# Patient Record
Sex: Female | Born: 1940 | Race: Black or African American | Hispanic: No | Marital: Single | Smoking: Former smoker
Health system: Southern US, Community
[De-identification: ages and names within clinical notes are randomized; demographics above are authoritative.]

## PROBLEM LIST (undated history)

## (undated) DIAGNOSIS — E785 Hyperlipidemia, unspecified: Secondary | ICD-10-CM

## (undated) DIAGNOSIS — I219 Acute myocardial infarction, unspecified: Secondary | ICD-10-CM

## (undated) DIAGNOSIS — I10 Essential (primary) hypertension: Secondary | ICD-10-CM

## (undated) DIAGNOSIS — R911 Solitary pulmonary nodule: Secondary | ICD-10-CM

## (undated) DIAGNOSIS — I2699 Other pulmonary embolism without acute cor pulmonale: Secondary | ICD-10-CM

## (undated) DIAGNOSIS — M1990 Unspecified osteoarthritis, unspecified site: Secondary | ICD-10-CM

## (undated) DIAGNOSIS — D509 Iron deficiency anemia, unspecified: Secondary | ICD-10-CM

## (undated) DIAGNOSIS — K5790 Diverticulosis of intestine, part unspecified, without perforation or abscess without bleeding: Secondary | ICD-10-CM

## (undated) DIAGNOSIS — F5083 Pica in adults: Secondary | ICD-10-CM

## (undated) DIAGNOSIS — N189 Chronic kidney disease, unspecified: Secondary | ICD-10-CM

## (undated) DIAGNOSIS — F5089 Other specified eating disorder: Secondary | ICD-10-CM

## (undated) DIAGNOSIS — G4730 Sleep apnea, unspecified: Secondary | ICD-10-CM

## (undated) DIAGNOSIS — E669 Obesity, unspecified: Secondary | ICD-10-CM

## (undated) DIAGNOSIS — K56609 Unspecified intestinal obstruction, unspecified as to partial versus complete obstruction: Secondary | ICD-10-CM

## (undated) DIAGNOSIS — N261 Atrophy of kidney (terminal): Secondary | ICD-10-CM

## (undated) HISTORY — DX: Diverticulosis of intestine, part unspecified, without perforation or abscess without bleeding: K57.90

## (undated) HISTORY — PX: OTHER SURGICAL HISTORY: SHX169

## (undated) HISTORY — DX: Unspecified intestinal obstruction, unspecified as to partial versus complete obstruction: K56.609

## (undated) HISTORY — DX: Other pulmonary embolism without acute cor pulmonale: I26.99

## (undated) HISTORY — DX: Hyperlipidemia, unspecified: E78.5

## (undated) HISTORY — PX: KIDNEY CYST REMOVAL: SHX684

## (undated) HISTORY — DX: Obesity, unspecified: E66.9

## (undated) HISTORY — DX: Other specified eating disorder: F50.89

## (undated) HISTORY — DX: Atrophy of kidney (terminal): N26.1

## (undated) HISTORY — DX: Essential (primary) hypertension: I10

## (undated) HISTORY — DX: Acute myocardial infarction, unspecified: I21.9

## (undated) HISTORY — DX: Sleep apnea, unspecified: G47.30

## (undated) HISTORY — PX: DILATION AND CURETTAGE OF UTERUS: SHX78

## (undated) HISTORY — DX: Iron deficiency anemia, unspecified: D50.9

## (undated) HISTORY — DX: Solitary pulmonary nodule: R91.1

## (undated) HISTORY — DX: Pica in adults: F50.83

## (undated) HISTORY — PX: TUBAL LIGATION: SHX77

---

## 2000-05-28 ENCOUNTER — Encounter: Admission: RE | Admit: 2000-05-28 | Discharge: 2000-05-28 | Payer: Self-pay | Admitting: Hematology and Oncology

## 2000-06-18 ENCOUNTER — Ambulatory Visit (HOSPITAL_COMMUNITY): Admission: RE | Admit: 2000-06-18 | Discharge: 2000-06-18 | Payer: Self-pay

## 2000-06-30 ENCOUNTER — Ambulatory Visit (HOSPITAL_COMMUNITY): Admission: RE | Admit: 2000-06-30 | Discharge: 2000-06-30 | Payer: Self-pay | Admitting: Internal Medicine

## 2000-07-02 ENCOUNTER — Encounter: Admission: RE | Admit: 2000-07-02 | Discharge: 2000-07-02 | Payer: Self-pay | Admitting: Internal Medicine

## 2001-07-13 ENCOUNTER — Encounter: Payer: Self-pay | Admitting: Internal Medicine

## 2001-07-13 ENCOUNTER — Encounter: Admission: RE | Admit: 2001-07-13 | Discharge: 2001-07-13 | Payer: Self-pay | Admitting: Internal Medicine

## 2001-07-18 ENCOUNTER — Encounter: Admission: RE | Admit: 2001-07-18 | Discharge: 2001-07-18 | Payer: Self-pay | Admitting: Internal Medicine

## 2001-07-18 ENCOUNTER — Encounter: Payer: Self-pay | Admitting: Internal Medicine

## 2001-07-31 ENCOUNTER — Emergency Department (HOSPITAL_COMMUNITY): Admission: EM | Admit: 2001-07-31 | Discharge: 2001-07-31 | Payer: Self-pay | Admitting: *Deleted

## 2001-07-31 ENCOUNTER — Encounter: Payer: Self-pay | Admitting: Emergency Medicine

## 2002-05-07 ENCOUNTER — Observation Stay (HOSPITAL_COMMUNITY): Admission: EM | Admit: 2002-05-07 | Discharge: 2002-05-08 | Payer: Self-pay | Admitting: Urology

## 2002-05-07 ENCOUNTER — Encounter: Payer: Self-pay | Admitting: Urology

## 2002-05-07 ENCOUNTER — Encounter: Payer: Self-pay | Admitting: Emergency Medicine

## 2002-05-08 ENCOUNTER — Encounter: Payer: Self-pay | Admitting: Urology

## 2002-07-14 ENCOUNTER — Encounter: Payer: Self-pay | Admitting: Internal Medicine

## 2002-07-14 ENCOUNTER — Encounter: Admission: RE | Admit: 2002-07-14 | Discharge: 2002-07-14 | Payer: Self-pay | Admitting: Internal Medicine

## 2003-07-17 ENCOUNTER — Encounter: Admission: RE | Admit: 2003-07-17 | Discharge: 2003-07-17 | Payer: Self-pay | Admitting: Internal Medicine

## 2003-12-04 ENCOUNTER — Emergency Department (HOSPITAL_COMMUNITY): Admission: EM | Admit: 2003-12-04 | Discharge: 2003-12-04 | Payer: Self-pay | Admitting: Emergency Medicine

## 2004-01-15 ENCOUNTER — Emergency Department (HOSPITAL_COMMUNITY): Admission: EM | Admit: 2004-01-15 | Discharge: 2004-01-15 | Payer: Self-pay | Admitting: Family Medicine

## 2004-02-26 ENCOUNTER — Encounter: Admission: RE | Admit: 2004-02-26 | Discharge: 2004-02-26 | Payer: Self-pay | Admitting: Internal Medicine

## 2004-03-14 ENCOUNTER — Encounter: Admission: RE | Admit: 2004-03-14 | Discharge: 2004-03-14 | Payer: Self-pay | Admitting: Internal Medicine

## 2004-03-28 ENCOUNTER — Encounter: Admission: RE | Admit: 2004-03-28 | Discharge: 2004-03-28 | Payer: Self-pay | Admitting: Internal Medicine

## 2004-03-28 ENCOUNTER — Ambulatory Visit (HOSPITAL_COMMUNITY): Admission: RE | Admit: 2004-03-28 | Discharge: 2004-03-28 | Payer: Self-pay | Admitting: Internal Medicine

## 2004-05-05 ENCOUNTER — Ambulatory Visit: Payer: Self-pay | Admitting: Internal Medicine

## 2004-05-20 ENCOUNTER — Ambulatory Visit: Payer: Self-pay | Admitting: Internal Medicine

## 2004-05-26 ENCOUNTER — Ambulatory Visit: Payer: Self-pay | Admitting: Internal Medicine

## 2004-08-06 ENCOUNTER — Ambulatory Visit (HOSPITAL_COMMUNITY): Admission: RE | Admit: 2004-08-06 | Discharge: 2004-08-06 | Payer: Self-pay | Admitting: Internal Medicine

## 2004-08-06 ENCOUNTER — Ambulatory Visit: Payer: Self-pay | Admitting: Internal Medicine

## 2004-08-08 ENCOUNTER — Encounter: Admission: RE | Admit: 2004-08-08 | Discharge: 2004-08-08 | Payer: Self-pay | Admitting: Sports Medicine

## 2004-08-13 ENCOUNTER — Ambulatory Visit: Payer: Self-pay | Admitting: Internal Medicine

## 2004-08-15 ENCOUNTER — Ambulatory Visit: Payer: Self-pay | Admitting: Internal Medicine

## 2004-08-20 ENCOUNTER — Ambulatory Visit: Payer: Self-pay | Admitting: Internal Medicine

## 2004-08-25 ENCOUNTER — Ambulatory Visit: Payer: Self-pay | Admitting: Internal Medicine

## 2004-09-25 ENCOUNTER — Observation Stay (HOSPITAL_COMMUNITY): Admission: EM | Admit: 2004-09-25 | Discharge: 2004-09-26 | Payer: Self-pay | Admitting: Emergency Medicine

## 2004-09-25 ENCOUNTER — Ambulatory Visit: Payer: Self-pay | Admitting: Internal Medicine

## 2004-10-07 ENCOUNTER — Ambulatory Visit: Payer: Self-pay | Admitting: Internal Medicine

## 2004-10-22 ENCOUNTER — Ambulatory Visit: Payer: Self-pay | Admitting: Internal Medicine

## 2005-02-06 ENCOUNTER — Ambulatory Visit: Payer: Self-pay | Admitting: Internal Medicine

## 2005-04-27 ENCOUNTER — Ambulatory Visit: Payer: Self-pay | Admitting: Internal Medicine

## 2005-04-27 ENCOUNTER — Encounter (INDEPENDENT_AMBULATORY_CARE_PROVIDER_SITE_OTHER): Payer: Self-pay | Admitting: Internal Medicine

## 2005-04-27 LAB — CONVERTED CEMR LAB: Pap Smear: NORMAL

## 2005-05-07 ENCOUNTER — Ambulatory Visit: Payer: Self-pay | Admitting: Hospitalist

## 2005-08-18 ENCOUNTER — Ambulatory Visit: Payer: Self-pay | Admitting: Internal Medicine

## 2005-10-27 ENCOUNTER — Encounter: Admission: RE | Admit: 2005-10-27 | Discharge: 2005-10-27 | Payer: Self-pay | Admitting: Internal Medicine

## 2005-11-03 ENCOUNTER — Ambulatory Visit: Payer: Self-pay | Admitting: Internal Medicine

## 2005-11-23 ENCOUNTER — Ambulatory Visit: Payer: Self-pay | Admitting: Internal Medicine

## 2005-12-04 ENCOUNTER — Ambulatory Visit: Payer: Self-pay | Admitting: Internal Medicine

## 2005-12-16 ENCOUNTER — Ambulatory Visit: Payer: Self-pay | Admitting: Internal Medicine

## 2006-04-30 ENCOUNTER — Ambulatory Visit: Payer: Self-pay | Admitting: Internal Medicine

## 2006-05-03 ENCOUNTER — Ambulatory Visit: Payer: Self-pay | Admitting: Internal Medicine

## 2006-05-12 ENCOUNTER — Ambulatory Visit: Payer: Self-pay | Admitting: Internal Medicine

## 2006-08-13 ENCOUNTER — Encounter (INDEPENDENT_AMBULATORY_CARE_PROVIDER_SITE_OTHER): Payer: Self-pay | Admitting: Internal Medicine

## 2006-08-13 DIAGNOSIS — I10 Essential (primary) hypertension: Secondary | ICD-10-CM | POA: Insufficient documentation

## 2006-08-13 DIAGNOSIS — E213 Hyperparathyroidism, unspecified: Secondary | ICD-10-CM

## 2006-08-13 DIAGNOSIS — E785 Hyperlipidemia, unspecified: Secondary | ICD-10-CM

## 2006-08-18 ENCOUNTER — Ambulatory Visit (HOSPITAL_COMMUNITY): Admission: RE | Admit: 2006-08-18 | Discharge: 2006-08-18 | Payer: Self-pay | Admitting: Internal Medicine

## 2006-08-18 ENCOUNTER — Ambulatory Visit: Payer: Self-pay | Admitting: Internal Medicine

## 2006-08-18 ENCOUNTER — Encounter: Payer: Self-pay | Admitting: Internal Medicine

## 2006-08-18 ENCOUNTER — Encounter (INDEPENDENT_AMBULATORY_CARE_PROVIDER_SITE_OTHER): Payer: Self-pay | Admitting: Internal Medicine

## 2006-08-18 LAB — CONVERTED CEMR LAB
Basophils Absolute: 0 10*3/uL (ref 0.0–0.1)
Basophils Relative: 0 % (ref 0–1)
Eosinophils Relative: 3 % (ref 0–5)
HCT: 40.1 % (ref 36.0–46.0)
Hemoglobin: 13.2 g/dL (ref 12.0–15.0)
Lymphocytes Relative: 30 % (ref 12–46)
Lymphs Abs: 3.1 10*3/uL (ref 0.7–3.3)
MCHC: 32.9 g/dL (ref 30.0–36.0)
MCV: 92 fL (ref 78.0–100.0)
Monocytes Absolute: 0.8 10*3/uL — ABNORMAL HIGH (ref 0.2–0.7)
Monocytes Relative: 8 % (ref 3–11)
Neutro Abs: 6 10*3/uL (ref 1.7–7.7)
Neutrophils Relative %: 59 % (ref 43–77)
Platelets: 291 10*3/uL (ref 150–400)
RBC: 4.36 M/uL (ref 3.87–5.11)
RDW: 14.1 % — ABNORMAL HIGH (ref 11.5–14.0)
WBC: 10.2 10*3/uL (ref 4.0–10.5)

## 2006-08-19 LAB — CONVERTED CEMR LAB
ALT: 20 units/L (ref 0–35)
AST: 25 units/L (ref 0–37)
Albumin: 4 g/dL (ref 3.5–5.2)
Alkaline Phosphatase: 105 units/L (ref 39–117)
BUN: 6 mg/dL (ref 6–23)
CO2: 31 meq/L (ref 19–32)
Calcium: 10.5 mg/dL (ref 8.4–10.5)
Chloride: 103 meq/L (ref 96–112)
Cholesterol: 244 mg/dL — ABNORMAL HIGH (ref 0–200)
Creatinine, Ser: 0.74 mg/dL (ref 0.40–1.20)
Glucose, Bld: 102 mg/dL — ABNORMAL HIGH (ref 70–99)
HDL: 51 mg/dL (ref >39)
LDL Cholesterol: 175 mg/dL — ABNORMAL HIGH (ref 0–99)
Potassium: 4.2 meq/L (ref 3.5–5.3)
Sodium: 142 meq/L (ref 135–145)
Total Bilirubin: 0.6 mg/dL (ref 0.3–1.2)
Total CHOL/HDL Ratio: 4.8
Total Protein: 7.5 g/dL (ref 6.0–8.3)
Triglycerides: 89 mg/dL (ref <150)
VLDL: 18 mg/dL (ref 0–40)

## 2006-08-23 ENCOUNTER — Ambulatory Visit: Payer: Self-pay | Admitting: Internal Medicine

## 2006-08-23 DIAGNOSIS — R7989 Other specified abnormal findings of blood chemistry: Secondary | ICD-10-CM | POA: Insufficient documentation

## 2006-10-21 ENCOUNTER — Emergency Department (HOSPITAL_COMMUNITY): Admission: EM | Admit: 2006-10-21 | Discharge: 2006-10-21 | Payer: Self-pay | Admitting: Family Medicine

## 2006-11-19 ENCOUNTER — Encounter: Admission: RE | Admit: 2006-11-19 | Discharge: 2006-11-19 | Payer: Self-pay | Admitting: Internal Medicine

## 2007-06-08 ENCOUNTER — Ambulatory Visit: Payer: Self-pay | Admitting: Internal Medicine

## 2007-06-08 ENCOUNTER — Encounter (INDEPENDENT_AMBULATORY_CARE_PROVIDER_SITE_OTHER): Payer: Self-pay | Admitting: *Deleted

## 2007-06-08 LAB — CONVERTED CEMR LAB
Calcium, Total (PTH): 10.8 mg/dL — ABNORMAL HIGH (ref 8.4–10.5)
PTH: 73.2 pg/mL — ABNORMAL HIGH (ref 14.0–72.0)

## 2007-06-13 LAB — CONVERTED CEMR LAB
ALT: 16 units/L (ref 0–35)
AST: 21 units/L (ref 0–37)
Albumin: 4.2 g/dL (ref 3.5–5.2)
Alkaline Phosphatase: 95 units/L (ref 39–117)
Amphetamine Screen, Ur: NEGATIVE
BUN: 14 mg/dL (ref 6–23)
Barbiturate Quant, Ur: NEGATIVE
Basophils Absolute: 0.1 10*3/uL (ref 0.0–0.1)
Basophils Relative: 1 % (ref 0–1)
Benzodiazepines.: NEGATIVE
Bilirubin Urine: NEGATIVE
CO2: 29 meq/L (ref 19–32)
Calcium: 10.4 mg/dL (ref 8.4–10.5)
Chloride: 102 meq/L (ref 96–112)
Cholesterol: 216 mg/dL — ABNORMAL HIGH (ref 0–200)
Cocaine Metabolites: NEGATIVE
Creatinine, Ser: 0.69 mg/dL (ref 0.40–1.20)
Creatinine, Urine: 98.8 mg/dL
Creatinine,U: 97.6 mg/dL
Eosinophils Absolute: 0.2 10*3/uL (ref 0.0–0.7)
Eosinophils Relative: 3 % (ref 0–5)
Ferritin: 425 ng/mL — ABNORMAL HIGH (ref 10–291)
Free T4: 1.28 ng/dL (ref 0.89–1.80)
Glucose, Bld: 80 mg/dL (ref 70–99)
HCT: 43.5 % (ref 36.0–46.0)
HDL: 61 mg/dL (ref >39)
Hemoglobin: 14.1 g/dL (ref 12.0–15.0)
LDL Cholesterol: 142 mg/dL — ABNORMAL HIGH (ref 0–99)
Lymphocytes Relative: 37 % (ref 12–46)
Lymphs Abs: 2.9 10*3/uL (ref 0.7–3.3)
MCHC: 32.4 g/dL (ref 30.0–36.0)
MCV: 92.8 fL (ref 78.0–100.0)
Marijuana Metabolite: NEGATIVE
Methadone: NEGATIVE
Microalb Creat Ratio: 1295.5 mg/g — ABNORMAL HIGH (ref 0.0–30.0)
Microalb, Ur: 128 mg/dL — ABNORMAL HIGH (ref 0.00–1.89)
Monocytes Absolute: 0.6 10*3/uL (ref 0.2–0.7)
Monocytes Relative: 7 % (ref 3–11)
Neutro Abs: 4.1 10*3/uL (ref 1.7–7.7)
Neutrophils Relative %: 53 % (ref 43–77)
Nitrite: POSITIVE — AB
Opiates: NEGATIVE
Phencyclidine (PCP): NEGATIVE
Platelets: 241 10*3/uL (ref 150–400)
Potassium: 4.1 meq/L (ref 3.5–5.3)
Propoxyphene: NEGATIVE
Protein, ur: >300 mg/dL — AB
RBC: 4.69 M/uL (ref 3.87–5.11)
RDW: 14.6 % — ABNORMAL HIGH (ref 11.5–14.0)
Sodium: 141 meq/L (ref 135–145)
Specific Gravity, Urine: 1.02 (ref 1.005–1.03)
TSH: 1.316 microintl units/mL (ref 0.350–5.50)
Total Bilirubin: 0.5 mg/dL (ref 0.3–1.2)
Total CHOL/HDL Ratio: 3.5
Total Protein: 7.3 g/dL (ref 6.0–8.3)
Triglycerides: 67 mg/dL (ref <150)
Urine Glucose: NEGATIVE mg/dL
Urobilinogen, UA: 0.2 (ref 0.0–1.0)
VLDL: 13 mg/dL (ref 0–40)
WBC: 7.8 10*3/uL (ref 4.0–10.5)
pH: 7 (ref 5.0–8.0)

## 2007-06-15 ENCOUNTER — Encounter (INDEPENDENT_AMBULATORY_CARE_PROVIDER_SITE_OTHER): Payer: Self-pay | Admitting: *Deleted

## 2007-06-15 ENCOUNTER — Ambulatory Visit: Payer: Self-pay | Admitting: Internal Medicine

## 2007-10-22 ENCOUNTER — Encounter: Payer: Self-pay | Admitting: Internal Medicine

## 2008-01-27 ENCOUNTER — Encounter: Admission: RE | Admit: 2008-01-27 | Discharge: 2008-01-27 | Payer: Self-pay | Admitting: Internal Medicine

## 2008-01-31 ENCOUNTER — Encounter: Payer: Self-pay | Admitting: Internal Medicine

## 2008-02-07 ENCOUNTER — Ambulatory Visit: Payer: Self-pay | Admitting: *Deleted

## 2008-02-07 ENCOUNTER — Ambulatory Visit (HOSPITAL_COMMUNITY): Admission: RE | Admit: 2008-02-07 | Discharge: 2008-02-07 | Payer: Self-pay | Admitting: *Deleted

## 2008-02-07 ENCOUNTER — Encounter: Payer: Self-pay | Admitting: Internal Medicine

## 2008-02-21 ENCOUNTER — Ambulatory Visit: Payer: Self-pay | Admitting: Internal Medicine

## 2008-06-26 ENCOUNTER — Telehealth: Payer: Self-pay | Admitting: Internal Medicine

## 2008-08-31 ENCOUNTER — Encounter: Payer: Self-pay | Admitting: Internal Medicine

## 2008-08-31 ENCOUNTER — Ambulatory Visit: Payer: Self-pay | Admitting: Internal Medicine

## 2008-09-03 LAB — CONVERTED CEMR LAB
ALT: 14 U/L
AST: 19 U/L
Albumin: 4.1 g/dL
Alkaline Phosphatase: 91 U/L
BUN: 12 mg/dL
CO2: 26 meq/L
Calcium: 10.3 mg/dL
Chloride: 101 meq/L
Cholesterol: 231 mg/dL — ABNORMAL HIGH
Creatinine, Ser: 0.74 mg/dL
Creatinine, Urine: 182.8 mg/dL
Glucose, Bld: 95 mg/dL
HDL: 52 mg/dL
LDL Cholesterol: 154 mg/dL — ABNORMAL HIGH
Microalb Creat Ratio: 842.5 mg/g — ABNORMAL HIGH
Microalb, Ur: 154 mg/dL — ABNORMAL HIGH
Potassium: 4.1 meq/L
Sodium: 141 meq/L
Total Bilirubin: 0.5 mg/dL
Total CHOL/HDL Ratio: 4.4
Total Protein: 7.2 g/dL
Triglycerides: 123 mg/dL
VLDL: 25 mg/dL

## 2008-11-06 ENCOUNTER — Ambulatory Visit: Payer: Self-pay | Admitting: Internal Medicine

## 2008-11-06 ENCOUNTER — Encounter: Payer: Self-pay | Admitting: Internal Medicine

## 2008-11-12 ENCOUNTER — Ambulatory Visit: Payer: Self-pay | Admitting: Gastroenterology

## 2008-11-27 ENCOUNTER — Ambulatory Visit: Payer: Self-pay | Admitting: Gastroenterology

## 2008-11-27 LAB — HM COLONOSCOPY

## 2008-12-11 ENCOUNTER — Ambulatory Visit: Payer: Self-pay | Admitting: Internal Medicine

## 2009-01-21 ENCOUNTER — Telehealth: Payer: Self-pay | Admitting: Internal Medicine

## 2009-01-29 ENCOUNTER — Encounter: Admission: RE | Admit: 2009-01-29 | Discharge: 2009-01-29 | Payer: Self-pay | Admitting: Internal Medicine

## 2009-02-18 ENCOUNTER — Telehealth: Payer: Self-pay | Admitting: Internal Medicine

## 2009-03-26 ENCOUNTER — Telehealth: Payer: Self-pay | Admitting: Internal Medicine

## 2009-04-18 ENCOUNTER — Ambulatory Visit: Payer: Self-pay | Admitting: Internal Medicine

## 2009-04-18 LAB — CONVERTED CEMR LAB
ALT: 17 units/L (ref 0–35)
AST: 23 units/L (ref 0–37)
Albumin: 4.3 g/dL (ref 3.5–5.2)
Alkaline Phosphatase: 111 units/L (ref 39–117)
BUN: 9 mg/dL (ref 6–23)
CO2: 26 meq/L (ref 19–32)
Calcium: 10.3 mg/dL (ref 8.4–10.5)
Chloride: 103 meq/L (ref 96–112)
Cholesterol: 203 mg/dL — ABNORMAL HIGH (ref 0–200)
Creatinine, Ser: 0.7 mg/dL (ref 0.40–1.20)
Glucose, Bld: 99 mg/dL (ref 70–99)
HCT: 44.9 % (ref 36.0–46.0)
HDL: 58 mg/dL (ref >39)
Hemoglobin: 14.2 g/dL (ref 12.0–15.0)
LDL Cholesterol: 132 mg/dL — ABNORMAL HIGH (ref 0–99)
MCHC: 31.6 g/dL (ref 30.0–36.0)
MCV: 94.1 fL (ref >=78.0)
Platelets: 288 10*3/uL (ref 150–400)
Potassium: 4.4 meq/L (ref 3.5–5.3)
RBC: 4.77 M/uL (ref 3.87–5.11)
RDW: 14.5 % (ref 11.5–15.5)
Sodium: 143 meq/L (ref 135–145)
TSH: 2.027 microintl units/mL (ref 0.350–4.5)
Total Bilirubin: 0.5 mg/dL (ref 0.3–1.2)
Total CHOL/HDL Ratio: 3.5
Total Protein: 7.6 g/dL (ref 6.0–8.3)
Triglycerides: 66 mg/dL (ref <150)
VLDL: 13 mg/dL (ref 0–40)
WBC: 8.7 10*3/uL (ref 4.0–10.5)

## 2009-04-24 ENCOUNTER — Ambulatory Visit: Payer: Self-pay | Admitting: Internal Medicine

## 2009-04-24 LAB — CONVERTED CEMR LAB
BUN: 10 mg/dL (ref 6–23)
CO2: 30 meq/L (ref 19–32)
Calcium: 10.3 mg/dL (ref 8.4–10.5)
Chloride: 103 meq/L (ref 96–112)
Creatinine, Ser: 0.71 mg/dL (ref 0.40–1.20)
Glucose, Bld: 114 mg/dL — ABNORMAL HIGH (ref 70–99)
Potassium: 4.4 meq/L (ref 3.5–5.3)
Sodium: 144 meq/L (ref 135–145)

## 2009-05-07 ENCOUNTER — Ambulatory Visit: Payer: Self-pay | Admitting: Internal Medicine

## 2009-05-07 LAB — CONVERTED CEMR LAB
BUN: 7 mg/dL (ref 6–23)
CO2: 30 meq/L (ref 19–32)
Calcium: 10.4 mg/dL (ref 8.4–10.5)
Chloride: 102 meq/L (ref 96–112)
Creatinine, Ser: 0.74 mg/dL (ref 0.40–1.20)
Glucose, Bld: 120 mg/dL — ABNORMAL HIGH (ref 70–99)
Potassium: 4.3 meq/L (ref 3.5–5.3)
Sodium: 143 meq/L (ref 135–145)

## 2009-05-17 ENCOUNTER — Ambulatory Visit (HOSPITAL_COMMUNITY): Admission: RE | Admit: 2009-05-17 | Discharge: 2009-05-17 | Payer: Self-pay | Admitting: Internal Medicine

## 2009-05-17 ENCOUNTER — Encounter: Payer: Self-pay | Admitting: Internal Medicine

## 2009-05-24 ENCOUNTER — Telehealth: Payer: Self-pay | Admitting: Internal Medicine

## 2009-06-04 ENCOUNTER — Ambulatory Visit: Payer: Self-pay | Admitting: Infectious Disease

## 2009-06-05 ENCOUNTER — Encounter: Payer: Self-pay | Admitting: Infectious Disease

## 2009-06-05 LAB — CONVERTED CEMR LAB: Vit D, 25-Hydroxy: 22 ng/mL — ABNORMAL LOW (ref 30–89)

## 2009-06-10 ENCOUNTER — Ambulatory Visit: Payer: Self-pay | Admitting: Internal Medicine

## 2009-06-10 ENCOUNTER — Encounter: Payer: Self-pay | Admitting: Internal Medicine

## 2009-06-10 ENCOUNTER — Encounter (INDEPENDENT_AMBULATORY_CARE_PROVIDER_SITE_OTHER): Payer: Self-pay | Admitting: Dermatology

## 2009-06-11 ENCOUNTER — Encounter (INDEPENDENT_AMBULATORY_CARE_PROVIDER_SITE_OTHER): Payer: Self-pay | Admitting: Dermatology

## 2009-06-11 LAB — CONVERTED CEMR LAB
Calcium, Total (PTH): 10.5 mg/dL (ref 8.4–10.5)
PTH: 94.1 pg/mL — ABNORMAL HIGH (ref 14.0–72.0)

## 2009-06-24 ENCOUNTER — Telehealth (INDEPENDENT_AMBULATORY_CARE_PROVIDER_SITE_OTHER): Payer: Self-pay | Admitting: *Deleted

## 2009-07-01 ENCOUNTER — Ambulatory Visit: Payer: Self-pay | Admitting: Internal Medicine

## 2009-07-23 ENCOUNTER — Telehealth (INDEPENDENT_AMBULATORY_CARE_PROVIDER_SITE_OTHER): Payer: Self-pay | Admitting: *Deleted

## 2009-07-25 ENCOUNTER — Ambulatory Visit: Payer: Self-pay | Admitting: Internal Medicine

## 2009-07-25 LAB — CONVERTED CEMR LAB
BUN: 11 mg/dL (ref 6–23)
CO2: 27 meq/L (ref 19–32)
Calcium: 10.8 mg/dL — ABNORMAL HIGH (ref 8.4–10.5)
Chloride: 102 meq/L (ref 96–112)
Creatinine, Ser: 0.72 mg/dL (ref 0.40–1.20)
Glucose, Bld: 121 mg/dL — ABNORMAL HIGH (ref 70–99)
Potassium: 4.1 meq/L (ref 3.5–5.3)
Sodium: 141 meq/L (ref 135–145)

## 2009-08-09 ENCOUNTER — Ambulatory Visit: Payer: Self-pay | Admitting: Internal Medicine

## 2009-08-22 ENCOUNTER — Ambulatory Visit: Payer: Self-pay | Admitting: Internal Medicine

## 2009-09-23 ENCOUNTER — Ambulatory Visit: Payer: Self-pay | Admitting: Internal Medicine

## 2009-09-24 ENCOUNTER — Telehealth: Payer: Self-pay | Admitting: Internal Medicine

## 2009-10-11 ENCOUNTER — Ambulatory Visit: Payer: Self-pay | Admitting: Infectious Diseases

## 2009-10-11 LAB — CONVERTED CEMR LAB
OCCULT 1: NEGATIVE
OCCULT 2: NEGATIVE
OCCULT 3: NEGATIVE

## 2009-11-19 ENCOUNTER — Ambulatory Visit: Payer: Self-pay | Admitting: Internal Medicine

## 2009-11-19 DIAGNOSIS — R7309 Other abnormal glucose: Secondary | ICD-10-CM | POA: Insufficient documentation

## 2009-11-20 LAB — CONVERTED CEMR LAB
BUN: 11 mg/dL (ref 6–23)
CO2: 32 meq/L (ref 19–32)
Calcium: 10 mg/dL (ref 8.4–10.5)
Chloride: 102 meq/L (ref 96–112)
Cholesterol: 180 mg/dL (ref 0–200)
Creatinine, Ser: 0.72 mg/dL (ref 0.40–1.20)
Glucose, Bld: 117 mg/dL — ABNORMAL HIGH (ref 70–99)
HDL: 48 mg/dL (ref >39)
LDL Cholesterol: 111 mg/dL — ABNORMAL HIGH (ref 0–99)
Potassium: 4.2 meq/L (ref 3.5–5.3)
Sodium: 141 meq/L (ref 135–145)
Total CHOL/HDL Ratio: 3.8
Triglycerides: 105 mg/dL (ref <150)
VLDL: 21 mg/dL (ref 0–40)

## 2009-11-25 ENCOUNTER — Telehealth: Payer: Self-pay | Admitting: Internal Medicine

## 2009-12-11 ENCOUNTER — Ambulatory Visit: Payer: Self-pay | Admitting: Internal Medicine

## 2009-12-25 ENCOUNTER — Ambulatory Visit: Payer: Self-pay | Admitting: Internal Medicine

## 2010-01-08 ENCOUNTER — Ambulatory Visit: Payer: Self-pay | Admitting: Internal Medicine

## 2010-01-08 LAB — CONVERTED CEMR LAB
BUN: 10 mg/dL (ref 6–23)
CO2: 30 meq/L (ref 19–32)
Calcium: 10.6 mg/dL — ABNORMAL HIGH (ref 8.4–10.5)
Chloride: 101 meq/L (ref 96–112)
Creatinine, Ser: 0.68 mg/dL (ref 0.40–1.20)

## 2010-01-30 ENCOUNTER — Encounter: Admission: RE | Admit: 2010-01-30 | Discharge: 2010-01-30 | Payer: Self-pay | Admitting: Internal Medicine

## 2010-01-30 LAB — HM MAMMOGRAPHY: HM Mammogram: NEGATIVE

## 2010-03-24 ENCOUNTER — Telehealth: Payer: Self-pay | Admitting: Internal Medicine

## 2010-06-02 ENCOUNTER — Ambulatory Visit: Payer: Self-pay | Admitting: Internal Medicine

## 2010-06-25 ENCOUNTER — Telehealth (INDEPENDENT_AMBULATORY_CARE_PROVIDER_SITE_OTHER): Payer: Self-pay | Admitting: *Deleted

## 2010-08-31 LAB — CONVERTED CEMR LAB
Calcium, Total (PTH): 10.6 mg/dL — ABNORMAL HIGH (ref 8.4–10.5)
Cholesterol: 255 mg/dL — ABNORMAL HIGH (ref 0–200)
HDL: 56 mg/dL (ref >39)
LDL Cholesterol: 183 mg/dL — ABNORMAL HIGH (ref 0–99)
PTH: 90.7 pg/mL — ABNORMAL HIGH (ref 14.0–72.0)
Total CHOL/HDL Ratio: 4.6
Triglycerides: 82 mg/dL (ref <150)
VLDL: 16 mg/dL (ref 0–40)

## 2010-09-02 NOTE — Assessment & Plan Note (Signed)
Summary: 2WK RECK BP/MAGICK/VS   Vital Signs:  Patient profile:   70 year old female Height:      62.5 inches (158.75 cm) Weight:      313.0 pounds (142.27 kg) BMI:     56.54 Temp:     97.0 degrees F (36.11 degrees C) oral Pulse rate:   95 / minute BP sitting:   164 / 97  (right arm)  Vitals Entered By: Stanton Kidney Ditzler RN (August 22, 2009 8:44 AM) CC: Depression Is Patient Diabetic? No Pain Assessment Patient in pain? no      Nutritional Status BMI of > 30 = obese Nutritional Status Detail appetite good  Have you ever been in a relationship where you felt threatened, hurt or afraid?denies   Does patient need assistance? Functional Status Self care Ambulation Normal Comments FU BP.   Primary Care Provider:  Mliss Sax MD  CC:  Depression.  History of Present Illness: This is a  year old woman with past medical history of   Hyperlipidemia Hypertension Obesity Hypercalcemia, resolved off of HCTZ   She is here today for blood pressure recheck.  We have recently tapered her off of clonidine due to side effects and have started IMDUR. BP today is 164/97.  She has no other complaints.  Still feeling better since being off of clonidine.  Depression History:      The patient denies a depressed mood most of the day and a diminished interest in her usual daily activities.         Preventive Screening-Counseling & Management  Alcohol-Tobacco     Alcohol drinks/day: 0     Smoking Status: quit     Year Quit: >40 yrs ago     Passive Smoke Exposure: yes  Caffeine-Diet-Exercise     Caffeine use/day: 1     Does Patient Exercise: yes     Type of exercise: WALKS     Exercise (avg: min/session):   30     Times/week: 1  Medications Prior to Update: 1)  Furosemide 20 Mg  Tabs (Furosemide) .... Take 1 Tablet By Mouth Twice A Day 2)  Pravachol 20 Mg Tabs (Pravastatin Sodium) .... Take 1 Tablet By Mouth Once A Day 3)  Norvasc 10 Mg Tabs (Amlodipine Besylate) .... Take 1  Tablet By Mouth Once A Day 4)  Ventolin Hfa 108 (90 Base) Mcg/act Aers (Albuterol Sulfate) .... 2 Puffs Inhaled Every 4-6 Hours As Needed For Wheezing 5)  Caltrate 600+d Plus 600-400 Mg-Unit Tabs (Calcium Carbonate-Vit D-Min) .... Take 1 Tablet By Mouth Once A Day. 6)  Catapres 0.2 Mg Tabs (Clonidine Hcl) .... Take One Tablet Two Times A Day For One Week. 7)  Imdur 30 Mg Xr24h-Tab (Isosorbide Mononitrate) .... Take One Tablet Daily For Blood Pressure. 8)  Catapres 0.1 Mg Tabs (Clonidine Hcl) .... Take One Tablet Two Times A Day For One Week, Then One Tablet Daily For One Week and Then Stop.  Allergies: 1)  ! Asa 2)  ! Lisinopril 3)  ! * Crab But Not Shrimp  Review of Systems       per hpi  Physical Exam  General:  alert and overweight-appearing.     Impression & Recommendations:  Problem # 1:  HYPERTENSION (ICD-401.9) Fells better off clonidine.  BP still above goal on Norvasc 10, lasix 20 and Imdur 30. Will increase imdur to 60 and she will rtc on one month.   The following medications were removed from the medication list:  Catapres 0.2 Mg Tabs (Clonidine hcl) .Marland Kitchen... Take one tablet two times a day for one week.    Catapres 0.1 Mg Tabs (Clonidine hcl) .Marland Kitchen... Take one tablet two times a day for one week, then one tablet daily for one week and then stop. Her updated medication list for this problem includes:    Furosemide 20 Mg Tabs (Furosemide) .Marland Kitchen... Take 1 tablet by mouth twice a day    Norvasc 10 Mg Tabs (Amlodipine besylate) .Marland Kitchen... Take 1 tablet by mouth once a day  Problem # 2:  OBESITY (ICD-278.00) She has lost 3lbs and is very happy about this.  She will continue to try to cut down on extra calories and increase exercise.  Complete Medication List: 1)  Furosemide 20 Mg Tabs (Furosemide) .... Take 1 tablet by mouth twice a day 2)  Pravachol 20 Mg Tabs (Pravastatin sodium) .... Take 1 tablet by mouth once a day 3)  Norvasc 10 Mg Tabs (Amlodipine besylate) .... Take 1  tablet by mouth once a day 4)  Ventolin Hfa 108 (90 Base) Mcg/act Aers (Albuterol sulfate) .... 2 puffs inhaled every 4-6 hours as needed for wheezing 5)  Caltrate 600+d Plus 600-400 Mg-unit Tabs (Calcium carbonate-vit d-min) .... Take 1 tablet by mouth once a day. 6)  Imdur 60 Mg Xr24h-tab (Isosorbide mononitrate) .... Take one tablet daily for blood pressure.  Patient Instructions: 1)  Please increase your dose of imdur to 60mg  daily.  You can take 2 tablets of the 30mg  pills until you run out.  Your refill will be 60mg  tablets. 2)  Please schedule a follow-up appointment in 1 month for blood pressure recheck. Prescriptions: IMDUR 60 MG XR24H-TAB (ISOSORBIDE MONONITRATE) Take one tablet daily for blood pressure.  #32 x 0   Entered and Authorized by:   Elby Showers MD   Signed by:   Elby Showers MD on 08/22/2009   Method used:   [facility] to        [facility] [address] [id number]* (retail)       [address]       [place], [place]  [address]       Ph: [phone] or [phone]       Fax: [phone]   RxID:   [id number]    Prevention & Chronic Care Immunizations   Influenza vaccine: Not documented   Influenza vaccine deferral: Deferred  (05/07/2009)    Tetanus booster: Not documented    Pneumococcal vaccine: Pneumovax (Medicare)  (07/01/2009)    H. zoster vaccine: Not documented  Colorectal Screening   Hemoccult: Negative  (05/12/2006)    Colonoscopy:  Mild diverticulosis was found in the sigmoid colon.  This was otherwise a normal examination of the colon.   Retroflexed views in the rectum revealed no abnormalities.    The time to cecum =  1.5  minutes. The scope was then withdrawn (time =  7.5  min) from the patient and the procedure completed.  (11/27/2008)   Colonoscopy due: 12/2018  Other Screening   Pap smear:  Specimen Adequacy: Satisfactory for evaluation.   Interpretation/Result:Negative for intraepithelial Lesion or  Malignancy.     (11/06/2008)   Pap smear due: 11/2011    Mammogram: No specific mammographic evidence of malignancy.  No significant changes compared to previous study.  Assessment: BIRADS 1.   (01/29/2009)   Mammogram due: 01/2010    DXA bone density scan: Not documented   DXA bone density action/deferral:  Ordered  (04/18/2009)   Smoking status: quit  (08/22/2009)  Lipids   Total Cholesterol: 203  (04/18/2009)   Lipid panel action/deferral: Lipid Panel ordered   LDL: 132  (04/18/2009)   LDL Direct: Not documented   HDL: 58  (04/18/2009)   Triglycerides: 66  (04/18/2009)    SGOT (AST): 23  (04/18/2009)   BMP action: Ordered   SGPT (ALT): 17  (04/18/2009)   Alkaline phosphatase: 111  (04/18/2009)   Total bilirubin: 0.5  (04/18/2009)    Lipid flowsheet reviewed?: Yes   Progress toward LDL goal: Unchanged  Hypertension   Last Blood Pressure: 164 / 97  (08/22/2009)   Serum creatinine: 0.72  (07/25/2009)   BMP action: Ordered   Serum potassium 4.1  (07/25/2009)    Hypertension flowsheet reviewed?: Yes   Progress toward BP goal: Deteriorated  Self-Management Support :   Personal Goals (by the next clinic visit) :      Personal blood pressure goal: 140/90  (04/18/2009)     Personal LDL goal: 130  (04/18/2009)    Patient will work on the following items until the next clinic visit to reach self-care goals:     Medications and monitoring: take my medicines every day, check my blood pressure, bring all of my medications to every visit  (08/22/2009)     Eating: drink diet soda or water instead of juice or soda, eat more vegetables, use fresh or frozen vegetables, eat foods that are low in salt, eat baked foods instead of fried foods, eat fruit for snacks and desserts, limit or avoid alcohol  (08/22/2009)     Activity: take a 30 minute walk every day, park at the far end of the parking lot  (08/22/2009)    Hypertension self-management support: Written self-care plan   (07/01/2009)    Lipid self-management support: Written self-care plan  (07/01/2009)

## 2010-09-02 NOTE — Assessment & Plan Note (Signed)
Summary: EST-2 WEEK RECHECK/CH   Vital Signs:  Patient profile:   70 year old female Height:      62.5 inches (158.75 cm) Weight:      311.04 pounds (141.38 kg) BMI:     56.19 Temp:     98.4 degrees F (36.89 degrees C) oral Pulse rate:   104 / minute BP sitting:   156 / 92  (left arm)  Vitals Entered By: Angelina Ok RN (Dec 11, 2009 1:36 PM) Is Patient Diabetic? No Pain Assessment Patient in pain? no      Nutritional Status BMI of > 30 = obese  Have you ever been in a relationship where you felt threatened, hurt or afraid?No   Does patient need assistance? Functional Status Self care Ambulation Normal Comments New medicine makes her itch.  Started at first dose.  Makes her heart flutter.  Results of labs.     Primary Care Provider:  Mliss Sax MD   History of Present Illness: 70 yo female wih PMH outlined below presents to [facility] for regular follow up appointment. She has no concerns at the time. No recent sicknesses or hospitalizaitons. No episodes of chest pain, SOB, palpitations. No specific abdominal or urinary concerns. No recent changes in appetite, weight, sleep patterns, mood.   Tells me she gets itching from taking hydralazine, so she has been avoiding the medicine. She is compliant with the rest of the meds.   Depression History:      The patient denies a depressed mood most of the day and a diminished interest in her usual daily activities.  The patient denies significant weight loss, significant weight gain, insomnia, hypersomnia, psychomotor agitation, psychomotor retardation, fatigue (loss of energy), feelings of worthlessness (guilt), impaired concentration (indecisiveness), and recurrent thoughts of death or suicide.        The patient denies that she feels like life is not worth living, denies that she wishes that she were dead, and denies that she has thought about ending her life.         Preventive Screening-Counseling &  Management  Alcohol-Tobacco     Alcohol drinks/day: 0     Smoking Status: quit     Year Quit: >40 yrs ago     Passive Smoke Exposure: yes  Problems Prior to Update: 1)  Hyperglycemia  (ICD-790.29) 2)  Cerumen Impaction, Bilateral  (ICD-380.4) 3)  Preventive Health Care  (ICD-V70.0) 4)  Hip Pain, Left  (ICD-719.45) 5)  Fasting Hyperglycemia  (ICD-790.6) 6)  Hypertension  (ICD-401.9) 7)  Hyperparathyroidism Nos  (ICD-252.00) 8)  Hypercalcemia  (ICD-275.42) 9)  Obesity  (ICD-278.00) 10)  Hyperlipidemia  (ICD-272.4)  Medications Prior to Update: 1)  Furosemide 20 Mg  Tabs (Furosemide) .... Take 1 Tablet By Mouth Twice A Day 2)  Pravachol 20 Mg Tabs (Pravastatin Sodium) .... Take 1 Tablet By Mouth Once A Day 3)  Norvasc 10 Mg Tabs (Amlodipine Besylate) .... Take 1 Tablet By Mouth Once A Day 4)  Ventolin Hfa 108 (90 Base) Mcg/act Aers (Albuterol Sulfate) .... 2 Puffs Inhaled Every 4-6 Hours As Needed For Wheezing 5)  Caltrate 600+d Plus 600-400 Mg-Unit Tabs (Calcium Carbonate-Vit D-Min) .... Take 1 Tablet By Mouth Once A Day. 6)  Imdur 60 Mg Xr24h-Tab (Isosorbide Mononitrate) .... Take One Tablet Daily For Blood Pressure. 7)  Hydralazine Hcl 10 Mg Tabs (Hydralazine Hcl) .... Take 1 Tablet By Mouth Two Times A Day  Current Medications (verified): 1)  Furosemide 20 Mg  Tabs (Furosemide) .Marland KitchenMarland KitchenMarland Kitchen  Take 1 Tablet By Mouth Twice A Day 2)  Pravachol 20 Mg Tabs (Pravastatin Sodium) .... Take 1 Tablet By Mouth Once A Day 3)  Norvasc 10 Mg Tabs (Amlodipine Besylate) .... Take 1 Tablet By Mouth Once A Day 4)  Ventolin Hfa 108 (90 Base) Mcg/act Aers (Albuterol Sulfate) .... 2 Puffs Inhaled Every 4-6 Hours As Needed For Wheezing 5)  Caltrate 600+d Plus 600-400 Mg-Unit Tabs (Calcium Carbonate-Vit D-Min) .... Take 1 Tablet By Mouth Once A Day. 6)  Imdur 60 Mg Xr24h-Tab (Isosorbide Mononitrate) .... Take One Tablet Daily For Blood Pressure. 7)  Hydralazine Hcl 10 Mg Tabs (Hydralazine Hcl) .... Take 1 Tablet  By Mouth Two Times A Day  Allergies: 1)  ! Asa 2)  ! Lisinopril 3)  ! * Crab But Not Shrimp 4)  ! * Hydralazine  Past History:  Past Medical History: Last updated: 08/13/2006 Hyperlipidemia Hypertension Obesity Hypercalcemia, resolved off of HCTZ  Family History: Last updated: 02/07/2008 father: heart attack at 60 mother: HTN, LVH brother: heart attck at 38  Social History: Last updated: 08/31/2008 lives alone, recently retired Lawyer (retired 4 months ago)  Risk Factors: Alcohol Use: 0 (12/11/2009) Caffeine Use: 1 (11/19/2009) Exercise: yes (11/19/2009)  Risk Factors: Smoking Status: quit (12/11/2009) Passive Smoke Exposure: yes (12/11/2009)  Family History: Reviewed history from 02/07/2008 and no changes required. father: heart attack at 38 mother: HTN, LVH brother: heart attck at 36  Social History: Reviewed history from 08/31/2008 and no changes required. lives alone, recently retired Lawyer (retired 4 months ago)  Review of Systems       per HPI  Physical Exam  General:  Well-developed,well-nourished,in no acute distress; alert,appropriate and cooperative throughout examination Lungs:  Normal respiratory effort, chest expands symmetrically. Lungs are clear to auscultation, no crackles or wheezes. Heart:  Normal rate and regular rhythm. S1 and S2 normal without gallop, murmur, click, rub or other extra sounds. Neurologic:  No cranial nerve deficits noted. Station and gait are normal. Plantar reflexes are down-going bilaterally. DTRs are symmetrical throughout. Sensory, motor and coordinative functions appear intact. Psych:  Cognition and judgment appear intact. Alert and cooperative with normal attention span and concentration. No apparent delusions, illusions, hallucinations   Impression & Recommendations:  Problem # 1:  HYPERGLYCEMIA (ICD-790.29) Last A1C 5.6. Pt is not diabetic and is not on any meds. Continue to periodicaly assess based on symptoms.   Labs Reviewed: Creat: 0.72 (11/19/2009)     Problem # 2:  HYPERTENSION (ICD-401.9) Improved but still above the goal. I think that hydralazine might be causing her to itch so will d/c the med but will increase the imdur to 120 mg once daily and will monitor the BP and reassess in 1-2 weeks. I have asked her to monitor her BP as well and to call us back sooner if she experiences any new symptoms. Max dose of imdur is 240 once daily on the long dose.  The following medications were removed from the medication list:    Hydralazine Hcl 10 Mg Tabs (Hydralazine hcl) .Marland Kitchen... Take 1 tablet by mouth two times a day Her updated medication list for this problem includes:    Furosemide 20 Mg Tabs (Furosemide) .Marland Kitchen... Take 1 tablet by mouth twice a day    Norvasc 10 Mg Tabs (Amlodipine besylate) .Marland Kitchen... Take 1 tablet by mouth once a day  BP today: 156/92 Prior BP: 162/102 (11/19/2009)  Prior 10 Yr Risk Heart Disease: 15 % (06/04/2009)  Labs Reviewed: K+: 4.2 (11/19/2009) Creat: :  0.72 (11/19/2009)   Chol: 180 (11/19/2009)   HDL: 48 (11/19/2009)   LDL: 111 (11/19/2009)   TG: 105 (11/19/2009)  Problem # 3:  HYPERCALCEMIA (ICD-275.42) Ca on 11/2009 was 10 which is down from 10.8. Will cont to monitor.   Problem # 4:  OBESITY (ICD-278.00) Has lost 2 lbs. WIll encourage more weight loss. Ht: 62.5 (12/11/2009)   Wt: 311.04 (12/11/2009)   BMI: 56.19 (12/11/2009)  Problem # 5:  HYPERLIPIDEMIA (ICD-272.4) Well controlled, cont the same regimen.  Her updated medication list for this problem includes:    Pravachol 20 Mg Tabs (Pravastatin sodium) .Marland Kitchen... Take 1 tablet by mouth once a day  Labs Reviewed: SGOT: 23 (04/18/2009)   SGPT: 17 (04/18/2009)  Prior 10 Yr Risk Heart Disease: 15 % (06/04/2009)   HDL:48 (11/19/2009), 58 (04/18/2009)  LDL:111 (11/19/2009), 132 (04/18/2009)  Chol:180 (11/19/2009), 203 (04/18/2009)  Trig:105 (11/19/2009), 66 (04/18/2009)  Complete Medication List: 1)  Furosemide 20 Mg Tabs  (Furosemide) .... Take 1 tablet by mouth twice a day 2)  Pravachol 20 Mg Tabs (Pravastatin sodium) .... Take 1 tablet by mouth once a day 3)  Norvasc 10 Mg Tabs (Amlodipine besylate) .... Take 1 tablet by mouth once a day 4)  Ventolin Hfa 108 (90 Base) Mcg/act Aers (Albuterol sulfate) .... 2 puffs inhaled every 4-6 hours as needed for wheezing 5)  Caltrate 600+d Plus 600-400 Mg-unit Tabs (Calcium carbonate-vit d-min) .... Take 1 tablet by mouth once a day. 6)  Imdur 120 Mg Xr24h-tab (Isosorbide mononitrate) .... Take 1 tablet by mouth once a day  Patient Instructions: 1)  Please schedule a follow-up appointment in 2 weeks. 2)  Please check your blood pressure regularly, if it is >170 please call clinic at [phone] Prescriptions: IMDUR 120 MG XR24H-TAB (ISOSORBIDE MONONITRATE) Take 1 tablet by mouth once a day  #30 x 3   Entered and Authorized by:   Mliss Sax MD   Signed by:   Mliss Sax MD on 12/11/2009   Method used:   [facility]        [facility] [address] [id number][facility] (retail)       [address]       [place], [place]  [address]       Ph: [phone] or [phone]       Fax: [phone]   RxID:   [id number]   Prevention & Chronic Care Immunizations   Influenza vaccine: Not documented   Influenza vaccine deferral: Not indicated  (11/19/2009)   Influenza vaccine due: 04/04/2011    Tetanus booster: 09/23/2009: Td   Tetanus booster due: 09/24/2019    Pneumococcal vaccine: Pneumovax (Medicare)  (07/01/2009)   Pneumococcal vaccine due: 07/01/2014    H. zoster vaccine: Not documented   H. zoster vaccine deferral: Not indicated  (11/19/2009)  Colorectal Screening   Hemoccult: Negative  (05/12/2006)   Hemoccult action/deferral: Not indicated  (11/19/2009)    Colonoscopy:  Mild diverticulosis was found in the sigmoid colon.  This was otherwise a normal examination of the colon.   Retroflexed views in the rectum revealed no abnormalities.     The time to cecum =  1.5  minutes. The scope was then withdrawn (time =  7.5  min) from the patient and the procedure completed.  (11/27/2008)   Colonoscopy due: 12/2018  Other Screening   Pap smear:  Specimen Adequacy: Satisfactory for evaluation.   Interpretation/Result:Negative for intraepithelial Lesion or Malignancy.     (  11/06/2008)   Pap smear action/deferral: Deferred-3 yr interval  (11/19/2009)   Pap smear due: 11/2011    Mammogram: No specific mammographic evidence of malignancy.  No significant changes compared to previous study.  Assessment: BIRADS 1.   (01/29/2009)   Mammogram due: 01/2010    DXA bone density scan: Not documented   DXA bone density action/deferral: Not indicated  (11/19/2009)   Smoking status: quit  (12/11/2009)  Lipids   Total Cholesterol: 180  (11/19/2009)   Lipid panel action/deferral: Lipid Panel ordered   LDL: 111  (11/19/2009)   LDL Direct: Not documented   HDL: 48  (11/19/2009)   Triglycerides: 105  (11/19/2009)    SGOT (AST): 23  (04/18/2009)   BMP action: Ordered   SGPT (ALT): 17  (04/18/2009)   Alkaline phosphatase: 111  (04/18/2009)   Total bilirubin: 0.5  (04/18/2009)    Lipid flowsheet reviewed?: Yes   Progress toward LDL goal: At goal  Hypertension   Last Blood Pressure: 156 / 92  (12/11/2009)   Serum creatinine: 0.72  (11/19/2009)   BMP action: Ordered   Serum potassium 4.2  (11/19/2009)    Hypertension flowsheet reviewed?: Yes   Progress toward BP goal: Improved  Self-Management Support :   Personal Goals (by the next clinic visit) :      Personal blood pressure goal: 140/90  (04/18/2009)     Personal LDL goal: 130  (04/18/2009)    Patient will work on the following items until the next clinic visit to reach self-care goals:     Medications and monitoring: take my medicines every day, bring all of my medications to every visit  (12/11/2009)     Eating: drink diet soda or water instead of juice or soda, eat more  vegetables, use fresh or frozen vegetables, eat foods that are low in salt, eat baked foods instead of fried foods, eat fruit for snacks and desserts, limit or avoid alcohol  (12/11/2009)     Activity: take a 30 minute walk every day, park at the far end of the parking lot  (12/11/2009)    Hypertension self-management support: Written self-care plan, Education handout, Pre-printed educational material  (12/11/2009)   Hypertension self-care plan printed.   Hypertension education handout printed    Lipid self-management support: Written self-care plan, Education handout, Pre-printed educational material  (12/11/2009)   Lipid self-care plan printed.   Lipid education handout printed    Vital Signs:  Patient profile:   70 year old female Height:      62.5 inches (158.75 cm) Weight:      311.04 pounds (141.38 kg) BMI:     56.19 Temp:     98.4 degrees F (36.89 degrees C) oral Pulse rate:   104 / minute BP sitting:   156 / 92  (left arm)  Vitals Entered By: Angelina Ok RN (Dec 11, 2009 1:36 PM)

## 2010-09-02 NOTE — Assessment & Plan Note (Signed)
Summary: ACUTE-BLOOD PRESSURE AND CHOLES/CHECK AND LAB WORK([facility])/CFB   Vital Signs:  Patient profile:   70 year old female Height:      62.5 inches (158.75 cm) Weight:      319.01 pounds (145.00 kg) BMI:     57.63 Temp:     97.5 degrees F (36.39 degrees C) oral Pulse rate:   107 / minute BP sitting:   140 / 90  (right arm)  Vitals Entered By: Angelina Ok RN (June 02, 2010 8:28 AM) CC: Depression Is Patient Diabetic? No Pain Assessment Patient in pain? no      Nutritional Status BMI of > 30 = obese  Have you ever been in a relationship where you felt threatened, hurt or afraid?No   Does patient need assistance? Functional Status Self care Ambulation Normal Comments Needs refills on meds. Check up. Flu shot.  ? Lab work.   Primary Care Provider:  Mliss Sax MD  CC:  Depression.  History of Present Illness: 70 yr old woman with pmhx as described below comes to the clinic for management of Hypertension. Patient has no complians.   Depression History:      The patient denies a depressed mood most of the day and a diminished interest in her usual daily activities.         Preventive Screening-Counseling & Management  Alcohol-Tobacco     Alcohol drinks/day: 0     Smoking Status: quit     Year Quit: >40 yrs ago     Passive Smoke Exposure: yes  Problems Prior to Update: 1)  Hyperglycemia  (ICD-790.29) 2)  Fasting Hyperglycemia  (ICD-790.6) 3)  Hypertension  (ICD-401.9) 4)  Hyperparathyroidism Nos  (ICD-252.00) 5)  Hypercalcemia  (ICD-275.42) 6)  Obesity  (ICD-278.00) 7)  Hyperlipidemia  (ICD-272.4)  Medications Prior to Update: 1)  Furosemide 40 Mg Tabs (Furosemide) .... Take 1 Tablet By Mouth Two Times A Day 2)  Pravachol 20 Mg Tabs (Pravastatin Sodium) .... Take 1 Tablet By Mouth Once A Day 3)  Norvasc 10 Mg Tabs (Amlodipine Besylate) .... Take 1 Tablet By Mouth Once A Day 4)  Ventolin Hfa 108 (90 Base) Mcg/act Aers (Albuterol Sulfate) .... 2 Puffs  Inhaled Every 4-6 Hours As Needed For Wheezing 5)  Caltrate 600+d Plus 600-400 Mg-Unit Tabs (Calcium Carbonate-Vit D-Min) .... Take 1 Tablet By Mouth Once A Day. 6)  Imdur 120 Mg Xr24h-Tab (Isosorbide Mononitrate) .... Take 1 Tablet By Mouth Once A Day  Current Medications (verified): 1)  Furosemide 40 Mg Tabs (Furosemide) .... Take 1 Tablet By Mouth Two Times A Day 2)  Pravachol 20 Mg Tabs (Pravastatin Sodium) .... Take 1 Tablet By Mouth Once A Day 3)  Norvasc 10 Mg Tabs (Amlodipine Besylate) .... Take 1 Tablet By Mouth Once A Day 4)  Ventolin Hfa 108 (90 Base) Mcg/act Aers (Albuterol Sulfate) .... 2 Puffs Inhaled Every 4-6 Hours As Needed For Wheezing 5)  Imdur 120 Mg Xr24h-Tab (Isosorbide Mononitrate) .... Take 1 Tablet By Mouth Once A Day  Allergies: 1)  ! Asa 2)  ! Lisinopril 3)  ! * Crab But Not Shrimp 4)  ! * Hydralazine  Past History:  Past Medical History: Last updated: 08/13/2006 Hyperlipidemia Hypertension Obesity Hypercalcemia, resolved off of HCTZ  Family History: Last updated: 02/07/2008 father: heart attack at 25 mother: HTN, LVH brother: heart attck at 89  Social History: Last updated: 08/31/2008 lives alone, recently retired Lawyer (retired 4 months ago)  Risk Factors: Alcohol Use: 0 (06/02/2010) Caffeine  Use: 1 (11/19/2009) Exercise: yes (11/19/2009)  Risk Factors: Smoking Status: quit (06/02/2010) Passive Smoke Exposure: yes (06/02/2010)  Family History: Reviewed history from 02/07/2008 and no changes required. father: heart attack at 28 mother: HTN, LVH brother: heart attck at 59  Social History: Reviewed history from 08/31/2008 and no changes required. lives alone, recently retired Lawyer (retired 4 months ago)  Review of Systems  The patient denies fever, chest pain, dyspnea on exertion, hemoptysis, abdominal pain, melena, hematochezia, hematuria, muscle weakness, and difficulty walking.    Physical Exam  General:   Well-developed,well-nourished,in no acute distress; alert,appropriate and cooperative throughout examination Mouth:  good dentition.   Neck:  supple and no masses.   Lungs:  Normal respiratory effort, chest expands symmetrically. Lungs are clear to auscultation, no crackles or wheezes. Heart:  Normal rate and regular rhythm. S1 and S2 normal without gallop, murmur, click, rub or other extra sounds. Abdomen:  Bowel sounds positive,abdomen soft and non-tender without masses, organomegaly or hernias noted. Msk:  normal ROM.   Extremities:  trace edema bilaterally Neurologic:  alert & oriented X3.     Impression & Recommendations:  Problem # 1:  HYPERTENSION (ICD-401.9) Borderline controlled. Will continue current regimen. Consider adding labetalol if BP not controlled on follow up. No indication for imdur, would dc. Check bmet on follow up.  Her updated medication list for this problem includes:    Furosemide 40 Mg Tabs (Furosemide) .Marland Kitchen... Take 1 tablet by mouth two times a day    Norvasc 10 Mg Tabs (Amlodipine besylate) .Marland Kitchen... Take 1 tablet by mouth once a day  BP today: 140/90 Prior BP: 140/80 (12/25/2009)  Prior 10 Yr Risk Heart Disease: 15 % (06/04/2009)  Labs Reviewed: K+: 3.9 (01/08/2010) Creat: : 0.68 (01/08/2010)   Chol: 180 (11/19/2009)   HDL: 48 (11/19/2009)   LDL: 111 (11/19/2009)   TG: 105 (11/19/2009)  Problem # 2:  HYPERLIPIDEMIA (ICD-272.4) At goal. Continue current regimen.  Her updated medication list for this problem includes:    Pravachol 20 Mg Tabs (Pravastatin sodium) .Marland Kitchen... Take 1 tablet by mouth once a day  Labs Reviewed: SGOT: 23 (04/18/2009)   SGPT: 17 (04/18/2009)  Prior 10 Yr Risk Heart Disease: 15 % (06/04/2009)   HDL:48 (11/19/2009), 58 (04/18/2009)  LDL:111 (11/19/2009), 132 (04/18/2009)  Chol:180 (11/19/2009), 203 (04/18/2009)  Trig:105 (11/19/2009), 66 (04/18/2009)  Problem # 3:  OBESITY (ICD-278.00) Encouraged weight loss. Recommended lower calorie  diet and regular exercise.   Ht: 62.5 (06/02/2010)   Wt: 319.01 (06/02/2010)   BMI: 57.63 (06/02/2010)  Problem # 4:  Preventive Health Care (ICD-V70.0) Received flu shot today.  Complete Medication List: 1)  Furosemide 40 Mg Tabs (Furosemide) .... Take 1 tablet by mouth two times a day 2)  Pravachol 20 Mg Tabs (Pravastatin sodium) .... Take 1 tablet by mouth once a day 3)  Norvasc 10 Mg Tabs (Amlodipine besylate) .... Take 1 tablet by mouth once a day 4)  Ventolin Hfa 108 (90 Base) Mcg/act Aers (Albuterol sulfate) .... 2 puffs inhaled every 4-6 hours as needed for wheezing 5)  Imdur 120 Mg Xr24h-tab (Isosorbide mononitrate) .... Take 1 tablet by mouth once a day  Patient Instructions: 1)  Please schedule a follow-up appointment in 3 months. 2)  Take all medication as directed.   Orders Added: 1)  Est. Patient Level III [24401]    Prevention & Chronic Care Immunizations   Influenza vaccine: Not documented   Influenza vaccine deferral: Not indicated  (11/19/2009)   Influenza vaccine  due: 04/04/2011    Tetanus booster: 09/23/2009: Td   Tetanus booster due: 09/24/2019    Pneumococcal vaccine: Pneumovax (Medicare)  (07/01/2009)   Pneumococcal vaccine due: 07/01/2014    H. zoster vaccine: Not documented   H. zoster vaccine deferral: Not indicated  (11/19/2009)  Colorectal Screening   Hemoccult: Negative  (05/12/2006)   Hemoccult action/deferral: Not indicated  (11/19/2009)    Colonoscopy:  Mild diverticulosis was found in the sigmoid colon.  This was otherwise a normal examination of the colon.   Retroflexed views in the rectum revealed no abnormalities.    The time to cecum =  1.5  minutes. The scope was then withdrawn (time =  7.5  min) from the patient and the procedure completed.  (11/27/2008)   Colonoscopy due: 12/2018  Other Screening   Pap smear:  Specimen Adequacy: Satisfactory for evaluation.   Interpretation/Result:Negative for intraepithelial Lesion or  Malignancy.     (11/06/2008)   Pap smear action/deferral: Deferred-3 yr interval  (11/19/2009)   Pap smear due: 11/2011    Mammogram: No specific mammographic evidence of malignancy.  No significant changes compared to previous study.  Assessment: BIRADS 1.   (01/30/2010)   Mammogram due: 02/2011    DXA bone density scan: Not documented   DXA bone density action/deferral: Not indicated  (11/19/2009)   Smoking status: quit  (06/02/2010)  Lipids   Total Cholesterol: 180  (11/19/2009)   Lipid panel action/deferral: Lipid Panel ordered   LDL: 111  (11/19/2009)   LDL Direct: Not documented   HDL: 48  (11/19/2009)   Triglycerides: 105  (11/19/2009)    SGOT (AST): 23  (04/18/2009)   BMP action: Ordered   SGPT (ALT): 17  (04/18/2009)   Alkaline phosphatase: 111  (04/18/2009)   Total bilirubin: 0.5  (04/18/2009)    Lipid flowsheet reviewed?: Yes   Progress toward LDL goal: At goal  Hypertension   Last Blood Pressure: 140 / 90  (06/02/2010)   Serum creatinine: 0.68  (01/08/2010)   BMP action: Ordered   Serum potassium 3.9  (01/08/2010)    Hypertension flowsheet reviewed?: Yes   Progress toward BP goal: Unchanged  Self-Management Support :   Personal Goals (by the next clinic visit) :      Personal blood pressure goal: 140/90  (04/18/2009)     Personal LDL goal: 130  (04/18/2009)    Patient will work on the following items until the next clinic visit to reach self-care goals:     Medications and monitoring: take my medicines every day, bring all of my medications to every visit  (06/02/2010)     Eating: drink diet soda or water instead of juice or soda, eat more vegetables, use fresh or frozen vegetables, eat foods that are low in salt, eat baked foods instead of fried foods, eat fruit for snacks and desserts, limit or avoid alcohol  (06/02/2010)     Activity: take a 30 minute walk every day  (06/02/2010)    Hypertension self-management support: Written self-care plan,  Education handout, Pre-printed educational material, Resources for patients handout  (06/02/2010)   Hypertension self-care plan printed.   Hypertension education handout printed    Lipid self-management support: Written self-care plan, Education handout, Pre-printed educational material, Resources for patients handout  (06/02/2010)   Lipid self-care plan printed.   Lipid education handout printed      Resource handout printed.    Vital Signs:  Patient profile:   70 year old female Height:  62.5 inches (158.75 cm) Weight:      319.01 pounds (145.00 kg) BMI:     57.63 Temp:     97.5 degrees F (36.39 degrees C) oral Pulse rate:   107 / minute BP sitting:   140 / 90  (right arm)  Vitals Entered By: Angelina Ok RN (June 02, 2010 8:28 AM)   Appended Document: ACUTE-BLOOD PRESSURE AND CHOLES/CHECK AND LAB WORK([facility])/CFB   Immunizations Administered:  Influenza Vaccine # 1:    Vaccine Type: Fluvax MCR    Site: right deltoid    Mfr: Gl[employer]oSmith[employer]    Dose: 0.5 ml    Route: IM    Given by: Angelina Ok RN    Exp. Date: 01/31/2011    Lot #: [id number]    VIS given: 02/25/10 version given June 02, 2010.  Flu Vaccine Consent Questions:    Do you have a history of severe allergic reactions to this vaccine? no    Any prior history of allergic reactions to egg and/or gelatin? no    Do you have a sensitivity to the preservative Thimersol? no    Do you have a past history of Guillan-Barre Syndrome? no    Do you currently have an acute febrile illness? no    Have you ever had a severe reaction to latex? no    Vaccine information given and explained to patient? yes    Are you currently pregnant? no

## 2010-09-02 NOTE — Progress Notes (Signed)
Summary: refill/gg  Phone Note Refill Request  on September 24, 2009 11:59 AM  Refills Requested: Medication #1:  IMDUR 60 MG XR24H-TAB Take one tablet daily for blood pressure..   Last Refilled: 08/22/2009  Method Requested: Electronic Initial call taken by: Merrie Roof RN,  September 24, 2009 11:59 AM  Follow-up for Phone Call        completed Follow-up by: Mliss Sax MD,  September 25, 2009 1:38 PM    Prescriptions: IMDUR 60 MG XR24H-TAB (ISOSORBIDE MONONITRATE) Take one tablet daily for blood pressure.  #32 x 0   Entered and Authorized by:   Mliss Sax MD   Signed by:   Mliss Sax MD on 09/25/2009   Method used:   [facility]ron[facility] to        [facility] [address] [id number]* (retail)       [address]       [place], [place]  [address]       Ph: [phone] or [phone]       Fax: [phone]   RxID:   [id number]

## 2010-09-02 NOTE — Assessment & Plan Note (Signed)
Summary: EST-2 WEEK RECHECK/CH   Vital Signs:  Patient profile:   70 year old female Height:      62.5 inches (158.75 cm) Weight:      310.4 pounds (141.09 kg) BMI:     56.07 Temp:     98.3 degrees F (36.83 degrees C) oral Pulse rate:   104 / minute BP sitting:   140 / 80  (right arm)  Vitals Entered By: Cynda Familia Duncan Dull) (Dec 25, 2009 2:26 PM) CC: bp ck Is Patient Diabetic? No Nutritional Status BMI of > 30 = obese  Have you ever been in a relationship where you felt threatened, hurt or afraid?No   Does patient need assistance? Functional Status Self care Ambulation Impaired:Risk for fall   Primary Care Provider:  Mliss Sax MD  CC:  bp ck.  History of Present Illness: 70 yo female with PMH outlined below presents to [facility] for regular follow up appointment. She came in for BP check. She has no concerns at the time. No recent sicknesses or hospitalizaitons. No episodes of chest pain, SOB, palpitations. No specific abdominal or urinary concerns. No recent changes in appetite, weight, sleep patterns, mood.    Preventive Screening-Counseling & Management  Alcohol-Tobacco     Alcohol drinks/day: 0     Smoking Status: quit     Year Quit: >40 yrs ago     Passive Smoke Exposure: yes  Problems Prior to Update: 1)  Hyperglycemia  (ICD-790.29) 2)  Fasting Hyperglycemia  (ICD-790.6) 3)  Hypertension  (ICD-401.9) 4)  Hyperparathyroidism Nos  (ICD-252.00) 5)  Hypercalcemia  (ICD-275.42) 6)  Obesity  (ICD-278.00) 7)  Hyperlipidemia  (ICD-272.4)  Medications Prior to Update: 1)  Furosemide 20 Mg  Tabs (Furosemide) .... Take 1 Tablet By Mouth Twice A Day 2)  Pravachol 20 Mg Tabs (Pravastatin Sodium) .... Take 1 Tablet By Mouth Once A Day 3)  Norvasc 10 Mg Tabs (Amlodipine Besylate) .... Take 1 Tablet By Mouth Once A Day 4)  Ventolin Hfa 108 (90 Base) Mcg/act Aers (Albuterol Sulfate) .... 2 Puffs Inhaled Every 4-6 Hours As Needed For Wheezing 5)  Caltrate 600+d Plus  600-400 Mg-Unit Tabs (Calcium Carbonate-Vit D-Min) .... Take 1 Tablet By Mouth Once A Day. 6)  Imdur 120 Mg Xr24h-Tab (Isosorbide Mononitrate) .... Take 1 Tablet By Mouth Once A Day  Current Medications (verified): 1)  Furosemide 20 Mg  Tabs (Furosemide) .... Take 1 Tablet By Mouth Twice A Day 2)  Pravachol 20 Mg Tabs (Pravastatin Sodium) .... Take 1 Tablet By Mouth Once A Day 3)  Norvasc 10 Mg Tabs (Amlodipine Besylate) .... Take 1 Tablet By Mouth Once A Day 4)  Ventolin Hfa 108 (90 Base) Mcg/act Aers (Albuterol Sulfate) .... 2 Puffs Inhaled Every 4-6 Hours As Needed For Wheezing 5)  Caltrate 600+d Plus 600-400 Mg-Unit Tabs (Calcium Carbonate-Vit D-Min) .... Take 1 Tablet By Mouth Once A Day. 6)  Imdur 120 Mg Xr24h-Tab (Isosorbide Mononitrate) .... Take 1 Tablet By Mouth Once A Day  Allergies (verified): 1)  ! Asa 2)  ! Lisinopril 3)  ! * Crab But Not Shrimp 4)  ! * Hydralazine  Past History:  Past Medical History: Last updated: 08/13/2006 Hyperlipidemia Hypertension Obesity Hypercalcemia, resolved off of HCTZ  Family History: Last updated: 02/07/2008 father: heart attack at 67 mother: HTN, LVH brother: heart attck at 30  Social History: Last updated: 08/31/2008 lives alone, recently retired Lawyer (retired 4 months ago)  Risk Factors: Alcohol Use: 0 (12/25/2009) Caffeine Use:  1 (11/19/2009) Exercise: yes (11/19/2009)  Risk Factors: Smoking Status: quit (12/25/2009) Passive Smoke Exposure: yes (12/25/2009)  Family History: Reviewed history from 02/07/2008 and no changes required. father: heart attack at 50 mother: HTN, LVH brother: heart attck at 92  Social History: Reviewed history from 08/31/2008 and no changes required. lives alone, recently retired Lawyer (retired 4 months ago)  Review of Systems       per HPI  Physical Exam  General:  Well-developed,well-nourished,in no acute distress; alert,appropriate and cooperative throughout examination Lungs:   Normal respiratory effort, chest expands symmetrically. Lungs are clear to auscultation, no crackles or wheezes. Heart:  Normal rate and regular rhythm. S1 and S2 normal without gallop, murmur, click, rub or other extra sounds. Abdomen:  Bowel sounds positive,abdomen soft and non-tender without masses, organomegaly or hernias noted. Psych:  Cognition and judgment appear intact. Alert and cooperative with normal attention span and concentration. No apparent delusions, illusions, hallucinations   Impression & Recommendations:  Problem # 1:  HYPERTENSION (ICD-401.9) Improved but still above the goal. Will increase lasix to 40 mg two times a day and will check her bmet in 2 weeks. She will continue to monitor her BP regularly and said she will call us sooner if her BP is higher then 160/90. Her updated medication list for this problem includes:    Furosemide 40 Mg Tabs (Furosemide) .Marland Kitchen... Take 1 tablet by mouth two times a day    Norvasc 10 Mg Tabs (Amlodipine besylate) .Marland Kitchen... Take 1 tablet by mouth once a day  BP today: 140/80 Prior BP: 156/92 (12/11/2009)  Prior 10 Yr Risk Heart Disease: 15 % (06/04/2009)  Labs Reviewed: K+: 4.2 (11/19/2009) Creat: : 0.72 (11/19/2009)   Chol: 180 (11/19/2009)   HDL: 48 (11/19/2009)   LDL: 111 (11/19/2009)   TG: 105 (11/19/2009)  Complete Medication List: 1)  Furosemide 40 Mg Tabs (Furosemide) .... Take 1 tablet by mouth two times a day 2)  Pravachol 20 Mg Tabs (Pravastatin sodium) .... Take 1 tablet by mouth once a day 3)  Norvasc 10 Mg Tabs (Amlodipine besylate) .... Take 1 tablet by mouth once a day 4)  Ventolin Hfa 108 (90 Base) Mcg/act Aers (Albuterol sulfate) .... 2 puffs inhaled every 4-6 hours as needed for wheezing 5)  Caltrate 600+d Plus 600-400 Mg-unit Tabs (Calcium carbonate-vit d-min) .... Take 1 tablet by mouth once a day. 6)  Imdur 120 Mg Xr24h-tab (Isosorbide mononitrate) .... Take 1 tablet by mouth once a day  Patient Instructions: 1)   Please schedule a follow-up appointment in 2 weeks for blood work.  2)  Please check your blood pressure regularly, if it is >170 please call clinic at [phone] Prescriptions: FUROSEMIDE 40 MG TABS (FUROSEMIDE) Take 1 tablet by mouth two times a day  #60 x 3   Entered and Authorized by:   Mliss Sax MD   Signed by:   Mliss Sax MD on 12/25/2009   Method used:   [facility]        [facility] [address] [id number]* (retail)       [address]       [place], [place]  [address]       Ph: [phone] or [phone]       Fax: [phone]   RxID:   [id number]    Prevention & Chronic Care Immunizations   Influenza vaccine: Not documented   Influenza vaccine deferral: Not indicated  (11/19/2009)   Influenza  vaccine due: 04/04/2011    Tetanus booster: 09/23/2009: Td   Tetanus booster due: 09/24/2019    Pneumococcal vaccine: Pneumovax (Medicare)  (07/01/2009)   Pneumococcal vaccine due: 07/01/2014    H. zoster vaccine: Not documented   H. zoster vaccine deferral: Not indicated  (11/19/2009)  Colorectal Screening   Hemoccult: Negative  (05/12/2006)   Hemoccult action/deferral: Not indicated  (11/19/2009)    Colonoscopy:  Mild diverticulosis was found in the sigmoid colon.  This was otherwise a normal examination of the colon.   Retroflexed views in the rectum revealed no abnormalities.    The time to cecum =  1.5  minutes. The scope was then withdrawn (time =  7.5  min) from the patient and the procedure completed.  (11/27/2008)   Colonoscopy due: 12/2018  Other Screening   Pap smear:  Specimen Adequacy: Satisfactory for evaluation.   Interpretation/Result:Negative for intraepithelial Lesion or Malignancy.     (11/06/2008)   Pap smear action/deferral: Deferred-3 yr interval  (11/19/2009)   Pap smear due: 11/2011    Mammogram: No specific mammographic evidence of malignancy.  No significant changes compared to previous study.  Assessment: BIRADS  1.   (01/29/2009)   Mammogram due: 01/2010    DXA bone density scan: Not documented   DXA bone density action/deferral: Not indicated  (11/19/2009)   Smoking status: quit  (12/25/2009)  Lipids   Total Cholesterol: 180  (11/19/2009)   Lipid panel action/deferral: Lipid Panel ordered   LDL: 111  (11/19/2009)   LDL Direct: Not documented   HDL: 48  (11/19/2009)   Triglycerides: 105  (11/19/2009)    SGOT (AST): 23  (04/18/2009)   BMP action: Ordered   SGPT (ALT): 17  (04/18/2009)   Alkaline phosphatase: 111  (04/18/2009)   Total bilirubin: 0.5  (04/18/2009)    Lipid flowsheet reviewed?: Yes   Progress toward LDL goal: Improved  Hypertension   Last Blood Pressure: 140 / 80  (12/25/2009)   Serum creatinine: 0.72  (11/19/2009)   BMP action: Ordered   Serum potassium 4.2  (11/19/2009)    Hypertension flowsheet reviewed?: Yes   Progress toward BP goal: Improved  Self-Management Support :   Personal Goals (by the next clinic visit) :      Personal blood pressure goal: 140/90  (04/18/2009)     Personal LDL goal: 130  (04/18/2009)    Patient will work on the following items until the next clinic visit to reach self-care goals:     Medications and monitoring: take my medicines every day  (12/25/2009)     Eating: eat foods that are low in salt, eat baked foods instead of fried foods  (12/25/2009)     Activity: take a 30 minute walk every day, park at the far end of the parking lot  (12/11/2009)    Hypertension self-management support: Pre-printed educational material, Written self-care plan, Resources for patients handout  (12/25/2009)   Hypertension self-care plan printed.    Lipid self-management support: Pre-printed educational material, Written self-care plan, Resources for patients handout  (12/25/2009)   Lipid self-care plan printed.      Resource handout printed.    Appended Document: EST-2 WEEK RECHECK/CH    Clinical Lists Changes  Orders: Added new Test  order of T-Basic Metabolic Panel [id number]) - Signed

## 2010-09-02 NOTE — Assessment & Plan Note (Signed)
Summary: EST-2 MONTH RECHECK/CH   Vital Signs:  Patient profile:   70 year old female Height:      62.5 inches (158.75 cm) Weight:      313.3 pounds (142.41 kg) BMI:     56.59 Temp:     97.1 degrees F (36.17 degrees C) oral Pulse rate:   94 / minute BP sitting:   162 / 102  (right arm)  Vitals Entered By: Stanton Kidney Ditzler RN (November 19, 2009 8:43 AM) Is Patient Diabetic? No Pain Assessment Patient in pain? no      Nutritional Status BMI of > 30 = obese Nutritional Status Detail appetite good  Have you ever been in a relationship where you felt threatened, hurt or afraid?denies   Does patient need assistance? Functional Status Self care Ambulation Normal Comments FU and labs - doing well.   Primary Care Provider:  Mliss Sax MD   History of Present Illness: 70 yo female with PMH outlined below presents to [facility] for regular follow up appointment. She has no concerns at the time. No recent sicknesses or hospitalizaitons. No episodes of chest pain, SOB, palpitations. No specific abdominal or urinary concerns. No recent changes in appetite, weight, sleep patterns, mood.    Depression History:      The patient denies a depressed mood most of the day and a diminished interest in her usual daily activities.  The patient denies significant weight loss, significant weight gain, insomnia, hypersomnia, psychomotor agitation, psychomotor retardation, fatigue (loss of energy), feelings of worthlessness (guilt), impaired concentration (indecisiveness), and recurrent thoughts of death or suicide.        The patient denies that she feels like life is not worth living, denies that she wishes that she were dead, and denies that she has thought about ending her life.         Preventive Screening-Counseling & Management  Alcohol-Tobacco     Alcohol drinks/day: 0     Smoking Status: quit     Year Quit: >40 yrs ago     Passive Smoke Exposure: yes  Caffeine-Diet-Exercise     Caffeine  use/day: 1     Does Patient Exercise: yes     Type of exercise: WALKS     Exercise (avg: min/session):   30     Times/week: 1  Problems Prior to Update: 1)  Hyperglycemia  (ICD-790.29) 2)  Cerumen Impaction, Bilateral  (ICD-380.4) 3)  Preventive Health Care  (ICD-V70.0) 4)  Hip Pain, Left  (ICD-719.45) 5)  Fasting Hyperglycemia  (ICD-790.6) 6)  Hypertension  (ICD-401.9) 7)  Hyperparathyroidism Nos  (ICD-252.00) 8)  Hypercalcemia  (ICD-275.42) 9)  Obesity  (ICD-278.00) 10)  Hyperlipidemia  (ICD-272.4)  Medications Prior to Update: 1)  Furosemide 20 Mg  Tabs (Furosemide) .... Take 1 Tablet By Mouth Twice A Day 2)  Pravachol 20 Mg Tabs (Pravastatin Sodium) .... Take 1 Tablet By Mouth Once A Day 3)  Norvasc 10 Mg Tabs (Amlodipine Besylate) .... Take 1 Tablet By Mouth Once A Day 4)  Ventolin Hfa 108 (90 Base) Mcg/act Aers (Albuterol Sulfate) .... 2 Puffs Inhaled Every 4-6 Hours As Needed For Wheezing 5)  Caltrate 600+d Plus 600-400 Mg-Unit Tabs (Calcium Carbonate-Vit D-Min) .... Take 1 Tablet By Mouth Once A Day. 6)  Imdur 60 Mg Xr24h-Tab (Isosorbide Mononitrate) .... Take One Tablet Daily For Blood Pressure.  Current Medications (verified): 1)  Furosemide 20 Mg  Tabs (Furosemide) .... Take 1 Tablet By Mouth Twice A Day 2)  Pravachol 20  Mg Tabs (Pravastatin Sodium) .... Take 1 Tablet By Mouth Once A Day 3)  Norvasc 10 Mg Tabs (Amlodipine Besylate) .... Take 1 Tablet By Mouth Once A Day 4)  Ventolin Hfa 108 (90 Base) Mcg/act Aers (Albuterol Sulfate) .... 2 Puffs Inhaled Every 4-6 Hours As Needed For Wheezing 5)  Caltrate 600+d Plus 600-400 Mg-Unit Tabs (Calcium Carbonate-Vit D-Min) .... Take 1 Tablet By Mouth Once A Day. 6)  Imdur 60 Mg Xr24h-Tab (Isosorbide Mononitrate) .... Take One Tablet Daily For Blood Pressure.  Allergies: 1)  ! Asa 2)  ! Lisinopril 3)  ! * Crab But Not Shrimp  Past History:  Past Medical History: Last updated:  08/13/2006 Hyperlipidemia Hypertension Obesity Hypercalcemia, resolved off of HCTZ  Family History: Last updated: 02/07/2008 father: heart attack at 3 mother: HTN, LVH brother: heart attck at 35  Social History: Last updated: 08/31/2008 lives alone, recently retired Lawyer (retired 4 months ago)  Risk Factors: Alcohol Use: 0 (11/19/2009) Caffeine Use: 1 (11/19/2009) Exercise: yes (11/19/2009)  Risk Factors: Smoking Status: quit (11/19/2009) Passive Smoke Exposure: yes (11/19/2009)  Family History: Reviewed history from 02/07/2008 and no changes required. father: heart attack at 22 mother: HTN, LVH brother: heart attck at 81  Social History: Reviewed history from 08/31/2008 and no changes required. lives alone, recently retired Lawyer (retired 4 months ago)  Review of Systems       per HPI   Physical Exam  General:  Well-developed,well-nourished,in no acute distress; alert,appropriate and cooperative throughout examination Lungs:  Normal respiratory effort, chest expands symmetrically. Lungs are clear to auscultation, no crackles or wheezes. Heart:  Normal rate and regular rhythm. S1 and S2 normal without gallop, murmur, click, rub or other extra sounds. Abdomen:  Bowel sounds positive,abdomen soft and non-tender without masses, organomegaly or hernias noted. Neurologic:  No cranial nerve deficits noted. Station and gait are normal. Plantar reflexes are down-going bilaterally. DTRs are symmetrical throughout. Sensory, motor and coordinative functions appear intact.   Impression & Recommendations:  Problem # 1:  HYPERTENSION (ICD-401.9)  Since patient has allergic reaction to ACEI I can not add that class of drug to her regimen and she is compliant so I do not think that her uncontrolled HTN is secondary to that. I will initiate new treatment with hydralazine two times a day even though recommendation is 4 times per day for the beginning but she tells me that she wants  to try twice daily and I think it is reasonable. Will see her in next 2 weeks for re-evaluation.  Her updated medication list for this problem includes:    Furosemide 20 Mg Tabs (Furosemide) .Marland Kitchen... Take 1 tablet by mouth twice a day    Norvasc 10 Mg Tabs (Amlodipine besylate) .Marland Kitchen... Take 1 tablet by mouth once a day    Hydralazine Hcl 10 Mg Tabs (Hydralazine hcl) .Marland Kitchen... Take 1 tablet by mouth two times a day  BP today: 162/102 Prior BP: 181/95 (09/23/2009)  Prior 10 Yr Risk Heart Disease: 15 % (06/04/2009)  Labs Reviewed: K+: 4.1 (07/25/2009) Creat: : 0.72 (07/25/2009)   Chol: 203 (04/18/2009)   HDL: 58 (04/18/2009)   LDL: 132 (04/18/2009)   TG: 66 (04/18/2009)  Orders: T-Basic Metabolic Panel [id number])  Problem # 2:  OBESITY (ICD-278.00)  Ht: 62.5 (11/19/2009)   Wt: 313.3 (11/19/2009)   BMI: 56.59 (11/19/2009) Discussed diet and exercise plan.   Problem # 3:  HYPERGLYCEMIA (ICD-790.29) Will check A1C today and initiate the regimen as indicated.   Problem # 4:  HYPERLIPIDEMIA (ICD-272.4) Will check FLP today and follow up.  Her updated medication list for this problem includes:    Pravachol 20 Mg Tabs (Pravastatin sodium) .Marland Kitchen... Take 1 tablet by mouth once a day  Orders: T-Lipid Profile ([id number])  Complete Medication List: 1)  Furosemide 20 Mg Tabs (Furosemide) .... Take 1 tablet by mouth twice a day 2)  Pravachol 20 Mg Tabs (Pravastatin sodium) .... Take 1 tablet by mouth once a day 3)  Norvasc 10 Mg Tabs (Amlodipine besylate) .... Take 1 tablet by mouth once a day 4)  Ventolin Hfa 108 (90 Base) Mcg/act Aers (Albuterol sulfate) .... 2 puffs inhaled every 4-6 hours as needed for wheezing 5)  Caltrate 600+d Plus 600-400 Mg-unit Tabs (Calcium carbonate-vit d-min) .... Take 1 tablet by mouth once a day. 6)  Imdur 60 Mg Xr24h-tab (Isosorbide mononitrate) .... Take one tablet daily for blood pressure. 7)  Hydralazine Hcl 10 Mg Tabs (Hydralazine hcl) .... Take 1 tablet by mouth  two times a day  Other Orders: T-Hgb A1C (in-house) ([id number])  Patient Instructions: 1)  Please schedule a follow-up appointment in 6 months. 2)  Please check your blood pressure regularly, if it is >170 please call clinic at [phone] 3)  It is important that you exercise regularly at least 20 minutes 5 times a week. If you develop chest pain, have severe difficulty breathing, or feel very tired , stop exercising immediately and seek medical attention. Prescriptions: IMDUR 60 MG XR24H-TAB (ISOSORBIDE MONONITRATE) Take one tablet daily for blood pressure.  #32 x 3   Entered and Authorized by:   Mliss Sax MD   Signed by:   Mliss Sax MD on 11/19/2009   Method used:   [facility]        [facility] [address] [phone]* (retail)       [address]       [place], [place]  [address]       Ph: [phone] or [phone]       Fax: [phone]   RxID:   [id number] VENTOLIN HFA 108 (90 BASE) MCG/ACT AERS (ALBUTEROL SULFATE) 2 puffs inhaled every 4-6 hours as needed for wheezing  #1 x 6   Entered and Authorized by:   Mliss Sax MD   Signed by:   Mliss Sax MD on 11/19/2009   Method used:   [facility] to        [facility] [address] [phone]* (retail)       [address]       [place], [place]  [address]       Ph: [phone] or [phone]       Fax: [phone]   RxID:   [id number] PRAVACHOL 20 MG TABS (PRAVASTATIN SODIUM) Take 1 tablet by mouth once a day  #30 x 6   Entered and Authorized by:   Mliss Sax MD   Signed by:   Mliss Sax MD on 11/19/2009   Method used:   [facility]        [facility] [address] [phone]* (retail)       [address]       [place], [place]  [address]       Ph: [phone] or [phone]       Fax: [phone]   RxID:   [id number] FUROSEMIDE 20 MG  TABS (FUROSEMIDE) Take 1  tablet by mouth twice a day  #64 x 3   Entered and Authorized by:   Mliss Sax MD   Signed by:   Mliss Sax MD on 11/19/2009   Method used:   [facility]ronically to        [facility] [address] [phone]* (retail)       [address]       [place], [place]  [address]       Ph: [phone] or [phone]       Fax: [phone]   RxID:   [id number] HYDRALAZINE HCL 10 MG TABS (HYDRALAZINE HCL) Take 1 tablet by mouth two times a day  #60 x 3   Entered and Authorized by:   Mliss Sax MD   Signed by:   Mliss Sax MD on 11/19/2009   Method used:   [facility]ron[facility] to        [facility] [address] [phone]* (retail)       [address]       [place], [place]  [address]       Ph: [phone] or [phone]       Fax: [phone]   RxID:   [id number] NORVASC 10 MG TABS (AMLODIPINE BESYLATE) Take 1 tablet by mouth once a day  #30 x 3   Entered and Authorized by:   Mliss Sax MD   Signed by:   Mliss Sax MD on 11/19/2009   Method used:   [facility] to        [facility] [address] [phone]* (retail)       [address]       [place], [place]  [address]       Ph: [phone] or [phone]       Fax: [phone]   RxID:   [id number]  Process Orders Check Orders Results:     [facility]: ABN not required for this insurance Order queued for requisitioning for [facility]: November 19, 2009 9:01 AM  Tests Sent for requisitioning (November 19, 2009 9:01 AM):     11/19/2009: [facility] -- T-Lipid Profile [phone] (signed)     11/19/2009: [facility] -- T-Basic Metabolic Panel [phone] (signed)    Prevention & Chronic Care Immunizations   Influenza vaccine: Not documented   Influenza vaccine deferral: Not indicated  (11/19/2009)   Influenza vaccine due: 04/04/2011    Tetanus booster: 09/23/2009: Td   Tetanus booster due: 09/24/2019    Pneumococcal vaccine: Pneumovax (Medicare)  (07/01/2009)   Pneumococcal vaccine due:  07/01/2014    H. zoster vaccine: Not documented   H. zoster vaccine deferral: Not indicated  (11/19/2009)  Colorectal Screening   Hemoccult: Negative  (05/12/2006)   Hemoccult action/deferral: Not indicated  (11/19/2009)    Colonoscopy:  Mild diverticulosis was found in the sigmoid colon.  This was otherwise a normal examination of the colon.   Retroflexed views in the rectum revealed no abnormalities.    The time to cecum =  1.5  minutes. The scope was then withdrawn (time =  7.5  min) from the patient and the procedure completed.  (11/27/2008)   Colonoscopy due: 12/2018  Other Screening   Pap smear:  Specimen Adequacy: Satisfactory for evaluation.   Interpretation/Result:Negative for intraepithelial Lesion or Malignancy.     (11/06/2008)   Pap smear action/deferral: Deferred-3 yr interval  (11/19/2009)  Pap smear due: 11/2011    Mammogram: No specific mammographic evidence of malignancy.  No significant changes compared to previous study.  Assessment: BIRADS 1.   (01/29/2009)   Mammogram due: 01/2010    DXA bone density scan: Not documented   DXA bone density action/deferral: Not indicated  (11/19/2009)   Smoking status: quit  (11/19/2009)  Lipids   Total Cholesterol: 203  (04/18/2009)   Lipid panel action/deferral: Lipid Panel ordered   LDL: 132  (04/18/2009)   LDL Direct: Not documented   HDL: 58  (04/18/2009)   Triglycerides: 66  (04/18/2009)    SGOT (AST): 23  (04/18/2009)   BMP action: Ordered   SGPT (ALT): 17  (04/18/2009)   Alkaline phosphatase: 111  (04/18/2009)   Total bilirubin: 0.5  (04/18/2009)    Lipid flowsheet reviewed?: Yes   Progress toward LDL goal: Unchanged  Hypertension   Last Blood Pressure: 162 / 102  (11/19/2009)   Serum creatinine: 0.72  (07/25/2009)   BMP action: Ordered   Serum potassium 4.1  (07/25/2009)    Hypertension flowsheet reviewed?: Yes   Progress toward BP goal: Unchanged  Self-Management Support :   Personal Goals (by  the next clinic visit) :      Personal blood pressure goal: 140/90  (04/18/2009)     Personal LDL goal: 130  (04/18/2009)    Patient will work on the following items until the next clinic visit to reach self-care goals:     Medications and monitoring: take my medicines every day, check my blood pressure, bring all of my medications to every visit  (11/19/2009)     Eating: eat more vegetables, use fresh or frozen vegetables, eat foods that are low in salt, eat baked foods instead of fried foods, eat fruit for snacks and desserts, limit or avoid alcohol  (11/19/2009)     Activity: take a 30 minute walk every day, park at the far end of the parking lot  (11/19/2009)    Hypertension self-management support: Written self-care plan, Education handout, Resources for patients handout  (11/19/2009)   Hypertension self-care plan printed.   Hypertension education handout printed    Lipid self-management support: Written self-care plan, Education handout, Resources for patients handout  (11/19/2009)   Lipid self-care plan printed.   Lipid education handout printed      Resource handout printed.   Appended Document: Results of HGB A1C    Lab Visit  Laboratory Results   Blood Tests   Date/Time Received: November 19, 2009 9:39 AM  Date/Time Reported: Burke Keels  November 19, 2009 9:39 AM   HGBA1C: 5.6%   (Normal Range: Non-Diabetic - 3-6%   Control Diabetic - 6-8%)    Orders Today:

## 2010-09-02 NOTE — Assessment & Plan Note (Signed)
Summary: 2WK F/U/MAGICK/VS   Vital Signs:  Patient profile:   70 year old female Height:      62.5 inches (158.75 cm) Weight:      315.5 pounds (143.41 kg) BMI:     56.99 Temp:     97.4 degrees F (36.33 degrees C) oral Pulse rate:   107 / minute BP sitting:   156 / 92  (right arm)  Vitals Entered By: Stanton Kidney Ditzler RN (August 09, 2009 10:14 AM) Is Patient Diabetic? No Pain Assessment Patient in pain? no      Nutritional Status BMI of > 30 = obese Nutritional Status Detail appetite good  Have you ever been in a relationship where you felt threatened, hurt or afraid?denies   Does patient need assistance? Functional Status Self care Ambulation Normal Comments FU   Primary Care Provider:  Mliss Sax MD   History of Present Illness: This is a  year old woman with past medical history of   Hyperlipidemia Hypertension Obesity Hypercalcemia and elevated PTH both mild.  She is here for blood pressure recheck.  At our last appointment she asked to get off of clonidine, so she is tapering off of it.  She is now on Imdur.  She reports that she feels much better, the lethargy she experienced with clonidine is now gone and she is very happy with this.       Depression History:      The patient denies a depressed mood most of the day and a diminished interest in her usual daily activities.         Preventive Screening-Counseling & Management  Alcohol-Tobacco     Alcohol drinks/day: 0     Smoking Status: quit     Year Quit: >40 yrs ago     Passive Smoke Exposure: yes  Caffeine-Diet-Exercise     Caffeine use/day: 1     Does Patient Exercise: yes     Type of exercise: WALKS     Exercise (avg: min/session):   30     Times/week: 1  Medications Prior to Update: 1)  Furosemide 20 Mg  Tabs (Furosemide) .... Take 1 Tablet By Mouth Twice A Day 2)  Pravachol 20 Mg Tabs (Pravastatin Sodium) .... Take 1 Tablet By Mouth Once A Day 3)  Norvasc 10 Mg Tabs (Amlodipine  Besylate) .... Take 1 Tablet By Mouth Once A Day 4)  Ventolin Hfa 108 (90 Base) Mcg/act Aers (Albuterol Sulfate) .... 2 Puffs Inhaled Every 4-6 Hours As Needed For Wheezing 5)  Caltrate 600+d Plus 600-400 Mg-Unit Tabs (Calcium Carbonate-Vit D-Min) .... Take 1 Tablet By Mouth Once A Day. 6)  Catapres 0.2 Mg Tabs (Clonidine Hcl) .... Take One Tablet Two Times A Day For One Week. 7)  Imdur 30 Mg Xr24h-Tab (Isosorbide Mononitrate) .... Take One Tablet Daily For Blood Pressure. 8)  Catapres 0.1 Mg Tabs (Clonidine Hcl) .... Take One Tablet Two Times A Day For One Week, Then One Tablet Daily For One Week and Then Stop.  Allergies: 1)  ! Asa 2)  ! Lisinopril 3)  ! * Crab But Not Shrimp  Review of Systems       per hpi  Physical Exam  General:  alert and overweight-appearing.   Lungs:  normal respiratory effort and normal breath sounds.   Heart:  normal rate, regular rhythm, and no murmur.   Psych:  Oriented X3, memory intact for recent and remote, normally interactive, good eye contact, and not anxious appearing.  Impression & Recommendations:  Problem # 1:  HYPERTENSION (ICD-401.9) unchanged with switch from clonidine to imdur.  She does not want to increase does of imdur right now, though it would be easy to do.  She will rtc in 2 weeks for bp check to see if meds should be changed.  Her updated medication list for this problem includes:    Furosemide 20 Mg Tabs (Furosemide) .Marland Kitchen... Take 1 tablet by mouth twice a day    Norvasc 10 Mg Tabs (Amlodipine besylate) .Marland Kitchen... Take 1 tablet by mouth once a day    Catapres 0.2 Mg Tabs (Clonidine hcl) .Marland Kitchen... Take one tablet two times a day for one week.    Catapres 0.1 Mg Tabs (Clonidine hcl) .Marland Kitchen... Take one tablet two times a day for one week, then one tablet daily for one week and then stop.  BP today: 156/92 Prior BP: 158/95 (07/25/2009)  Prior 10 Yr Risk Heart Disease: 15 % (06/04/2009)  Labs Reviewed: K+: 4.1 (07/25/2009) Creat: : 0.72  (07/25/2009)   Chol: 203 (04/18/2009)   HDL: 58 (04/18/2009)   LDL: 132 (04/18/2009)   TG: 66 (04/18/2009)  Problem # 2:  OBESITY (ICD-278.00) She is very happy that she has lost a pound and is motivated to lose more. She will work on diet and exercise.   Labs Reviewed: SGOT: 23 (04/18/2009)   SGPT: 17 (04/18/2009)  Prior 10 Yr Risk Heart Disease: 15 % (06/04/2009)   HDL:58 (04/18/2009), 52 (08/31/2008)  LDL:132 (04/18/2009), 154 (08/31/2008)  Chol:203 (04/18/2009), 231 (08/31/2008)  Trig:66 (04/18/2009), 123 (08/31/2008)  Complete Medication List: 1)  Furosemide 20 Mg Tabs (Furosemide) .... Take 1 tablet by mouth twice a day 2)  Pravachol 20 Mg Tabs (Pravastatin sodium) .... Take 1 tablet by mouth once a day 3)  Norvasc 10 Mg Tabs (Amlodipine besylate) .... Take 1 tablet by mouth once a day 4)  Ventolin Hfa 108 (90 Base) Mcg/act Aers (Albuterol sulfate) .... 2 puffs inhaled every 4-6 hours as needed for wheezing 5)  Caltrate 600+d Plus 600-400 Mg-unit Tabs (Calcium carbonate-vit d-min) .... Take 1 tablet by mouth once a day. 6)  Catapres 0.2 Mg Tabs (Clonidine hcl) .... Take one tablet two times a day for one week. 7)  Imdur 30 Mg Xr24h-tab (Isosorbide mononitrate) .... Take one tablet daily for blood pressure. 8)  Catapres 0.1 Mg Tabs (Clonidine hcl) .... Take one tablet two times a day for one week, then one tablet daily for one week and then stop.  Patient Instructions: 1)  Continue to taper off clonidine. 2)  Continue to take your new medication. 3)  Please schedule a follow-up appointment in 2 weeks for blood pressure check.   Prevention & Chronic Care Immunizations   Influenza vaccine: Not documented   Influenza vaccine deferral: Deferred  (05/07/2009)    Tetanus booster: Not documented    Pneumococcal vaccine: Pneumovax (Medicare)  (07/01/2009)    H. zoster vaccine: Not documented  Colorectal Screening   Hemoccult: Negative  (05/12/2006)    Colonoscopy:  Mild  diverticulosis was found in the sigmoid colon.  This was otherwise a normal examination of the colon.   Retroflexed views in the rectum revealed no abnormalities.    The time to cecum =  1.5  minutes. The scope was then withdrawn (time =  7.5  min) from the patient and the procedure completed.  (11/27/2008)   Colonoscopy due: 12/2018  Other Screening   Pap smear:  Specimen Adequacy: Satisfactory for evaluation.  Interpretation/Result:Negative for intraepithelial Lesion or Malignancy.     (11/06/2008)   Pap smear due: 11/2011    Mammogram: No specific mammographic evidence of malignancy.  No significant changes compared to previous study.  Assessment: BIRADS 1.   (01/29/2009)   Mammogram due: 01/2010    DXA bone density scan: Not documented   DXA bone density action/deferral: Ordered  (04/18/2009)   Smoking status: quit  (08/09/2009)  Lipids   Total Cholesterol: 203  (04/18/2009)   Lipid panel action/deferral: Lipid Panel ordered   LDL: 132  (04/18/2009)   LDL Direct: Not documented   HDL: 58  (04/18/2009)   Triglycerides: 66  (04/18/2009)    SGOT (AST): 23  (04/18/2009)   BMP action: Ordered   SGPT (ALT): 17  (04/18/2009)   Alkaline phosphatase: 111  (04/18/2009)   Total bilirubin: 0.5  (04/18/2009)  Hypertension   Last Blood Pressure: 156 / 92  (08/09/2009)   Serum creatinine: 0.72  (07/25/2009)   BMP action: Ordered   Serum potassium 4.1  (07/25/2009)  Self-Management Support :   Personal Goals (by the next clinic visit) :      Personal blood pressure goal: 140/90  (04/18/2009)     Personal LDL goal: 130  (04/18/2009)    Patient will work on the following items until the next clinic visit to reach self-care goals:     Medications and monitoring: take my medicines every day, check my blood pressure, bring all of my medications to every visit  (08/09/2009)     Eating: drink diet soda or water instead of juice or soda, eat more vegetables, use fresh or frozen  vegetables, eat foods that are low in salt, eat baked foods instead of fried foods, eat fruit for snacks and desserts  (08/09/2009)     Activity: take a 30 minute walk every day, park at the far end of the parking lot  (08/09/2009)    Hypertension self-management support: Written self-care plan  (07/01/2009)    Lipid self-management support: Written self-care plan  (07/01/2009)

## 2010-09-02 NOTE — Progress Notes (Signed)
Summary: Refill/gh  Phone Note Refill Request Message from:  Fax from Pharmacy on March 24, 2010 1:44 PM  Refills Requested: Medication #1:  FUROSEMIDE 40 MG TABS Take 1 tablet by mouth two times a day  Method Requested: Electronic Initial call taken by: Angelina Ok RN,  March 24, 2010 1:45 PM  Follow-up for Phone Call        completed refill, thank you Iskra  Follow-up by: Mliss Sax MD,  March 25, 2010 2:16 PM    Prescriptions: FUROSEMIDE 40 MG TABS (FUROSEMIDE) Take 1 tablet by mouth two times a day  #60 x 11   Entered and Authorized by:   Mliss Sax MD   Signed by:   Mliss Sax MD on 03/25/2010   Method used:   [facility] [address] [phone]* (retail)       [address]       [place], [place]  [address]       Ph: [phone] or [phone]       Fax: [phone]   RxID:   [id number]

## 2010-09-02 NOTE — Progress Notes (Signed)
Summary: refill/gg  Phone Note Refill Request  on June 25, 2010 1:59 PM  Refills Requested: Medication #1:  PRAVACHOL 20 MG TABS Take 1 tablet by mouth once a day  Medication #2:  NORVASC 10 MG TABS Take 1 tablet by mouth once a day  Medication #3:  IMDUR 120 MG XR24H-TAB Take 1 tablet by mouth once a day.  Medication #4:  VENTOLIN HFA 108 (90 BASE) MCG/ACT AERS 2 puffs inhaled every 4-6 hours as needed for wheezing last labs 6/11   Method Requested: Electronic Initial call taken by: Merrie Roof RN,  June 25, 2010 2:00 PM    Prescriptions: VENTOLIN HFA 108 (90 BASE) MCG/ACT AERS (ALBUTEROL SULFATE) 2 puffs inhaled every 4-6 hours as needed for wheezing  #1 x 6   Entered and Authorized by:   Zoila Shutter MD   Signed by:   Zoila Shutter MD on 06/25/2010   Method used:   [facility] to        [facility] [address] [phone]* (retail)       [address]       [place], [place]  [address]       Ph: [phone] or [phone]       Fax: [phone]   RxID:   [id number] IMDUR 120 MG XR24H-TAB (ISOSORBIDE MONONITRATE) Take 1 tablet by mouth once a day  #30 Tablet x 6   Entered and Authorized by:   Zoila Shutter MD   Signed by:   Zoila Shutter MD on 06/25/2010   Method used:   [facility]        [facility] [address] [phone]* (retail)       [address]       [place], [place]  [address]       Ph: [phone] or [phone]       Fax: [phone]   RxID:   [id number] NORVASC 10 MG TABS (AMLODIPINE BESYLATE) Take 1 tablet by mouth once a day  #30 Tablet x 6   Entered and Authorized by:   Zoila Shutter MD   Signed by:   Zoila Shutter MD on 06/25/2010   Method used:   [facility]ronically to        [facility] [address] [phone]* (retail)       [address]       [place], [place]  [address]       Ph: [phone] or [phone]       Fax: [phone]   RxID:    [id number] PRAVACHOL 20 MG TABS (PRAVASTATIN SODIUM) Take 1 tablet by mouth once a day  #30 x 6   Entered and Authorized by:   Zoila Shutter MD   Signed by:   Zoila Shutter MD on 06/25/2010   Method used:   [facility]ronically to        [facility] [address] [phone]* (retail)       [address]       [place], [place]  [address]       Ph: [phone] or [phone]       Fax: [phone]   RxID:   [id number]

## 2010-09-02 NOTE — Progress Notes (Signed)
Summary: Refill/gh  Phone Note Refill Request Message from:  Fax from Pharmacy on November 25, 2009 10:14 AM  Refills Requested: Medication #1:  FUROSEMIDE 20 MG  TABS Take 1 tablet by mouth twice a day   Last Refilled: 10/22/2009  Method Requested: Electronic Initial call taken by: Angelina Ok RN,  November 25, 2009 10:15 AM  Follow-up for Phone Call        completed refill, thank you Yanice Maqueda  Follow-up by: Mliss Sax MD,  November 27, 2009 8:59 AM    Prescriptions: FUROSEMIDE 20 MG  TABS (FUROSEMIDE) Take 1 tablet by mouth twice a day  #64 x 3   Entered and Authorized by:   Mliss Sax MD   Signed by:   Mliss Sax MD on 11/27/2009   Method used:   [facility]ically to        [facility] [address] [phone]* (retail)       [address]       [place], [place]  [address]       Ph: [phone] or [phone]       Fax: [phone]   RxID:   [id number]

## 2010-09-02 NOTE — Assessment & Plan Note (Signed)
Summary: 70month /fu reck bp/magick/vs   Vital Signs:  Patient profile:   70 year old female Height:      62.5 inches Weight:      313.7 pounds BMI:     56.67 Temp:     97.0 degrees F oral Pulse rate:   78 / minute BP sitting:   181 / 95  (right arm)  Vitals Entered By: Filomena Jungling NT II (September 23, 2009 8:40 AM)  Serial Vital Signs/Assessments:  Time      Position  BP       Pulse  Resp  Temp     By                     150/80                         Bethel Born MD  CC: BLOOD PRESSURE CHECK Is Patient Diabetic? No Pain Assessment Patient in pain? no      Nutritional Status BMI of > 30 = obese  Have you ever been in a relationship where you felt threatened, hurt or afraid?No   Does patient need assistance? Functional Status Self care Ambulation Normal   Primary Care Provider:  Mliss Sax MD  CC:  BLOOD PRESSURE CHECK.  History of Present Illness: 70 y/o woman with PMH of HTN and other medical problems listed in the EMR comes for a routine follow up visit for BP check. Last seen a month ago when Imdur was increased to 60 qd for BP control.   She has been compliant with meds. No new complaints. Not exercising or walking enough. Trying to control diet.     Preventive Screening-Counseling & Management  Alcohol-Tobacco     Alcohol drinks/day: 0     Smoking Status: quit     Year Quit: >40 yrs ago     Passive Smoke Exposure: yes  Caffeine-Diet-Exercise     Caffeine use/day: 1     Does Patient Exercise: yes     Type of exercise: WALKS     Exercise (avg: min/session):   30     Times/week: 1  Current Medications (verified): 1)  Furosemide 20 Mg  Tabs (Furosemide) .... Take 1 Tablet By Mouth Twice A Day 2)  Pravachol 20 Mg Tabs (Pravastatin Sodium) .... Take 1 Tablet By Mouth Once A Day 3)  Norvasc 10 Mg Tabs (Amlodipine Besylate) .... Take 1 Tablet By Mouth Once A Day 4)  Ventolin Hfa 108 (90 Base) Mcg/act Aers (Albuterol Sulfate) .... 2 Puffs Inhaled Every  4-6 Hours As Needed For Wheezing 5)  Caltrate 600+d Plus 600-400 Mg-Unit Tabs (Calcium Carbonate-Vit D-Min) .... Take 1 Tablet By Mouth Once A Day. 6)  Imdur 60 Mg Xr24h-Tab (Isosorbide Mononitrate) .... Take One Tablet Daily For Blood Pressure.  Allergies (verified): 1)  ! Asa 2)  ! Lisinopril 3)  ! * Crab But Not Shrimp  Review of Systems  The patient denies anorexia, fever, weight loss, weight gain, vision loss, decreased hearing, hoarseness, chest pain, syncope, dyspnea on exertion, peripheral edema, prolonged cough, headaches, hemoptysis, abdominal pain, melena, hematochezia, severe indigestion/heartburn, hematuria, incontinence, genital sores, muscle weakness, suspicious skin lesions, transient blindness, difficulty walking, depression, unusual weight change, abnormal bleeding, enlarged lymph nodes, angioedema, breast masses, and testicular masses.    Physical Exam  General:  alert and overweight-appearing.   Head:  normocephalic and atraumatic.   Eyes:  vision grossly intact, pupils equal, pupils  round, and pupils reactive to light.   Ears:  R ear normal and L ear normal.   Nose:  no external deformity.   Mouth:  good dentition.   Neck:  supple and no masses.   Lungs:  normal breath sounds, no fremitus, no crackles, and no wheezes.   Heart:  normal rate, regular rhythm, and no JVD.   Abdomen:  soft, non-tender, and normal bowel sounds.   Pulses:  dorsalis pedis pulses normal bilaterally  Extremities:  no edema Neurologic:  OrientedX3, cranial nerver 2-12 intact,strength good in all extremities, sensations normal to light touch, reflexes 2+ b/l, gait normal    Impression & Recommendations:  Problem # 1:  HYPERTENSION (ICD-401.9) BP control heading in right direction. Per Dr. Josem Kaufmann, a metoprolol or other beta blocker would make a better regimen then imdur. Patient did not tolerate atenolol in the past but another beta blocker could be tried. Patient does not want any change in  her medications today as she is tired of changing BP meds. Will keep everything as it is for now as BP well controlled although not at goal.   Her updated medication list for this problem includes:    Furosemide 20 Mg Tabs (Furosemide) .Marland Kitchen... Take 1 tablet by mouth twice a day    Norvasc 10 Mg Tabs (Amlodipine besylate) .Marland Kitchen... Take 1 tablet by mouth once a day    Imdur 60XR once daily   BP today: 150/80 Prior BP: 164/97 (08/22/2009)  Prior 10 Yr Risk Heart Disease: 15 % (06/04/2009)  Labs Reviewed: K+: 4.1 (07/25/2009) Creat: : 0.72 (07/25/2009)   Chol: 203 (04/18/2009)   HDL: 58 (04/18/2009)   LDL: 132 (04/18/2009)   TG: 66 (04/18/2009)  Problem # 2:  PREVENTIVE HEALTH CARE (ICD-V70.0) Td and Hemoccult cards today  Complete Medication List: 1)  Furosemide 20 Mg Tabs (Furosemide) .... Take 1 tablet by mouth twice a day 2)  Pravachol 20 Mg Tabs (Pravastatin sodium) .... Take 1 tablet by mouth once a day 3)  Norvasc 10 Mg Tabs (Amlodipine besylate) .... Take 1 tablet by mouth once a day 4)  Ventolin Hfa 108 (90 Base) Mcg/act Aers (Albuterol sulfate) .... 2 puffs inhaled every 4-6 hours as needed for wheezing 5)  Caltrate 600+d Plus 600-400 Mg-unit Tabs (Calcium carbonate-vit d-min) .... Take 1 tablet by mouth once a day. 6)  Imdur 60 Mg Xr24h-tab (Isosorbide mononitrate) .... Take one tablet daily for blood pressure.  Other Orders: T-Hemoccult Card-Multiple (take home) (16109) TD Toxoids IM 7 YR + (60454) Admin 1st Vaccine (09811)  Patient Instructions: 1)  Please schedule a follow-up appointment in 2 months.  2)  It is important that you exercise regularly at least 20 minutes 5 times a week. If you develop chest pain, have severe difficulty breathing, or feel very tired , stop exercising immediately and seek medical attention. 3)  You need to lose weight. Consider a lower calorie diet and regular exercise.  4)  Complete your Hemocult cards and return them soon.  Prevention &  Chronic Care Immunizations   Influenza vaccine: Not documented   Influenza vaccine deferral: Deferred  (05/07/2009)   Influenza vaccine due: 04/04/2011    Tetanus booster: 09/23/2009: Td   Tetanus booster due: 09/24/2019    Pneumococcal vaccine: Pneumovax (Medicare)  (07/01/2009)   Pneumococcal vaccine due: 07/01/2014    H. zoster vaccine: Not documented  Colorectal Screening   Hemoccult: Negative  (05/12/2006)   Hemoccult action/deferral: Ordered  (09/23/2009)    Colonoscopy:  Mild diverticulosis was found in the sigmoid colon.  This was otherwise a normal examination of the colon.   Retroflexed views in the rectum revealed no abnormalities.    The time to cecum =  1.5  minutes. The scope was then withdrawn (time =  7.5  min) from the patient and the procedure completed.  (11/27/2008)   Colonoscopy due: 12/2018  Other Screening   Pap smear:  Specimen Adequacy: Satisfactory for evaluation.   Interpretation/Result:Negative for intraepithelial Lesion or Malignancy.     (11/06/2008)   Pap smear due: 11/2011    Mammogram: No specific mammographic evidence of malignancy.  No significant changes compared to previous study.  Assessment: BIRADS 1.   (01/29/2009)   Mammogram due: 01/2010    DXA bone density scan: Not documented   DXA bone density action/deferral: Ordered  (04/18/2009)   Smoking status: quit  (09/23/2009)  Lipids   Total Cholesterol: 203  (04/18/2009)   Lipid panel action/deferral: Lipid Panel ordered   LDL: 132  (04/18/2009)   LDL Direct: Not documented   HDL: 58  (04/18/2009)   Triglycerides: 66  (04/18/2009)    SGOT (AST): 23  (04/18/2009)   BMP action: Ordered   SGPT (ALT): 17  (04/18/2009)   Alkaline phosphatase: 111  (04/18/2009)   Total bilirubin: 0.5  (04/18/2009)    Lipid flowsheet reviewed?: Yes   Progress toward LDL goal: Improved  Hypertension   Last Blood Pressure: 181 / 95  (09/23/2009)   Serum creatinine: 0.72  (07/25/2009)   BMP  action: Ordered   Serum potassium 4.1  (07/25/2009)    Hypertension flowsheet reviewed?: Yes   Progress toward BP goal: Deteriorated  Self-Management Support :   Personal Goals (by the next clinic visit) :      Personal blood pressure goal: 140/90  (04/18/2009)     Personal LDL goal: 130  (04/18/2009)    Patient will work on the following items until the next clinic visit to reach self-care goals:     Medications and monitoring: take my medicines every day, bring all of my medications to every visit  (09/23/2009)     Eating: drink diet soda or water instead of juice or soda, eat more vegetables, use fresh or frozen vegetables, eat foods that are low in salt, eat baked foods instead of fried foods, eat fruit for snacks and desserts  (09/23/2009)     Activity: take a 30 minute walk every day, park at the far end of the parking lot  (09/23/2009)    Hypertension self-management support: Written self-care plan  (07/01/2009)    Lipid self-management support: Written self-care plan  (07/01/2009)    Nursing Instructions: Give tetanus booster today Provide Hemoccult cards with instructions (see order)       Immunizations Administered:  Tetanus Vaccine:    Vaccine Type: Td    Site: right deltoid    Mfr: Sanofi Pasteur    Dose: 0.5 ml    Route: IM    Given by: Dorie Rank RN    Exp. Date: 06/18/2011    Lot #: [id number]    VIS given: 06/21/07 version given September 23, 2009.

## 2010-09-23 ENCOUNTER — Other Ambulatory Visit: Payer: Self-pay | Admitting: Internal Medicine

## 2010-09-23 ENCOUNTER — Ambulatory Visit (INDEPENDENT_AMBULATORY_CARE_PROVIDER_SITE_OTHER): Payer: PRIVATE HEALTH INSURANCE | Admitting: Internal Medicine

## 2010-09-23 ENCOUNTER — Ambulatory Visit: Payer: Self-pay | Admitting: Internal Medicine

## 2010-09-23 ENCOUNTER — Ambulatory Visit (HOSPITAL_COMMUNITY)
Admission: RE | Admit: 2010-09-23 | Discharge: 2010-09-23 | Disposition: A | Discharge status: Home / Self Care | Payer: PRIVATE HEALTH INSURANCE | Source: Ambulatory Visit | Attending: Cardiology | Admitting: Cardiology

## 2010-09-23 ENCOUNTER — Encounter: Payer: Self-pay | Admitting: Internal Medicine

## 2010-09-23 DIAGNOSIS — R079 Chest pain, unspecified: Secondary | ICD-10-CM | POA: Insufficient documentation

## 2010-09-23 DIAGNOSIS — I4949 Other premature depolarization: Secondary | ICD-10-CM | POA: Insufficient documentation

## 2010-09-23 DIAGNOSIS — E785 Hyperlipidemia, unspecified: Secondary | ICD-10-CM

## 2010-09-23 DIAGNOSIS — I10 Essential (primary) hypertension: Secondary | ICD-10-CM

## 2010-09-23 DIAGNOSIS — R7309 Other abnormal glucose: Secondary | ICD-10-CM

## 2010-09-23 DIAGNOSIS — I517 Cardiomegaly: Secondary | ICD-10-CM | POA: Insufficient documentation

## 2010-09-23 LAB — COMPREHENSIVE METABOLIC PANEL
AST: 22 U/L (ref 0–37)
Alkaline Phosphatase: 109 U/L (ref 39–117)
BUN: 8 mg/dL (ref 6–23)
Glucose, Bld: 124 mg/dL — ABNORMAL HIGH (ref 70–99)
Sodium: 142 mEq/L (ref 135–145)
Total Bilirubin: 0.4 mg/dL (ref 0.3–1.2)

## 2010-09-23 LAB — POCT GLYCOSYLATED HEMOGLOBIN (HGB A1C): Hemoglobin A1C: 5.6

## 2010-09-23 LAB — CK TOTAL AND CKMB (NOT AT ARMC)
CK, MB: 1 ng/mL (ref 0.3–4.0)
Total CK: 58 U/L (ref 7–177)

## 2010-09-23 LAB — LIPID PANEL
Cholesterol: 199 mg/dL (ref 0–200)
Total CHOL/HDL Ratio: 3.9 Ratio
Triglycerides: 85 mg/dL (ref <150)

## 2010-09-23 LAB — TROPONIN I: Troponin I: 0.02 ng/mL (ref <0.06)

## 2010-09-23 MED ORDER — METOPROLOL TARTRATE 50 MG PO TABS: 50.0000 mg | ORAL_TABLET | Freq: Two times a day (BID) | ORAL | Status: DC

## 2010-09-23 NOTE — Assessment & Plan Note (Signed)
Blood pressure is above goal today. She reports compliance with her medications. She is allergic to lisinopril. And currently her medications include amlodipine and Lasix as well as imdur.  We'll initiate metoprolol 50 mg twice daily. Patient was advised to continue monitoring her blood pressure at home and to call us back if her numbers are higher than 160/90.

## 2010-09-23 NOTE — Assessment & Plan Note (Signed)
This is a notice in the past. Diet was discussed with the patient.  Will check A1c today to ensure that patient is nondiabetic.

## 2010-09-23 NOTE — Assessment & Plan Note (Signed)
We will check a fasting lipid profile today and adjust medication regimen is indicated.

## 2010-09-23 NOTE — Progress Notes (Signed)
Subjective:    Patient ID: Tina Chase, female    DOB: [birth year], 70 y.o.   MRN: [id number]  HPI   patient is a 70 year old female past medical history of hypertension, hyperlipidemia, obesity who presents to clinic with main concern occasional, mild, substernal chest pain. She reports that pain occurs randomly and last episode occurred last night, she does report having similar episodes over the past one to 2 months. Each episode lasts for about 1-2 minutes and completely resolves, no specific radiation to back or left arm. Episodes are more frequent at nighttime and there was no specific correlation with food intake or position change. There is no specific aggravating or alleviating factors since a pain only lasts for brief period of time. She does not recall similar episodes prior to 2 months ago am not sure what started this pain. She reports that pain can happen at rest or while walking but for sure worse at nighttime. She denies recent sicknesses or hospitalizations. No systemic symptoms of fevers, chills, weight loss, night sweats. No specific abdominal or urinary concerns, no blood in urine or stool, no diarrhea or constipation. Patient also denies episodes of weaknesses, dizziness, changes in vision, changes in weight.  Review of Systems  Constitutional: Negative.   HENT: Negative.   Eyes: Negative.   Respiratory: Negative.   Cardiovascular: Positive for chest pain. Negative for palpitations and leg swelling.  Gastrointestinal: Negative.   Genitourinary: Negative.   Musculoskeletal: Negative.   Neurological: Negative.   Hematological: Negative.   Psychiatric/Behavioral: Negative.        Objective:   Physical Exam  Constitutional: She appears well-developed and well-nourished. No distress.  HENT:  Head: Normocephalic and atraumatic.  Right Ear: External ear normal.  Left Ear: External ear normal.  Nose: Nose normal.  Mouth/Throat: Oropharynx is clear and moist. No  oropharyngeal exudate.  Eyes: Conjunctivae and EOM are normal. Pupils are equal, round, and reactive to light. Left eye exhibits no discharge.  Neck: Normal range of motion. Neck supple. No JVD present. No tracheal deviation present. No thyromegaly present.  Cardiovascular: Regular rhythm, S1 normal, S2 normal, intact distal pulses and normal pulses.  Tachycardia present.  PMI is not displaced.  Exam reveals no gallop, no S3, no S4, no friction rub and no decreased pulses.   Murmur heard.  Systolic murmur is present with a grade of 3/6  Pulses:      Carotid pulses are on the right side with bruit, and on the left side with bruit.      Radial pulses are 2+ on the right side, and 2+ on the left side.       Dorsalis pedis pulses are 2+ on the right side, and 2+ on the left side.       Posterior tibial pulses are 2+ on the right side, and 2+ on the left side.  Pulmonary/Chest: Effort normal and breath sounds normal. No respiratory distress. She has no wheezes. She has no rales. She exhibits no tenderness.  Abdominal: Soft. Bowel sounds are normal. She exhibits no distension and no mass. There is no tenderness. There is no rebound and no guarding.  Musculoskeletal: Normal range of motion. She exhibits edema. She exhibits no tenderness.  Lymphadenopathy:    She has no cervical adenopathy.  Skin: Skin is warm and dry. No rash noted. She is not diaphoretic. No erythema. No pallor.  Psychiatric: She has a normal mood and affect. Her behavior is normal. Judgment and thought content  normal.          Assessment & Plan:

## 2010-09-23 NOTE — Assessment & Plan Note (Signed)
We'll check calcium levels today to ensure that number is within normal limits

## 2010-09-23 NOTE — Assessment & Plan Note (Signed)
Unclear etiology of pain.  Worrisome for acute coronary syndrome. Patient does have risk factors of hypertension, hyperlipidemia, and family history of coronary heart disease in her brother and father at age 68. I will obtain 12-lead EKG, we'll also check electrolyte panels including liver function tests. Will also check fasting lipid panel. Certainly the pain could be musculoskeletal in etiology. I will discuss this case with attending in order to decide if patient needs to be admitted for further evaluation.  I will also obtain a set of cardiac enzymes.

## 2010-10-08 ENCOUNTER — Encounter: Payer: Self-pay | Admitting: Cardiology

## 2010-10-08 ENCOUNTER — Institutional Professional Consult (permissible substitution) (INDEPENDENT_AMBULATORY_CARE_PROVIDER_SITE_OTHER): Payer: PRIVATE HEALTH INSURANCE | Admitting: Cardiology

## 2010-10-08 DIAGNOSIS — R0602 Shortness of breath: Secondary | ICD-10-CM | POA: Insufficient documentation

## 2010-10-08 DIAGNOSIS — R000 Tachycardia, unspecified: Secondary | ICD-10-CM | POA: Insufficient documentation

## 2010-10-08 DIAGNOSIS — R079 Chest pain, unspecified: Secondary | ICD-10-CM | POA: Insufficient documentation

## 2010-10-08 DIAGNOSIS — E785 Hyperlipidemia, unspecified: Secondary | ICD-10-CM

## 2010-10-08 DIAGNOSIS — I10 Essential (primary) hypertension: Secondary | ICD-10-CM

## 2010-10-14 NOTE — Assessment & Plan Note (Signed)
Summary: consult: chestpain. ekg done at [facility]. per [facility] office [phone]...   Visit Type:  Initial Consult Primary Provider:  Mliss Sax MD  CC:  Chest Pain and Dyspnea.  History of Present Illness: The patient presents as a new patient.  She has no past cardiac history although she reports echocardiograms in the late 90s for evaluation of dyspnea. She was told she did not have heart failure. She was referred from primary care practice at [facility] for evaluation of chest pain and dyspnea. She reports shortness of breath slowly progressive over the years. She will get short of breath walking a short incline. She does not describe PND or orthopnea. She does note lower extremity edema. She has had slowly progressive weight gain over the years with morbid obesity now.  She has been having chest discomfort as well. This is sharp and left-sided for the last month. It is intermittent. She does not describe associated symptoms or radiation to her jaw or arms. She does not describe associated nausea vomiting or diaphoresis. She is not describing palpitations, presyncope or syncope.  Current Medications (verified): 1)  Furosemide 40 Mg Tabs (Furosemide) .... Take 1 Tablet By Mouth Two Times A Day 2)  Pravachol 20 Mg Tabs (Pravastatin Sodium) .... Take 1 Tablet By Mouth Once A Day 3)  Norvasc 10 Mg Tabs (Amlodipine Besylate) .... Take 1 Tablet By Mouth Once A Day 4)  Ventolin Hfa 108 (90 Base) Mcg/act Aers (Albuterol Sulfate) .... 2 Puffs Inhaled Every 4-6 Hours As Needed For Wheezing 5)  Imdur 120 Mg Xr24h-Tab (Isosorbide Mononitrate) .... Take 1 Tablet By Mouth Once A Day 6)  Metoprolol Tartrate 50 Mg Tabs (Metoprolol Tartrate) .... Take One Tablet By Mouth Twice A Day 7)  Acetaminophen 500 Mg Tabs (Acetaminophen) .... 2-3 Tabs At Bedtime  Allergies (verified): 1)  ! Asa 2)  ! Lisinopril 3)  ! * Crab But Not Shrimp 4)  ! * Hydralazine  Past History:  Past Medical History: HYPERTENSION HYPERLIPIDEMIA   NEED PROPHYLACTIC VACCINATION&INOCULATION FLU  FASTING HYPERGLYCEMIA HYPERPARATHYROIDISM NOS HYPERCALCEMIA, resolved off of HCTZ OBESITY  Past Surgical History: Tubal ligation.  Family History: Father: heart attack at 41 Mother: HTN, LVH Brother: heart attck at 32  Social History: Lves alone, recently retired Lawyer (retired 4 months ago).  Smoked a few cigs along time ago.  Review of Systems       As stated in the HPI and negative for all other systems.   Vital Signs:  Patient profile:   70 year old female Height:      62.5 inches Weight:      324.25 pounds BMI:     58.57 Pulse rate:   107 / minute BP sitting:   157 / 100  (left arm) Cuff size:   large  Vitals Entered By: Caralee Ates CMA (October 08, 2010 3:52 PM)  Physical Exam  General:  Well developed, well nourished, in no acute distress. Head:  normocephalic and atraumatic Eyes:  PERRLA/EOM intact; conjunctiva and lids normal. Mouth:  Teeth, gums and palate normal. Oral mucosa normal. Neck:  Neck supple, no JVD. No masses, thyromegaly or abnormal cervical nodes. Chest Wall:  no deformities or breast masses noted Lungs:  Clear bilaterally to auscultation and percussion. Abdomen:  Morbidly obese.  No rebound, no guarding.  Unable to appreciate  organomegaly because of morbid obesity Msk:  Back normal, normal gait. Muscle strength and tone normal. Pulses:  pulses normal in all 4 extremities Extremities:  mild  bilateral lower extremity edema Neurologic:  Alert and oriented x 3. Skin:  Intact without lesions or rashes. Cervical Nodes:  no significant adenopathy Psych:  Normal affect.   Detailed Cardiovascular Exam  Neck    Carotids: Carotids full and equal bilaterally without bruits.      Neck Veins: Normal, no JVD.    Heart    Inspection: no deformities or lifts noted.      Palpation: normal PMI with no thrills palpable.      Auscultation: regular rate and rhythm, S1, S2 without murmurs, rubs, gallops, or  clicks.    Vascular    Abdominal Aorta: no palpable masses, pulsations, or audible bruits.      Femoral Pulses: Unable to appreciate with obesity    Pedal Pulses: normal pedal pulses bilaterally.      Radial Pulses: normal radial pulses bilaterally.      Peripheral Circulation: no clubbing, cyanosis, or edema noted with normal capillary refill.     EKG  Procedure date:  10/08/2010  Findings:      sinus rhythm, rate 103, left axis deviation, premature atrial contraction, and QTC prolonged  Impression & Recommendations:  Problem # 1:  CHEST PAIN (ICD-786.50) Because of multiple cardiovascular risk factors she needs stress testing. She could not walk on a treadmill and so she will need pharmacologic perfusion imaging  Problem # 2:  DYSPNEA (ICD-786.05) I will check an echo and BNP.  Certainly her weight is contributing Orders: Nuclear Stress Test (Nuc Stress Test) Echocardiogram (Echo)  Problem # 3:  HYPERTENSION (ICD-401.9) Her  blood pressure is not controlled. I will add Cozaar 25 mg daily and check a basic metabolic profile in 2 weeks.  Problem # 4:  HYPERLIPIDEMIA (ICD-272.4) Her cholesterol LDL was 131. I reviewed extensively all of the records available. I will increase her prednisone to 80 mg daily. This should be repeated in about 8 weeks.  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 I will check a TSH.  Patient Instructions: 1)  Your physician recommends that you schedule a follow-up appointment after testing 2)  Your physician recommends that you return for lab work in: 2 weeks  for TSH, BMP and BNP  401.1 786.05 272.4 v58.69 3)  Your physician has recommended you make the following change in your medication: increase Pravastain to 80 mg daily and start Losartan 25 mg once a  day 4)  Your physician has requested that you have an adenosine myoview.  For further information please visit [email or web address]  Please follow instruction sheet, as given. 5)  Your physician has requested that you have an echocardiogram.  Echocardiography is a painless test that uses sound waves to create images of your heart. It provides your doctor with information about the size and shape of your heart and how well your heart's chambers and valves are working.  This procedure takes approximately one hour. There are no restrictions for this procedure. Prescriptions: LOSARTAN POTASSIUM 25 MG TABS (LOSARTAN POTASSIUM) once daily  #30 x 11   Entered by:   Charolotte Capuchin, RN   Authorized by:   Rollene Rotunda, MD, [facility]   Signed by:   Charolotte Capuchin, RN on 10/08/2010   Method used:   [facility]        [id number] (retail)       [address]       [place], [place]  [address]       Ph: [phone] or [phone]       Fax: [phone]   RxID:   [id number] PRAVASTATIN SODIUM 80 MG TABS (PRAVASTATIN SODIUM) once daily  #30 x 11   Entered by:   Charolotte Capuchin, RN   Authorized by:   Rollene Rotunda, MD, [facility]   Signed by:   Charolotte Capuchin, RN on 10/08/2010   Method used:   [facility]        [facility] [address] [phone][facility] (retail)       [address]       [place], [place]  [address]       Ph: [phone] or [phone]       Fax: [phone]   RxID:   [id number]

## 2010-10-22 ENCOUNTER — Other Ambulatory Visit (HOSPITAL_COMMUNITY): Payer: Self-pay | Admitting: Cardiology

## 2010-10-22 DIAGNOSIS — R0600 Dyspnea, unspecified: Secondary | ICD-10-CM

## 2010-10-23 ENCOUNTER — Ambulatory Visit (HOSPITAL_COMMUNITY): Payer: PRIVATE HEALTH INSURANCE | Attending: Cardiology | Admitting: Radiology

## 2010-10-23 ENCOUNTER — Telehealth: Payer: Self-pay | Admitting: *Deleted

## 2010-10-23 ENCOUNTER — Ambulatory Visit (INDEPENDENT_AMBULATORY_CARE_PROVIDER_SITE_OTHER): Payer: PRIVATE HEALTH INSURANCE | Admitting: *Deleted

## 2010-10-23 DIAGNOSIS — E785 Hyperlipidemia, unspecified: Secondary | ICD-10-CM

## 2010-10-23 DIAGNOSIS — R0600 Dyspnea, unspecified: Secondary | ICD-10-CM

## 2010-10-23 DIAGNOSIS — R0602 Shortness of breath: Secondary | ICD-10-CM

## 2010-10-23 DIAGNOSIS — R0609 Other forms of dyspnea: Secondary | ICD-10-CM | POA: Insufficient documentation

## 2010-10-23 DIAGNOSIS — I10 Essential (primary) hypertension: Secondary | ICD-10-CM

## 2010-10-23 DIAGNOSIS — R0989 Other specified symptoms and signs involving the circulatory and respiratory systems: Secondary | ICD-10-CM | POA: Insufficient documentation

## 2010-10-23 DIAGNOSIS — R079 Chest pain, unspecified: Secondary | ICD-10-CM | POA: Insufficient documentation

## 2010-10-23 DIAGNOSIS — Z79899 Other long term (current) drug therapy: Secondary | ICD-10-CM

## 2010-10-23 DIAGNOSIS — E213 Hyperparathyroidism, unspecified: Secondary | ICD-10-CM | POA: Insufficient documentation

## 2010-10-23 LAB — TSH: TSH: 1.71 u[IU]/mL (ref 0.35–5.50)

## 2010-10-23 LAB — BASIC METABOLIC PANEL
Calcium: 9.6 mg/dL (ref 8.4–10.5)
GFR: 87.92 mL/min (ref >60.00)
Glucose, Bld: 117 mg/dL — ABNORMAL HIGH (ref 70–99)
Potassium: 3.3 mEq/L — ABNORMAL LOW (ref 3.5–5.1)
Sodium: 137 mEq/L (ref 135–145)

## 2010-10-23 LAB — BRAIN NATRIURETIC PEPTIDE: Pro B Natriuretic peptide (BNP): 11.2 pg/mL (ref 0.0–100.0)

## 2010-10-23 MED ORDER — POTASSIUM CHLORIDE CRYS ER 20 MEQ PO TBCR: 20.0000 meq | EXTENDED_RELEASE_TABLET | Freq: Every day | ORAL | Status: DC

## 2010-10-23 MED ORDER — TECHNETIUM TC 99M TETROFOSMIN IV KIT
33.0000 | PACK | Freq: Once | INTRAVENOUS | Status: AC | PRN
  Administered 2010-10-23: 33 via INTRAVENOUS

## 2010-10-23 MED ORDER — REGADENOSON 0.4 MG/5ML IV SOLN
0.4000 mg | Freq: Once | INTRAVENOUS | Status: AC
  Administered 2010-10-23: 0.4 mg via INTRAVENOUS

## 2010-10-23 MED ORDER — REGADENOSON 0.4 MG/5ML IV SOLN: 0.4000 mg | Freq: Once | INTRAVENOUS | Status: DC

## 2010-10-23 NOTE — Telephone Encounter (Signed)
Pt aware of results and need to start potassium chloride 20 mEq daily.  [facility] [address] and [address].  Pt aware to repeat lab (BMP) in 1 week.

## 2010-10-23 NOTE — Progress Notes (Addendum)
[facility] SITE 3 NUCLEAR MED [address] [place] [address] [phone]  Cardiology Nuclear Med Study Tina Chase female 06-15-1941   Nuclear Med Background Indication for Stress Test:  Evaluation for Ischemia History:  Asthma and 1990's- Echo Normal per pt. Cardiac Risk Factors: Family History - CAD, History of Smoking, Hypertension, Lipids and Obesity  Symptoms:  Chest Pain (last date of chest discomfort 1 week ago), DOE, Palpitations, Rapid HR and SOB   Nuclear Pre-Procedure Caffeine/Decaff Intake:  None NPO After: 9:30pm   Lungs:  Clear IV 0.9% NS with Angio Cath:  22g  IV Site: R Antecubital  IV Started by:  Irean Hong, RN  Chest Size (in):  48 Cup Size:  DD  Height: 5' 2.5" (1.588 m)  Weight:  322 lb (146.058 kg)  BMI:  Body mass index is 57.96 kg/(m^2). Tech Comments:  Held metoprolol this am    Nuclear Med Study 1 or 2 day study: 2 day  Stress Test Type:  Lexiscan  Reading MD: Marca Ancona, MD  Order Authorizing Provider:  Dr. Daiva Nakayama  Resting Radionuclide: Technetium 62m Tetrofosmin  Resting Radionuclide Dose: 33 mCi   Stress Radionuclide:  Technetium 85m Tetrofosmin  Stress Radionuclide Dose: 33 mCi           Stress Protocol Rest HR: 92 Stress HR: 123  Rest BP: 139/50 Stress BP: 160/82  Exercise Time:  N/A  min METS: N/A  Predicted HR: 152 % of Maximum: 80.9    Predicted Max HR: 152 bpm % Max HR: 80.92 bpm Rate Pressure Product: 95621    Dose of Adenosine:  N/A mg Dose of Lexiscan: 0.4 mg  Dose of Atropine:  N/A mg Dose of Dobutamine:  N/A mcg/kg/min (at max HR)  Stress Test Technologist: Bonnita Levan, RN  Nuclear Technologist:  Domenic Polite, CNMT     Rest Procedure:  Myocardial perfusion imaging was performed at rest 45 minutes following the intravenous administration of Technetium 28m Tetrofosmin. Rest ECG: NSR with occ. PVC's.  Stress Procedure:  The patient received IV Lexiscan 0.4 mg over 15-seconds.   Technetium 19m Tetrofosmin injected at 30-seconds.The patient experienced chest tightness and dyspnea with injection, which resolved in recovery.  Non-Specific ST-T changes with injection.  Quantitative spect images were obtained after a 45 minute delay. Stress ECG: No significant ST segment change suggestive of ischemia.  QPS Raw Data Images:  Normal; no motion artifact; normal heart/lung ratio. Stress Images:  There is decreased uptake in the apex. Rest Images:  There is decreased uptake in the apex. Subtraction (SDS):  There is a fixed defect that is most consistent with a previous infarction.There is partial reversiblilty consistent with ischemia.  Findings Risk Category:  Moderate risk nuclear study. Clinically Abnormal:  Chest Pain Ischemia:  Anterior (septal apical) Fixed Defect:  Anterior (septal apical) LV Dysfunction:  No Transient Ischemic Dilatation (Normal <1.22):  0.88 Lung/Heart Ratio (Normal <0.45):  0.39  Quantitative Gated Spect Images QGS EDV:  111 ml QGS ESV:  49 ml QGS cine images:  No segmental wall motion abnormalities. QGS EF:  56 %  Impression Exercise Capacity:  Lexiscan with no exercise. BP Response:  Normal blood pressure response. Clinical Symptoms:  Mild chest pain/dyspnea. ECG Impression:  No significant ST segment change suggestive of ischemia. Comparison with Prior Nuclear Study: No previous nuclear study performed  Overall Impression:  Abnormal stress nuclear study.  There is evidence of probable apical scar with surrounding reversible ischemia.  This  is a low risk scan.  The apical defect might represent artifact as she is very heavy.  However, given her cardiovascular risk factors I will need to see her back to review her symptoms and consider further evaluation.

## 2010-10-28 ENCOUNTER — Ambulatory Visit (HOSPITAL_COMMUNITY): Payer: PRIVATE HEALTH INSURANCE | Attending: Cardiology | Admitting: Radiology

## 2010-10-28 DIAGNOSIS — R0789 Other chest pain: Secondary | ICD-10-CM

## 2010-10-28 DIAGNOSIS — R0609 Other forms of dyspnea: Secondary | ICD-10-CM

## 2010-10-28 DIAGNOSIS — R079 Chest pain, unspecified: Secondary | ICD-10-CM

## 2010-10-28 MED ORDER — TECHNETIUM TC 99M TETROFOSMIN IV KIT: 33.0000 | PACK | Freq: Once | INTRAVENOUS | Status: AC | PRN

## 2010-10-28 MED ORDER — TECHNETIUM TC 99M TETROFOSMIN IV KIT
33.0000 | PACK | Freq: Once | INTRAVENOUS | Status: AC | PRN
  Administered 2010-10-28: 33 via INTRAVENOUS

## 2010-11-04 ENCOUNTER — Telehealth: Payer: Self-pay | Admitting: Cardiology

## 2010-11-04 ENCOUNTER — Other Ambulatory Visit (INDEPENDENT_AMBULATORY_CARE_PROVIDER_SITE_OTHER): Payer: PRIVATE HEALTH INSURANCE | Admitting: *Deleted

## 2010-11-04 DIAGNOSIS — I10 Essential (primary) hypertension: Secondary | ICD-10-CM

## 2010-11-04 NOTE — Progress Notes (Signed)
Pt aware of results.  Has f/u scheduled for 11/25/2010

## 2010-11-04 NOTE — Telephone Encounter (Signed)
Pt rtn call re test results °

## 2010-11-04 NOTE — Telephone Encounter (Signed)
Pt aware of results and a follow appt was scheduled for her 4/24 at 9am

## 2010-11-04 NOTE — Progress Notes (Signed)
Pt aware of results and a follow up appt was scheduled for her for 4/24 at 9am

## 2010-11-05 LAB — BASIC METABOLIC PANEL
Calcium: 9.7 mg/dL (ref 8.4–10.5)
Chloride: 101 mEq/L (ref 96–112)
Creatinine, Ser: 0.6 mg/dL (ref 0.4–1.2)
GFR: 135.64 mL/min (ref >60.00)

## 2010-11-22 ENCOUNTER — Encounter: Payer: Self-pay | Admitting: Cardiology

## 2010-11-25 ENCOUNTER — Ambulatory Visit (INDEPENDENT_AMBULATORY_CARE_PROVIDER_SITE_OTHER): Payer: PRIVATE HEALTH INSURANCE | Admitting: Cardiology

## 2010-11-25 ENCOUNTER — Encounter: Payer: Self-pay | Admitting: Cardiology

## 2010-11-25 DIAGNOSIS — E785 Hyperlipidemia, unspecified: Secondary | ICD-10-CM

## 2010-11-25 DIAGNOSIS — R079 Chest pain, unspecified: Secondary | ICD-10-CM

## 2010-11-25 DIAGNOSIS — I10 Essential (primary) hypertension: Secondary | ICD-10-CM

## 2010-11-25 NOTE — Patient Instructions (Signed)
Follow up with Dr Antoine Poche in 6 months Continue current medications

## 2010-11-25 NOTE — Assessment & Plan Note (Signed)
She will not take her statin because of muscle aches. This was prior to statin which should cause but she was symptom. The only thing left but I think would be of benefit would be dialyzed changes

## 2010-11-25 NOTE — Assessment & Plan Note (Signed)
We had another long discussion about this and I gave her very specific instructions.

## 2010-11-25 NOTE — Assessment & Plan Note (Signed)
Her blood pressure is not at target but I'm not sure she is taking her medications. She didn't this morning. We discussed the importance of this and I will not change her medications at this point. She needs to keep a blood pressure diary.

## 2010-11-25 NOTE — Progress Notes (Deleted)
   Patient ID: Tina Chase, female    DOB: 07-31-1941, 70 y.o.   MRN: [id number]  HPI    Review of Systems    Physical Exam

## 2010-11-25 NOTE — Assessment & Plan Note (Signed)
At this point I don't think that further invasive imaging is indicated given the very atypical nature of her symptoms and the low risk perfusion study. Rather she needs primary risk reduction.

## 2010-11-25 NOTE — Progress Notes (Signed)
HPI The patient presents for followup of chest pain. 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 The ejection fraction was well preserved. Overall it was a low risk study. I brought her back to discuss this. She says she's not having any substernal chest pain. She is getting some discomfort under the left breast but she's not having any new neck or arm discomfort. She is having no new shortness of breath, PND or orthopnea. She has no weight gain or edema. Back she lost a pound or 2. Allergies  Allergen Reactions  . Hydralazine Anaphylaxis    Swelling tongue.  . Aspirin     REACTION: hives  . Lisinopril     REACTION: angioedema    Current Outpatient Prescriptions  Medication Sig Dispense Refill  . acetaminophen (TYLENOL) 500 MG tablet Take by mouth. 2-3 tablets at bedtime       . albuterol (VENTOLIN HFA) 108 (90 BASE) MCG/ACT inhaler Inhale 2 puffs into the lungs. Every 4-6 hours as needed for wheezing.       Marland Kitchen amLODipine (NORVASC) 10 MG tablet Take 10 mg by mouth daily.        . furosemide (LASIX) 40 MG tablet Take 40 mg by mouth 2 (two) times daily.        . isosorbide mononitrate (IMDUR) 120 MG 24 hr tablet Take 120 mg by mouth daily.        Marland Kitchen losartan (COZAAR) 25 MG tablet Take 25 mg by mouth daily.        . metoprolol (LOPRESSOR) 50 MG tablet Take 1 tablet (50 mg total) by mouth 2 (two) times daily.  60 tablet  11  . potassium chloride SA (K-DUR,KLOR-CON) 20 MEQ tablet Take 1 tablet (20 mEq total) by mouth daily.  30 tablet  11  . DISCONTD: pravastatin (PRAVACHOL) 20 MG tablet Take 20 mg by mouth daily.        Marland Kitchen DISCONTD: pravastatin (PRAVACHOL) 80 MG tablet Take 80 mg by mouth daily.          Past Medical History  Diagnosis Date  . Hyperlipidemia   . Hypertension   . Hyperparathyroidism   . Obesity     Past Surgical History  Procedure Date  . Tubal ligation     ROS:.  As stated in the HPI and negative  for all other systems.  PHYSICAL EXAM BP 180/96  Pulse 84  Ht 5\' 4"  (1.626 m)  Wt 319 lb (144.697 kg)  BMI 54.76 kg/m2  LMP 09/23/1986 GENERAL:  Well appearing HEENT:  Pupils equal round and reactive, fundi not visualized, oral mucosa unremarkable NECK:  No jugular venous distention, waveform within normal limits, carotid upstroke brisk and symmetric, no bruits, no thyromegaly LYMPHATICS:  No cervical, inguinal adenopathy LUNGS:  Clear to auscultation bilaterally BACK:  No CVA tenderness CHEST:  Unremarkable HEART:  PMI not displaced or sustained,S1 and S2 within normal limits, no S3, no S4, no clicks, no rubs, no murmurs ABD:  Flat, positive bowel sounds normal in frequency in pitch, no bruits, no rebound, no guarding, no midline pulsatile mass, no hepatomegaly, no splenomegaly, morbidly obese EXT:  2 plus pulses throughout, no edema, no cyanosis no clubbing, nonpitting edema SKIN:  No rashes no nodules NEURO:  Cranial nerves II through XII grossly intact, motor grossly intact throughout PSYCH:  Cognitively intact, oriented to person place and time  ASSESSMENT AND PLAN

## 2010-12-19 NOTE — H&P (Signed)
Tina Chase, Tina Chase                            ACCOUNT NO.:  [id number]   MEDICAL RECORD NO.:  [id number]                   PATIENT TYPE:  EMS   LOCATION:  [facility]                                 FACILITY:  [facility]   PHYSICIAN:  Valetta Fuller, M.D.               DATE OF BIRTH:  10-21-1940   DATE OF ADMISSION:  05/07/2002  DATE OF DISCHARGE:                                HISTORY & PHYSICAL   CHIEF COMPLAINT:  Right flank pain with nausea and vomiting and large  ureteral stone on CT scan.   HISTORY OF PRESENT ILLNESS:  The patient is a 70 year old African-American  female.  She denies a previous urologic history and has had no diagnosis of  ureteral calculi or renal calculi in the past.  She denies a family history  as well.  The patient states that for at least a couple of years she has had  some intermittent flank discomfort.  She tells me that primary care  physicians have checked her blood and have told her that her kidneys are  fine.  It does not appear that she has ever had any imaging studies.  She  has had some sporadic urinary tract infections in the past, but it does not  appear that she has ever had obvious pyelonephritis.  She has had some  chronic back discomfort as well and works as a Agricultural engineer.  With her  work she often has some back discomfort.  Over the weekend she developed  significant flank pain with radiation across her abdomen.  She was having  some nausea and emesis and also some increased irritative voiding symptoms.  She presented to the [facility] Emergency Room.  There, a CT scan was performed,  and the official report is right hydronephrosis with a 10 mm stone at her  right ureterovesical junction as well as two larger stones within her right  kidney.  On my evaluation of her CT, there is also significant atrophic  change of her right kidney.  The left kidney is significantly larger than  the right, and the right appears to be at least moderately to  severely  atrophic.  I agree that there are two large stones within her right kidney  and also what appears to be a large stone right at the ureterovesical  junction.   PAST MEDICAL HISTORY:  Hypertension, for which she takes Altace 5 mg a day  and hydrochlorothiazide.   ALLERGIES:  She tells me she has an allergy to ASPIRIN.   SOCIAL HISTORY:  She is a nonsmoker, nondrinker.   FAMILY HISTORY:  Negative for stones.   PAST SURGICAL HISTORY:  Unremarkable, other than a tubal ligation.   PHYSICAL EXAMINATION:  GENERAL:  Obese female.  She is slightly lethargic  from her pain medication but is able to answer questions and appears  oriented.  She is nontoxic in appearance.  VITAL  SIGNS:  Blood pressure on admission was 192/101 with a pulse of 111,  although she was having excruciating discomfort at that time.  She has been  afebrile.   LABORATORY DATA:  Additional laboratories are pending at this point.   ASSESSMENT:  Right flank pain.  The patient has a large stone in her right  distal ureter.  I explained to the patient that a 10 mm stone will only pass  10% of the time or less.  She has significant hydronephrosis, and I think  continued efforts to try to pass this spontaneously are probably not  warranted.  In addition, there are several other problems.  She has several  stones within her right kidney, but I am also concerned that that kidney  also is probably very poorly functioning.   PLAN:  My plan is to admit her to the hospital, obtain the necessary  preoperative evaluation, and plan on doing a ureteroscopy with probable  holmium laser lithotripsy and stent placement some time in the next 24  hours.  Once we get the kidney unobstructed, we will need to get a renal  scan to determine how much function, if any, is in that right kidney.  If it  has very poor function, then I would not treat the right renal calculi and  would probably leave her kidney in situ unless she got into  trouble.  If the  kidney has reasonable function and appears to be salvageable, then she will  need treatment, presumably with lithotripsy, to try to treat the remaining  right stones.  Because we have much better equipment and personal at [facility], we will transfer the patient from the [facility] Emergency Room to [facility] for admission.  She will receive IV antibiotics, IV fluids, and pain  medication as necessary.                                               Valetta Fuller, M.D.    DSG/MEDQ  D:  05/07/2002  T:  05/07/2002  Job:  [id number]

## 2011-01-09 ENCOUNTER — Other Ambulatory Visit: Payer: Self-pay | Admitting: Internal Medicine

## 2011-01-09 DIAGNOSIS — Z1231 Encounter for screening mammogram for malignant neoplasm of breast: Secondary | ICD-10-CM

## 2011-01-27 ENCOUNTER — Other Ambulatory Visit: Payer: Self-pay | Admitting: *Deleted

## 2011-01-27 ENCOUNTER — Other Ambulatory Visit: Payer: Self-pay | Admitting: Internal Medicine

## 2011-01-27 MED ORDER — AMLODIPINE BESYLATE 10 MG PO TABS: 10.0000 mg | ORAL_TABLET | Freq: Every day | ORAL | Status: DC

## 2011-01-27 MED ORDER — ISOSORBIDE MONONITRATE ER 120 MG PO TB24: 120.0000 mg | ORAL_TABLET | Freq: Every day | ORAL | Status: DC

## 2011-01-27 NOTE — Telephone Encounter (Signed)
Appointment scheduled for 7/13 

## 2011-02-02 ENCOUNTER — Ambulatory Visit
Admission: RE | Admit: 2011-02-02 | Discharge: 2011-02-02 | Disposition: A | Discharge status: Home / Self Care | Payer: PRIVATE HEALTH INSURANCE | Source: Ambulatory Visit | Attending: *Deleted | Admitting: *Deleted

## 2011-02-02 DIAGNOSIS — Z1231 Encounter for screening mammogram for malignant neoplasm of breast: Secondary | ICD-10-CM

## 2011-02-10 ENCOUNTER — Encounter: Payer: PRIVATE HEALTH INSURANCE | Admitting: Internal Medicine

## 2011-02-13 ENCOUNTER — Encounter: Payer: PRIVATE HEALTH INSURANCE | Admitting: Internal Medicine

## 2011-02-17 ENCOUNTER — Emergency Department (HOSPITAL_COMMUNITY)
Admission: EM | Admit: 2011-02-17 | Discharge: 2011-02-17 | Disposition: A | Discharge status: Home / Self Care | Payer: PRIVATE HEALTH INSURANCE | Attending: Emergency Medicine | Admitting: Emergency Medicine

## 2011-02-17 ENCOUNTER — Emergency Department (HOSPITAL_COMMUNITY): Payer: PRIVATE HEALTH INSURANCE

## 2011-02-17 DIAGNOSIS — R339 Retention of urine, unspecified: Secondary | ICD-10-CM | POA: Insufficient documentation

## 2011-02-17 DIAGNOSIS — R109 Unspecified abdominal pain: Secondary | ICD-10-CM | POA: Insufficient documentation

## 2011-02-17 DIAGNOSIS — Z79899 Other long term (current) drug therapy: Secondary | ICD-10-CM | POA: Insufficient documentation

## 2011-02-17 DIAGNOSIS — E785 Hyperlipidemia, unspecified: Secondary | ICD-10-CM | POA: Insufficient documentation

## 2011-02-17 DIAGNOSIS — N201 Calculus of ureter: Secondary | ICD-10-CM | POA: Insufficient documentation

## 2011-02-17 DIAGNOSIS — I10 Essential (primary) hypertension: Secondary | ICD-10-CM | POA: Insufficient documentation

## 2011-02-17 LAB — URINALYSIS, ROUTINE W REFLEX MICROSCOPIC
Bilirubin Urine: NEGATIVE
Glucose, UA: NEGATIVE mg/dL
Nitrite: NEGATIVE
Specific Gravity, Urine: 1.018 (ref 1.005–1.030)
pH: 7.5 (ref 5.0–8.0)

## 2011-02-17 LAB — DIFFERENTIAL
Basophils Relative: 0 % (ref 0–1)
Eosinophils Absolute: 0.1 10*3/uL (ref 0.0–0.7)
Eosinophils Relative: 1 % (ref 0–5)
Lymphs Abs: 2.7 10*3/uL (ref 0.7–4.0)
Monocytes Absolute: 0.8 10*3/uL (ref 0.1–1.0)
Monocytes Relative: 7 % (ref 3–12)

## 2011-02-17 LAB — URINE MICROSCOPIC-ADD ON

## 2011-02-17 LAB — CBC
MCH: 31.2 pg (ref 26.0–34.0)
MCHC: 34.3 g/dL (ref 30.0–36.0)
MCV: 90.9 fL (ref 78.0–100.0)
Platelets: 240 10*3/uL (ref 150–400)

## 2011-02-17 LAB — BASIC METABOLIC PANEL
Calcium: 10.5 mg/dL (ref 8.4–10.5)
Creatinine, Ser: 0.7 mg/dL (ref 0.50–1.10)
GFR calc non Af Amer: >60 mL/min (ref >60)
Sodium: 135 mEq/L (ref 135–145)

## 2011-02-18 LAB — URINE CULTURE

## 2011-03-04 ENCOUNTER — Encounter: Payer: PRIVATE HEALTH INSURANCE | Admitting: Internal Medicine

## 2011-03-09 ENCOUNTER — Other Ambulatory Visit: Payer: Self-pay | Admitting: Family Medicine

## 2011-03-09 ENCOUNTER — Other Ambulatory Visit: Payer: Self-pay | Admitting: Internal Medicine

## 2011-03-09 DIAGNOSIS — Z1231 Encounter for screening mammogram for malignant neoplasm of breast: Secondary | ICD-10-CM

## 2011-03-12 ENCOUNTER — Encounter: Payer: Self-pay | Admitting: Internal Medicine

## 2011-03-12 ENCOUNTER — Ambulatory Visit (INDEPENDENT_AMBULATORY_CARE_PROVIDER_SITE_OTHER): Payer: PRIVATE HEALTH INSURANCE | Admitting: Internal Medicine

## 2011-03-12 DIAGNOSIS — I10 Essential (primary) hypertension: Secondary | ICD-10-CM

## 2011-03-12 DIAGNOSIS — E785 Hyperlipidemia, unspecified: Secondary | ICD-10-CM

## 2011-03-12 DIAGNOSIS — R079 Chest pain, unspecified: Secondary | ICD-10-CM

## 2011-03-12 MED ORDER — ISOSORBIDE MONONITRATE ER 120 MG PO TB24: 120.0000 mg | ORAL_TABLET | Freq: Every day | ORAL | Status: DC

## 2011-03-12 MED ORDER — FUROSEMIDE 40 MG PO TABS: 40.0000 mg | ORAL_TABLET | Freq: Two times a day (BID) | ORAL | Status: DC

## 2011-03-12 MED ORDER — AMLODIPINE BESYLATE 10 MG PO TABS: 10.0000 mg | ORAL_TABLET | Freq: Every day | ORAL | Status: DC

## 2011-03-12 MED ORDER — LOSARTAN POTASSIUM 25 MG PO TABS: 25.0000 mg | ORAL_TABLET | Freq: Every day | ORAL | Status: DC

## 2011-03-12 MED ORDER — METOPROLOL TARTRATE 50 MG PO TABS: 50.0000 mg | ORAL_TABLET | Freq: Two times a day (BID) | ORAL | Status: DC

## 2011-03-12 NOTE — Assessment & Plan Note (Signed)
LDL is 131, slightly increased from last value of 111. We will start pravastatin at low dose and increase if indicated.

## 2011-03-12 NOTE — Progress Notes (Signed)
  Subjective:    Patient ID: Tina Chase, female    DOB: Dec 21, 1940, 70 y.o.   MRN: [id number]  HPI  Patient is a 70 year old female with past medical history outlined below who presents to clinic for regular followup on blood pressure and cholesterol. She denies recent sicknesses or hospitalizations, no episodes of chest pain, no shortness of breath, no abdominal or urinary concerns. She reports compliance with medications as well as recommended diet and exercise. Overall she feels well and has energy for her daily activities.  Review of Systems Constitutional: Denies fever, chills, diaphoresis, appetite change and fatigue.  HEENT: Denies photophobia, eye pain, redness, hearing loss, ear pain, congestion, sore throat, rhinorrhea, sneezing, mouth sores, trouble swallowing, neck pain, neck stiffness and tinnitus.   Respiratory: Denies SOB, DOE, cough, chest tightness,  and wheezing.   Cardiovascular: Denies chest pain, palpitations and leg swelling.  Gastrointestinal: Denies nausea, vomiting, abdominal pain, diarrhea, constipation, blood in stool and abdominal distention.  Genitourinary: Denies dysuria, urgency, frequency, hematuria, flank pain and difficulty urinating.  Musculoskeletal: Denies myalgias, back pain, joint swelling, arthralgias and gait problem.  Skin: Denies pallor, rash and wound.  Neurological: Denies dizziness, seizures, syncope, weakness, light-headedness, numbness and headaches.  Hematological: Denies adenopathy. Easy bruising, personal or family bleeding history  Psychiatric/Behavioral: Denies suicidal ideation, mood changes, confusion, nervousness, sleep disturbance and agitation      Objective:   Physical Exam  Constitutional: Vital signs reviewed.  Patient is a well-developed and well-nourished in no acute distress and cooperative with exam. Alert and oriented x3.  Neck: Supple, Trachea midline normal ROM, No JVD, mass, thyromegaly, or carotid bruit present.    Cardiovascular: RRR, S1 normal, S2 normal, no MRG, pulses symmetric and intact bilaterally Pulmonary/Chest: CTAB, no wheezes, rales, or rhonchi Abdominal: Soft. Non-tender, non-distended, bowel sounds are normal, no masses, organomegaly, or guarding present.  Neurological: A&O x3, Strenght is normal and symmetric bilaterally, cranial nerve II-XII are grossly intact, no focal motor deficit, sensory intact to light touch bilaterally.  Skin: Warm, dry and intact. No rash, cyanosis, or clubbing.  Psychiatric: Normal mood and affect. speech and behavior is normal. Judgment and thought content normal. Cognition and memory are normal.         Assessment & Plan:

## 2011-03-12 NOTE — Assessment & Plan Note (Signed)
Blood pressure is slightly above the goal. Patient reports checking her blood pressure regularly at home and the numbers are usually less than 130/80. I have advised patient to continue checking her blood pressure and to write it down and to bring it to the clinic appointment next visit so that we can reevaluate medications and make appropriate changes.

## 2011-04-16 ENCOUNTER — Encounter: Payer: Self-pay | Admitting: Cardiology

## 2011-04-16 ENCOUNTER — Other Ambulatory Visit (HOSPITAL_COMMUNITY): Payer: Self-pay | Admitting: Urology

## 2011-04-16 DIAGNOSIS — N261 Atrophy of kidney (terminal): Secondary | ICD-10-CM

## 2011-04-23 ENCOUNTER — Encounter (HOSPITAL_COMMUNITY)
Admission: RE | Admit: 2011-04-23 | Discharge: 2011-04-23 | Disposition: A | Discharge status: Home / Self Care | Payer: PRIVATE HEALTH INSURANCE | Source: Ambulatory Visit | Attending: Urology | Admitting: Urology

## 2011-04-23 DIAGNOSIS — N269 Renal sclerosis, unspecified: Secondary | ICD-10-CM | POA: Insufficient documentation

## 2011-04-23 DIAGNOSIS — N200 Calculus of kidney: Secondary | ICD-10-CM | POA: Insufficient documentation

## 2011-04-23 DIAGNOSIS — N201 Calculus of ureter: Secondary | ICD-10-CM | POA: Insufficient documentation

## 2011-04-23 DIAGNOSIS — N261 Atrophy of kidney (terminal): Secondary | ICD-10-CM

## 2011-04-23 MED ORDER — TECHNETIUM TC 99M MERTIATIDE
14.8000 | Freq: Once | INTRAVENOUS | Status: AC | PRN
  Administered 2011-04-23: 15 via INTRAVENOUS

## 2011-04-23 MED ORDER — FUROSEMIDE 10 MG/ML IJ SOLN: 60.0000 mg | Freq: Once | INTRAMUSCULAR | Status: DC

## 2011-05-26 ENCOUNTER — Ambulatory Visit: Payer: PRIVATE HEALTH INSURANCE | Admitting: Cardiology

## 2011-06-08 ENCOUNTER — Other Ambulatory Visit: Payer: Self-pay | Admitting: Urology

## 2011-07-02 NOTE — Telephone Encounter (Signed)
Can you please close encounter 

## 2011-07-22 ENCOUNTER — Encounter: Payer: Self-pay | Admitting: Cardiology

## 2011-07-23 ENCOUNTER — Ambulatory Visit (INDEPENDENT_AMBULATORY_CARE_PROVIDER_SITE_OTHER): Payer: PRIVATE HEALTH INSURANCE | Admitting: Cardiology

## 2011-07-23 ENCOUNTER — Encounter: Payer: Self-pay | Admitting: Cardiology

## 2011-07-23 DIAGNOSIS — R0602 Shortness of breath: Secondary | ICD-10-CM

## 2011-07-23 DIAGNOSIS — R079 Chest pain, unspecified: Secondary | ICD-10-CM

## 2011-07-23 DIAGNOSIS — I10 Essential (primary) hypertension: Secondary | ICD-10-CM

## 2011-07-23 NOTE — Assessment & Plan Note (Signed)
We correlated her blood pressure cuff. I'm not going to adjust her medications as her blood pressure at home runs below 140/90. She certainly needs therapeutic lifestyle change most notably weight loss for blood pressure control per

## 2011-07-23 NOTE — Assessment & Plan Note (Signed)
She has had atypical pain and a low risk perfusion study this year. She has no new symptoms. She has a reasonable functional level. Therefore, based on ACC/AHA guidelines, the patient would be at acceptable risk for the planned procedure without further cardiovascular testing.

## 2011-07-23 NOTE — Progress Notes (Signed)
   HPI The patient presents for followup of chest pain. 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 The ejection fraction was well preserved. Overall it was a low risk study. Since that time she has had no new chest pain. She denies any palpitations, presyncope or syncope. She has had no new shortness of breath, PND or orthopnea. 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  Allergies  Allergen Reactions  . Hydralazine Anaphylaxis    Swelling tongue.  . Aspirin     REACTION: hives  . Lisinopril     REACTION: angioedema    Current Outpatient Prescriptions  Medication Sig Dispense Refill  . albuterol (VENTOLIN HFA) 108 (90 BASE) MCG/ACT inhaler Inhale 2 puffs into the lungs. Every 4-6 hours as needed for wheezing.       Marland Kitchen amLODipine (NORVASC) 10 MG tablet Take 1 tablet (10 mg total) by mouth daily.  31 tablet  11  . furosemide (LASIX) 40 MG tablet Take 1 tablet (40 mg total) by mouth 2 (two) times daily.  62 tablet  11  . isosorbide mononitrate (IMDUR) 120 MG 24 hr tablet Take 1 tablet (120 mg total) by mouth daily.  31 tablet  11  . losartan (COZAAR) 25 MG tablet Take 1 tablet (25 mg total) by mouth daily.  31 tablet  11  . metoprolol (LOPRESSOR) 50 MG tablet Take 1 tablet (50 mg total) by mouth 2 (two) times daily.  60 tablet  11  . Omega-3 Fatty Acids (OMEGA 3 PO) Take by mouth 2 (two) times daily.        . potassium chloride SA (K-DUR,KLOR-CON) 20 MEQ tablet Take 1 tablet (20 mEq total) by mouth daily.  30 tablet  11    Past Medical History  Diagnosis Date  . Hyperlipidemia   . Hypertension   . Hyperparathyroidism   . Obesity   . Hyperglycemia   . Hypercalcemia     Past Surgical History  Procedure Date  . Tubal ligation     ROS:.  As stated in the HPI and negative for all other systems.  PHYSICAL EXAM BP 172/96  Pulse 73  Resp 16  Ht 5\' 5"  (1.651 m)  Wt 322 lb 12.8 oz (146.421 kg)   BMI 53.72 kg/m2  LMP 09/23/1986 GENERAL:  Well appearing HEENT:  Pupils equal round and reactive, fundi not visualized, oral mucosa unremarkable NECK:  No jugular venous distention, waveform within normal limits, carotid upstroke brisk and symmetric, no bruits, no thyromegaly LYMPHATICS:  No cervical, inguinal adenopathy LUNGS:  Clear to auscultation bilaterally BACK:  No CVA tenderness CHEST:  Unremarkable HEART:  PMI not displaced or sustained,S1 and S2 within normal limits, no S3, no S4, no clicks, no rubs, no murmurs ABD:  Flat, positive bowel sounds normal in frequency in pitch, no bruits, no rebound, no guarding, no midline pulsatile mass, no hepatomegaly, no splenomegaly, morbidly obese EXT:  2 plus pulses throughout, no edema, no cyanosis no clubbing, nonpitting edema SKIN:  No rashes no nodules NEURO:  Cranial nerves II through XII grossly intact, motor grossly intact throughout PSYCH:  Cognitively intact, oriented to person place and time  EKG:  Sinus rhythm, rate 79, axis within normal limits, intervals within normal limits, no acute ST-T wave changes. 07/23/2011   ASSESSMENT AND PLAN

## 2011-07-23 NOTE — Patient Instructions (Signed)
Follow up as needed  The current medical regimen is effective;  continue present plan and medications.  

## 2011-07-23 NOTE — Assessment & Plan Note (Signed)
She's had no new symptoms. No change in therapy or further studies are indicated.

## 2011-07-31 ENCOUNTER — Other Ambulatory Visit: Payer: Self-pay | Admitting: Internal Medicine

## 2011-08-06 ENCOUNTER — Other Ambulatory Visit: Payer: Self-pay | Admitting: Urology

## 2011-08-06 ENCOUNTER — Encounter (HOSPITAL_COMMUNITY): Payer: Self-pay | Admitting: Pharmacy Technician

## 2011-08-11 ENCOUNTER — Encounter (HOSPITAL_COMMUNITY): Payer: Self-pay

## 2011-08-11 ENCOUNTER — Encounter (HOSPITAL_COMMUNITY)
Admission: RE | Admit: 2011-08-11 | Discharge: 2011-08-11 | Disposition: A | Discharge status: Home / Self Care | Payer: PRIVATE HEALTH INSURANCE | Source: Ambulatory Visit | Attending: Urology | Admitting: Urology

## 2011-08-11 ENCOUNTER — Ambulatory Visit (HOSPITAL_COMMUNITY)
Admission: RE | Admit: 2011-08-11 | Discharge: 2011-08-11 | Disposition: A | Discharge status: Home / Self Care | Payer: PRIVATE HEALTH INSURANCE | Source: Ambulatory Visit | Attending: Urology | Admitting: Urology

## 2011-08-11 DIAGNOSIS — Z01818 Encounter for other preprocedural examination: Secondary | ICD-10-CM | POA: Insufficient documentation

## 2011-08-11 DIAGNOSIS — Z01812 Encounter for preprocedural laboratory examination: Secondary | ICD-10-CM | POA: Insufficient documentation

## 2011-08-11 DIAGNOSIS — I10 Essential (primary) hypertension: Secondary | ICD-10-CM | POA: Insufficient documentation

## 2011-08-11 HISTORY — DX: Unspecified osteoarthritis, unspecified site: M19.90

## 2011-08-11 HISTORY — DX: Chronic kidney disease, unspecified: N18.9

## 2011-08-11 LAB — BASIC METABOLIC PANEL
Chloride: 99 mEq/L (ref 96–112)
GFR calc Af Amer: >90 mL/min (ref >90)
Potassium: 3.9 mEq/L (ref 3.5–5.1)
Sodium: 139 mEq/L (ref 135–145)

## 2011-08-11 LAB — PROTIME-INR
INR: 0.93 (ref 0.00–1.49)
Prothrombin Time: 12.7 seconds (ref 11.6–15.2)

## 2011-08-11 LAB — CBC
Platelets: 235 10*3/uL (ref 150–400)
RBC: 4.61 MIL/uL (ref 3.87–5.11)
RDW: 13.9 % (ref 11.5–15.5)
WBC: 9.3 10*3/uL (ref 4.0–10.5)

## 2011-08-11 LAB — SURGICAL PCR SCREEN: Staphylococcus aureus: INVALID — AB

## 2011-08-11 LAB — APTT: aPTT: 34 seconds (ref 24–37)

## 2011-08-11 LAB — MRSA CULTURE

## 2011-08-11 MED ORDER — CEFAZOLIN SODIUM 1-5 GM-% IV SOLN: 1.0000 g | INTRAVENOUS | Status: DC

## 2011-08-11 NOTE — Patient Instructions (Signed)
20 Tina Chase  08/11/2011   Your procedure is scheduled on:  08/19/11 [id number] am   Report to [facility] at 0630 AM.  Call this number if you have problems the morning of surgery: [phone]   Remember:   Do not eat food:After Midnight.  May have clear liquids:until Midnight .  Clear liquids include soda, tea, black coffee, apple or grape juice, broth.  Take these medicines the morning of surgery with A SIP OF WATER:    Do not wear jewelry, make-up or nail polish.  Do not wear lotions, powders, or perfumes.   Do not shave 48 hours prior to surgery.  Do not bring valuables to the hospital.  Contacts, dentures or bridgework may not be worn into surgery.  Leave suitcase in the car. After surgery it may be brought to your room.  For patients admitted to the hospital, checkout time is 11:00 AM the day of discharge.      Special Instructions: CHG Shower Use Special Wash: 1/2 bottle night before surgery and 1/2 bottle morning of surgery. Shower chin to toes with CHG.  Wash face and private parts with regular soap.     Please read over the following fact sheets that you were given: MRSA Information, coughing and deep breathing exercises and leg exercises.

## 2011-08-11 NOTE — Pre-Procedure Instructions (Signed)
07/23/11 EKG-EPIC  ECHO 10/23/10 EPIC  Stress Test 10/23/10 EPIC 07/23/11 LOV- Dr Antoine Poche- cardiologist

## 2011-08-18 NOTE — H&P (Signed)
Chief Complaint  Right nonfunctioning kidney   Active Problems  Right nephrolithiasis with right non-functioning kidney.   History of Present Illness    71 year old female. She presented to me with a Klebsiella urinary tract infection on March 24, 2011 and right flank pain. CT of the abdomen had shown 2 large right lower pole stones measuring 1.3 x 1.7 x 1.7 cm and 1.7 x 1.1 x 1.3 cm. The patient had a subsequent Lasix renogram which showed the differential function of the right side containing 8% of the function and the left side was 92% of function.  We have reviewed options including percutaneous nephrostolithotomy versus simple right laparoscopic nephrectomy. We discussed the risks, benefits, alternatives, and likelihood of achieving goals. The patient returns to clinic today to give a urine sample for culture. Today we further discussed the surgery. Her questions were answered to her stated satisfaction.  I received documentation from the patient's cardiologist, Dr. Rollene Rotunda, stating that the patient was low risk from a cardiac standpoint for surgery.    EKG 07/23/11 in his office showed no acute ST-T wave changes.  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  It was overall a low risk study.   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  She was started on ciprofloxacin for a Klebsiella UTI on 08/14/11.  This was also sensitive to ancef, which she will receive pre-op.     Past Medical History Problems  1. History of  Arthritis V13.4 2. History of  Asthma 493.90 3. History of  Hyperlipidemia 272.4 4. History of  Hypertension 401.9 5. History of  Murmurs 785.2  Surgical History Problems  1. History of  Tubal Ligation V25.2  Current Meds 1. Albuterol Sulfate NEBU; Therapy: (Recorded:17Aug2012) to 2.  Furosemide 40 MG Oral Tablet; Therapy: (Recorded:17Aug2012) to 3. Isosorbide Mononitrate ER 120 MG Oral Tablet Extended Release 24 Hour; Therapy:  (Recorded:17Aug2012) to 4. Klor-Con M20 20 MEQ Oral Tablet Extended Release; TAKE 1 TABLET EVERY DAY; Therapy:  22Mar2012 to 5. Losartan Potassium 25 MG Oral Tablet; TAKE 1 TABLET EVERY DAY; Therapy: 07Mar2012 to 6. Metoprolol Tartrate 50 MG Oral Tablet; TAKE 1 TABLET BY MOUTH 2 TIMES A DAY; Therapy:  21Feb2012 to  Allergies Medication  1. Aspirin Low Dose TABS 2. Shellfish-derived Products  Family History Problems  1. Paternal history of  Acute Myocardial Infarction V17.3 2. Paternal history of  Death In The Family Father 41yrs 3. Maternal history of  Death In The Family Mother 2yrs 4. Family history of  Family Health Status Number Of Children 1 son and 4 daughters 5. Maternal history of  Heart Disease V17.49 6. Paternal history of  Heart Disease V17.49  Social History Problems  1. Caffeine Use 3 qd 2. Former Smoker V15.82 smoked for 33yrs but maybe smoked for 1 pp month 3. Marital History - Divorced V61.03 4. Occupation: Lawyer, retired Nurse, children's  5. History of  Alcohol Use  Review of Systems Constitutional, skin, eye, otolaryngeal, hematologic/lymphatic, cardiovascular, pulmonary, endocrine, musculoskeletal, gastrointestinal, neurological and psychiatric system(s) were reviewed and pertinent findings if present are noted.  Constitutional: night sweats.  Eyes: blurred vision.  ENT: sinus problems.  Cardiovascular: leg swelling.  Respiratory: cough.  Musculoskeletal: back pain and joint pain.  Psychiatric: anxiety.     Physical Exam Constitutional: Well nourished and well developed.  ENT:. The ears and nose are normal in appearance. The oropharynx is normal.  Neck: The appearance of the neck is normal and no neck mass is present.  Pulmonary: No respiratory distress.  Cardiovascular: Heart rate and rhythm are normal . No  peripheral edema.  Abdomen: The abdomen is soft and nontender. The abdomen is no rebound. No CVA tenderness.  Lymphatics: The posterior cervical and supraclavicular nodes are not enlarged or tender.  Skin: Normal skin turgor and no visible rash.  Neuro/Psych:. Mood and affect are appropriate. No focal sensory deficits.    Assessment Right Nephrolithiasis Right non-functioning kidney  Plan Proceed to OR for right laparoscopic simple nephrectomy.

## 2011-08-18 NOTE — Pre-Procedure Instructions (Signed)
08/18/11 Nurse called pt to check on times of medications.  Pt stated she had taken Imdur, Metoprolol and Norvasc on 08/17/11 at 1300pm.  On 08/18/11 pt states she took Imdur and Metoprolol and Norvasc at 1100-1130 am.  Pt stated she would be taking meds of Metoprolol, Norvasc and Imdur at 0430am of 08/19/11 due to transportation picking her up at 0530am..  Nurse instructed pt not to take meds of Metoprolol, Norvasc and Imdur as previously instructed on preop instruction sheet or any of other medications as listed on preop sheet to take am of surgery due to timing.  Instructed pt to take Inhaler if needed and to bring Inhaler with her as previously instructed. Pt voiced understanding and stated she would not take any meds in am of 08/19/11 day of surgery.  Pt asked questions regarding suitcase and nurse instructed pt to bring good walking shoes for room and halls and to bring bathrobe to wear over gown.  Pt voiced understanding.

## 2011-08-19 ENCOUNTER — Inpatient Hospital Stay (HOSPITAL_COMMUNITY): Payer: PRIVATE HEALTH INSURANCE

## 2011-08-19 ENCOUNTER — Inpatient Hospital Stay (HOSPITAL_COMMUNITY)
Admission: RE | Admit: 2011-08-19 | Discharge: 2011-08-25 | DRG: 659 | Disposition: A | Discharge status: Home Health | Payer: PRIVATE HEALTH INSURANCE | Source: Ambulatory Visit | Attending: Urology | Admitting: Urology

## 2011-08-19 ENCOUNTER — Encounter (HOSPITAL_COMMUNITY): Payer: Self-pay | Admitting: *Deleted

## 2011-08-19 ENCOUNTER — Other Ambulatory Visit: Payer: Self-pay | Admitting: Urology

## 2011-08-19 ENCOUNTER — Encounter (HOSPITAL_COMMUNITY)
Admission: RE | Disposition: A | Discharge status: Home / Self Care | Payer: Self-pay | Source: Ambulatory Visit | Attending: Urology

## 2011-08-19 ENCOUNTER — Encounter (HOSPITAL_COMMUNITY): Payer: Self-pay | Admitting: Anesthesiology

## 2011-08-19 ENCOUNTER — Inpatient Hospital Stay (HOSPITAL_COMMUNITY): Payer: PRIVATE HEALTH INSURANCE | Admitting: Anesthesiology

## 2011-08-19 DIAGNOSIS — D72829 Elevated white blood cell count, unspecified: Secondary | ICD-10-CM | POA: Diagnosis not present

## 2011-08-19 DIAGNOSIS — N200 Calculus of kidney: Secondary | ICD-10-CM | POA: Diagnosis present

## 2011-08-19 DIAGNOSIS — N289 Disorder of kidney and ureter, unspecified: Principal | ICD-10-CM | POA: Diagnosis present

## 2011-08-19 DIAGNOSIS — I10 Essential (primary) hypertension: Secondary | ICD-10-CM | POA: Diagnosis present

## 2011-08-19 DIAGNOSIS — R011 Cardiac murmur, unspecified: Secondary | ICD-10-CM | POA: Diagnosis present

## 2011-08-19 DIAGNOSIS — E669 Obesity, unspecified: Secondary | ICD-10-CM | POA: Diagnosis present

## 2011-08-19 DIAGNOSIS — J9600 Acute respiratory failure, unspecified whether with hypoxia or hypercapnia: Secondary | ICD-10-CM

## 2011-08-19 DIAGNOSIS — J45909 Unspecified asthma, uncomplicated: Secondary | ICD-10-CM | POA: Diagnosis present

## 2011-08-19 DIAGNOSIS — J95821 Acute postprocedural respiratory failure: Secondary | ICD-10-CM | POA: Diagnosis not present

## 2011-08-19 DIAGNOSIS — E785 Hyperlipidemia, unspecified: Secondary | ICD-10-CM | POA: Diagnosis present

## 2011-08-19 DIAGNOSIS — Z6841 Body Mass Index (BMI) 40.0 and over, adult: Secondary | ICD-10-CM

## 2011-08-19 DIAGNOSIS — J984 Other disorders of lung: Secondary | ICD-10-CM

## 2011-08-19 DIAGNOSIS — Z87891 Personal history of nicotine dependence: Secondary | ICD-10-CM

## 2011-08-19 DIAGNOSIS — R7309 Other abnormal glucose: Secondary | ICD-10-CM | POA: Diagnosis not present

## 2011-08-19 HISTORY — PX: LAPAROSCOPIC NEPHRECTOMY: SHX1930

## 2011-08-19 LAB — TYPE AND SCREEN: ABO/RH(D): O POS

## 2011-08-19 LAB — ABO/RH: ABO/RH(D): O POS

## 2011-08-19 SURGERY — NEPHRECTOMY, RADICAL, LAPAROSCOPIC, ADULT
Anesthesia: General | Site: Abdomen | Laterality: Right | Wound class: Clean Contaminated

## 2011-08-19 MED ORDER — HYDROMORPHONE HCL PF 1 MG/ML IJ SOLN: 0.2500 mg | INTRAMUSCULAR | Status: DC | PRN

## 2011-08-19 MED ORDER — HEPARIN SODIUM (PORCINE) 5000 UNIT/ML IJ SOLN
5000.0000 [IU] | Freq: Once | INTRAMUSCULAR | Status: AC
  Administered 2011-08-19: 5000 [IU] via SUBCUTANEOUS

## 2011-08-19 MED ORDER — PROPOFOL 10 MG/ML IV EMUL
INTRAVENOUS | Status: DC | PRN
  Administered 2011-08-19 (×2): 50 mg via INTRAVENOUS
  Administered 2011-08-19: 200 mg via INTRAVENOUS

## 2011-08-19 MED ORDER — HEPARIN SODIUM (PORCINE) 5000 UNIT/ML IJ SOLN
5000.0000 [IU] | Freq: Three times a day (TID) | INTRAMUSCULAR | Status: DC
  Administered 2011-08-19 – 2011-08-21 (×5): 5000 [IU] via SUBCUTANEOUS
  Filled 2011-08-19 (×8): qty 1

## 2011-08-19 MED ORDER — 0.9 % SODIUM CHLORIDE (POUR BTL) OPTIME
TOPICAL | Status: DC | PRN
  Administered 2011-08-19: 1000 mL

## 2011-08-19 MED ORDER — METOPROLOL TARTRATE 1 MG/ML IV SOLN
INTRAVENOUS | Status: DC | PRN
  Administered 2011-08-19: 1 mg via INTRAVENOUS

## 2011-08-19 MED ORDER — MORPHINE SULFATE 2 MG/ML IJ SOLN
2.0000 mg | INTRAMUSCULAR | Status: DC | PRN
  Administered 2011-08-19: 4 mg via INTRAVENOUS
  Administered 2011-08-19 – 2011-08-20 (×7): 2 mg via INTRAVENOUS
  Filled 2011-08-19 (×2): qty 1
  Filled 2011-08-19: qty 2
  Filled 2011-08-19 (×4): qty 1

## 2011-08-19 MED ORDER — ROCURONIUM BROMIDE 100 MG/10ML IV SOLN
INTRAVENOUS | Status: DC | PRN
  Administered 2011-08-19: 20 mg via INTRAVENOUS
  Administered 2011-08-19: 50 mg via INTRAVENOUS
  Administered 2011-08-19 (×3): 10 mg via INTRAVENOUS

## 2011-08-19 MED ORDER — HYDROMORPHONE HCL PF 1 MG/ML IJ SOLN
INTRAMUSCULAR | Status: DC | PRN
  Administered 2011-08-19: 1 mg via INTRAVENOUS

## 2011-08-19 MED ORDER — DEXTROMETHORPHAN-GUAIFENESIN 10-100 MG/5ML PO LIQD
5.0000 mL | ORAL | Status: DC | PRN
  Filled 2011-08-19: qty 5

## 2011-08-19 MED ORDER — GLYCOPYRROLATE 0.2 MG/ML IJ SOLN
INTRAMUSCULAR | Status: DC | PRN
  Administered 2011-08-19: .8 mg via INTRAVENOUS

## 2011-08-19 MED ORDER — PROMETHAZINE HCL 25 MG/ML IJ SOLN: 6.2500 mg | INTRAMUSCULAR | Status: DC | PRN

## 2011-08-19 MED ORDER — METOPROLOL TARTRATE 50 MG PO TABS
50.0000 mg | ORAL_TABLET | Freq: Two times a day (BID) | ORAL | Status: DC
  Administered 2011-08-20 – 2011-08-25 (×11): 50 mg via ORAL
  Filled 2011-08-19 (×15): qty 1

## 2011-08-19 MED ORDER — MORPHINE SULFATE 4 MG/ML IJ SOLN
INTRAMUSCULAR | Status: AC
  Filled 2011-08-19: qty 1

## 2011-08-19 MED ORDER — BUPIVACAINE LIPOSOME 1.3 % IJ SUSP
INTRAMUSCULAR | Status: DC | PRN
  Administered 2011-08-19: 20 mL

## 2011-08-19 MED ORDER — FENTANYL CITRATE 0.05 MG/ML IJ SOLN
25.0000 ug | Freq: Once | INTRAMUSCULAR | Status: AC
  Administered 2011-08-19 (×2): 25 ug via INTRAVENOUS

## 2011-08-19 MED ORDER — CEFAZOLIN SODIUM-DEXTROSE 2-3 GM-% IV SOLR
2.0000 g | Freq: Once | INTRAVENOUS | Status: AC
  Administered 2011-08-19: 2 g via INTRAVENOUS
  Filled 2011-08-19: qty 50

## 2011-08-19 MED ORDER — CEFAZOLIN SODIUM-DEXTROSE 2-3 GM-% IV SOLR
2.0000 g | Freq: Three times a day (TID) | INTRAVENOUS | Status: DC
  Administered 2011-08-19 – 2011-08-24 (×14): 2 g via INTRAVENOUS
  Filled 2011-08-19 (×17): qty 50

## 2011-08-19 MED ORDER — SUCCINYLCHOLINE CHLORIDE 20 MG/ML IJ SOLN
INTRAMUSCULAR | Status: DC | PRN
  Administered 2011-08-19: 200 mg via INTRAVENOUS

## 2011-08-19 MED ORDER — ALBUTEROL SULFATE HFA 108 (90 BASE) MCG/ACT IN AERS
1.0000 | INHALATION_SPRAY | RESPIRATORY_TRACT | Status: DC
  Administered 2011-08-19 – 2011-08-20 (×3): 1 via RESPIRATORY_TRACT
  Filled 2011-08-19: qty 6.7

## 2011-08-19 MED ORDER — LACTATED RINGERS IV SOLN
INTRAVENOUS | Status: DC | PRN
  Administered 2011-08-19 (×2): via INTRAVENOUS

## 2011-08-19 MED ORDER — PANTOPRAZOLE SODIUM 40 MG IV SOLR
40.0000 mg | INTRAVENOUS | Status: DC
  Administered 2011-08-19: 40 mg via INTRAVENOUS
  Filled 2011-08-19 (×2): qty 40

## 2011-08-19 MED ORDER — MIDAZOLAM HCL 5 MG/5ML IJ SOLN
INTRAMUSCULAR | Status: DC | PRN
  Administered 2011-08-19 (×2): 1 mg via INTRAVENOUS

## 2011-08-19 MED ORDER — LIDOCAINE HCL (CARDIAC) 20 MG/ML IV SOLN
INTRAVENOUS | Status: DC | PRN
  Administered 2011-08-19: 100 mg via INTRAVENOUS

## 2011-08-19 MED ORDER — BUPIVACAINE LIPOSOME 1.3 % IJ SUSP
20.0000 mL | Freq: Once | INTRAMUSCULAR | Status: DC
  Filled 2011-08-19: qty 20

## 2011-08-19 MED ORDER — ONDANSETRON HCL 4 MG/2ML IJ SOLN
INTRAMUSCULAR | Status: DC | PRN
  Administered 2011-08-19: 4 mg via INTRAVENOUS

## 2011-08-19 MED ORDER — OXYCODONE HCL 5 MG PO TABS: 5.0000 mg | ORAL_TABLET | ORAL | Status: DC | PRN

## 2011-08-19 MED ORDER — FUROSEMIDE 10 MG/ML IJ SOLN
40.0000 mg | Freq: Once | INTRAMUSCULAR | Status: AC
  Administered 2011-08-19: 40 mg via INTRAVENOUS
  Filled 2011-08-19: qty 4

## 2011-08-19 MED ORDER — LACTATED RINGERS IV SOLN
INTRAVENOUS | Status: DC | PRN
  Administered 2011-08-19: 08:00:00 via INTRAVENOUS

## 2011-08-19 MED ORDER — NEOSTIGMINE METHYLSULFATE 1 MG/ML IJ SOLN
INTRAMUSCULAR | Status: DC | PRN
  Administered 2011-08-19: 5 mg via INTRAVENOUS

## 2011-08-19 MED ORDER — KCL IN DEXTROSE-NACL 10-5-0.45 MEQ/L-%-% IV SOLN
INTRAVENOUS | Status: DC
  Administered 2011-08-19: 22:00:00 via INTRAVENOUS
  Filled 2011-08-19 (×3): qty 1000

## 2011-08-19 MED ORDER — LACTATED RINGERS IR SOLN
Status: DC | PRN
  Administered 2011-08-19: 1000 mL

## 2011-08-19 MED ORDER — ACETAMINOPHEN 10 MG/ML IV SOLN
1000.0000 mg | Freq: Four times a day (QID) | INTRAVENOUS | Status: AC
  Administered 2011-08-19 – 2011-08-20 (×4): 1000 mg via INTRAVENOUS
  Filled 2011-08-19 (×4): qty 100

## 2011-08-19 MED ORDER — ONDANSETRON HCL 4 MG/2ML IJ SOLN
4.0000 mg | Freq: Four times a day (QID) | INTRAMUSCULAR | Status: DC | PRN
  Administered 2011-08-19 – 2011-08-25 (×5): 4 mg via INTRAVENOUS
  Filled 2011-08-19 (×5): qty 2

## 2011-08-19 MED ORDER — AMLODIPINE BESYLATE 10 MG PO TABS
10.0000 mg | ORAL_TABLET | Freq: Every day | ORAL | Status: DC
  Administered 2011-08-20 – 2011-08-25 (×6): 10 mg via ORAL
  Filled 2011-08-19 (×7): qty 1

## 2011-08-19 MED ORDER — ISOSORBIDE MONONITRATE ER 60 MG PO TB24
120.0000 mg | ORAL_TABLET | Freq: Every day | ORAL | Status: DC
  Administered 2011-08-20 – 2011-08-25 (×6): 120 mg via ORAL
  Filled 2011-08-19 (×8): qty 2

## 2011-08-19 MED ORDER — FENTANYL CITRATE 0.05 MG/ML IJ SOLN
INTRAMUSCULAR | Status: DC | PRN
  Administered 2011-08-19: 50 ug via INTRAVENOUS
  Administered 2011-08-19: 100 ug via INTRAVENOUS
  Administered 2011-08-19 (×2): 50 ug via INTRAVENOUS

## 2011-08-19 MED ORDER — MIDAZOLAM HCL 5 MG/ML IJ SOLN
0.5000 mg | Freq: Once | INTRAMUSCULAR | Status: AC | PRN
  Administered 2011-08-19 (×2): 1 mg via INTRAVENOUS
  Administered 2011-08-19: 2 mg via INTRAVENOUS

## 2011-08-19 SURGICAL SUPPLY — 81 items
ADH SKN CLS APL DERMABOND .7 (GAUZE/BANDAGES/DRESSINGS)
APL SKNCLS STERI-STRIP NONHPOA (GAUZE/BANDAGES/DRESSINGS)
BAG SPEC THK2 15X12 ZIP CLS (MISCELLANEOUS) ×1
BAG ZIPLOCK 12X15 (MISCELLANEOUS) ×2 IMPLANT
BENZOIN TINCTURE PRP APPL 2/3 (GAUZE/BANDAGES/DRESSINGS) IMPLANT
BLADE EXTENDED COATED 6.5IN (ELECTRODE) IMPLANT
BLADE SURG SZ10 CARB STEEL (BLADE) ×2 IMPLANT
CANISTER SUCTION 2500CC (MISCELLANEOUS) ×2 IMPLANT
CHLORAPREP W/TINT 26ML (MISCELLANEOUS) ×2 IMPLANT
CLIP LIGATING HEM O LOK PURPLE (MISCELLANEOUS) ×2 IMPLANT
CLIP LIGATING HEMO LOK XL GOLD (MISCELLANEOUS) IMPLANT
CLIP LIGATING HEMO O LOK GREEN (MISCELLANEOUS) ×2 IMPLANT
CLOTH BEACON ORANGE TIMEOUT ST (SAFETY) ×2 IMPLANT
CORD HIGH FREQUENCY UNIPOLAR (ELECTROSURGICAL) ×2 IMPLANT
COVER SURGICAL LIGHT HANDLE (MISCELLANEOUS) ×4 IMPLANT
CUTTER FLEX LINEAR 45M (STAPLE) ×2 IMPLANT
DERMABOND ADVANCED (GAUZE/BANDAGES/DRESSINGS)
DERMABOND ADVANCED .7 DNX12 (GAUZE/BANDAGES/DRESSINGS) IMPLANT
DISSECTOR BLUNT TIP ENDO 5MM (MISCELLANEOUS) ×2 IMPLANT
DRAIN CHANNEL 10F 3/8 F FF (DRAIN) IMPLANT
DRAIN CHANNEL 10M FLAT 3/4 FLT (DRAIN) ×2 IMPLANT
DRAIN CHANNEL 15F RND FF 3/16 (WOUND CARE) ×2 IMPLANT
DRAPE CAMERA CLOSED 9X96 (DRAPES) ×2 IMPLANT
DRAPE INCISE IOBAN 66X45 STRL (DRAPES) ×4 IMPLANT
DRAPE LAPAROSCOPIC ABDOMINAL (DRAPES) ×2 IMPLANT
DRAPE WARM FLUID 44X44 (DRAPE) ×2 IMPLANT
DRSG TEGADERM 4X4.75 (GAUZE/BANDAGES/DRESSINGS) ×2 IMPLANT
DRSG TEGADERM 6X8 (GAUZE/BANDAGES/DRESSINGS) ×2 IMPLANT
ELECT REM PT RETURN 9FT ADLT (ELECTROSURGICAL) ×2
ELECTRODE REM PT RTRN 9FT ADLT (ELECTROSURGICAL) ×1 IMPLANT
EVACUATOR SILICONE 100CC (DRAIN) ×2 IMPLANT
FLOSEAL (HEMOSTASIS) IMPLANT
FLOSEAL 10ML (HEMOSTASIS) ×2 IMPLANT
GLOVE BIOGEL PI IND STRL 7.5 (GLOVE) ×1 IMPLANT
GLOVE BIOGEL PI INDICATOR 7.5 (GLOVE) ×1
GLOVE ECLIPSE 7.5 STRL STRAW (GLOVE) ×2 IMPLANT
GOWN PREVENTION PLUS XLARGE (GOWN DISPOSABLE) ×6 IMPLANT
GOWN STRL NON-REIN LRG LVL3 (GOWN DISPOSABLE) ×2 IMPLANT
HEMOSTAT SURGICEL 4X8 (HEMOSTASIS) IMPLANT
KIT BASIN OR (CUSTOM PROCEDURE TRAY) ×2 IMPLANT
NEEDLE INSUFFLATION 14GA 150MM (NEEDLE) ×2 IMPLANT
PENCIL BUTTON HOLSTER BLD 10FT (ELECTRODE) ×2 IMPLANT
POSITIONER SURGICAL ARM (MISCELLANEOUS) ×4 IMPLANT
POUCH ENDO CATCH II 15MM (MISCELLANEOUS) ×2 IMPLANT
RELOAD 45 VASCULAR/THIN (ENDOMECHANICALS) ×4 IMPLANT
RELOAD WHITE ECR60W (STAPLE) ×2 IMPLANT
RETRACTOR LAPSCP 12X46 CVD (ENDOMECHANICALS) IMPLANT
RTRCTR LAPSCP 12X46 CVD (ENDOMECHANICALS)
SCALPEL HARMONIC ACE (MISCELLANEOUS) ×2 IMPLANT
SCISSORS LAP 5X35 DISP (ENDOMECHANICALS) IMPLANT
SET IRRIG TUBING LAPAROSCOPIC (IRRIGATION / IRRIGATOR) ×2 IMPLANT
SOLUTION ANTI FOG 6CC (MISCELLANEOUS) ×2 IMPLANT
SPONGE GAUZE 4X4 12PLY (GAUZE/BANDAGES/DRESSINGS) ×2 IMPLANT
SPONGE LAP 18X18 X RAY DECT (DISPOSABLE) ×2 IMPLANT
SPONGE LAP 4X18 X RAY DECT (DISPOSABLE) IMPLANT
SPONGE SURGIFOAM ABS GEL 100 (HEMOSTASIS) IMPLANT
STAPLE ECHEON FLEX 60 POW ENDO (STAPLE) ×2 IMPLANT
STAPLER VISISTAT 35W (STAPLE) ×2 IMPLANT
STRIP CLOSURE SKIN 1/4X4 (GAUZE/BANDAGES/DRESSINGS) IMPLANT
SUT ETHILON 3 0 PS 1 (SUTURE) ×2 IMPLANT
SUT MNCRL AB 4-0 PS2 18 (SUTURE) IMPLANT
SUT PDS AB 1 CTX 36 (SUTURE) ×4 IMPLANT
SUT VIC AB 0 CT1 27 (SUTURE) ×2
SUT VIC AB 0 CT1 27XBRD ANTBC (SUTURE) ×1 IMPLANT
SUT VIC AB 2-0 SH 27 (SUTURE)
SUT VIC AB 2-0 SH 27X BRD (SUTURE) IMPLANT
SUT VICRYL 0 UR6 27IN ABS (SUTURE) ×2 IMPLANT
SYR BULB IRRIGATION 50ML (SYRINGE) ×2 IMPLANT
SYSTEM CONVERTIBLE TROCAR (TROCAR) ×2 IMPLANT
TAPE STRIPS DRAPE STRL (GAUZE/BANDAGES/DRESSINGS) ×2 IMPLANT
TOWEL OR 17X26 10 PK STRL BLUE (TOWEL DISPOSABLE) ×2 IMPLANT
TRAY FOLEY CATH 14FRSI W/METER (CATHETERS) ×2 IMPLANT
TRAY LAP CHOLE (CUSTOM PROCEDURE TRAY) ×2 IMPLANT
TROCAR 12M 150ML BLUNT (TROCAR) ×4 IMPLANT
TROCAR BLADELESS OPT 5 100 (ENDOMECHANICALS) ×2 IMPLANT
TROCAR BLADELESS OPT 5 150 (ENDOMECHANICALS) ×2 IMPLANT
TROCAR BLADELESS OPT 5 75 (ENDOMECHANICALS) IMPLANT
TROCAR XCEL 12X100 BLDLESS (ENDOMECHANICALS) IMPLANT
TROCAR XCEL BLUNT TIP 100MML (ENDOMECHANICALS) ×2 IMPLANT
TUBING INSUFFLATION 10FT LAP (TUBING) ×2 IMPLANT
YANKAUER SUCT BULB TIP 10FT TU (MISCELLANEOUS) ×2 IMPLANT

## 2011-08-19 NOTE — Procedures (Signed)
Extubation Procedure Note  Patient Details:   Name: Tina Chase DOB: 1940/08/28 MRN: [id number]   Airway Documentation:   BBS diminished, chest expansion symetrical, No stridor noted.  RT to monitor and assess as needed  Evaluation  O2 sats: stable throughout Complications: No apparent complications Patient did tolerate procedure well. Bilateral Breath Sounds: Diminished   Yes  Berton Bon 08/19/2011, 8:34 PM

## 2011-08-19 NOTE — Consult Note (Signed)
Name: Tina Chase MRN: [id number] DOB: 12/03/1940    LOS: 0  PCCM CONSULTAION NOTE  History of Present Illness: 71 yo with R nephrolithiasis and R nonfunctional kidney s/p R nephrectomy.  Lines / Drains: 1/16  ETT 1/16  Foley 1/16  R JP  Cultures: None  Antibiotics: 1/16  Cefazolin   Tests / Events: 1/16  R nephrectomy  The patient is sedated, intubated and unable to provide history, which was obtained for available medical records.    Past Medical History  Diagnosis Date  . Hyperlipidemia   . Hypertension   . Hyperparathyroidism   . Obesity   . Hyperglycemia   . Hypercalcemia   . Asthma   . Shortness of breath     wtih exertion   . Chronic kidney disease     right non functioning kidney   . Arthritis     left knee  and left shoulder    Past Surgical History  Procedure Date  . Tubal ligation   . Other surgical history     D and C x 3    Prior to Admission medications   Medication Sig Start Date End Date Taking? Authorizing Provider  amLODipine (NORVASC) 10 MG tablet Take 10 mg by mouth daily before lunch.  03/12/11  Yes Iskra Magick-Myers, MD  furosemide (LASIX) 40 MG tablet Take 1 tablet (40 mg total) by mouth 2 (two) times daily. 03/12/11  Yes Iskra Magick-Myers, MD  isosorbide mononitrate (IMDUR) 120 MG 24 hr tablet Take 1 tablet (120 mg total) by mouth daily. 03/12/11  Yes Iskra Magick-Myers, MD  losartan (COZAAR) 25 MG tablet Take 25 mg by mouth daily before lunch.  03/12/11  Yes Iskra Magick-Myers, MD  metoprolol (LOPRESSOR) 50 MG tablet Take 1 tablet (50 mg total) by mouth 2 (two) times daily. 03/12/11 03/11/12 Yes Iskra Magick-Myers, MD  PROAIR HFA 108 (90 BASE) MCG/ACT inhaler USE 2 PUFFS EVERY 4 TO 6 HOURS AS NEEDEDFOR WHEEZING 07/31/11  Yes Anderson Malta  dextromethorphan-guaiFENesin (ROBITUSSIN-DM) 10-100 MG/5ML liquid Take 5 mLs by mouth every 4 (four) hours as needed. For cough    Historical Provider, MD  potassium chloride SA (K-DUR,KLOR-CON) 20 MEQ  tablet Take 1 tablet (20 mEq total) by mouth daily. 10/23/10 10/23/11  Rollene Rotunda, MD   Allergies Allergies  Allergen Reactions  . Hydralazine Anaphylaxis    Swelling tongue.  . Aspirin     REACTION: hives  . Crab (Shellfish Allergy) Hives  . Lisinopril     REACTION: angioedema   Family History Family History  Problem Relation Age of Onset  . Stroke Neg Hx   . Cancer Neg Hx   . Hyperlipidemia Mother   . Hypertension Mother   . Heart disease Father     had a heart attack at age 49  . Heart disease Brother     at age 16  . Heart attack Father 78  . Heart attack Brother 50  . Nephrolithiasis Neg Hx     Social History  reports that she quit smoking about 43 years ago. Her smoking use included Cigarettes. She has never used smokeless tobacco. She reports that she does not drink alcohol or use illicit drugs.  Review Of Systems  11 points review of systems is negative with an exception of listed in HPI.  Vital Signs: Temp:  [97.1 F (36.2 C)-98.5 F (36.9 C)] 97.5 F (36.4 C) (01/16 1730) Pulse Rate:  [88-125] 102  (01/16 1900) Resp:  [18-30] 30  (  01/16 1900) BP: (144-182)/(75-112) 166/94 mmHg (01/16 1850) SpO2:  [85 %-97 %] 97 % (01/16 1900) FiO2 (%):  [0.6 %-100 %] 50 % (01/16 1926) I/O last 3 completed shifts: In: 2700 [I.V.:2700] Out: 380 [Urine:240; Drains:90; Blood:50]  Physical Examination: General:  No acute distress, obese, mechanically ventilated Neuro:  Awake, alert, nonverbal, nonfocal, follows commands, moves all extremities, gag /cough present HEENT:  PERRL Neck:  No JVD   Cardiovascular:  RRR, no murmurs Lungs:  Bilateral very diminished breath sounds, no wheezing Abdomen:  Obese, mildly tender, diminished breath sounds, sanguinous exudate form R JP drain Musculoskeletal:  No edema Skin:  No edema  Ventilator settings: Vent Mode:  [-] PSV FiO2 (%):  [0.6 %-100 %] 50 % PEEP:  [5 cmH20] 5 cmH20 Pressure Support:  [15 cmH20-20 cmH20] 15  cmH20  Labs and Imaging:  Reviewed.  Please refer to the Assessment and Plan section for relevant results.  Assessment and Plan:  Nephrolithiasis with R nonfunctional kidney, s/p R nephrectomy -->  Per urology  Post-op acute respiratory failure, history of asthma with no signs of acute bronchospasm -->  Hold sedation for now -->  SBT to evaluate for possible extubation -->  goal pH > 7.30, goal SpO2 > 92 -->  f/u ABG -->  f/u CXR -->  bronchodilators  Hypertension, CAD -->  Restart Amlodipine, Lasix, Imdur, Cozaar, Metoprolol when able to take PO  Best practices / Disposition -->  ICU status under Urology -->  PCCM consulting -->  Full code -->  SCDs for DVT Px -->  Protonix for GI Px -->  ventilator bundle -->  NPO -->  family updated at bedside  The patient is critically ill with multiple organ systems failure and requires high complexity decision making for assessment and support, frequent evaluation and titration of therapies, application of advanced monitoring technologies and extensive interpretation of multiple databases. Critical Care Time devoted to patient care services described in this note is 35 minutes.  Orlean Bradford, M.D. Pulmonary and Critical Care Medicine [facility] Cell: [phone] Pager: [phone]  08/19/2011, 7:57 PM

## 2011-08-19 NOTE — Anesthesia Postprocedure Evaluation (Signed)
  Anesthesia Post-op Note  Patient: Tina Chase  Procedure(s) Performed:  LAPAROSCOPIC NEPHRECTOMY  Patient Location: PACU  Anesthesia Type: General  Level of Consciousness: awake and alert   Airway and Oxygen Therapy: Patient Spontanous Breathing  Post-op Pain: mild  Post-op Assessment: Post-op Vital signs reviewed, Patient's Cardiovascular Status Stable, Respiratory Functiion: patient to ICU ventilated. Unable to maintain sats secondary to M.O. Patent Airway and No signs of Nausea or vomiting  Post-op Vital Signs: stable  Complications: No apparent anesthesia complications

## 2011-08-19 NOTE — Brief Op Note (Signed)
08/19/2011  2:18 PM  PATIENT:  Tina Chase  71 y.o. female  PRE-OPERATIVE DIAGNOSIS:  right non functioning kidney  POST-OPERATIVE DIAGNOSIS:  right nonfunctioning kidney  PROCEDURE:  Procedure(s): LAPAROSCOPIC RIGHT NEPHRECTOMY  SURGEON:  Surgeon(s): Milford Cage, MD  PHYSICIAN ASSISTANT:   ASSISTANTS: Pecola Leisure, PA   ANESTHESIA:   general  EBL:  Total I/O In: 2000 [I.V.:2000] Out: 170 [Urine:120; Blood:50]  BLOOD ADMINISTERED:none  DRAINS: (1) Jackson-Pratt drain(s) with closed bulb suction in the RLQ and Urinary Catheter (Foley)   LOCAL MEDICATIONS USED:  OTHER Exparel  SPECIMEN:  Source of Specimen:  right kidney  DISPOSITION OF SPECIMEN:  PATHOLOGY  COUNTS:  YES  TOURNIQUET:  * No tourniquets in log *  DICTATION: .Other Dictation: Dictation Number [id number]  PLAN OF CARE: Admit to inpatient   PATIENT DISPOSITION:  PACU - hemodynamically stable.   Delay start of Pharmacological VTE agent (>24hrs) due to surgical blood loss or risk of bleeding:  No

## 2011-08-19 NOTE — Preoperative (Signed)
Beta Blockers   Reason not to administer Beta Blockers:Not Applicable, Beat blocker will be due intraop, will give lopressor iv intraop.

## 2011-08-19 NOTE — OR Nursing (Signed)
Pt transferred to ICU, room 1233 via stretcher, side rails x 2 up w/ PACU RN and RRT staff, pt remains on cardiac monitor w/ cont POX monitoring and intermittent NBP during transfer from PACU to ICU. Pt tolerated well

## 2011-08-19 NOTE — Transfer of Care (Signed)
Immediate Anesthesia Transfer of Care Note  Patient: Tina Chase  Procedure(s) Performed:  LAPAROSCOPIC NEPHRECTOMY  Patient Location: PACU  Anesthesia Type: General  Level of Consciousness: sedated, patient cooperative and responds to stimulation  Airway & Oxygen Therapy: Patient Spontanous Breathing and Patient connected to T-piece oxygen  Post-op Assessment: Report given to PACU RN and Post -op Vital signs reviewed and unstable, Anesthesiologist notified  Post vital signs: Reviewed and unstable Filed Vitals:   08/19/11 0707  BP:   Pulse: 88  Temp:   Resp:     Complications: No apparent anesthesia complications and Patient placed on t-bar secondary to slow awakening, Dr Okey Dupre at bedside and to place patient on pressure support.

## 2011-08-19 NOTE — Progress Notes (Signed)
Patient unable to maintain oxygen saturations via T-bar. PSV initiated, if unable to extubate in PACU, CCM will be contacted and they will direct ventilator management in the unit.

## 2011-08-19 NOTE — Progress Notes (Signed)
GU Post-op check  Patient with difficulty with extubation.  It has been recommended she remained intubated following surgery.  She is currently in the PACU awaiting an ICU bed.  Vitals: Pulse: 85, BP158/75 General: sedated Abdomen: No guarding, soft, JP serosanguinous fluid CV: RRR GU: Foley draining clear yellow urine.   A/P: Right laparoscopic nephrectomy today.  Difficulty extubating. -Critical care medicine has been contacted to manage the vent. -Post-op Hgb is 14.6 -Will continue post-op orders as planned except will keep intubated. -Labs ordered for morning. -Family informed of plan.

## 2011-08-19 NOTE — Anesthesia Preprocedure Evaluation (Addendum)
Anesthesia Evaluation  Patient identified by MRN, date of birth, ID band Patient awake    Reviewed: Allergy & Precautions, H&P , NPO status , Patient's Chart, lab work & pertinent test results  Airway Mallampati: III TM Distance: <3 FB Neck ROM: Full    Dental No notable dental hx.    Pulmonary neg pulmonary ROS, asthma ,  clear to auscultation  Pulmonary exam normal       Cardiovascular hypertension, Pt. on medications neg cardio ROS Regular Normal    Neuro/Psych Negative Neurological ROS  Negative Psych ROS   GI/Hepatic negative GI ROS, Neg liver ROS,   Endo/Other  Negative Endocrine ROSMorbid obesity  Renal/GU negative Renal ROS  Genitourinary negative   Musculoskeletal negative musculoskeletal ROS (+)   Abdominal   Peds negative pediatric ROS (+)  Hematology negative hematology ROS (+)   Anesthesia Other Findings   Reproductive/Obstetrics negative OB ROS                          Anesthesia Physical Anesthesia Plan  ASA: III  Anesthesia Plan:    Post-op Pain Management:    Induction: Intravenous  Airway Management Planned: Oral ETT  Additional Equipment:   Intra-op Plan:   Post-operative Plan: Extubation in OR and Possible Post-op intubation/ventilation  Informed Consent: I have reviewed the patients History and Physical, chart, labs and discussed the procedure including the risks, benefits and alternatives for the proposed anesthesia with the patient or authorized representative who has indicated his/her understanding and acceptance.   Dental advisory given  Plan Discussed with: CRNA  Anesthesia Plan Comments:         Anesthesia Quick Evaluation

## 2011-08-20 DIAGNOSIS — I10 Essential (primary) hypertension: Secondary | ICD-10-CM

## 2011-08-20 DIAGNOSIS — J9600 Acute respiratory failure, unspecified whether with hypoxia or hypercapnia: Secondary | ICD-10-CM

## 2011-08-20 DIAGNOSIS — N200 Calculus of kidney: Secondary | ICD-10-CM

## 2011-08-20 LAB — COMPREHENSIVE METABOLIC PANEL
AST: 32 U/L (ref 0–37)
Albumin: 3.1 g/dL — ABNORMAL LOW (ref 3.5–5.2)
BUN: 6 mg/dL (ref 6–23)
CO2: 26 mEq/L (ref 19–32)
Calcium: 9.6 mg/dL (ref 8.4–10.5)
Chloride: 100 mEq/L (ref 96–112)
Chloride: 101 mEq/L (ref 96–112)
Creatinine, Ser: 0.74 mg/dL (ref 0.50–1.10)
GFR calc Af Amer: >90 mL/min (ref >90)
GFR calc non Af Amer: 86 mL/min — ABNORMAL LOW (ref >90)
GFR calc non Af Amer: 85 mL/min — ABNORMAL LOW (ref >90)
Glucose, Bld: 122 mg/dL — ABNORMAL HIGH (ref 70–99)
Sodium: 134 mEq/L — ABNORMAL LOW (ref 135–145)
Total Bilirubin: 0.5 mg/dL (ref 0.3–1.2)

## 2011-08-20 LAB — CBC
Hemoglobin: 14.2 g/dL (ref 12.0–15.0)
Platelets: 213 10*3/uL (ref 150–400)
RBC: 4.63 MIL/uL (ref 3.87–5.11)
WBC: 23.7 10*3/uL — ABNORMAL HIGH (ref 4.0–10.5)

## 2011-08-20 MED ORDER — LOSARTAN POTASSIUM 25 MG PO TABS
25.0000 mg | ORAL_TABLET | Freq: Every day | ORAL | Status: DC
  Administered 2011-08-21 – 2011-08-25 (×5): 25 mg via ORAL
  Filled 2011-08-20 (×6): qty 1

## 2011-08-20 MED ORDER — PANTOPRAZOLE SODIUM 40 MG PO TBEC
40.0000 mg | DELAYED_RELEASE_TABLET | Freq: Every day | ORAL | Status: DC
  Administered 2011-08-20 – 2011-08-25 (×6): 40 mg via ORAL
  Filled 2011-08-20 (×6): qty 1

## 2011-08-20 MED ORDER — ALBUTEROL SULFATE (5 MG/ML) 0.5% IN NEBU: 2.5000 mg | INHALATION_SOLUTION | RESPIRATORY_TRACT | Status: DC | PRN

## 2011-08-20 MED ORDER — FUROSEMIDE 40 MG PO TABS
40.0000 mg | ORAL_TABLET | Freq: Two times a day (BID) | ORAL | Status: DC
  Administered 2011-08-20 – 2011-08-25 (×10): 40 mg via ORAL
  Filled 2011-08-20 (×13): qty 1

## 2011-08-20 MED ORDER — BISACODYL 10 MG RE SUPP
10.0000 mg | Freq: Two times a day (BID) | RECTAL | Status: DC
  Administered 2011-08-20 – 2011-08-25 (×7): 10 mg via RECTAL
  Filled 2011-08-20 (×8): qty 1

## 2011-08-20 MED ORDER — SENNOSIDES-DOCUSATE SODIUM 8.6-50 MG PO TABS
1.0000 | ORAL_TABLET | Freq: Two times a day (BID) | ORAL | Status: DC
  Administered 2011-08-20 – 2011-08-25 (×11): 1 via ORAL
  Filled 2011-08-20 (×14): qty 1

## 2011-08-20 MED ORDER — POLYETHYLENE GLYCOL 3350 17 G PO PACK
17.0000 g | PACK | Freq: Every day | ORAL | Status: DC
  Administered 2011-08-20 – 2011-08-24 (×5): 17 g via ORAL
  Filled 2011-08-20 (×7): qty 1

## 2011-08-20 MED ORDER — SODIUM CHLORIDE 0.9 % IV SOLN
INTRAVENOUS | Status: DC
  Administered 2011-08-20: 09:00:00 via INTRAVENOUS
  Administered 2011-08-21: 1000 mL via INTRAVENOUS
  Administered 2011-08-21 – 2011-08-24 (×6): via INTRAVENOUS

## 2011-08-20 MED ORDER — INFLUENZA VIRUS VACC SPLIT PF IM SUSP
0.5000 mL | INTRAMUSCULAR | Status: AC
  Administered 2011-08-21: 0.5 mL via INTRAMUSCULAR
  Filled 2011-08-20 (×2): qty 0.5

## 2011-08-20 MED ORDER — BISACODYL 10 MG RE SUPP
10.0000 mg | RECTAL | Status: AC
  Administered 2011-08-20: 10 mg via RECTAL
  Filled 2011-08-20: qty 1

## 2011-08-20 NOTE — Progress Notes (Signed)
Urology Progress Note  Subjective:     Patient was able to be extubated overnight.  Complains of discomfort with deep breaths.  No ambulation, flatus, or BM. Pain controlled. Labs this morning were in error; redrawn and look good.   Objective:  Patient Vitals for the past 24 hrs:  BP Temp Temp src Pulse Resp SpO2 Height Weight  08/20/11 0600 126/61 mmHg - - 88  21  95 % - -  08/20/11 0434 - 97.5 F (36.4 C) Oral - - 92 % - -  08/20/11 0400 109/54 mmHg - - 87  20  91 % - -  08/20/11 0300 116/68 mmHg - - 80  21  94 % - -  08/20/11 0200 145/72 mmHg - - 94  23  93 % - -  08/20/11 0100 131/64 mmHg - - 83  20  93 % - 151.9 kg (334 lb 14.1 oz)  08/20/11 0000 132/72 mmHg 97.2 F (36.2 C) Oral 82  20  93 % - -  08/19/11 2327 - - - - - 93 % - -  08/19/11 2200 131/58 mmHg - - 85  21  93 % - -  08/19/11 2100 139/64 mmHg - - 95  21  93 % - -  08/19/11 2045 - - - - - - 5\' 1"  (1.549 m) 152.9 kg (337 lb 1.3 oz)  08/19/11 2042 - - - - - 94 % - -  08/19/11 2030 153/75 mmHg 97.8 F (36.6 C) Oral 115  26  95 % - -  08/19/11 2000 153/75 mmHg - - 106  25  96 % - -  08/19/11 1955 162/80 mmHg - - 111  26  95 % - -  08/19/11 1900 - - - 102  30  97 % - -  08/19/11 1850 166/94 mmHg - - 100  24  97 % - -  08/19/11 1840 - - - 100  26  95 % - -  08/19/11 1830 164/88 mmHg - - 106  30  93 % - -  08/19/11 1730 - 97.5 F (36.4 C) - - - - - -  08/19/11 1615 - - - 104  - - - -  08/19/11 1600 144/75 mmHg - - - - - - -  08/19/11 1455 182/112 mmHg 97.1 F (36.2 C) - 125  18  85 % - -    Physical Exam: General:  No acute distress, awake Cardiovascular:    [x]   S1/S2 present, RRR  []   Irregularly irregular Chest:  Equal effort bilaterally, slightly course breath sounds bilaterally, no crackles Abdomen:               []  Soft, appropriately TTP  []  Soft, NTTP  [x]  Soft, appropriately TTP, incision(s) dressed, JP drain with serosanguinous drainage  Genitourinary: Foley catheter in place. Foley:  Draining  clear yellow urine.    I/O:  Urine: 2075 mL JP: 130 mL (90mL, 40mL)  Recent Labs  [facility] 08/20/11 0320 08/19/11 1512   HGB 14.2 14.6   WBC 23.7* --   PLT 213 --    Recent Labs  [facility] 08/20/11 0535 08/20/11 0320   NA 138 134*   K 4.0 QUESTIONABLE RESULTS, RECOMMEND RECOLLECT TO VERIFY   CL 101 100   CO2 31 26   BUN 6 7   CREATININE 0.74 0.73   CALCIUM 9.6 9.3   GFRNONAA 85* 86*   GFRAA >90 >90     No results found  for this basename: PT:2,INR:2,APTT:2 in the last 72 hours   No components found with this basename: ABG:2     Assessment: Right non-functioning kidney POD#1 Right laparoscopic nephrectomy.  Pulmonary failure following surgery requiring prolonged intubation.  Plan: -Continue pulmonary toilet. -Continue clear liquid diet. -Bowel regiment: add dulcolax suppository. -Ambulate and up to chair. -Remain in stepdown for now; will evaluate later today for possible transfer.   Natalia Leatherwood, MD [phone]

## 2011-08-20 NOTE — Op Note (Signed)
NAMESARYN, CHERRY NO.:  [id number]  MEDICAL RECORD NO.:  [id number]  LOCATION:  1233                         FACILITY:  [facility]  PHYSICIAN:  Natalia Leatherwood, MD    DATE OF BIRTH:  10-04-1940  DATE OF PROCEDURE:  08/19/2011 DATE OF DISCHARGE:                              OPERATIVE REPORT   SURGEON:  Natalia Leatherwood, MD  ASSISTANT:  Pecola Leisure, PA  PREOPERATIVE DIAGNOSIS:  Right nonfunctioning kidney.  POSTOPERATIVE DIAGNOSIS:  Right nonfunctioning kidney.  PROCEDURE PERFORMED:  Right laparoscopic nephrectomy with a 22 modifier as this case took almost twice as long as normal to perform a routine nephrectomy due to previous infection making surgical planes difficult to follow, and due to her body habitus as her BMI is >55.  COMPLICATIONS:  None.  ESTIMATED BLOOD LOSS:  Less than 50 cc.  IV FLUIDS:  Crystalloid.  No transfusions.  SPECIMEN:  Right kidney sent for pathology.  DRAINS:  Foley catheter and JP drain in the right lower quadrant.  FINDINGS:  Atrophic right small kidney with obliterated surgical planes.  HISTORY OF PRESENT ILLNESS:  This is a 71 year old female who has recurring urinary tract infections.  Upon workup, she was found to have very large right kidney stones.  She had an atrophic kidney and a renal scan showed that she had a nonfunctioning right kidney.  She was counseled regarding management options and she elected to have a laparoscopic right nephrectomy.  She presents today for that procedure.  PROCEDURE IN DETAIL:  Informed consent was obtained.  The patient was taken to the operating room where she was placed in supine position.  IV antibiotics were infused and general anesthesia was induced.  She received 5000 mg of heparin subcutaneously prior to being taken to the operating room.  Next, the patient's genitals were prepped and draped in usual sterile fashion and a Foley catheter was placed using sterile technique,  10 cc sterile water placed in the balloon.  General anesthesia was induced and a time-out was performed in which the correct patient, surgical site, and procedure were all identified and agreed upon by the team.  She is placed in a lateral decubitus position with the right side up, making sure to use axillary rolls and used gel padding to pad all pertinent neurovascular pressure points.  She was placed on a bean bag as well and her arms were positioned by the anesthesiology staff with the right arm using a right arm support.  Legs were padded with pillows and gel pads.  The patient was secured into place with a beanbag and tape.  The table was flexed, so that we could separate her anterior superior iliac spine and her rib cage.  After this was in the correct position, the beanbag was desufflated to maintain in place.  After this, the patient had her flank and abdomen were prepped and draped in usual sterile fashion.  Following this, a Veress needle was attempted to gain access for pneumoperitoneum, however, due to the patient's body habitus, it was felt that switching to a Hasson technique would be best performed.  This was performed by making incision lateral to the rectus muscle  and dissecting down to the fascia.  After this was done, the fascia was entered under direct visualization and then a trocar was placed and her abdomen was insufflated.  After this was done, the camera was placed into the abdomen and there were no injuries noted to the bowel or liver or any other intra-abdominal contents.  Next, the other trocars were placed under direct visualization, injected with Exparel first and then making incision in the skin with a scalpel.  A 10 mm port was placed slightly inferior and lateral to the camera port and then a 5 mm port was placed superior along the lateral line of the rectus.  Two other 5 mm ports were placed as well 1 more lateral to this to allow retraction of the liver  and 1 on her right lower quadrant to allow for counter traction of the kidney during dissection.  After these had been placed, dissection was carried out by taking down adhesions of the colon to the right lateral abdominal wall.  Following this, the white line of Toldt was incised and the colon was reflected medially. The duodenum was identified and released medially without any direct traction on the duodenum.  The inferior vena cava was identified and then dissection was carried out inferior to the kidney to identify the gonadal vein.  This was dissected up to its insertion in the inferior vena cava and then dissection was carried out superiorly until the hilum was encountered.  First, there were several small veins directly coming off the IVC that appeared to be going to the kidney.  These were ligated with titanium clips and then sharply divided.  After this was done, the right renal artery was identified, it was skeletonized and Endo-GIA vascular stapler was fired across this.  There was good hemostasis and then dissection was continued until the right renal vein was identified.  A 60 mm Endo-GIA was fired across this area, however, it was felt that it did not fire all the way across and so a 45 degree load was fired more proximal to the IVC.  After this was complete, dissection was carried out around the rest of the kidney.  Attempted dissection to leave the adrenal gland behind was difficult as surgical planes were ablated due to this patient's frequent kidney infections. In fact, the dissection took twice long as normal because of this. Finally, the remainder of the kidney was dissected out with the LigaSure and removed.  The collecting system was entered while dissection out because of this obliteration of dissected planes.  One stone did come out of the kidney and was removed.  Because of this, it was a clean contaminated procedure.  After the kidney was dissected free of its  bed, it was placed in a 15 mm EndoCatch bag.  After this was completed, the renal fossa was irrigated with over 1 L of sterile normal saline and drained.  Because of the clean contaminated nature of the procedure, it was felt that a Jackson-Pratt drain will be left behind and this was placed through the 5 mm port in the right lower quadrant up into the renal hilum.  There was good hemostasis noted of the renal hilum and the remainder of the abdomen was evaluated and there were no injuries to any of the other intra-abdominal organs.  The 10 mm port that was the assistant port, was closed with a suture passing device under direct visualization.  All the ports were evaluated and found to be free  of bleeding.  Next, all ports were removed and the abdomen was desufflated. The camera port was enlarged in order to remove the specimen.  After this was done, the fascia was closed with a running 0 PDS.  After this was done, the wounds were irrigated out and her skin was closed with staples.  The drain was sutured into place the drain suture and patient was placed back in supine position.  Anesthesia was reversed.  She was unable to be extubated in the OR and was taken to PACU intubated.  Patient will be kept in the hospitalfor pain control and observation.          ______________________________ Natalia Leatherwood, MD     DW/MEDQ  D:  08/19/2011  T:  08/20/2011  Job:  [id number]

## 2011-08-20 NOTE — Progress Notes (Signed)
GU  Patient up out of bed to chair today.  Pain controlled.  Nausea with PO fluids.  No flatus or BM.  She has maintained her airway today without issues.  PE: General: NAD, AAO CV: RRR Chest: CTA-B Abd: appropriately TTP, incisions dressed.  A/P: POD#1 Right laparoscopic nephrectomy.  Acute respiratory failure.  Obesity. -Respiratory failure resolved.  Likely due to baseline asthma, anesthetic, and obesity. -Will transfer to telemetry bed out of stepdown. -Discontinue foley catheter in the morning.

## 2011-08-20 NOTE — Clinical Documentation Improvement (Signed)
BMI DOCUMENTATION CLARIFICATION QUERY  THIS DOCUMENT IS NOT A PERMANENT PART OF THE MEDICAL RECORD  TO RESPOND TO THE THIS QUERY, FOLLOW THE INSTRUCTIONS BELOW:  1. If needed, update documentation for the patient's encounter via the notes activity.  2. Access this query again and click edit on the In [employer].  3. After updating, or not, click F2 to complete all highlighted (required) fields concerning your review. Select "additional documentation in the medical record" OR "no additional documentation provided".  4. Click Sign note button.  5. The deficiency will fall out of your In Basket *Please let us know if you are not able to complete this workflow by phone or e-mail (listed below).         08/20/11  Dear Dr. Margarita Grizzle, Al[facility]s [facility]  In an effort to better capture your patient's severity of illness, reflect appropriate length of stay and utilization of resources, a review of the patient medical record has revealed the following indicators.    Based on your clinical judgment, please clarify and document in a progress note and/or discharge summary the clinical condition associated with the following supporting information:  In responding to this query please exercise your independent judgment.  The fact that a query is asked, does not imply that any particular answer is desired or expected.  Pt's BMI= 55.4. Please clarify whether or not BMI can be linked to one of he diagnoses listed below and document in pn  and d/c. Thank You!  [facility]: When linking BMI to a diagnosis please document both BMI and diagnosis together in pn for accuracy of SOI and ROM.    Possible Clinical conditions  Morbid Obesity W/ BMI=   Underweight w/BMI=  Other condition___________________  Cannot Clinically determine _____________  Risk Factors: Sign & Symptoms: BMI-55.4 5'4"/321lbs    Treatment Monitoring   Reviewed:  no additional documentation provided  08/24/2011 ljh  Thank You,  Enis Slipper  RN, BSN, CCDS Clinical Documentation Specialist [facility] Dept Pager: [phone] / E-mail: [email or web address]  Health Information Management [place]

## 2011-08-20 NOTE — Progress Notes (Signed)
Tina Chase is a 71 y.o. female admitted on 08/19/2011 with R nephrolithiasis and R nonfunctional kidney s/p R nephrectomy.  Lines / Drains:  1/16 ETT >> 1/16 1/16 Foley  1/16 R JP  Cultures:  None  Antibiotics:  1/16 Cefazolin  Tests / Events:  1/16 R nephrectomy  SUBJECTIVE: Increased pain with cough.  No sputum.  Denies chest pain, dyspnea.  OBJECTIVE:  Blood pressure 126/61, pulse 88, temperature 98.1 F (36.7 C), temperature source Oral, resp. rate 21, height 5\' 1"  (1.549 m), weight 334 lb 14.1 oz (151.9 kg), last menstrual period 09/23/1986, SpO2 95.00%. Wt Readings from Last 3 Encounters:  08/20/11 334 lb 14.1 oz (151.9 kg)  08/20/11 334 lb 14.1 oz (151.9 kg)  08/11/11 321 lb 11.2 oz (145.922 kg)   Body mass index is 63.27 kg/(m^2).  I/O last 3 completed shifts: In: 3110 [I.V.:2700; IV Piggyback:410] Out: 2255 [Urine:2075; Drains:130; Blood:50]  Vent Mode:  [-] PSV FiO2 (%):  [0.6 %-100 %] 40 % PEEP:  [5 cmH20] 5 cmH20 Pressure Support:  [5 cmH20-20 cmH20] 5 cmH20  General: No acute distress, obese Neuro: Awake, alert, follows commands, moves all extremities HEENT: PERRL  Neck: No JVD  Cardiovascular: RRR, no murmurs  Lungs: Bilateral very diminished breath sounds, no wheezing  Abdomen: Obese, mildly tender, diminished breath sounds, sanguinous exudate form R JP drain  Musculoskeletal: No edema  Skin: No edema  BMET    Component Value Date/Time   NA 138 08/20/2011 0535   K 4.0 08/20/2011 0535   CL 101 08/20/2011 0535   CO2 31 08/20/2011 0535   GLUCOSE 131* 08/20/2011 0535   BUN 6 08/20/2011 0535   CREATININE 0.74 08/20/2011 0535   CREATININE 0.78 09/23/2010 0925   CALCIUM 9.6 08/20/2011 0535   CALCIUM 10.5 06/10/2009 1342   GFRNONAA 85* 08/20/2011 0535   GFRAA >90 08/20/2011 0535    CBC    Component Value Date/Time   WBC 23.7* 08/20/2011 0320   RBC 4.63 08/20/2011 0320   HGB 14.2 08/20/2011 0320   HCT 42.6 08/20/2011 0320   PLT 213 08/20/2011 0320   MCV  92.0 08/20/2011 0320   MCH 30.7 08/20/2011 0320   MCHC 33.3 08/20/2011 0320   RDW 14.1 08/20/2011 0320   LYMPHSABS 2.7 02/17/2011 1525   MONOABS 0.8 02/17/2011 1525   EOSABS 0.1 02/17/2011 1525   BASOSABS 0.0 02/17/2011 1525        Dg Chest Port 1 View  08/19/2011  *RADIOLOGY REPORT*  Clinical Data: ET tube placement  PORTABLE CHEST - 1 VIEW  Comparison: None.  Findings: Endotracheal tube has been inserted and lies approximately 3 cm above the carina.  The heart is enlarged.  There is mild vascular congestion.  There is a calcified tortuous aorta. There is worsening aeration compared with 08/11/2011.  Mild degenerative change thoracic spine.  IMPRESSION: Cardiomegaly with mild vascular congestion.  ET tube 3 cm above carina.  Original Report Authenticated By: Elsie Stain, M.D.    ASSESSMENT/PLAN:  Acute respiratory failure after Rt nephrectomy with slow to wake up from anesthesia -successfully extubated 1/16 -mobilize as tolerated -bronchial hygiene -post-op care, pain control per urology  Hx of Asthma -no evidence for bronchospasm -prn albuterol  Hx of Hypertension, CAD  -Restart Amlodipine, Lasix, Imdur, Cozaar, Metoprolol when able to take PO  Leukocytosis -likely related to acute stress after surgery -repeat CBC intermittently>>defer to urology  Hyperglycemia -monitor CBG, and further assessment as outpt  PCCM will sign off.  Please call if further help needed  Vadhir Mcnay Pager:  [phone] 08/20/2011, 8:22 AM

## 2011-08-21 LAB — BASIC METABOLIC PANEL
CO2: 30 mEq/L (ref 19–32)
Chloride: 97 mEq/L (ref 96–112)
Glucose, Bld: 114 mg/dL — ABNORMAL HIGH (ref 70–99)
Sodium: 135 mEq/L (ref 135–145)

## 2011-08-21 LAB — CBC
Hemoglobin: 13.9 g/dL (ref 12.0–15.0)
MCH: 30.7 pg (ref 26.0–34.0)
MCV: 94.7 fL (ref 78.0–100.0)
Platelets: 179 10*3/uL (ref 150–400)
RBC: 4.53 MIL/uL (ref 3.87–5.11)
WBC: 17.2 10*3/uL — ABNORMAL HIGH (ref 4.0–10.5)

## 2011-08-21 MED ORDER — HYDROCODONE-ACETAMINOPHEN 5-325 MG PO TABS: 1.0000 | ORAL_TABLET | ORAL | Status: AC | PRN

## 2011-08-21 MED ORDER — ENOXAPARIN SODIUM 40 MG/0.4ML ~~LOC~~ SOLN
40.0000 mg | SUBCUTANEOUS | Status: DC
  Administered 2011-08-21 – 2011-08-24 (×4): 40 mg via SUBCUTANEOUS
  Filled 2011-08-21 (×6): qty 0.4

## 2011-08-21 MED ORDER — FENTANYL CITRATE 0.05 MG/ML IJ SOLN: 25.0000 ug | INTRAMUSCULAR | Status: DC | PRN

## 2011-08-21 MED ORDER — SENNOSIDES-DOCUSATE SODIUM 8.6-50 MG PO TABS: 1.0000 | ORAL_TABLET | Freq: Two times a day (BID) | ORAL | Status: DC

## 2011-08-21 MED ORDER — SODIUM CHLORIDE 0.9 % IV BOLUS (SEPSIS)
500.0000 mL | Freq: Once | INTRAVENOUS | Status: AC
  Administered 2011-08-21: 500 mL via INTRAVENOUS

## 2011-08-21 NOTE — Plan of Care (Signed)
Problem: Phase II Progression Outcomes Goal: Tubes/drains D/C'd Outcome: Progressing Foley d/c'd 1/18

## 2011-08-21 NOTE — Progress Notes (Signed)
GU  Patient had BM and flatus today.  Urine output is low.  Pain controlled.  Episode of emesis from coughing.  Up out of bed twice today.  Filed Vitals:   08/21/11 1800  BP: 143/72  Pulse: 87  Temp:   Resp: 25    Gen: NAD Abd: soft, appropriately TTP Chest: CTA-B CV: RRR GU: Foley still in place.  A/P: Right laparoscopic nephrectomy for infected non-functioning kidney.  Acute respiratory failure. Obesity. -Bolus NS .  Want to be careful not to fluid overload. -Encouraged ambulation. -Pulmonary toilet. -Remain in Stepdown due to continued need for close nursing care and continued sedation.

## 2011-08-21 NOTE — Plan of Care (Signed)
Problem: Phase II Progression Outcomes Goal: OOB to chair, ambulate in room BID Outcome: Not Progressing Pt up to chair but unable to walk in hall had to be lifted back to bed

## 2011-08-21 NOTE — Progress Notes (Signed)
Urology Progress Note  Subjective:     No acute urologic events overnight. Patient more sedated overnight.  Had a desaturation while up in chair.  Negative flatus or BM.  Pain well controlled.    Objective:  Patient Vitals for the past 24 hrs:  BP Temp Temp src Pulse Resp SpO2 Weight  08/21/11 0402 - 98.5 F (36.9 C) Oral - - - -  08/21/11 0400 145/71 mmHg - - 85  23  95 % -  08/21/11 0200 142/78 mmHg - - 87  24  93 % 151.7 kg (334 lb 7 oz)  08/21/11 0002 - 98.1 F (36.7 C) Oral - - - -  08/21/11 0000 144/68 mmHg - - 95  23  93 % -  08/20/11 2244 157/69 mmHg - - 106  - - -  08/20/11 2200 157/69 mmHg - - 97  23  94 % -  08/20/11 2000 146/59 mmHg 97.9 F (36.6 C) Oral 100  26  92 % -  08/20/11 1800 138/61 mmHg - - 93  25  95 % -  08/20/11 1600 143/70 mmHg 98.2 F (36.8 C) Oral 85  26  96 % -  08/20/11 1400 142/61 mmHg - - 81  22  96 % -  08/20/11 1200 147/60 mmHg 97.7 F (36.5 C) Oral 83  24  96 % -  08/20/11 1000 149/73 mmHg - - 96  23  95 % -  08/20/11 0800 133/70 mmHg 98.1 F (36.7 C) Oral 87  21  95 % -    Physical Exam: General:  No acute distress, awake, answers questions appropriately Cardiovascular:    [x]   S1/S2 present, RRR  []   Irregularly irregular Chest:  CTA-B Abdomen:               []  Soft, appropriately TTP  []  Soft, NTTP  [x]  Soft, appropriately TTP, incision(s) clean/dry/intact, JP serosanguinous.  Genitourinary: Foley in place Foley:  Draining clear yellow urine.    I/O last 3 completed shifts: In: 3753.2 [I.V.:3189.2; IV Piggyback:564] Out: 2805 [Urine:2605; Drains:150; Blood:50]  Recent Labs  [facility] 08/21/11 0310 08/20/11 0320   HGB 13.9 14.2   WBC 17.2* 23.7*   PLT 179 213    Recent Labs  Basename 08/21/11 0310 08/20/11 0535   NA 135 138   K 4.9 4.0   CL 97 101   CO2 30 31   BUN 6 6   CREATININE 0.68 0.74   CALCIUM 10.0 9.6   GFRNONAA 88* 85*   GFRAA >90 >90     No results found for this basename: PT:2,INR:2,APTT:2 in the  last 72 hours   No components found with this basename: ABG:2     Assessment: POD#2 Right laparoscopic nephrectomy. Acute respiratory failure. Obesity. Plan: -Will cancel transfer out of Stepdown to keep a closer eye on the patient in light of her desaturation and increased sedation.  Respiratory issues and sedation likely associated with obesity (BMI >55) which makes it more difficult for her to breath and also likely sequesters anesthetic. Will d/c morphine and start fentanyl. -Will keep foley catheter in place while still sedated. -Discontinued JP drain today. -Continue clear liquids. -Continue DVT prophylaxis. -Physical therapy to see today.   Natalia Leatherwood, MD [phone]

## 2011-08-21 NOTE — Progress Notes (Signed)
ANTICOAGULATION CONSULT NOTE - Initial Consult  Pharmacy Consult for Lovenox Indication: VTE prophylaxis  Allergies  Allergen Reactions  . Hydralazine Anaphylaxis    Swelling tongue.  . Aspirin     REACTION: hives  . Crab (Shellfish Allergy) Hives  . Lisinopril     REACTION: angioedema    Patient Measurements: Height: 5\' 1"  (154.9 cm) Weight: 334 lb 7 oz (151.7 kg) IBW/kg (Calculated) : 47.8   Vital Signs: Temp: 98.5 F (36.9 C) (01/18 0402) Temp src: Oral (01/18 0402) BP: 146/71 mmHg (01/18 0600) Pulse Rate: 89  (01/18 0600)  Labs:  Basename 08/21/11 0310 08/20/11 0535 08/20/11 0320 08/19/11 1512  HGB 13.9 -- 14.2 --  HCT 42.9 -- 42.6 43.8  PLT 179 -- 213 --  APTT -- -- -- --  LABPROT -- -- -- --  INR -- -- -- --  HEPARINUNFRC -- -- -- --  CREATININE 0.68 0.74 0.73 --  CKTOTAL -- -- -- --  CKMB -- -- -- --  TROPONINI -- -- -- --   Estimated Creatinine Clearance: 95 ml/min (by C-G formula based on Cr of 0.68).  Medical History: Past Medical History  Diagnosis Date  . Hyperlipidemia   . Hypertension   . Hyperparathyroidism   . Obesity   . Hyperglycemia   . Hypercalcemia   . Asthma   . Shortness of breath     wtih exertion   . Chronic kidney disease     right non functioning kidney   . Arthritis     left knee  and left shoulder     Medications:  Scheduled:    . acetaminophen  1,000 mg Intravenous Q6H  . amLODipine  10 mg Oral QAC lunch  . bisacodyl  10 mg Rectal BID  . bisacodyl  10 mg Rectal NOW  .  ceFAZolin (ANCEF) IV  2 g Intravenous Q8H  . furosemide  40 mg Oral BID  . influenza  inactive virus vaccine  0.5 mL Intramuscular Tomorrow-1000  . isosorbide mononitrate  120 mg Oral Daily  . losartan  25 mg Oral QAC lunch  . metoprolol  50 mg Oral BID  . pantoprazole  40 mg Oral Q1200  . polyethylene glycol  17 g Oral Daily  . senna-docusate  1 tablet Oral BID  . DISCONTD: albuterol  1 puff Inhalation Q4H  . DISCONTD: heparin  5,000 Units  Subcutaneous Q8H  . DISCONTD: pantoprazole (PROTONIX) IV  40 mg Intravenous Q24H    Assessment: 71 yo female s/p R nephrectomy on 1/16. Md d/c'd SQ heparin this AM and to begin Lovenox for VTE prophylaxis per pharmacy dosing. Noted that patient is 151.7 kg with stable Hgb and good renal in remaining kidney  Goal of Therapy:  heparin level 0.3-0.6 units/mL   Plan:  Would normally dose Lovenox at dose of 0.5mg /kg in patient 151.7kg but considering patient's right nephrectomy and unsure how much Lovenox would be cleared with remaining kidney at this time, will start Lovenox 40mg  SQ q24h and consider checking a heparin level at steady state   Berkley Harvey 08/21/2011,7:02 AM

## 2011-08-21 NOTE — Progress Notes (Signed)
Physical Therapy Evaluation Patient Details Name: Tina Chase MRN: [id number] DOB: 1940/10/01 Today's Date: 08/21/2011 0950-1020 Ev2  Problem List:  Patient Active Problem List  Diagnoses  . HYPERPARATHYROIDISM NOS  . HYPERLIPIDEMIA  . HYPERTENSION  . HYPERGLYCEMIA  . Chest pain  . DYSPNEA    Past Medical History:  Past Medical History  Diagnosis Date  . Hyperlipidemia   . Hypertension   . Hyperparathyroidism   . Obesity   . Hyperglycemia   . Hypercalcemia   . Asthma   . Shortness of breath     wtih exertion   . Chronic kidney disease     right non functioning kidney   . Arthritis     left knee  and left shoulder    Past Surgical History:  Past Surgical History  Procedure Date  . Tubal ligation   . Other surgical history     D and C x 3     PT Assessment/Plan/Recommendation PT Assessment Clinical Impression Statement: Patient presents s/p laparoscopic right nephrectomy with decreased mobility, decreased srength, limited activity tolerance (desat to apbout 70% with O2 removal during transfer due to lines too short; returned to >90% after O2 replaced at 2LPM and cues for PLB.)  Patient also with decreased balance and decreased safety awareness and knowlege of use of DME.  She will benefit from skilled PT during acute stay to maximize independence for hopeful return home with daughter to assist. PT Recommendation/Assessment: Patient will need skilled PT in the acute care venue PT Problem List: Decreased strength;Decreased activity tolerance;Decreased mobility;Decreased balance;Decreased knowledge of use of DME PT Therapy Diagnosis : Abnormality of gait;Generalized weakness;Acute pain PT Plan PT Frequency: Min 3X/week PT Treatment/Interventions: DME instruction;Gait training;Functional mobility training;Therapeutic activities;Therapeutic exercise;Balance training;Patient/family education PT Recommendation Follow Up Recommendations: Home health  PT;Supervision/Assistance - 24 hour Equipment Recommended: Rolling walker with 5" wheels (wide) PT Goals  Acute Rehab PT Goals PT Goal Formulation: With patient/family Time For Goal Achievement: 7 days Pt will Roll Supine to Left Side: with rail;with supervision PT Goal: Rolling Supine to Left Side - Progress: Goal set today Pt will go Supine/Side to Sit: with min assist;with rail PT Goal: Supine/Side to Sit - Progress: Goal set today Pt will go Sit to Supine/Side: with min assist;with rail PT Goal: Sit to Supine/Side - Progress: Goal set today Pt will go Sit to Stand: with min assist;with upper extremity assist PT Goal: Sit to Stand - Progress: Goal set today Pt will go Stand to Sit: with min assist;with upper extremity assist PT Goal: Stand to Sit - Progress: Goal set today Pt will Ambulate: 51 - 150 feet;with supervision;with rolling walker PT Goal: Ambulate - Progress: Goal set today  PT Evaluation Precautions/Restrictions  Precautions Precautions: Fall Prior Functioning  Home Living Lives With: Alone Type of Home: Apartment (senior apartments) Home Layout: One level Home Access: Elevator Bathroom Shower/Tub: Engineer, manufacturing systems: Standard Home Adaptive Equipment: Grab bars around toilet;Grab bars in shower Prior Function Level of Independence: Independent with basic ADLs;Independent with transfers;Independent with gait;Independent with homemaking with ambulation Driving: Yes Cognition Cognition Arousal/Alertness: Awake/alert Orientation Level: Oriented to place;Disoriented to time;Oriented to person;Disoriented to situation Cognition - Other Comments: Thought December "2003", then "2023", thought here with complications from [facility] in 1993 Sensation/Coordination Sensation Light Touch: Impaired Detail Light Touch Impaired Details: Impaired LLE (feels decreased to crude touch on left as compared to right) Extremity Assessment RLE Assessment RLE Assessment:  Exceptions to [facility] RLE AROM (degrees) RLE Overall AROM Comments: AAROM  limited due to pain in abdomen and soft tissue restrictions RLE Strength RLE Overall Strength Comments: lifts briefly antigravity with assist (grossly 3-/5) LLE Assessment LLE Assessment: Exceptions to WFL LLE AROM (degrees) LLE Overall AROM Comments: AAROM limited with soft tissue restrictions LLE Strength LLE Overall Strength Comments: able to lift antigravity briefly; grossly 3/5 Mobility (including Balance) Bed Mobility Bed Mobility: Yes Rolling Left: With rail;3: Mod assist Rolling Left Details (indicate cue type and reason): HOB elevated approx 30* Left Sidelying to Sit: 2: Max assist Left Sidelying to Sit Details (indicate cue type and reason): cues for technique and assist for legs over edge and to come up to sit Sitting - Scoot to Edge of Bed: 4: Min assist Sitting - Scoot to [employer] of Bed Details (indicate cue type and reason): cues for technique and assist with pad under patient Transfers Transfers: Yes Sit to Stand: 1: +2 Total assist;From bed;With upper extremity assist Sit to Stand Details (indicate cue type and reason): cues for safety, to push up with legs, pt=40-50% Stand to Sit: To chair/3-in-1;With upper extremity assist;With armrests;3: Mod assist Stand to Sit Details: cues for controlled lowering, and technique Ambulation/Gait Ambulation/Gait: Yes Ambulation/Gait Assistance: 1: +2 Total assist Ambulation/Gait Assistance Details (indicate cue type and reason): pt=50%, knees buckling, cues for technique and safety with taking steps around to chair Ambulation Distance (Feet): 3 Feet Assistive device: Rolling walker Gait Pattern: Decreased stride length    Exercise    End of Session PT - End of Session Equipment Utilized During Treatment: Gait belt Activity Tolerance: Patient limited by fatigue;Patient limited by pain Patient left: in chair Nurse Communication: Mobility status for  transfers General Behavior During Session: [facility] for tasks performed Cognition: [facility] for tasks performed  [facility] 08/21/2011, 12:05 PM

## 2011-08-22 LAB — CBC
HCT: 38.1 % (ref 36.0–46.0)
Platelets: 204 10*3/uL (ref 150–400)
RDW: 13.7 % (ref 11.5–15.5)
WBC: 15.1 10*3/uL — ABNORMAL HIGH (ref 4.0–10.5)

## 2011-08-22 LAB — BASIC METABOLIC PANEL
Chloride: 101 mEq/L (ref 96–112)
Creatinine, Ser: 0.62 mg/dL (ref 0.50–1.10)
GFR calc Af Amer: >90 mL/min (ref >90)
Sodium: 137 mEq/L (ref 135–145)

## 2011-08-22 MED ORDER — HYDROCODONE-ACETAMINOPHEN 5-325 MG PO TABS
1.0000 | ORAL_TABLET | Freq: Four times a day (QID) | ORAL | Status: DC | PRN
  Administered 2011-08-24 – 2011-08-25 (×2): 1 via ORAL
  Filled 2011-08-22 (×2): qty 1

## 2011-08-22 NOTE — Progress Notes (Signed)
Patient ID: Tina Chase, female   DOB: 1940/11/13, 71 y.o.   MRN: [id number]  3 Days Post-Op Subjective: Pt doing well.  More awake and alert.  Tolerating clear liquids.  Positive flatus and BM this morning.  No nausea or vomiting except one episode yesterday with coughing.  Objective: Vital signs in last 24 hours: Temp:  [97.7 F (36.5 C)-99 F (37.2 C)] 98.5 F (36.9 C) (01/19 0800) Pulse Rate:  [74-99] 86  (01/19 0800) Resp:  [16-27] 23  (01/19 0800) BP: (116-156)/(49-76) 132/50 mmHg (01/19 0800) SpO2:  [93 %-97 %] 95 % (01/19 0800) Weight:  [150.6 kg (332 lb 0.2 oz)] 150.6 kg (332 lb 0.2 oz) (01/19 0400)  Intake/Output from previous day: 01/18 0701 - 01/19 0700 In: 2930 [P.O.:480; I.V.:1850; IV Piggyback:600] Out: 661 [Urine:660; Emesis/NG output:1] Intake/Output this shift: Total I/O In: 320 [P.O.:120; I.V.:200] Out: 60 [Urine:60]  Physical Exam:  General: Alert and oriented CV: RRR Lungs: Clear, decreased respiratory effort Abdomen: Soft, ND, Positive bowel sounds Incisions: C/D/I Ext: NT, No erythema  Lab Results:  Basename 08/22/11 0323 08/21/11 0310 08/20/11 0320  HGB 11.9* 13.9 14.2  HCT 38.1 42.9 42.6   BMET  Basename 08/22/11 0323 08/21/11 0310  NA 137 135  K 4.2 4.9  CL 101 97  CO2 33* 30  GLUCOSE 97 114*  BUN 7 6  CREATININE 0.62 0.68  CALCIUM 9.9 10.0     Studies/Results: No results found.  Assessment/Plan: POD # 3 s/p right L/S nephrectomy  1. Transfer to floor 2. Ambulate, IS, aggressive pulmonary toilet 3. Advance diet 4. DVT prophylaxis 5. Follow Hgb and urine output (UOP improving this morning with almost 200cc over last two hrs).  Will keep catheter to monitor UOP this morning but will remove if UOP improved throughout the day.   LOS: 3 days   Lorenz Donley,LES 08/22/2011, 9:03 AM

## 2011-08-22 NOTE — Progress Notes (Signed)
Pt denied pain all thru the night, assisted to a chair this morning with two person assist. Pt continued to be drowsy but oriented to self, place and situation. Pt's three daughters at bedside this am.

## 2011-08-23 NOTE — Progress Notes (Signed)
Patient ID: Tina Chase, female   DOB: 26-Feb-1941, 71 y.o.   MRN: [id number]  4 Days Post-Op Subjective: Pt without complaints.  She is tolerating diet although appetite is poor. Positive flatus. UOP improved last 24 hrs. Has ambulated in room but has refused to walk in halls.  Objective: Vital signs in last 24 hours: Temp:  [97.7 F (36.5 C)-99.5 F (37.5 C)] 98.1 F (36.7 C) (01/20 0554) Pulse Rate:  [72-102] 102  (01/20 0554) Resp:  [24-28] 26  (01/20 0554) BP: (130-151)/(63-80) 132/80 mmHg (01/20 0554) SpO2:  [92 %-99 %] 92 % (01/20 0554)  Intake/Output from previous day: 01/19 0701 - 01/20 0700 In: 1800 [P.O.:800; I.V.:950; IV Piggyback:50] Out: 1395 [Urine:1395] Intake/Output this shift:    Physical Exam:  General: Alert and oriented CV: RRR Lungs: Clear Abdomen: Soft, ND, Positive BS Incisions: C/D/I Ext: NT, No erythema  Lab Results:  Basename 08/22/11 0323 08/21/11 0310  HGB 11.9* 13.9  HCT 38.1 42.9   BMET  Basename 08/22/11 0323 08/21/11 0310  NA 137 135  K 4.2 4.9  CL 101 97  CO2 33* 30  GLUCOSE 97 114*  BUN 7 6  CREATININE 0.62 0.68  CALCIUM 9.9 10.0     Studies/Results: No results found.  Assessment/Plan: 1. D/C Foley 2. Ambulate in halls, IS use.  Importance of walking explained to patient and potential consequences discussed if she does not ambulate.  Will also order physical therapy to assist patient. 3. Check labs tomorrow 4. Decrease IVF    LOS: 4 days   Kinya Meine,LES 08/23/2011, 8:26 AM

## 2011-08-24 LAB — BASIC METABOLIC PANEL
BUN: 8 mg/dL (ref 6–23)
Chloride: 96 mEq/L (ref 96–112)
GFR calc Af Amer: >90 mL/min (ref >90)
Potassium: 3.6 mEq/L (ref 3.5–5.1)

## 2011-08-24 LAB — CBC
HCT: 37.5 % (ref 36.0–46.0)
Hemoglobin: 12.1 g/dL (ref 12.0–15.0)
WBC: 11.1 10*3/uL — ABNORMAL HIGH (ref 4.0–10.5)

## 2011-08-24 NOTE — Progress Notes (Signed)
  CARE MANAGEMENT NOTE 08/24/2011  Patient:  Tina Chase, Tina Chase   Account Number:  [id number]  Date Initiated:  08/24/2011  Documentation initiated by:  Colleen Can  Subjective/Objective Assessment:   dx post op removal of non functioning kidney     Action/Plan:   CM spoke with patient. Plans are for herto retrun to her home in [place] where daughter will be caregiver. MD has ordered HHPT and DME. Pt offered choice with list of agencies for [facility]. She wants agency in network.   Anticipated DC Date:  08/25/2011   Anticipated DC Plan:  HOME W HOME HEALTH SERVICES  In-house referral  NA      DC Planning Services  CM consult      PAC Choice  DURABLE MEDICAL EQUIPMENT  HOME HEALTH   Choice offered to / List presented to:  C-1 Patient   DME arranged  3-N-1  Levan Hurst      DME agency  [facility]     HH arranged  HH-2 PT      [facility] agency  [facility]   Status of service:  In process, will continue to follow  Comments:  List of [facility] agencies placed in shadow chart. [facility] can provide [facility] services and DME. CM will follow up

## 2011-08-24 NOTE — Progress Notes (Signed)
Physical Therapy Treatment Patient Details Name: Tina Chase MRN: [id number] DOB: Dec 30, 1940 Today's Date: 08/24/2011  Nephrectomy POD #2 9:45 - 10:05 1 gt  PT Assessment/Plan  PT - Assessment/Plan Comments on Treatment Session: Pt plans to have daughter stay with her until Feb 5th at which point the daughter returns to [facility] PT Plan: Discharge plan remains appropriate Follow Up Recommendations: Home health PT Equipment Recommended: Rolling walker with 5" wheels (pt mildy unsteady and would benefit from RW) PT Goals  Acute Rehab PT Goals PT Goal Formulation: With patient Pt will go Sit to Stand: with min assist PT Goal: Sit to Stand - Progress: Met Pt will go Stand to Sit: with min assist PT Goal: Stand to Sit - Progress: Met Pt will Ambulate: 51 - 150 feet;with supervision;with rolling walker PT Goal: Ambulate - Progress: Partly met  PT Treatment Precautions/Restrictions  Precautions Precautions: Fall Restrictions Weight Bearing Restrictions: No Mobility (including Balance) Bed Mobility Bed Mobility: No (Pt OOb in recliner) Transfers Transfers: Yes Sit to Stand: 5: Supervision;With armrests Sit to Stand Details (indicate cue type and reason): 25% VC's to push up from chai vs pull up from RW Stand to Sit: 5: Supervision;To chair/3-in-1 Stand to Sit Details: good use of hand placement Ambulation/Gait Ambulation/Gait: Yes Ambulation/Gait Assistance: 5: Supervision Ambulation/Gait Assistance Details (indicate cue type and reason): slow gait with mod C/O R lower Abd pain with activity Ambulation Distance (Feet): 50 Feet Assistive device: Rolling walker Gait Pattern: Step-through pattern Gait velocity: wide BOS Stairs: No Wheelchair Mobility Wheelchair Mobility: No    Exercise    End of Session PT - End of Session Equipment Utilized During Treatment: Gait belt Activity Tolerance: Patient tolerated treatment well Patient left: in chair;with call bell in  reach Nurse Communication: Mobility status for ambulation;Other (comment) (RN observed pt amb in hallway with PT) General Behavior During Session: [facility] for tasks performed Cognition: [facility] for tasks performed  Felecia Shelling  PTA [facility]  Acute  Rehab Pager     [phone]

## 2011-08-24 NOTE — Progress Notes (Signed)
Urology Progress Note  Subjective:     No acute urologic events overnight. Ambulation:  [ x ] Positive  [  ] Negative Flatus:   [ x ] Positive  [  ] Negative Bowel movement [ x ] Positive  [  ] Negative  Pain: [ x ] Well controlled.  [  ] Needs adjustment.  Objective:  Patient Vitals for the past 24 hrs:  BP Temp Temp src Pulse Resp SpO2  08/24/11 0439 151/72 mmHg 98.4 F (36.9 C) Oral 87  20  97 %  08/23/11 2117 179/81 mmHg 98.6 F (37 C) Oral 98  20  95 %  08/23/11 1333 147/81 mmHg 98.6 F (37 C) Oral 84  22  96 %    Physical Exam: General:  No acute distress, awake Cardiovascular:    [x]   S1/S2 present, RRR  []   Irregularly irregular Chest:  CTA-B Abdomen:               []  Soft, appropriately TTP  []  Soft, NTTP  [x]  Soft, appropriately TTP, incision(s) clean/dry/intact  Genitourinary: No foley catheter     I/O last 3 completed shifts: In: 1610 [P.O.:960; I.V.:650] Out: 2601 [Urine:2600; Stool:1]  Recent Labs  [facility] 08/24/11 0405 08/22/11 0323   HGB 12.1 11.9*   WBC 11.1* 15.1*   PLT 209 204    Recent Labs  Basename 08/24/11 0405 08/22/11 0323   NA 137 137   K 3.6 4.2   CL 96 101   CO2 36* 33*   BUN 8 7   CREATININE 0.71 0.62   CALCIUM 9.3 9.9   GFRNONAA 87* >90   GFRAA >90 >90     No results found for this basename: PT:2,INR:2,APTT:2 in the last 72 hours   No components found with this basename: ABG:2     Assessment: Right infected non-functioning kidney. POD#5 Right laparoscopic nephrectomy.  Acute pulmonary failure. Obesity. Plan: -Need discharge disposition from Physical Therapy. -Will begin discharge planning.  Possible discharge home tomorrow. -Continue ambulation.   Natalia Leatherwood, MD [phone]

## 2011-08-25 ENCOUNTER — Encounter (HOSPITAL_COMMUNITY): Payer: Self-pay | Admitting: Urology

## 2011-08-25 NOTE — Progress Notes (Signed)
Patient's oxygen was checked on room air and was 87% before ambulation.  On ambulation was 94% on 3L.  Will follow up with MD about home oxygen. Will continue to monitor patient.

## 2011-08-25 NOTE — Progress Notes (Signed)
  CARE MANAGEMENT NOTE 08/25/2011  Patient:  Tina Chase, Tina Chase   Account Number:  [id number]  Date Initiated:  08/24/2011  Documentation initiated by:  Colleen Can  Subjective/Objective Assessment:   dx post op removal of non functioning kidney     Action/Plan:   CM spoke with patient. Plans are for herto retrun to her home in [place] where daughter will be caregiver. MD has ordered HHPT and DME. Pt offered choice with list of agencies for [facility]. She wants agency in network.   Anticipated DC Date:  08/25/2011   Anticipated DC Plan:  HOME W HOME HEALTH SERVICES  In-house referral  NA      DC Planning Services  CM consult      PAC Choice  DURABLE MEDICAL EQUIPMENT  HOME HEALTH   Choice offered to / List presented to:  C-1 Patient   DME arranged  3-N-1  WALKER - ROLLING  OXYGEN      DME agency  [facility]     HH arranged  HH-2 PT  HH-1 RN      [place] agency  [facility]   Status of service:  In process, will continue to follow  Comments:  08/25/2011 Tina Chase BSN CCM [phone] Orders have been written for [facility] rn and home o2 therapy. O2 down to 87% without o2 at rest. [facility] notified of request for home o2 and additional HH services .Plans are for discharge today.

## 2011-08-25 NOTE — Discharge Summary (Signed)
Physician Discharge Summary  Patient ID: Tina Chase MRN: [id number] DOB/AGE: 1941-03-03 71 y.o.  Admit date: 08/19/2011 Discharge date: 08/25/2011  Admission Diagnoses: Non-functioning right kidney. Acute pulmonary insufficiency. Obesity.  Discharge Diagnoses:  Non-functioning right kidney. Acute pulmonary insufficiency. Obesity.   Discharged Condition: fair  Hospital Course:  This patient was admitted postoperatively from a laparoscopic nephrectomy due to nonfunctioning kidney. Because of her obesity and underlying pulmonary disease she was unable to be extubated until later in the evening on the day of surgery. Because of this she was placed in the step down unit for critical care management consultation to manage her ventilator. Following this the patient remained sedated for several days and this was believed to be due to her obesity and underlying lung problems. The patient gradually regained mobility and physical therapy was consult. Once the patient was more mobile her Foley catheter was removed and she was able to void. Her JP drain was removed. She was able to be transferred to the floor. She continued to work with physical therapy. She was passing flatus and having bowel movements. She was able to increase her diet from clear liquids to a regular diet. Discharge recommendations from physical therapy were use of a rolling walker, use of a shower chair, and home physical therapy. A consultation to the case manager was placed and bee stings were set up and provided to the patient. On postoperative day 6 the patient was able to ambulate, tolerate a regular diet, and control her pain with oral medications. She was voiding well and had normal bowel function. Her oxygen saturation was 87% at rest on room air and >92% on 2 liters per nasal canula; therefore she was set up with home oxygen.  She was instructed regarding the dangers of the oxygen as well as the need to follow up with her PCP  regarding ongoing need for oxygen and further treatment of her lung disease.  It was felt that she could be discharged home at this time.  Consults: pulmonary/intensive care  Significant Diagnostic Studies: None  Treatments: antibiotics: Ancef, respiratory therapy: O2 and albuterol/atropine nebulizer and surgery: Right laparoscopic nephrectomy.  Discharge Exam: Blood pressure 118/68, pulse 97, temperature 97.5 F (36.4 C), temperature source Oral, resp. rate 20, height 5\' 1"  (1.549 m), weight 150.6 kg (332 lb 0.2 oz), last menstrual period 09/23/1986, SpO2 98.00%. See PE from progress note from date of discharge.  Disposition: Home or Self Care  Discharge Orders    Future Orders Please Complete By Expires   Discharge patient        Medication List  As of 08/25/2011  9:17 AM   STOP taking these medications         Fish Oil 1000 MG Caps         TAKE these medications         amLODipine 10 MG tablet   Commonly known as: NORVASC   Take 10 mg by mouth daily before lunch.      dextromethorphan-guaiFENesin 10-100 MG/5ML liquid   Commonly known as: ROBITUSSIN-DM   Take 5 mLs by mouth every 4 (four) hours as needed. For cough      furosemide 40 MG tablet   Commonly known as: LASIX   Take 1 tablet (40 mg total) by mouth 2 (two) times daily.      HYDROcodone-acetaminophen 5-325 MG per tablet   Commonly known as: NORCO   Take 1-2 tablets by mouth every 4 (four) hours as needed for pain.  isosorbide mononitrate 120 MG 24 hr tablet   Commonly known as: IMDUR   Take 1 tablet (120 mg total) by mouth daily.      losartan 25 MG tablet   Commonly known as: COZAAR   Take 25 mg by mouth daily before lunch.      metoprolol 50 MG tablet   Commonly known as: LOPRESSOR   Take 1 tablet (50 mg total) by mouth 2 (two) times daily.      potassium chloride SA 20 MEQ tablet   Commonly known as: K-DUR,KLOR-CON   Take 1 tablet (20 mEq total) by mouth daily.      PROAIR HFA 108 (90  BASE) MCG/ACT inhaler   Generic drug: albuterol   USE 2 PUFFS EVERY 4 TO 6 HOURS AS NEEDEDFOR WHEEZING      senna-docusate 8.6-50 MG per tablet   Commonly known as: Senokot-S   Take 1 tablet by mouth 2 (two) times daily.           Follow-up Information    Follow up with Saunders Revel, PA on 08/31/2011. (11:45 am)    Contact information:   [address] [facility] [place] [address] [phone]         Patient is to follow up with her PCP in 2 weeks for further management of her oxygen. Kathreen Cosier, MD, MD [address] / [place] [address] [phone]  Signed: Milford Cage 08/25/2011, 9:17 AM

## 2011-08-25 NOTE — Progress Notes (Signed)
  CARE MANAGEMENT NOTE 08/25/2011  Patient:  Tina Chase, Tina Chase   Account Number:  [id number]  Date Initiated:  08/24/2011  Documentation initiated by:  Colleen Can  Subjective/Objective Assessment:   dx post op removal of non functioning kidney     Action/Plan:   CM spoke with patient. Plans are for herto retrun to her home in [place] where daughter will be caregiver. MD has ordered HHPT and DME. Pt offered choice with list of agencies for [facility]. She wants agency in network.   Anticipated DC Date:  08/25/2011   Anticipated DC Plan:  HOME W HOME HEALTH SERVICES  In-house referral  NA      DC Planning Services  CM consult      PAC Choice  DURABLE MEDICAL EQUIPMENT  HOME HEALTH   Choice offered to / List presented to:  C-1 Patient   DME arranged  3-N-1  WALKER - ROLLING  OXYGEN      DME agency  [facility]     HH arranged  HH-2 PT  HH-1 RN      [facility] agency  [facility]   Status of service:  Completed, signed off Medicare Important Message given?  NO (If response is "NO", the following Medicare IM given date fields will be blank) Date Medicare IM given:   Date Additional Medicare IM given:    Discharge Disposition:  HOME W HOME HEALTH SERVICES  Per UR Regulation:    Comments:  08/25/2011 Raynelle Bring BSN CCM [phone] Orders have been written for [facility] rn and home o2 therapy. O2 down to 87% without o2 at rest. [facility] notified of request for home o2 and additional HH services .Plans are for discharge today.  List of HH agencies placed in shadow chart. [facility] can provide [facility] services and DME. CM will follow up

## 2011-08-25 NOTE — Progress Notes (Addendum)
Urology Progress Note  Subjective:     No acute urologic events overnight. Positive flatus and BM overnight.  Pain controlled with PO medications.  Voiding with ease.  Ambulating with assist of walker. Tolerating diet.  Objective:  Patient Vitals for the past 24 hrs:  BP Temp Temp src Pulse Resp SpO2  08/25/11 0507 118/68 mmHg 97.5 F (36.4 C) Oral 97  20  98 %  08/24/11 2047 130/89 mmHg 98.6 F (37 C) Oral 94  20  91 %  08/24/11 1401 125/74 mmHg 97.8 F (36.6 C) Oral 77  18  90 %    Physical Exam: General:  No acute distress, awake Cardiovascular:    [x]   S1/S2 present, RRR  []   Irregularly irregular Chest:  CTA-B Abdomen:               []  Soft, appropriately TTP  []  Soft, NTTP  [x]  Soft, appropriately TTP, incision(s) clean/dry/intact  Genitourinary: No catheter     I/O last 3 completed shifts: In: 2921.7 [P.O.:1680; I.V.:1241.7] Out: 4476 [Urine:4475; Stool:1]  Recent Labs  [facility] 08/24/11 0405   HGB 12.1   WBC 11.1*   PLT 209    Recent Labs  Basename 08/24/11 0405   NA 137   K 3.6   CL 96   CO2 36*   BUN 8   CREATININE 0.71   CALCIUM 9.3   GFRNONAA 87*   GFRAA >90     No results found for this basename: PT:2,INR:2,APTT:2 in the last 72 hours   No components found with this basename: ABG:2     Assessment: Obesity. Acute Pulmonary Failure.POD#6 Right laparoscopic nephrectomy. Plan: -Pulmonary failure resolved. -Obesity still contributes to her comorbidity and longer hospital stay. -She meets discharge criteria from urology point of view. Will discharge home once home health needs are in place. -She was given a copy of her pathology; this was reviewed with her.  Natalia Leatherwood, MD [phone]    Addendum:  Patient desaturates to 87% without oxygen at rest and requires 2L oxygen at rest; 3L of oxygen while ambulating.  Will order home oxygen.

## 2011-09-15 ENCOUNTER — Ambulatory Visit (INDEPENDENT_AMBULATORY_CARE_PROVIDER_SITE_OTHER): Payer: PRIVATE HEALTH INSURANCE | Admitting: Internal Medicine

## 2011-09-15 ENCOUNTER — Encounter: Payer: Self-pay | Admitting: Internal Medicine

## 2011-09-15 VITALS — BP 160/94 | HR 110 | Temp 97.0°F | Ht 63.0 in | Wt 308.9 lb

## 2011-09-15 DIAGNOSIS — Z905 Acquired absence of kidney: Secondary | ICD-10-CM | POA: Insufficient documentation

## 2011-09-15 DIAGNOSIS — Z0000 Encounter for general adult medical examination without abnormal findings: Secondary | ICD-10-CM | POA: Insufficient documentation

## 2011-09-15 DIAGNOSIS — E785 Hyperlipidemia, unspecified: Secondary | ICD-10-CM

## 2011-09-15 DIAGNOSIS — I10 Essential (primary) hypertension: Secondary | ICD-10-CM

## 2011-09-15 LAB — LIPID PANEL
Cholesterol: 134 mg/dL (ref 0–200)
Total CHOL/HDL Ratio: 3.6 Ratio
Triglycerides: 103 mg/dL (ref <150)
VLDL: 21 mg/dL (ref 0–40)

## 2011-09-15 NOTE — Assessment & Plan Note (Signed)
Patient had been seen in followup on 08/31/11 by nephrology, with good results. She continues to recover from this large surgery. She did not have followup with them until May.  In terms of her wound, there is still an area of granulation tissue that is 3 x 0.5 cm. This looks healthy with no signs of dehiscence or infection. The patient would feel comfortable with Steri-Strips and/or and bandaids, which is fine with me. I would like her to return in 3 weeks for a repeat wound check.  In terms of her oxygen, she was discharged with home O2. Her ambulatory O2 sat was above 96%, and her resting O2 sat was 99%. I do not think she requires oxygen any further.  In addition, the patient does complain of some resolving diarrhea as well as right lower quadrant swelling. On exam she does have marked asymmetry of the abdomen with swelling in the right lower quadrant that is firm and possibly consistent with underlying fibrosis or scar tissue. She is nontender to palpation, and there is no palpable hernia or any reducible mass. These changes may be consistent with her surgery, especially given her followup on 08/31/11 with nephrology that was unremarkable. We will continue to evaluate her as she recovers.  - Followup in 3 weeks for wound check - Discontinue oxygen - Continue to assess right lower quadrant swelling - Continue to assess resolving diarrhea

## 2011-09-15 NOTE — Progress Notes (Signed)
Subjective:     Patient ID: Tina Chase, female   DOB: 04/05/1941, 71 y.o.   MRN: [id number]  HPI Patient is a very pleasant 71 year old woman with history of hypertension, hyperlipidemia, and recent right nephrectomy for nonfunctioning right kidney on 08/19/11.  The surgery was rough on the patient, with prolonged intubation secondary to obesity, and difficult PT. However, the patient has been improving steadily over the past few weeks. She has had abdominal swelling that has been slowly decreasing, diarrhea that has been resolving, and has been getting help at home from her 4 daughters and a home care nurse.  Today, the patient would like this looked at her incision as well as assess her for home oxygen, which she does not think she needs any more.  She is compliant with her medicines, but thinks that some of them may be causing some indigestion.  Review of Systems Abdominal swelling, diarrhea, leg edema    Objective:   Physical Exam GEN: NAD.  Alert and oriented x 3.  Pleasant, conversant, and cooperative to exam. RESP:  CTAB, no w/r/r CARDIOVASCULAR: RRR, S1, S2, 2/6 HSM at LSB ABDOMEN: obese, distension over RLQ w/o TTP.  Some firmness at the lower border.  Incision is 1/2 well healed, 1/2 still with granulation tissue exposed.  No signs of dehiscence or exudates.  No erythema.  Area of granulation measures 3cm x 0.5cm.   EXT: warm and dry. No edema in b/l LE     Assessment:         Plan:

## 2011-09-15 NOTE — Assessment & Plan Note (Signed)
Blood pressure slightly elevated today in clinic, patient does say she is compliant with her medicines. She has elevated readings from prior visits, and is on multiple antihypertensive agents at this time. As Dr. Antoine Poche notes, lifestyle modifications including weight loss would likely be very beneficial for this patient. I discussed this with the patient, who was actually asking for fewer antihypertensives because of her concern for GI side effects, and we agreed that she would continue on her current regimen for now. At followup, if she continues to be hypertensive, she may need increase to her medicines. If she still has GI side effects, she may need changes in her medicines.

## 2011-09-15 NOTE — Assessment & Plan Note (Signed)
Patient never received statin therapy, we'll recheck lipids today. - Review results at followup and start statin as indicated

## 2011-09-15 NOTE — Assessment & Plan Note (Signed)
-   Mammogram in June - Working on BP - Lipids checked today, needs to be followed up - colonscopy due in 12/2018 - nonsmoker

## 2011-10-06 ENCOUNTER — Ambulatory Visit (INDEPENDENT_AMBULATORY_CARE_PROVIDER_SITE_OTHER): Payer: PRIVATE HEALTH INSURANCE | Admitting: Internal Medicine

## 2011-10-06 ENCOUNTER — Encounter: Payer: Self-pay | Admitting: Internal Medicine

## 2011-10-06 DIAGNOSIS — I10 Essential (primary) hypertension: Secondary | ICD-10-CM

## 2011-10-06 DIAGNOSIS — E785 Hyperlipidemia, unspecified: Secondary | ICD-10-CM

## 2011-10-06 DIAGNOSIS — Z905 Acquired absence of kidney: Secondary | ICD-10-CM

## 2011-10-06 DIAGNOSIS — I890 Lymphedema, not elsewhere classified: Secondary | ICD-10-CM | POA: Insufficient documentation

## 2011-10-06 NOTE — Assessment & Plan Note (Addendum)
Patient denies pain.  It's likely that her obesity (BMI ~55) may be causing lymphatic dysfunction to lower extremities.

## 2011-10-06 NOTE — Progress Notes (Signed)
Subjective:     Patient ID: Tina Chase, female   DOB: 05-02-1941, 71 y.o.   MRN: [id number]  HPI Pt presents to clinic for follow up of nephrectomy incision.  No complaints reporting that swelling has resolved for the most part and denies pain.  Would like oxygen canisters removed from her home.  Review of Systems  Constitutional: Negative for fever.  Respiratory: Negative for shortness of breath.   Cardiovascular: Negative for chest pain.  Genitourinary: Negative for dysuria.  Neurological: Negative for weakness.       Objective:   Physical Exam  Constitutional: She appears well-developed and well-nourished. No distress.       morbidly obese  HENT:  Head: Normocephalic and atraumatic.  Eyes: EOM are normal. Pupils are equal, round, and reactive to light.  Neck: Normal range of motion. Neck supple.  Cardiovascular: Normal rate and S1 normal.  Exam reveals distant heart sounds and decreased pulses.   No murmur heard.      Distal pulses difficult to palpate due to body habitus but extremities warm with good capillary refill  Pulmonary/Chest: Effort normal. No respiratory distress.  Abdominal: Soft. Bowel sounds are normal. She exhibits no distension. There is no tenderness.         Large abdominal panus with well healed surgical scar  Musculoskeletal:       Right lower leg: She exhibits edema.       Left lower leg: She exhibits edema.       Bilateral Lower limbs with nonpitting edema c/w  lymphedematous appearance L>R       Assessment:     1. S/p nephrectomy: secondary to chronic nephrolithiasis and infection, no complaints, well healed surgical scar 2. Hypertension: above goal today but had reached on goal on current regimen on prior visit 3. Lower extremity swelling bilaterally: baseline difficult to determine given morbid obesity 4. Hyperlipidemia: LDL at goal without statin therapy 5. H/o hypoxia: s/p surgery now resolved, prescription faxed to [facility] to collect  canisters    Plan:     See problem list

## 2011-10-06 NOTE — Patient Instructions (Addendum)
It was nice to see you today.  You oxygen levels have been good at >90% on room air.  I have written a prescription to discontinue the oxygen. Your surgical incision is healing well. Keep your scheduled appointment with the kidney doctors. Follow-up with your primary care doctor in 3-4 months.

## 2011-10-06 NOTE — Assessment & Plan Note (Signed)
Reviewed Lipid Panel with pt, LDL 76 thus at goal. Advised on methods to increase HDL including diet modification and exercise.

## 2011-10-06 NOTE — Assessment & Plan Note (Addendum)
Flat, Well healed, no dehiscence, no oozing last creatinine 0.7 in Jan 2013.   Pt to f/u with Renal in May per her report.

## 2011-10-06 NOTE — Assessment & Plan Note (Addendum)
Bp above goal today at 156/88 with pulse 94 bpm on daily amlodipine 10 mg, imdur 120 mg, losartan 25 mg, and metoprolol 50 mg bid.  Regimen should be reassessed at follow-up with PCP if not at goal.

## 2011-12-03 ENCOUNTER — Encounter (HOSPITAL_COMMUNITY): Payer: Self-pay | Admitting: Emergency Medicine

## 2011-12-03 ENCOUNTER — Emergency Department (HOSPITAL_COMMUNITY): Payer: PRIVATE HEALTH INSURANCE

## 2011-12-03 ENCOUNTER — Inpatient Hospital Stay (HOSPITAL_COMMUNITY)
Admission: EM | Admit: 2011-12-03 | Discharge: 2011-12-23 | DRG: 329 | Disposition: A | Discharge status: Skilled Nursing Facility | Payer: PRIVATE HEALTH INSURANCE | Attending: General Surgery | Admitting: General Surgery

## 2011-12-03 DIAGNOSIS — Z905 Acquired absence of kidney: Secondary | ICD-10-CM

## 2011-12-03 DIAGNOSIS — E785 Hyperlipidemia, unspecified: Secondary | ICD-10-CM | POA: Diagnosis present

## 2011-12-03 DIAGNOSIS — R197 Diarrhea, unspecified: Secondary | ICD-10-CM

## 2011-12-03 DIAGNOSIS — E213 Hyperparathyroidism, unspecified: Secondary | ICD-10-CM | POA: Diagnosis present

## 2011-12-03 DIAGNOSIS — K439 Ventral hernia without obstruction or gangrene: Secondary | ICD-10-CM

## 2011-12-03 DIAGNOSIS — K56609 Unspecified intestinal obstruction, unspecified as to partial versus complete obstruction: Secondary | ICD-10-CM | POA: Diagnosis present

## 2011-12-03 DIAGNOSIS — K430 Incisional hernia with obstruction, without gangrene: Secondary | ICD-10-CM

## 2011-12-03 DIAGNOSIS — K565 Intestinal adhesions [bands], unspecified as to partial versus complete obstruction: Secondary | ICD-10-CM

## 2011-12-03 DIAGNOSIS — J95821 Acute postprocedural respiratory failure: Secondary | ICD-10-CM | POA: Diagnosis not present

## 2011-12-03 DIAGNOSIS — D72829 Elevated white blood cell count, unspecified: Secondary | ICD-10-CM | POA: Diagnosis present

## 2011-12-03 DIAGNOSIS — Z6841 Body Mass Index (BMI) 40.0 and over, adult: Secondary | ICD-10-CM

## 2011-12-03 DIAGNOSIS — I9589 Other hypotension: Secondary | ICD-10-CM | POA: Diagnosis not present

## 2011-12-03 DIAGNOSIS — Y838 Other surgical procedures as the cause of abnormal reaction of the patient, or of later complication, without mention of misadventure at the time of the procedure: Secondary | ICD-10-CM | POA: Diagnosis not present

## 2011-12-03 DIAGNOSIS — I10 Essential (primary) hypertension: Secondary | ICD-10-CM | POA: Diagnosis present

## 2011-12-03 DIAGNOSIS — R000 Tachycardia, unspecified: Secondary | ICD-10-CM | POA: Diagnosis present

## 2011-12-03 DIAGNOSIS — E876 Hypokalemia: Secondary | ICD-10-CM | POA: Diagnosis not present

## 2011-12-03 DIAGNOSIS — I2699 Other pulmonary embolism without acute cor pulmonale: Secondary | ICD-10-CM | POA: Diagnosis not present

## 2011-12-03 DIAGNOSIS — J45909 Unspecified asthma, uncomplicated: Secondary | ICD-10-CM | POA: Diagnosis present

## 2011-12-03 DIAGNOSIS — I5033 Acute on chronic diastolic (congestive) heart failure: Secondary | ICD-10-CM | POA: Diagnosis not present

## 2011-12-03 DIAGNOSIS — D649 Anemia, unspecified: Secondary | ICD-10-CM

## 2011-12-03 DIAGNOSIS — I509 Heart failure, unspecified: Secondary | ICD-10-CM | POA: Diagnosis present

## 2011-12-03 DIAGNOSIS — T8189XA Other complications of procedures, not elsewhere classified, initial encounter: Secondary | ICD-10-CM | POA: Diagnosis not present

## 2011-12-03 DIAGNOSIS — S31109A Unspecified open wound of abdominal wall, unspecified quadrant without penetration into peritoneal cavity, initial encounter: Secondary | ICD-10-CM

## 2011-12-03 DIAGNOSIS — I959 Hypotension, unspecified: Secondary | ICD-10-CM | POA: Diagnosis not present

## 2011-12-03 LAB — CBC
MCH: 29.9 pg (ref 26.0–34.0)
MCHC: 33.5 g/dL (ref 30.0–36.0)
Platelets: 296 10*3/uL (ref 150–400)
RDW: 14.4 % (ref 11.5–15.5)

## 2011-12-03 LAB — URINE MICROSCOPIC-ADD ON

## 2011-12-03 LAB — URINALYSIS, ROUTINE W REFLEX MICROSCOPIC
Nitrite: NEGATIVE
Specific Gravity, Urine: >1.046 — ABNORMAL HIGH (ref 1.005–1.030)
Urobilinogen, UA: 0.2 mg/dL (ref 0.0–1.0)

## 2011-12-03 LAB — DIFFERENTIAL
Basophils Relative: 0 % (ref 0–1)
Eosinophils Absolute: 0 10*3/uL (ref 0.0–0.7)
Monocytes Relative: 8 % (ref 3–12)
Neutrophils Relative %: 72 % (ref 43–77)

## 2011-12-03 LAB — BASIC METABOLIC PANEL
BUN: 11 mg/dL (ref 6–23)
GFR calc Af Amer: >90 mL/min (ref >90)
GFR calc non Af Amer: 84 mL/min — ABNORMAL LOW (ref >90)
Potassium: 3.9 mEq/L (ref 3.5–5.1)
Sodium: 138 mEq/L (ref 135–145)

## 2011-12-03 MED ORDER — DIPHENHYDRAMINE HCL 50 MG/ML IJ SOLN: 12.5000 mg | Freq: Four times a day (QID) | INTRAMUSCULAR | Status: DC | PRN

## 2011-12-03 MED ORDER — ONDANSETRON HCL 4 MG/2ML IJ SOLN
4.0000 mg | Freq: Once | INTRAMUSCULAR | Status: AC
  Administered 2011-12-03: 4 mg via INTRAVENOUS
  Filled 2011-12-03: qty 2

## 2011-12-03 MED ORDER — METOPROLOL TARTRATE 1 MG/ML IV SOLN
5.0000 mg | Freq: Four times a day (QID) | INTRAVENOUS | Status: DC
  Administered 2011-12-03 – 2011-12-04 (×5): 5 mg via INTRAVENOUS
  Filled 2011-12-03 (×8): qty 5

## 2011-12-03 MED ORDER — DIPHENHYDRAMINE HCL 12.5 MG/5ML PO ELIX: 12.5000 mg | ORAL_SOLUTION | Freq: Four times a day (QID) | ORAL | Status: DC | PRN

## 2011-12-03 MED ORDER — ALBUTEROL SULFATE HFA 108 (90 BASE) MCG/ACT IN AERS
1.0000 | INHALATION_SPRAY | RESPIRATORY_TRACT | Status: DC
  Filled 2011-12-03: qty 6.7

## 2011-12-03 MED ORDER — ONDANSETRON HCL 4 MG/2ML IJ SOLN
4.0000 mg | Freq: Four times a day (QID) | INTRAMUSCULAR | Status: DC | PRN
  Administered 2011-12-03 – 2011-12-04 (×3): 4 mg via INTRAVENOUS
  Filled 2011-12-03 (×3): qty 2

## 2011-12-03 MED ORDER — MORPHINE SULFATE 2 MG/ML IJ SOLN
2.0000 mg | INTRAMUSCULAR | Status: DC | PRN
  Administered 2011-12-03 (×2): 2 mg via INTRAVENOUS
  Administered 2011-12-03: 4 mg via INTRAVENOUS
  Administered 2011-12-03: 2 mg via INTRAVENOUS
  Administered 2011-12-04 (×2): 4 mg via INTRAVENOUS
  Filled 2011-12-03 (×3): qty 2
  Filled 2011-12-03 (×3): qty 1

## 2011-12-03 MED ORDER — PANTOPRAZOLE SODIUM 40 MG IV SOLR
40.0000 mg | Freq: Every day | INTRAVENOUS | Status: DC
  Administered 2011-12-03: 40 mg via INTRAVENOUS
  Filled 2011-12-03 (×2): qty 40

## 2011-12-03 MED ORDER — POTASSIUM CHLORIDE IN NACL 20-0.45 MEQ/L-% IV SOLN
INTRAVENOUS | Status: DC
  Administered 2011-12-03 (×2): via INTRAVENOUS
  Administered 2011-12-04: 125 mL/h via INTRAVENOUS
  Filled 2011-12-03 (×7): qty 1000

## 2011-12-03 MED ORDER — MORPHINE SULFATE 4 MG/ML IJ SOLN
4.0000 mg | Freq: Once | INTRAMUSCULAR | Status: AC
  Administered 2011-12-03: 4 mg via INTRAVENOUS
  Filled 2011-12-03: qty 1

## 2011-12-03 MED ORDER — IOHEXOL 300 MG/ML  SOLN
100.0000 mL | Freq: Once | INTRAMUSCULAR | Status: AC | PRN
  Administered 2011-12-03: 100 mL via INTRAVENOUS

## 2011-12-03 MED ORDER — ALBUTEROL SULFATE HFA 108 (90 BASE) MCG/ACT IN AERS
2.0000 | INHALATION_SPRAY | RESPIRATORY_TRACT | Status: DC | PRN
  Filled 2011-12-03: qty 6.7

## 2011-12-03 NOTE — Progress Notes (Signed)
GU  Notified by Dr. Dwain Sarna of patient's admission for bowel obstruction due to hernia. She had a laparoscopic right simple nephrectomy for a non-functioning kidney. She began having nausea earlier this week and reported to the ER this morning. CT scan revealed proximal dilated small bowel and a transition point in her ventral hernia.  She currently has an NG tube in place. She also had a left proximal 3.7 mm ureter stone.  It looks as if she is going to need surgery to reduce the hernia and repair it.  Filed Vitals:   12/03/11 1800  BP: 172/90  Pulse: 86  Temp: 97.3 F (36.3 C)  Resp: 20   Gen: Awake, alert Chest: Equal effort bilaterally Abd: Soft, TTP over hernia  A/P: SBO due to ventral hernia. Left ureter stone. -Appreciate bowel management by Dr. Dwain Sarna. -Will continue to follow patient. No indication for intervention on stone at this time.

## 2011-12-03 NOTE — ED Notes (Signed)
[id number]<AV> Expected date:<BR> Expected time:<BR> Means of arrival:<BR> Comments:<BR> EMS GC, abd pain

## 2011-12-03 NOTE — H&P (Signed)
Tina Chase is an 71 y.o. female.   Chief Complaint: ABD pain, constipation, nausea and vomiting  HPI: Tina Chase is a 71 yo female who underwent a Lap assisted nephrectomy in January of this year for a non functional right kidney with frequent UTI's. She had done well following this. She did note some swelling of her right abd since the time of surgery. Over the weekend she developed worsening abd pain and subsequently developed nausea and vomiting over the past several days. She reports that she has been vomiting day and night and has only been able to get some water down.  She has not had a BM since Saturday, and this was only a small hard BM. She denies fever or chills.  She presented to the ED during the night due to ongoing NV and a CT scan was obtained and shows a high grade SBO with transition point within a large ventral hernia.  We are asked to admit her for this.   Past Medical History  Diagnosis Date  . Hyperlipidemia   . Hypertension   . Hyperparathyroidism   . Obesity   . Hyperglycemia   . Hypercalcemia   . Asthma   . Shortness of breath     wtih exertion   . Chronic kidney disease     right non functioning kidney   . Arthritis     left knee  and left shoulder     Past Surgical History  Procedure Date  . Tubal ligation   . Other surgical history     D and C x 3   . Laparoscopic nephrectomy 08/19/2011    Procedure: LAPAROSCOPIC NEPHRECTOMY;  Surgeon: Milford Cage, MD;  Location: [facility];  Service: Urology;  Laterality: Right;    Family History  Problem Relation Age of Onset  . Stroke Neg Hx   . Cancer Neg Hx   . Hyperlipidemia Mother   . Hypertension Mother   . Heart disease Father     had a heart attack at age 14  . Heart disease Brother     at age 52  . Heart attack Father 78  . Heart attack Brother 50  . Nephrolithiasis Neg Hx    Social History:  reports that she quit smoking about 43 years ago. Her smoking use included Cigarettes. She has  never used smokeless tobacco. She reports that she does not drink alcohol or use illicit drugs.  Allergies:  Allergies  Allergen Reactions  . Hydralazine Anaphylaxis    Swelling tongue.  . Aspirin     REACTION: hives  . Crab (Shellfish Allergy) Hives  . Lisinopril     REACTION: angioedema     (Not in a hospital admission)  Results for orders placed during the hospital encounter of 12/03/11 (from the past 48 hour(s))  LACTIC ACID, PLASMA     Status: Normal   Collection Time   12/03/11  5:33 AM      Component Value Range Comment   Lactic Acid, Venous 1.4  0.5 - 2.2 (mmol/L)   CBC     Status: Abnormal   Collection Time   12/03/11  5:35 AM      Component Value Range Comment   WBC 12.4 (*) 4.0 - 10.5 (K/uL)    RBC 5.25 (*) 3.87 - 5.11 (MIL/uL)    Hemoglobin 15.7 (*) 12.0 - 15.0 (g/dL)    HCT 16.1 (*) 09.6 - 46.0 (%)    MCV 89.1  78.0 -  100.0 (fL)    MCH 29.9  26.0 - 34.0 (pg)    MCHC 33.5  30.0 - 36.0 (g/dL)    RDW 16.1  09.6 - 04.5 (%)    Platelets 296  150 - 400 (K/uL)   DIFFERENTIAL     Status: Abnormal   Collection Time   12/03/11  5:35 AM      Component Value Range Comment   Neutrophils Relative 72  43 - 77 (%)    Neutro Abs 8.9 (*) 1.7 - 7.7 (K/uL)    Lymphocytes Relative 21  12 - 46 (%)    Lymphs Abs 2.6  0.7 - 4.0 (K/uL)    Monocytes Relative 8  3 - 12 (%)    Monocytes Absolute 0.9  0.1 - 1.0 (K/uL)    Eosinophils Relative 0  0 - 5 (%)    Eosinophils Absolute 0.0  0.0 - 0.7 (K/uL)    Basophils Relative 0  0 - 1 (%)    Basophils Absolute 0.0  0.0 - 0.1 (K/uL)   BASIC METABOLIC PANEL     Status: Abnormal   Collection Time   12/03/11  5:35 AM      Component Value Range Comment   Sodium 138  135 - 145 (mEq/L)    Potassium 3.9  3.5 - 5.1 (mEq/L)    Chloride 96  96 - 112 (mEq/L)    CO2 28  19 - 32 (mEq/L)    Glucose, Bld 135 (*) 70 - 99 (mg/dL)    BUN 11  6 - 23 (mg/dL)    Creatinine, Ser 4.09  0.50 - 1.10 (mg/dL)    Calcium 81.1 (*) 8.4 - 10.5 (mg/dL)    GFR calc  non Af Amer 84 (*) >90 (mL/min)    GFR calc Af Amer >90  >90 (mL/min)   URINALYSIS, ROUTINE W REFLEX MICROSCOPIC     Status: Abnormal   Collection Time   12/03/11  8:38 AM      Component Value Range Comment   Color, Urine AMBER (*) YELLOW  BIOCHEMICALS MAY BE AFFECTED BY COLOR   APPearance CLEAR  CLEAR     Specific Gravity, Urine >1.046 (*) 1.005 - 1.030     pH 6.0  5.0 - 8.0     Glucose, UA NEGATIVE  NEGATIVE (mg/dL)    Hgb urine dipstick LARGE (*) NEGATIVE     Bilirubin Urine SMALL (*) NEGATIVE     Ketones, ur 15 (*) NEGATIVE (mg/dL)    Protein, ur >914 (*) NEGATIVE (mg/dL)    Urobilinogen, UA 0.2  0.0 - 1.0 (mg/dL)    Nitrite NEGATIVE  NEGATIVE     Leukocytes, UA NEGATIVE  NEGATIVE    URINE MICROSCOPIC-ADD ON     Status: Abnormal   Collection Time   12/03/11  8:38 AM      Component Value Range Comment   Squamous Epithelial / LPF RARE  RARE     WBC, UA 0-2  <3 (WBC/hpf)    RBC / HPF 11-20  <3 (RBC/hpf)    Bacteria, UA MANY (*) RARE     Casts HYALINE CASTS (*) NEGATIVE  GRANULAR CAST   Urine-Other MUCOUS PRESENT      Ct Abdomen Pelvis W Contrast  12/03/2011  *RADIOLOGY REPORT*  Clinical Data: Abdominal pain and distention.  History of right nephrectomy.  CT ABDOMEN AND PELVIS WITH CONTRAST  Technique:  Multidetector CT imaging of the abdomen and pelvis was performed following the standard protocol during bolus  administration of intravenous contrast.  Contrast: OMNIPAQUE IOHEXOL 300 MG/ML  SOLN  Comparison: 02/17/2011  Findings: No pericardial or pleural effusions.  No focal liver abnormalities.  The gallbladder appears normal.  The spleen is normal. The pancreas is unremarkable.  Both adrenal glands are normal.  Postop change compatible with right nephrectomy.  No specific features are identified to suggest residual or recurrent tumor within the nephrectomy bed.  There is a stone at the left UPJ that measures 3.7 mm.  This does not result in any significant high- grade hydronephrosis.   There is no upper abdominal adenopathy.  There is no pelvic or inguinal adenopathy.  The urinary bladder appears normal.  The uterus and adnexal structures are negative.  Moderate distention of the stomach.  The proximal and mid small bowel loops are abnormally dilated measuring up to 4.3 cm.  There are multiple air-fluid levels identified.  The distal small bowel loops are collapsed.  Lead point for obstruction is within the large ventral abdominal wall hernia, image 54.  The colon is decreased in caliber.  No free air.  No free fluid or abnormal fluid collections identified.  Review of the visualized bony structures is significant for multilevel degenerative disc disease.  IMPRESSION:  1.  Examination is positive for high-grade small bowel obstruction. Transition point is within the large ventral abdominal wall hernia. 2.  Nonobstructing stone is identified at the left UPJ.  Original Report Authenticated By: Rosealee Albee, M.D.    Review of Systems  Constitutional: Positive for fever, chills, weight loss and malaise/fatigue. Negative for diaphoresis.  HENT: Negative.   Eyes: Negative.   Respiratory: Negative.   Cardiovascular: Negative.   Gastrointestinal: Positive for nausea, vomiting, abdominal pain and constipation.  Genitourinary: Negative.   Musculoskeletal: Negative.   Skin: Negative.   Neurological: Negative.  Negative for weakness.  Psychiatric/Behavioral: Negative.     Blood pressure 124/88, pulse 87, temperature 98.2 F (36.8 C), temperature source Oral, resp. rate 16, height 5\' 4"  (1.626 m), weight 145.151 kg (320 lb), last menstrual period 09/23/1986, SpO2 100.00%. Physical Exam  Constitutional: She is oriented to person, place, and time.       This is a morbidly obese elderly female in moderate distress.   HENT:  Head: Normocephalic and atraumatic.  Nose: Nose normal.  Mouth/Throat: Oropharynx is clear and moist.  Eyes: Pupils are equal, round, and reactive to light.    Neck: Normal range of motion. No tracheal deviation present. Thyromegaly present.  Cardiovascular: Regular rhythm, normal heart sounds and intact distal pulses.  Exam reveals no gallop and no friction rub.   No murmur heard.      Slightly tachycardic  Respiratory: Effort normal and breath sounds normal. No respiratory distress. She has no wheezes. She has no rales.  GI: She exhibits distension. There is tenderness. There is guarding. There is no rebound.       Morbidly obese.  Abd is quiet.  I cannot clearly feel the hernia at this time.   Genitourinary:       deferred  Musculoskeletal:       Moves all extremities. She reports L/E edema is much improved following nephrectomy.   Neurological: She is alert and oriented to person, place, and time.  Skin: Skin is warm and dry.  Psychiatric: She has a normal mood and affect. Her behavior is normal. Judgment and thought content normal.     Assessment/Plan SBO with large ventral hernia with transition point within hernia Morbid obesity  Hx right nephrectomy 08/2011 due to non functioning kidney HTN Hyperparathyroidism Hyperlipidemia  PLAN: Admit to [facility] service. NGT and IV hydration. Will discuss with Dr. Dwain Sarna.   Masako Overall,PA-C Pager [phone] General Trauma Pager [phone]

## 2011-12-03 NOTE — ED Provider Notes (Signed)
Ct Abdomen Pelvis W Contrast  12/03/2011  *RADIOLOGY REPORT*  Clinical Data: Abdominal pain and distention.  History of right nephrectomy.  CT ABDOMEN AND PELVIS WITH CONTRAST  Technique:  Multidetector CT imaging of the abdomen and pelvis was performed following the standard protocol during bolus administration of intravenous contrast.  Contrast: OMNIPAQUE IOHEXOL 300 MG/ML  SOLN  Comparison: 02/17/2011  Findings: No pericardial or pleural effusions.  No focal liver abnormalities.  The gallbladder appears normal.  The spleen is normal. The pancreas is unremarkable.  Both adrenal glands are normal.  Postop change compatible with right nephrectomy.  No specific features are identified to suggest residual or recurrent tumor within the nephrectomy bed.  There is a stone at the left UPJ that measures 3.7 mm.  This does not result in any significant high- grade hydronephrosis.  There is no upper abdominal adenopathy.  There is no pelvic or inguinal adenopathy.  The urinary bladder appears normal.  The uterus and adnexal structures are negative.  Moderate distention of the stomach.  The proximal and mid small bowel loops are abnormally dilated measuring up to 4.3 cm.  There are multiple air-fluid levels identified.  The distal small bowel loops are collapsed.  Lead point for obstruction is within the large ventral abdominal wall hernia, image 54.  The colon is decreased in caliber.  No free air.  No free fluid or abnormal fluid collections identified.  Review of the visualized bony structures is significant for multilevel degenerative disc disease.  IMPRESSION:  1.  Examination is positive for high-grade small bowel obstruction. Transition point is within the large ventral abdominal wall hernia. 2.  Nonobstructing stone is identified at the left UPJ.  Original Report Authenticated By: Rosealee Albee, M.D.     Pt with bowel obstruction associated with a ventral hernia.  Pt examined and with her obesity I am  unable any obvious incarcerated ventral hernia.I spoke with General surgery service.  NGT tube has been ordered.   Celene Kras, MD 12/03/11 [phone]

## 2011-12-03 NOTE — ED Notes (Signed)
2nd attempt to call report to floor. Floor rn will call back.

## 2011-12-03 NOTE — ED Notes (Signed)
Pt to CT  Pt alert and oriented x4. Respirations even and unlabored, bilateral symmetrical rise and fall of chest. Skin warm and dry. In no acute distress. Denies needs.   

## 2011-12-03 NOTE — ED Notes (Signed)
  Pt had a kidney removal in Jan 2013 and she was doing good but since Sun she has swelling in the right side of her abd that is painful and noted none in the left She had avery hard BM on Sun but none since then No BS after listening for one min each quad Lungs clear. O n 2nd listen to BS noted left lower bowel sounds

## 2011-12-03 NOTE — ED Notes (Signed)
Report given to breanna, rn on floor

## 2011-12-03 NOTE — ED Notes (Signed)
First attempt to call report on floor. Floor rn will back ed rn back.

## 2011-12-03 NOTE — H&P (Signed)
She has no pain right now.  She does have bowel obstruction and incisional hernia with obstruction in hernia sac.  She is not tender.  I told her I recommended laparotomy with lysis of adhesions and to repair hernia likely with biologic mesh given obstruction and her body habitus.  I would be concerned right now about use of permanent mesh.  She understands that is my recommendation but would like to wait for now.  There is not emergency given her exam etc right now.  Will give her 24 hours, she will also call her family.  If no better tomorrow she knows we need to go to or.

## 2011-12-03 NOTE — ED Provider Notes (Signed)
History     CSN: [id number]  Arrival date & time 12/03/11  0415   First MD Initiated Contact with Patient 12/03/11 0435      Chief Complaint  Patient presents with  . Abdominal Pain    (Consider location/radiation/quality/duration/timing/severity/associated sxs/prior treatment) HPI 71 year old female presents to emergency department complaining of right-sided abdominal pain ongoing since Sunday. Patient reports no bowel movement since Saturday, and only had a small hard bowel movement at that time. Patient denies any fever. She has had nausea and vomiting worsening over the last several days. She is now unable to keep anything down. Patient reports she has had some swelling to her right abdomen since the surgery in January, but over the last several days her abdomen has been getting progressively larger right side greater than left. Per prior records, patient had nonfunctioning right kidney and was removed secondary to repeated infections Past Medical History  Diagnosis Date  . Hyperlipidemia   . Hypertension   . Hyperparathyroidism   . Obesity   . Hyperglycemia   . Hypercalcemia   . Asthma   . Shortness of breath     wtih exertion   . Chronic kidney disease     right non functioning kidney   . Arthritis     left knee  and left shoulder     Past Surgical History  Procedure Date  . Tubal ligation   . Other surgical history     D and C x 3   . Laparoscopic nephrectomy 08/19/2011    Procedure: LAPAROSCOPIC NEPHRECTOMY;  Surgeon: Milford Cage, MD;  Location: [facility];  Service: Urology;  Laterality: Right;    Family History  Problem Relation Age of Onset  . Stroke Neg Hx   . Cancer Neg Hx   . Hyperlipidemia Mother   . Hypertension Mother   . Heart disease Father     had a heart attack at age 58  . Heart disease Brother     at age 59  . Heart attack Father 86  . Heart attack Brother 50  . Nephrolithiasis Neg Hx     History  Substance Use Topics  . Smoking  status: Former Smoker    Types: Cigarettes    Quit date: 08/03/1968  . Smokeless tobacco: Never Used  . Alcohol Use: No    OB History    Grav Para Term Preterm Abortions TAB SAB Ect Mult Living                  Review of Systems  All other systems reviewed and are negative.    Allergies  Hydralazine; Aspirin; Crab; and Lisinopril  Home Medications   Current Outpatient Rx  Name Route Sig Dispense Refill  . AMLODIPINE BESYLATE 10 MG PO TABS Oral Take 10 mg by mouth daily before lunch.     . FUROSEMIDE 40 MG PO TABS Oral Take 1 tablet (40 mg total) by mouth 2 (two) times daily. 62 tablet 11  . ISOSORBIDE MONONITRATE ER 120 MG PO TB24 Oral Take 1 tablet (120 mg total) by mouth daily. 31 tablet 11  . LOSARTAN POTASSIUM 25 MG PO TABS Oral Take 25 mg by mouth daily before lunch.     . METOPROLOL TARTRATE 50 MG PO TABS Oral Take 1 tablet (50 mg total) by mouth 2 (two) times daily. 60 tablet 11  . PROAIR HFA 108 (90 BASE) MCG/ACT IN AERS  USE 2 PUFFS EVERY 4 TO 6 HOURS AS NEEDEDFOR WHEEZING  1 Inhaler 10    PLEASE RESPOND THANKS!  . SENNOSIDES-DOCUSATE SODIUM 8.6-50 MG PO TABS Oral Take 1 tablet by mouth 2 (two) times daily. 60 tablet 0    BP 148/106  Pulse 106  Temp(Src) 98.3 F (36.8 C) (Oral)  Resp 20  Ht 5\' 4"  (1.626 m)  Wt 320 lb (145.151 kg)  BMI 54.93 kg/m2  SpO2 95%  LMP 09/23/1986  Physical Exam  Nursing note and vitals reviewed. Constitutional:       Morbidly obese, uncomfortable appearing  HENT:  Head: Normocephalic and atraumatic.  Cardiovascular: Normal rate, regular rhythm, normal heart sounds and intact distal pulses.  Exam reveals no gallop and no friction rub.   No murmur heard. Pulmonary/Chest: Effort normal and breath sounds normal. No respiratory distress. She has no wheezes. She has no rales. She exhibits no tenderness.  Abdominal: Soft. She exhibits distension. She exhibits no mass. There is tenderness. There is no rebound and no guarding.        Distention noted to the abdomen with swelling over the right side of the abdomen. Surgical scars noted. Decreased bowel sounds with only minimal noted. Patient tender throughout the abdomen worse in right lower quadrant. No rebound or guarding no masses appreciated  Musculoskeletal: She exhibits edema. She exhibits no tenderness.  Skin: Skin is warm and dry. No rash noted. No erythema. No pallor.  Psychiatric: She has a normal mood and affect. Her behavior is normal. Judgment and thought content normal.    ED Course  Procedures (including critical care time)  Labs Reviewed  CBC - Abnormal; Notable for the following:    WBC 12.4 (*)    RBC 5.25 (*)    Hemoglobin 15.7 (*)    HCT 46.8 (*)    All other components within normal limits  DIFFERENTIAL - Abnormal; Notable for the following:    Neutro Abs 8.9 (*)    All other components within normal limits  BASIC METABOLIC PANEL - Abnormal; Notable for the following:    Glucose, Bld 135 (*)    Calcium 11.5 (*)    GFR calc non Af Amer 84 (*)    All other components within normal limits  LACTIC ACID, PLASMA  URINALYSIS, ROUTINE W REFLEX MICROSCOPIC   No results found.   1. Abdominal pain   2. Nausea & vomiting       MDM  71 year old female who is 5 months status post right nephrectomy with 4 days of worsening abdominal distention nausea and vomiting and no bowel movements. Concern for bowel obstruction. Will obtain baseline labs, CT abdomen pelvis.  8:41 AM Care passed to Dr Lynelle Doctor awaiting CT abd pelvis        Olivia Mackie, MD 12/03/11 [phone]

## 2011-12-04 ENCOUNTER — Encounter (HOSPITAL_COMMUNITY): Payer: Self-pay | Admitting: Anesthesiology

## 2011-12-04 ENCOUNTER — Inpatient Hospital Stay (HOSPITAL_COMMUNITY): Payer: PRIVATE HEALTH INSURANCE

## 2011-12-04 ENCOUNTER — Inpatient Hospital Stay (HOSPITAL_COMMUNITY): Payer: PRIVATE HEALTH INSURANCE | Admitting: Anesthesiology

## 2011-12-04 ENCOUNTER — Encounter (HOSPITAL_COMMUNITY): Admission: EM | Disposition: A | Discharge status: Skilled Nursing Facility | Payer: Self-pay | Source: Home / Self Care

## 2011-12-04 DIAGNOSIS — E213 Hyperparathyroidism, unspecified: Secondary | ICD-10-CM

## 2011-12-04 DIAGNOSIS — I10 Essential (primary) hypertension: Secondary | ICD-10-CM

## 2011-12-04 DIAGNOSIS — K56609 Unspecified intestinal obstruction, unspecified as to partial versus complete obstruction: Secondary | ICD-10-CM | POA: Diagnosis present

## 2011-12-04 DIAGNOSIS — J45909 Unspecified asthma, uncomplicated: Secondary | ICD-10-CM | POA: Diagnosis present

## 2011-12-04 HISTORY — PX: LAPAROTOMY: SHX154

## 2011-12-04 HISTORY — PX: INCISIONAL HERNIA REPAIR: SHX193

## 2011-12-04 HISTORY — PX: APPLICATION OF WOUND VAC: SHX5189

## 2011-12-04 LAB — URINE CULTURE
Colony Count: NO GROWTH
Culture  Setup Time: 201305021438
Culture: NO GROWTH
Special Requests: NORMAL

## 2011-12-04 LAB — BASIC METABOLIC PANEL
CO2: 32 mEq/L (ref 19–32)
Calcium: 10.5 mg/dL (ref 8.4–10.5)
Creatinine, Ser: 0.75 mg/dL (ref 0.50–1.10)
GFR calc Af Amer: >90 mL/min (ref >90)
GFR calc non Af Amer: 84 mL/min — ABNORMAL LOW (ref >90)

## 2011-12-04 LAB — CBC
MCV: 92.9 fL (ref 78.0–100.0)
Platelets: 245 10*3/uL (ref 150–400)
RDW: 14.5 % (ref 11.5–15.5)
WBC: 14 10*3/uL — ABNORMAL HIGH (ref 4.0–10.5)

## 2011-12-04 SURGERY — LAPAROTOMY, EXPLORATORY
Anesthesia: General | Site: Abdomen | Wound class: Dirty or Infected

## 2011-12-04 MED ORDER — SODIUM CHLORIDE 0.9 % IV SOLN
1.0000 g | INTRAVENOUS | Status: DC
  Administered 2011-12-04: 1 g via INTRAVENOUS
  Filled 2011-12-04 (×3): qty 1

## 2011-12-04 MED ORDER — MIDAZOLAM HCL 5 MG/5ML IJ SOLN
INTRAMUSCULAR | Status: DC | PRN
  Administered 2011-12-04: 1 mg via INTRAVENOUS

## 2011-12-04 MED ORDER — LACTATED RINGERS IV SOLN
INTRAVENOUS | Status: DC
  Administered 2011-12-04: 1000 mL via INTRAVENOUS

## 2011-12-04 MED ORDER — 0.9 % SODIUM CHLORIDE (POUR BTL) OPTIME
TOPICAL | Status: DC | PRN
  Administered 2011-12-04: 5000 mL

## 2011-12-04 MED ORDER — MIDAZOLAM HCL 5 MG/ML IJ SOLN
1.0000 mg | INTRAMUSCULAR | Status: DC | PRN
  Administered 2011-12-05: 5 mg via INTRAVENOUS
  Administered 2011-12-05: 2 mg via INTRAVENOUS
  Filled 2011-12-04 (×2): qty 1

## 2011-12-04 MED ORDER — ONDANSETRON HCL 4 MG/2ML IJ SOLN
INTRAMUSCULAR | Status: DC | PRN
  Administered 2011-12-04: 4 mg via INTRAVENOUS

## 2011-12-04 MED ORDER — SODIUM CHLORIDE 0.9 % IV SOLN
INTRAVENOUS | Status: AC
  Filled 2011-12-04: qty 1

## 2011-12-04 MED ORDER — SODIUM CHLORIDE 0.9 % IV SOLN
1.0000 g | INTRAVENOUS | Status: DC
  Administered 2011-12-05 – 2011-12-13 (×9): 1 g via INTRAVENOUS
  Filled 2011-12-04 (×10): qty 1

## 2011-12-04 MED ORDER — PROPOFOL 10 MG/ML IV EMUL
INTRAVENOUS | Status: DC | PRN
  Administered 2011-12-04: 200 mg via INTRAVENOUS

## 2011-12-04 MED ORDER — PANTOPRAZOLE SODIUM 40 MG IV SOLR
40.0000 mg | Freq: Every day | INTRAVENOUS | Status: DC
  Administered 2011-12-05 – 2011-12-10 (×6): 40 mg via INTRAVENOUS
  Filled 2011-12-04 (×7): qty 40

## 2011-12-04 MED ORDER — SUCCINYLCHOLINE CHLORIDE 20 MG/ML IJ SOLN
INTRAMUSCULAR | Status: DC | PRN
  Administered 2011-12-04: 100 mg via INTRAVENOUS

## 2011-12-04 MED ORDER — DEXTROSE IN LACTATED RINGERS 5 % IV SOLN: INTRAVENOUS | Status: DC

## 2011-12-04 MED ORDER — LACTATED RINGERS IV SOLN
INTRAVENOUS | Status: DC | PRN
  Administered 2011-12-04 (×4): via INTRAVENOUS

## 2011-12-04 MED ORDER — ONDANSETRON HCL 4 MG/2ML IJ SOLN
4.0000 mg | Freq: Four times a day (QID) | INTRAMUSCULAR | Status: DC | PRN
  Administered 2011-12-05 – 2011-12-09 (×2): 4 mg via INTRAVENOUS
  Filled 2011-12-04 (×2): qty 2

## 2011-12-04 MED ORDER — FENTANYL CITRATE 0.05 MG/ML IJ SOLN
25.0000 ug | INTRAMUSCULAR | Status: DC | PRN
  Administered 2011-12-05: 50 ug via INTRAVENOUS
  Administered 2011-12-05: 100 ug via INTRAVENOUS
  Administered 2011-12-05 (×2): 50 ug via INTRAVENOUS
  Filled 2011-12-04 (×4): qty 2

## 2011-12-04 MED ORDER — ROCURONIUM BROMIDE 100 MG/10ML IV SOLN
INTRAVENOUS | Status: DC | PRN
  Administered 2011-12-04: 50 mg via INTRAVENOUS

## 2011-12-04 MED ORDER — FENTANYL CITRATE 0.05 MG/ML IJ SOLN: 25.0000 ug | INTRAMUSCULAR | Status: DC | PRN

## 2011-12-04 MED ORDER — ACETAMINOPHEN 10 MG/ML IV SOLN
INTRAVENOUS | Status: AC
  Filled 2011-12-04: qty 100

## 2011-12-04 MED ORDER — LACTATED RINGERS IV SOLN
INTRAVENOUS | Status: DC | PRN
  Administered 2011-12-04: 23:00:00 via INTRAVENOUS

## 2011-12-04 MED ORDER — METOPROLOL TARTRATE 1 MG/ML IV SOLN
INTRAVENOUS | Status: AC
  Filled 2011-12-04: qty 5

## 2011-12-04 MED ORDER — HEPARIN SODIUM (PORCINE) 5000 UNIT/ML IJ SOLN
5000.0000 [IU] | Freq: Three times a day (TID) | INTRAMUSCULAR | Status: DC
  Administered 2011-12-05 – 2011-12-18 (×40): 5000 [IU] via SUBCUTANEOUS
  Filled 2011-12-04 (×43): qty 1

## 2011-12-04 MED ORDER — CISATRACURIUM BESYLATE 2 MG/ML IV SOLN
INTRAVENOUS | Status: DC | PRN
  Administered 2011-12-04: 6 mg via INTRAVENOUS
  Administered 2011-12-04: 4 mg via INTRAVENOUS

## 2011-12-04 MED ORDER — PHENYLEPHRINE HCL 10 MG/ML IJ SOLN
10.0000 mg | INTRAVENOUS | Status: DC | PRN
  Administered 2011-12-04: 25 ug/min via INTRAVENOUS

## 2011-12-04 MED ORDER — ACETAMINOPHEN 10 MG/ML IV SOLN
INTRAVENOUS | Status: DC | PRN
  Administered 2011-12-04: 1000 mg via INTRAVENOUS

## 2011-12-04 MED ORDER — ONDANSETRON HCL 4 MG PO TABS: 4.0000 mg | ORAL_TABLET | Freq: Four times a day (QID) | ORAL | Status: DC | PRN

## 2011-12-04 MED ORDER — EPHEDRINE SULFATE 50 MG/ML IJ SOLN
INTRAMUSCULAR | Status: DC | PRN
  Administered 2011-12-04: 15 mg via INTRAVENOUS

## 2011-12-04 MED ORDER — CISATRACURIUM BESYLATE (PF) 10 MG/5ML IV SOLN
INTRAVENOUS | Status: DC | PRN
  Administered 2011-12-04 (×4): 4 mg via INTRAVENOUS
  Administered 2011-12-04: 10 mg via INTRAVENOUS
  Administered 2011-12-04: 4 mg via INTRAVENOUS
  Administered 2011-12-04: 10 mg via INTRAVENOUS
  Administered 2011-12-04: 6 mg via INTRAVENOUS

## 2011-12-04 MED ORDER — ATROPINE SULFATE 0.4 MG/ML IJ SOLN
INTRAMUSCULAR | Status: DC | PRN
  Administered 2011-12-04: 0.2 mg via INTRAVENOUS
  Administered 2011-12-04: 0.4 mg via INTRAVENOUS

## 2011-12-04 MED ORDER — FENTANYL CITRATE 0.05 MG/ML IJ SOLN
INTRAMUSCULAR | Status: DC | PRN
  Administered 2011-12-04 (×8): 50 ug via INTRAVENOUS
  Administered 2011-12-04: 100 ug via INTRAVENOUS

## 2011-12-04 SURGICAL SUPPLY — 56 items
APPLICATOR COTTON TIP 6IN STRL (MISCELLANEOUS) ×6 IMPLANT
BLADE EXTENDED COATED 6.5IN (ELECTRODE) IMPLANT
BLADE HEX COATED 2.75 (ELECTRODE) ×3 IMPLANT
CANISTER SUCTION 2500CC (MISCELLANEOUS) IMPLANT
CHLORAPREP W/TINT 26ML (MISCELLANEOUS) ×3 IMPLANT
CLOTH BEACON ORANGE TIMEOUT ST (SAFETY) ×3 IMPLANT
COVER MAYO STAND STRL (DRAPES) ×3 IMPLANT
DRAPE LAPAROSCOPIC ABDOMINAL (DRAPES) ×3 IMPLANT
DRAPE WARM FLUID 44X44 (DRAPE) ×3 IMPLANT
DRSG VAC ATS LRG SENSATRAC (GAUZE/BANDAGES/DRESSINGS) ×3 IMPLANT
DRSG VAC ATS MED SENSATRAC (GAUZE/BANDAGES/DRESSINGS) ×3 IMPLANT
ELECT REM PT RETURN 9FT ADLT (ELECTROSURGICAL) ×3
ELECTRODE REM PT RTRN 9FT ADLT (ELECTROSURGICAL) ×2 IMPLANT
GLOVE BIO SURGEON STRL SZ7 (GLOVE) ×6 IMPLANT
GLOVE BIOGEL PI IND STRL 7.0 (GLOVE) ×2 IMPLANT
GLOVE BIOGEL PI IND STRL 7.5 (GLOVE) ×4 IMPLANT
GLOVE BIOGEL PI INDICATOR 7.0 (GLOVE) ×1
GLOVE BIOGEL PI INDICATOR 7.5 (GLOVE) ×2
GLOVE ECLIPSE 7.5 STRL STRAW (GLOVE) ×6 IMPLANT
GLOVE SURG SS PI 6.5 STRL IVOR (GLOVE) ×9 IMPLANT
GLOVE SURG SS PI 8.5 STRL IVOR (GLOVE) ×3
GLOVE SURG SS PI 8.5 STRL STRW (GLOVE) ×6 IMPLANT
GOWN PREVENTION PLUS LG XLONG (DISPOSABLE) ×9 IMPLANT
GOWN PREVENTION PLUS XLARGE (GOWN DISPOSABLE) ×3 IMPLANT
GOWN STRL NON-REIN LRG LVL3 (GOWN DISPOSABLE) ×9 IMPLANT
GOWN STRL REIN XL XLG (GOWN DISPOSABLE) ×6 IMPLANT
KIT BASIN OR (CUSTOM PROCEDURE TRAY) ×3 IMPLANT
LIGASURE IMPACT 36 18CM CVD LR (INSTRUMENTS) ×3 IMPLANT
MANIFOLD NEPTUNE II (INSTRUMENTS) ×3 IMPLANT
NS IRRIG 1000ML POUR BTL (IV SOLUTION) ×9 IMPLANT
PACK GENERAL/GYN (CUSTOM PROCEDURE TRAY) ×3 IMPLANT
RELOAD AUTO 90-3.5 TA90 BLE (ENDOMECHANICALS) ×6 IMPLANT
RELOAD PROXIMATE 100 BLUE (MISCELLANEOUS) ×2 IMPLANT
RELOAD PROXIMATE 100MM BLUE (MISCELLANEOUS) ×2
RELOAD PROXIMATE 75MM BLUE (ENDOMECHANICALS) ×6 IMPLANT
RETAINER VISCERA MED (MISCELLANEOUS) ×3 IMPLANT
SCALPEL HARMONIC ACE (MISCELLANEOUS) IMPLANT
SHEARS FOC LG CVD HARMONIC 17C (MISCELLANEOUS) IMPLANT
SPONGE GAUZE 4X4 12PLY (GAUZE/BANDAGES/DRESSINGS) IMPLANT
SPONGE LAP 18X18 X RAY DECT (DISPOSABLE) ×15 IMPLANT
STAPLER 90 3.5 STAND SLIM (STAPLE) ×6
STAPLER 90 3.5 STD SLIM (STAPLE) ×4 IMPLANT
STAPLER VISISTAT 35W (STAPLE) ×3 IMPLANT
SUCTION POOLE TIP (SUCTIONS) ×3 IMPLANT
SUT NOVA 1 T20/GS 25DT (SUTURE) ×9 IMPLANT
SUT PDS AB 1 CTX 36 (SUTURE) IMPLANT
SUT PDS AB 1 TP1 96 (SUTURE) ×6 IMPLANT
SUT SILK 2 0 (SUTURE) ×3
SUT SILK 2 0 SH CR/8 (SUTURE) ×3 IMPLANT
SUT SILK 2-0 18XBRD TIE 12 (SUTURE) ×2 IMPLANT
SUT SILK 3 0 (SUTURE) ×3
SUT SILK 3 0 SH CR/8 (SUTURE) ×6 IMPLANT
SUT SILK 3-0 18XBRD TIE 12 (SUTURE) ×2 IMPLANT
TOWEL OR 17X26 10 PK STRL BLUE (TOWEL DISPOSABLE) ×3 IMPLANT
TRAY FOLEY CATH 14FRSI W/METER (CATHETERS) ×3 IMPLANT
YANKAUER SUCT BULB TIP NO VENT (SUCTIONS) ×3 IMPLANT

## 2011-12-04 NOTE — Progress Notes (Signed)
Urine collection  in ED.Marland KitchenMarland KitchenPt noted with UA lab results. Bladder scan done on 12/03/11 @ 2230 noted pt  Able to urinate of dark amber, foul smelling urine in BSC.  Bladder scanned Pt again after voiding and noted  In bladder. IVF's infusing without difficulty.  M.D. Order: if pt with less than urine output in 8hour period to call. Pt due to void by  6am on 12/04/11. Noted pt with R kidney removal in Jan. 2013.  NGT continued @ low intermittant suction. Dark green drainage with sediment noted in collection chamber. Will continue to monitor I & O's and follow up with day shift nurse.

## 2011-12-04 NOTE — Progress Notes (Signed)
Subjective: She reports she is feeling better. Less abd pain, but still requiring pain medication at times.  She is not passing any flatus.  of dark green bilious drainage from NGT in canister   ABD films this am with continued SBO with dilatation to as much as 6cm per my read. Radiology read pending.     Objective: Vital signs in last 24 hours: Temp:  [97.3 F (36.3 C)-98.5 F (36.9 C)] 97.9 F (36.6 C) (05/03 0557) Pulse Rate:  [78-106] 78  (05/03 0557) Resp:  [16-20] 20  (05/03 0557) BP: (116-172)/(73-90) 139/82 mmHg (05/03 0557) SpO2:  [90 %-100 %] 97 % (05/03 0557) Last BM Date: 11/28/11  Intake/Output from previous day: 05/02 0701 - 05/03 0700 In: 1497.1 [I.V.:927.1; NG/GT:570] Out: 2333 [Urine:958; Emesis/NG output:475] Intake/Output this shift:    General appearance: alert, cooperative, appears stated age and no distress Resp: clear to auscultation bilaterally Cardio: regular rate and rhythm GI: Abd is quiet, but softer, readily palpable loops of distended bowel now felt in hernia sac,mildly tender to palpation , still cannot palpate the hernia  Lab Results:   Basename 12/04/11 0430 12/03/11 0535  WBC 14.0* 12.4*  HGB 14.5 15.7*  HCT 44.7 46.8*  PLT 245 296   BMET  Basename 12/04/11 0430 12/03/11 0535  NA 140 138  K 3.8 3.9  CL 98 96  CO2 32 28  GLUCOSE 90 135*  BUN 13 11  CREATININE 0.75 0.77  CALCIUM 10.5 11.5*   PT/INR No results found for this basename: LABPROT:2,INR:2 in the last 72 hours ABG No results found for this basename: PHART:2,PCO2:2,PO2:2,HCO3:2 in the last 72 hours  Studies/Results: Ct Abdomen Pelvis W Contrast  12/03/2011  *RADIOLOGY REPORT*  Clinical Data: Abdominal pain and distention.  History of right nephrectomy.  CT ABDOMEN AND PELVIS WITH CONTRAST  Technique:  Multidetector CT imaging of the abdomen and pelvis was performed following the standard protocol during bolus administration of intravenous contrast.   Contrast: OMNIPAQUE IOHEXOL 300 MG/ML  SOLN  Comparison: 02/17/2011  Findings: No pericardial or pleural effusions.  No focal liver abnormalities.  The gallbladder appears normal.  The spleen is normal. The pancreas is unremarkable.  Both adrenal glands are normal.  Postop change compatible with right nephrectomy.  No specific features are identified to suggest residual or recurrent tumor within the nephrectomy bed.  There is a stone at the left UPJ that measures 3.7 mm.  This does not result in any significant high- grade hydronephrosis.  There is no upper abdominal adenopathy.  There is no pelvic or inguinal adenopathy.  The urinary bladder appears normal.  The uterus and adnexal structures are negative.  Moderate distention of the stomach.  The proximal and mid small bowel loops are abnormally dilated measuring up to 4.3 cm.  There are multiple air-fluid levels identified.  The distal small bowel loops are collapsed.  Lead point for obstruction is within the large ventral abdominal wall hernia, image 54.  The colon is decreased in caliber.  No free air.  No free fluid or abnormal fluid collections identified.  Review of the visualized bony structures is significant for multilevel degenerative disc disease.  IMPRESSION:  1.  Examination is positive for high-grade small bowel obstruction. Transition point is within the large ventral abdominal wall hernia. 2.  Nonobstructing stone is identified at the left UPJ.  Original Report Authenticated By: Rosealee Albee, M.D.    Anti-infectives: Anti-infectives    None  Assessment/Plan: Right nephrectomy for non functioning kidney- 08/19/11- Dr Margarita Grizzle  SBO from incarcerated hernia-Will continue NGT drainage. Likely to OR today with worsening SB dilatation   ID- Increased leukocytosis without fever currently , ?UTI although no significant pyuria. Will get peri-op coverage. Await UCx to make sure adequate coverage with hx of frequent  UTI/nephrolithiasis  FEN- Lytes ok, renal function with minimal impairment, follow GFR  L ureter stone- Dr. Margarita Grizzle following , low threshold for removal if obstructs with only 1 kidney  HTN- Will cover with IV Lopressor.  Hyperlipidemia Hyperparathyroidism Morbid obesity Asthma  DISPO- Continue NGT. OR today. Will ask IM to follow with [place] post op as at high risk for complications with multiple chronic issues, asthma, etc.    LOS: 1 day    Elbony Mcclimans,PA-C [phone]

## 2011-12-04 NOTE — Progress Notes (Signed)
She feels somewhat better but no flatus, her exam shows more dilated bowel in hernia,wbc up, films more dilation, she needs to go to or today for ex lap , reduction of hernia, possible bowel resection and repair of hernia.  I don't think I will use permanent mesh due to situation so her risk of recurrence will be high but goal of surgery is to fix obstruction.  Risks include but are not limited to bleeding, infection, injury to bowel, postop abscess, open wound, mi, MI, arrythmias, pna, uti, mech ventilation and recurrence.  She voices understanding and will proceed today.

## 2011-12-04 NOTE — Anesthesia Preprocedure Evaluation (Addendum)
Anesthesia Evaluation  Patient identified by MRN, date of birth, ID band Patient awake    Reviewed: Allergy & Precautions, H&P , NPO status , Patient's Chart, lab work & pertinent test results  History of Anesthesia Complications Negative for: history of anesthetic complications  Airway Mallampati: III TM Distance: <3 FB Neck ROM: Full    Dental No notable dental hx. (+) Teeth Intact and Dental Advisory Given   Pulmonary shortness of breath and with exertion, asthma ,  breath sounds clear to auscultation  Pulmonary exam normal       Cardiovascular hypertension, Pt. on medications and Pt. on home beta blockers Rhythm:Regular Rate:Normal     Neuro/Psych negative neurological ROS  negative psych ROS   GI/Hepatic negative GI ROS, Neg liver ROS,   Endo/Other  Morbid obesity  Renal/GU negative Renal ROSS/P right nephrectomy  negative genitourinary   Musculoskeletal negative musculoskeletal ROS (+)   Abdominal   Peds  Hematology negative hematology ROS (+)   Anesthesia Other Findings   Reproductive/Obstetrics negative OB ROS                           Anesthesia Physical Anesthesia Plan  ASA: III  Anesthesia Plan: General   Post-op Pain Management:    Induction: Intravenous, Rapid sequence and Cricoid pressure planned  Airway Management Planned: Oral ETT  Additional Equipment:   Intra-op Plan:   Post-operative Plan: Extubation in OR  Informed Consent: I have reviewed the patients History and Physical, chart, labs and discussed the procedure including the risks, benefits and alternatives for the proposed anesthesia with the patient or authorized representative who has indicated his/her understanding and acceptance.   Dental advisory given  Plan Discussed with: CRNA  Anesthesia Plan Comments:         Anesthesia Quick Evaluation

## 2011-12-04 NOTE — Anesthesia Postprocedure Evaluation (Deleted)
Anesthesia Post Note  Patient: Tina Chase  Procedure(s) Performed: Procedure(s) (LRB): EXPLORATORY LAPAROTOMY (N/A) HERNIA REPAIR INCISIONAL (N/A) APPLICATION OF WOUND VAC ()  Anesthesia type: General  Patient location: PACU  Post pain: Pain level controlled  Post assessment: Post-op Vital signs reviewed  Last Vitals:  Filed Vitals:   12/04/11 1400  BP: 162/83  Pulse: 67  Temp: 36.3 C  Resp: 18    Post vital signs: Reviewed  Level of consciousness: sedated  Complications: No apparent anesthesia complications

## 2011-12-04 NOTE — Transfer of Care (Signed)
Immediate Anesthesia Transfer of Care Note  Patient: Tina Chase  Procedure(s) Performed: Procedure(s) (LRB): EXPLORATORY LAPAROTOMY (N/A) HERNIA REPAIR INCISIONAL (N/A) APPLICATION OF WOUND VAC ()  Patient Location: ICU  Anesthesia Type: General  Level of Consciousness: sedated  Airway & Oxygen Therapy: Patient connected to T-piece oxygen  Post-op Assessment: Post -op Vital signs reviewed and stable  Post vital signs: stable  Complications: No apparent anesthesia complications

## 2011-12-04 NOTE — Progress Notes (Signed)
Patient seen and examined.  I will be performing the surgery this evening.  She is aware of the proposed procedure and risks and agrees with the surgical plan.

## 2011-12-04 NOTE — Progress Notes (Signed)
Urology Progress Note  Subjective:     No acute urologic events. Decreased abdominal pain. No flatus or BM. No left flank pain.   ROS: Negative: chest pain  Objective:  Patient Vitals for the past 24 hrs:  BP Temp Temp src Pulse Resp SpO2  12/04/11 0557 139/82 mmHg 97.9 F (36.6 C) Oral 78  20  97 %  12/04/11 0210 122/79 mmHg 97.4 F (36.3 C) Oral 80  20  98 %  12/03/11 2146 131/86 mmHg 97.8 F (36.6 C) Oral 83  20  99 %  12/03/11 1800 172/90 mmHg 97.3 F (36.3 C) Oral 86  20  98 %  12/03/11 1400 119/78 mmHg 98.5 F (36.9 C) Oral 80  20  98 %  12/03/11 1300 116/76 mmHg 97.8 F (36.6 C) Oral 83  20  98 %  12/03/11 1205 147/82 mmHg 97.7 F (36.5 C) Oral 91  20  95 %  12/03/11 1122 147/79 mmHg - - 96  - 98 %  12/03/11 1120 - - - 94  - 97 %  12/03/11 1038 - - - - - 95 %  12/03/11 1030 118/73 mmHg - - 106  17  90 %  12/03/11 0732 124/88 mmHg 98.2 F (36.8 C) Oral 87  16  100 %    Physical Exam: General:  No acute distress, awake Cardiovascular:    [ x ]  S1/S2 present, RRR  [  ]  Irregularly irregular Chest:  CTA-B Abdomen:               [ x ] Soft, mildly TTP over hernia  [  ] Soft, NTTP  [  ] Soft, appropriately TTP, incision(s) clean/dry/intact      I/O last 3 completed shifts: In: 1497.1 [I.V.:927.1; NG/GT:570] Out: 2333 [Urine:958; Emesis/NG output:475; Other:900]     Assessment: SBO due to hernia. Left ureter stone.  Plan: -Stone not well visualized on KUB from this morning; remains asymptomatic at this point. -Will have a low threshold for intervention on stone with solitary kidney. -Cr remains stable.   Natalia Leatherwood, MD [phone]

## 2011-12-04 NOTE — Op Note (Signed)
Operative Note  Tina Chase female 71 y.o. 12/04/2011  PREOPERATIVE DX:  Small bowel obstruction secondary to incarcerated ventral incisional hernia  POSTOPERATIVE DX:  Small bowel obstruction secondary to strangulated ventral incisional hernia  PROCEDURE:Exploratory laparotomy, lysis of adhesions (2 hours), small bowel resection (90 cm), primary repair of ventral incisional hernia.         Surgeon: Adolph Pollack   Assistants: Jimmye Norman; Tsuei,Matthew  Anesthesia: General endotracheal anesthesia  Indications: This is a 71 year old female who underwent a laparoscopic-assisted right nephrectomy in January of 2013. She was admitted yesterday with a small bowel obstruction secondary to an incarcerated incisional hernia.  Urgent operation is recommended yesterday but she did not want to have it done at that time. She failed to improve and her white blood cell count was rising and urgent operation was recommended today and she agreed.  The procedure, risks, and severity of the problem had been discussed with her preoperatively.    Procedure Detail:  She was an ill-appearing female brought to the operating room placed supine on the operating table and a general anesthetic was given. A Foley catheter was inserted. The abdominal wall was widely sterilely prepped and draped. A midline incision was made dividing the skin, subcutaneous tissue, fascia, peritoneum. Significantly distended small bowel was noted. There is a right mid abdominal scar present. There was small bowel going up into a large right sided ventral incisional hernia. Some of the bowel appeared ischemic.  The bowel was densely adherent to the hernia sac. Multiple loops of bowel were involved in this process all of which appeared to be mid-ileum. Using sharp and blunt dissection as well as electrocautery a began  dividing adhesions Between the small bowel and the hernia sac as well as the omentum and the hernia sac. Some of the  ischemic bowel was friable and in attempting to reduce it there was an enterotomy and spillage of enteric contents.  I could not reduce all the small bowel loops from the midline incision approach. I subsequently reopened the right mid abdominal transverse scar approaching the hernia from that aspect. I began by mobilizing some of the small bowel and dividing adhesions which were extremely dense between it and the hernia sac. I was able to start to reduce the small bowel and chose a point to divide it as some ischemic bowel obviously had to be resected as well as the area of the enterotomy. Once I did this I was able to mobilize the small bowel more some of which was avulsed away from the hernia sac.  I especially was able to completely reduce the small bowel from the hernia sac to the midline approach and a right transverse incision approach. I ended up resecting 90 cm of mid ileum with the distal point of resection 20 cm proximal to the ileocecal valve.The mesentery was divided with the LigaSure. The specimen was handed off the field.  I then milked back the fluid from the distended small bowel to the stomach and 2 L were evacuated in this fashion. I then performed a side-to-side small bowel anastomosis using the GIA stapler. The common defect was closed with a linear noncutting stapling. The mesenteric defect was closed with interrupted 3-0 silk sutures. A crotch stitch of 3-0 silk was also placed. The anastomosis was patent, viable, and under no tension.  At this point the abdominal cavity was copiously irrigated out with saline solution and no bleeding was noted.  I approached the transverse incision where the  incisional hernia was and I closed the incisional hernia primarily with interrupted #1 Novafil sutures. The closure was solid. I then closed the midline incision with running double looped #1 PDS suture with intermittent #1 Novafil sutures. The subcutaneous tissue of both open wounds was copiously  irrigated.  I then placed VAC sponges in both wounds and initiated nonpressure wound therapy in this fashion.  She had some hemodynamic instability and the operation. She is taken to the intensive care unit intubated and in critical condition. A critical care consultation was requested.   Estimated Blood Loss:  300 mL         Drains: none          Blood Given: none          Specimens: Small bowel        Complications:  * No complications entered in OR log *         Disposition: ICU - intubated and critically ill.         Condition: unstable

## 2011-12-04 NOTE — Consult Note (Signed)
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  Presented to the hospital after developing right abd discomfort over the weekend.  The discomfort has persisted and intensified and patient subsequently developed nausea and vomiting.  CT was performed and showed SBO with transition point within a large ventral hernia.    I have been asked to see the patient for medical management of her other conditions.  Other than her abdominal discomfort patient currently has no other complaints and denies any shortness of breath, palpitations, chest pain, blurred vision, or headaches.  Allergies:   Allergies  Allergen Reactions  . Hydralazine Anaphylaxis    Swelling tongue.  . Aspirin     REACTION: hives  . Crab (Shellfish Allergy) Hives  . Lisinopril     REACTION: angioedema      Past Medical History  Diagnosis Date  . Hyperlipidemia   . Hypertension   . Hyperparathyroidism   . Obesity   . Hyperglycemia   . Hypercalcemia   . Asthma   . Shortness of breath     wtih exertion   . Chronic kidney disease     right non functioning kidney   . Arthritis     left knee  and left shoulder     Past Surgical History  Procedure Date  . Tubal ligation   . Other surgical history     D and C x 3   . Laparoscopic nephrectomy 08/19/2011    Procedure: LAPAROSCOPIC NEPHRECTOMY;  Surgeon: Milford Cage, MD;  Location: [facility];  Service: Urology;  Laterality: Right;    Scheduled Meds:   . ertapenem  1 g Intravenous Q24H  . metoprolol  5 mg Intravenous Q6H  . pantoprazole (PROTONIX) IV  40 mg Intravenous QHS  . DISCONTD: albuterol  1 puff Inhalation  Q4H   Continuous Infusions:   . 0.45 % NaCl with KCl 20 mEq / L 125 mL/hr at 12/04/11 0600   PRN Meds:.albuterol, diphenhydrAMINE, diphenhydrAMINE, morphine, ondansetron  Social History:  reports that she quit smoking about 43 years ago. Her smoking use included Cigarettes. She has never used smokeless tobacco. She reports that she does not drink alcohol or use illicit drugs.  Family History  Problem Relation Age of Onset  . Stroke Neg Hx   . Cancer Neg Hx   . Nephrolithiasis Neg Hx   . Hyperlipidemia Mother   . Hypertension Mother   . Heart disease Father     had a heart attack at age 63  . Heart attack Father 69  . Heart disease Brother     at age 27  . Heart attack Brother 50    Review of Systems:  Constitutional: Denies fever, chills, diaphoresis, + decreased appetite and denies fatigue.  HEENT: Denies photophobia, eye pain, redness, hearing loss, ear pain, congestion, sore throat, rhinorrhea, sneezing, mouth sores, trouble swallowing, neck pain, neck stiffness and tinnitus.   Respiratory: Denies SOB, DOE, cough, chest tightness,  and wheezing.   Cardiovascular: Denies chest pain, palpitations and leg swelling.  Gastrointestinal: + nausea, + vomiting,+ abdominal pain,+ abdominal distention.  Genitourinary: Denies dysuria, urgency, frequency, hematuria, flank pain and difficulty urinating.  Musculoskeletal: Denies myalgias, back pain, joint swelling,  arthralgias and gait problem.  Skin: Denies pallor, rash and wound.  Neurological: Denies dizziness, seizures, syncope, weakness, light-headedness, numbness and headaches.  Hematological: Denies adenopathy. Easy bruising, personal or family bleeding history  Psychiatric/Behavioral: Denies suicidal ideation, mood changes, confusion, nervousness, sleep disturbance and agitation   Physical Exam: Blood pressure 139/82, pulse 78, temperature 97.9 F (36.6 C), temperature source Oral, resp. rate 20, height 5\' 4"  (1.626 m), weight  145.151 kg (320 lb), last menstrual period 09/23/1986, SpO2 97.00%. General: Alert, awake, oriented x3, in no acute distress. HEENT: No bruits, no goiter. Heart: Regular rate and rhythm, without murmurs, rubs, gallops. Lungs: Clear to auscultation bilaterally. Abdomen: absent bowel sounds, distended, ventral hernia, no voluntary or involuntary guarding. Extremities: No clubbing cyanosis or edema with positive pedal pulses. Neuro: Grossly intact, nonfocal.    Labs on Admission:  Results for orders placed during the hospital encounter of 12/03/11 (from the past 48 hour(s))  LACTIC ACID, PLASMA     Status: Normal   Collection Time   12/03/11  5:33 AM      Component Value Range Comment   Lactic Acid, Venous 1.4  0.5 - 2.2 (mmol/L)   CBC     Status: Abnormal   Collection Time   12/03/11  5:35 AM      Component Value Range Comment   WBC 12.4 (*) 4.0 - 10.5 (K/uL)    RBC 5.25 (*) 3.87 - 5.11 (MIL/uL)    Hemoglobin 15.7 (*) 12.0 - 15.0 (g/dL)    HCT 41.3 (*) 24.4 - 46.0 (%)    MCV 89.1  78.0 - 100.0 (fL)    MCH 29.9  26.0 - 34.0 (pg)    MCHC 33.5  30.0 - 36.0 (g/dL)    RDW 01.0  27.2 - 53.6 (%)    Platelets 296  150 - 400 (K/uL)   DIFFERENTIAL     Status: Abnormal   Collection Time   12/03/11  5:35 AM      Component Value Range Comment   Neutrophils Relative 72  43 - 77 (%)    Neutro Abs 8.9 (*) 1.7 - 7.7 (K/uL)    Lymphocytes Relative 21  12 - 46 (%)    Lymphs Abs 2.6  0.7 - 4.0 (K/uL)    Monocytes Relative 8  3 - 12 (%)    Monocytes Absolute 0.9  0.1 - 1.0 (K/uL)    Eosinophils Relative 0  0 - 5 (%)    Eosinophils Absolute 0.0  0.0 - 0.7 (K/uL)    Basophils Relative 0  0 - 1 (%)    Basophils Absolute 0.0  0.0 - 0.1 (K/uL)   BASIC METABOLIC PANEL     Status: Abnormal   Collection Time   12/03/11  5:35 AM      Component Value Range Comment   Sodium 138  135 - 145 (mEq/L)    Potassium 3.9  3.5 - 5.1 (mEq/L)    Chloride 96  96 - 112 (mEq/L)    CO2 28  19 - 32 (mEq/L)    Glucose,  Bld 135 (*) 70 - 99 (mg/dL)    BUN 11  6 - 23 (mg/dL)    Creatinine, Ser 6.44  0.50 - 1.10 (mg/dL)    Calcium 03.4 (*) 8.4 - 10.5 (mg/dL)    GFR calc non Af Amer 84 (*) >90 (mL/min)    GFR calc Af Amer >90  >90 (mL/min)   URINALYSIS, ROUTINE W REFLEX MICROSCOPIC     Status:  Abnormal   Collection Time   12/03/11  8:38 AM      Component Value Range Comment   Color, Urine AMBER (*) YELLOW  BIOCHEMICALS MAY BE AFFECTED BY COLOR   APPearance CLEAR  CLEAR     Specific Gravity, Urine >1.046 (*) 1.005 - 1.030     pH 6.0  5.0 - 8.0     Glucose, UA NEGATIVE  NEGATIVE (mg/dL)    Hgb urine dipstick LARGE (*) NEGATIVE     Bilirubin Urine SMALL (*) NEGATIVE     Ketones, ur 15 (*) NEGATIVE (mg/dL)    Protein, ur >629 (*) NEGATIVE (mg/dL)    Urobilinogen, UA 0.2  0.0 - 1.0 (mg/dL)    Nitrite NEGATIVE  NEGATIVE     Leukocytes, UA NEGATIVE  NEGATIVE    URINE MICROSCOPIC-ADD ON     Status: Abnormal   Collection Time   12/03/11  8:38 AM      Component Value Range Comment   Squamous Epithelial / LPF RARE  RARE     WBC, UA 0-2  <3 (WBC/hpf)    RBC / HPF 11-20  <3 (RBC/hpf)    Bacteria, UA MANY (*) RARE     Casts HYALINE CASTS (*) NEGATIVE  GRANULAR CAST   Urine-Other MUCOUS PRESENT     URINE CULTURE     Status: Normal   Collection Time   12/03/11  8:38 AM      Component Value Range Comment   Specimen Description URINE, CATHETERIZED      Special Requests Normal      Culture  Setup Time [id number]      Colony Count NO GROWTH      Culture NO GROWTH      Report Status 12/04/2011 FINAL     BASIC METABOLIC PANEL     Status: Abnormal   Collection Time   12/04/11  4:30 AM      Component Value Range Comment   Sodium 140  135 - 145 (mEq/L)    Potassium 3.8  3.5 - 5.1 (mEq/L)    Chloride 98  96 - 112 (mEq/L)    CO2 32  19 - 32 (mEq/L)    Glucose, Bld 90  70 - 99 (mg/dL)    BUN 13  6 - 23 (mg/dL)    Creatinine, Ser 2.72  0.50 - 1.10 (mg/dL)    Calcium 53.6  8.4 - 10.5 (mg/dL)    GFR calc non Af Amer 84  (*) >90 (mL/min)    GFR calc Af Amer >90  >90 (mL/min)   CBC     Status: Abnormal   Collection Time   12/04/11  4:30 AM      Component Value Range Comment   WBC 14.0 (*) 4.0 - 10.5 (K/uL)    RBC 4.81  3.87 - 5.11 (MIL/uL)    Hemoglobin 14.5  12.0 - 15.0 (g/dL)    HCT 64.4  03.4 - 74.2 (%)    MCV 92.9  78.0 - 100.0 (fL)    MCH 30.1  26.0 - 34.0 (pg)    MCHC 32.4  30.0 - 36.0 (g/dL)    RDW 59.5  63.8 - 75.6 (%)    Platelets 245  150 - 400 (K/uL)     Radiological Exams on Admission: Ct Abdomen Pelvis W Contrast  12/03/2011  *RADIOLOGY REPORT*  Clinical Data: Abdominal pain and distention.  History of right nephrectomy.  CT ABDOMEN AND PELVIS WITH CONTRAST  Technique:  Multidetector CT imaging of  the abdomen and pelvis was performed following the standard protocol during bolus administration of intravenous contrast.  Contrast: OMNIPAQUE IOHEXOL 300 MG/ML  SOLN  Comparison: 02/17/2011  Findings: No pericardial or pleural effusions.  No focal liver abnormalities.  The gallbladder appears normal.  The spleen is normal. The pancreas is unremarkable.  Both adrenal glands are normal.  Postop change compatible with right nephrectomy.  No specific features are identified to suggest residual or recurrent tumor within the nephrectomy bed.  There is a stone at the left UPJ that measures 3.7 mm.  This does not result in any significant high- grade hydronephrosis.  There is no upper abdominal adenopathy.  There is no pelvic or inguinal adenopathy.  The urinary bladder appears normal.  The uterus and adnexal structures are negative.  Moderate distention of the stomach.  The proximal and mid small bowel loops are abnormally dilated measuring up to 4.3 cm.  There are multiple air-fluid levels identified.  The distal small bowel loops are collapsed.  Lead point for obstruction is within the large ventral abdominal wall hernia, image 54.  The colon is decreased in caliber.  No free air.  No free fluid or abnormal  fluid collections identified.  Review of the visualized bony structures is significant for multilevel degenerative disc disease.  IMPRESSION:  1.  Examination is positive for high-grade small bowel obstruction. Transition point is within the large ventral abdominal wall hernia. 2.  Nonobstructing stone is identified at the left UPJ.  Original Report Authenticated By: Rosealee Albee, M.D.   Dg Abd Portable 2v  12/04/2011  *RADIOLOGY REPORT*  Clinical Data: Small bowel obstruction follow-up.  PORTABLE ABDOMEN - 2 VIEW  Comparison: 12/03/2011 CT.  Findings: Significant small bowel obstructive pattern with gas distended small bowel loops measuring up to 6.2 cm. On the prior CT small bowel loops measure up to 5 cm.  Nasogastric tube is not visualized.  The patient may benefit from nasogastric tube decompression.  IMPRESSION: Significant small bowel obstructive pattern with gas distended small bowel loops measuring up to 6.2 cm. On the prior CT small bowel loops measure up to 5 cm.  Nasogastric tube is not visualized.  The patient may benefit from nasogastric tube decompression.Significant small bowel obstructive pattern with gas distended small bowel loops measuring up to 6.2 cm. On the prior CT small bowel loops measure up to 5 cm.  Nasogastric tube is not visualized.  The patient may benefit from nasogastric tube decompression.  Original Report Authenticated By: Fuller Canada, M.D.    Assessment/Plan Principal Problem:  *Small bowel obstruction Per primary team ([facility]).  Will follow up with their recommendations.  Active Problems: Asthma:  Currently well controlled agree with albuterol PRN cough/wheeze and continue to monitor.   HYPERTENSION:  Currently well controlled off of home medications.  Patient is currently NPO thus should she require medication for elevated blood pressure would add clonidine transdermal.   HYPERLIPIDEMIA:  Patient is not on any lipid lowering medication.  Will need  follow up as outpatient.  Hypercalcemia:  Most likely related to her history of hyperparathyroidism.  Currently calcium level is within normal limits.  May have led to her Right sided kidney failure.    Time Spent on Consultation: >30 minutes  Penny Pia [facility]  [phone] 12/04/2011, 8:54 AM

## 2011-12-04 NOTE — Anesthesia Postprocedure Evaluation (Signed)
  Anesthesia Post-op Note  Patient: Tina Chase  Procedure(s) Performed: Procedure(s) (LRB): EXPLORATORY LAPAROTOMY (N/A) HERNIA REPAIR INCISIONAL (N/A) APPLICATION OF WOUND VAC ()  Patient Location: ICU  Anesthesia Type: General  Level of Consciousness: unresponsive  Airway and Oxygen Therapy: Patient placed on Ventilator (see vital sign flow sheet for setting)  Post-op Pain: moderate  Post-op Assessment: PATIENT'S CARDIOVASCULAR STATUS UNSTABLE and Respiratory Function Stable  Post-op Vital Signs: unstable  Complications: cardiovascular complications

## 2011-12-05 ENCOUNTER — Inpatient Hospital Stay (HOSPITAL_COMMUNITY): Payer: PRIVATE HEALTH INSURANCE

## 2011-12-05 DIAGNOSIS — I959 Hypotension, unspecified: Secondary | ICD-10-CM | POA: Diagnosis not present

## 2011-12-05 DIAGNOSIS — R000 Tachycardia, unspecified: Secondary | ICD-10-CM | POA: Diagnosis not present

## 2011-12-05 DIAGNOSIS — I10 Essential (primary) hypertension: Secondary | ICD-10-CM

## 2011-12-05 DIAGNOSIS — J95821 Acute postprocedural respiratory failure: Secondary | ICD-10-CM | POA: Diagnosis not present

## 2011-12-05 DIAGNOSIS — Z9889 Other specified postprocedural states: Secondary | ICD-10-CM

## 2011-12-05 DIAGNOSIS — E213 Hyperparathyroidism, unspecified: Secondary | ICD-10-CM

## 2011-12-05 DIAGNOSIS — J9600 Acute respiratory failure, unspecified whether with hypoxia or hypercapnia: Secondary | ICD-10-CM

## 2011-12-05 DIAGNOSIS — K56609 Unspecified intestinal obstruction, unspecified as to partial versus complete obstruction: Secondary | ICD-10-CM

## 2011-12-05 LAB — BLOOD GAS, ARTERIAL
Bicarbonate: 28.2 mEq/L — ABNORMAL HIGH (ref 20.0–24.0)
Drawn by: 235321
O2 Saturation: 99.9 %
PEEP: 5 cmH2O
RATE: 14 resp/min
pCO2 arterial: 57.5 mmHg (ref 35.0–45.0)
pH, Arterial: 7.312 — ABNORMAL LOW (ref 7.350–7.400)
pO2, Arterial: 228 mmHg — ABNORMAL HIGH (ref 80.0–100.0)

## 2011-12-05 LAB — CBC
MCH: 29.6 pg (ref 26.0–34.0)
MCHC: 32.8 g/dL (ref 30.0–36.0)
MCV: 90.4 fL (ref 78.0–100.0)
Platelets: 231 10*3/uL (ref 150–400)
RBC: 5.13 MIL/uL — ABNORMAL HIGH (ref 3.87–5.11)
RDW: 14 % (ref 11.5–15.5)

## 2011-12-05 LAB — COMPREHENSIVE METABOLIC PANEL
ALT: 11 U/L (ref 0–35)
AST: 18 U/L (ref 0–37)
Albumin: 2.6 g/dL — ABNORMAL LOW (ref 3.5–5.2)
Alkaline Phosphatase: 79 U/L (ref 39–117)
Chloride: 99 mEq/L (ref 96–112)
Potassium: 3 mEq/L — ABNORMAL LOW (ref 3.5–5.1)
Sodium: 135 mEq/L (ref 135–145)
Total Bilirubin: 0.8 mg/dL (ref 0.3–1.2)
Total Protein: 5.8 g/dL — ABNORMAL LOW (ref 6.0–8.3)

## 2011-12-05 LAB — PROTIME-INR
INR: 1.11 (ref 0.00–1.49)
Prothrombin Time: 14.5 seconds (ref 11.6–15.2)

## 2011-12-05 LAB — DIFFERENTIAL
Basophils Absolute: 0 10*3/uL (ref 0.0–0.1)
Basophils Relative: 0 % (ref 0–1)
Eosinophils Absolute: 0 10*3/uL (ref 0.0–0.7)
Eosinophils Relative: 0 % (ref 0–5)
Neutrophils Relative %: 85 % — ABNORMAL HIGH (ref 43–77)

## 2011-12-05 LAB — CARDIAC PANEL(CRET KIN+CKTOT+MB+TROPI)
CK, MB: 3.1 ng/mL (ref 0.3–4.0)
Total CK: 106 U/L (ref 7–177)
Troponin I: <0.3 ng/mL (ref <0.30)

## 2011-12-05 LAB — GLUCOSE, CAPILLARY
Glucose-Capillary: 118 mg/dL — ABNORMAL HIGH (ref 70–99)
Glucose-Capillary: 82 mg/dL (ref 70–99)
Glucose-Capillary: 90 mg/dL (ref 70–99)

## 2011-12-05 LAB — MAGNESIUM: Magnesium: 1.5 mg/dL (ref 1.5–2.5)

## 2011-12-05 MED ORDER — PHENYLEPHRINE HCL 10 MG/ML IJ SOLN: 30.0000 ug/min | INTRAMUSCULAR | Status: DC

## 2011-12-05 MED ORDER — IPRATROPIUM-ALBUTEROL 18-103 MCG/ACT IN AERO
4.0000 | INHALATION_SPRAY | Freq: Four times a day (QID) | RESPIRATORY_TRACT | Status: DC
  Administered 2011-12-05: 4 via RESPIRATORY_TRACT
  Filled 2011-12-05: qty 14.7

## 2011-12-05 MED ORDER — FENTANYL CITRATE 0.05 MG/ML IJ SOLN
25.0000 ug | INTRAMUSCULAR | Status: DC | PRN
  Administered 2011-12-05 – 2011-12-07 (×19): 50 ug via INTRAVENOUS
  Filled 2011-12-05 (×20): qty 2

## 2011-12-05 MED ORDER — ALBUTEROL SULFATE HFA 108 (90 BASE) MCG/ACT IN AERS
4.0000 | INHALATION_SPRAY | RESPIRATORY_TRACT | Status: DC | PRN
  Filled 2011-12-05: qty 6.7

## 2011-12-05 MED ORDER — LACTATED RINGERS IV BOLUS (SEPSIS)
500.0000 mL | Freq: Once | INTRAVENOUS | Status: AC
  Administered 2011-12-05: 500 mL via INTRAVENOUS

## 2011-12-05 MED ORDER — MAGNESIUM SULFATE 40 MG/ML IJ SOLN
2.0000 g | Freq: Once | INTRAMUSCULAR | Status: AC
  Administered 2011-12-05: 2 g via INTRAVENOUS
  Filled 2011-12-05: qty 50

## 2011-12-05 MED ORDER — BIOTENE DRY MOUTH MT LIQD
15.0000 mL | Freq: Four times a day (QID) | OROMUCOSAL | Status: DC
  Administered 2011-12-05 – 2011-12-09 (×18): 15 mL via OROMUCOSAL

## 2011-12-05 MED ORDER — SODIUM CHLORIDE 0.9 % IV BOLUS (SEPSIS)
250.0000 mL | Freq: Once | INTRAVENOUS | Status: AC
  Administered 2011-12-05: 250 mL via INTRAVENOUS

## 2011-12-05 MED ORDER — CHLORHEXIDINE GLUCONATE 0.12 % MT SOLN
15.0000 mL | Freq: Two times a day (BID) | OROMUCOSAL | Status: DC
  Administered 2011-12-05 – 2011-12-09 (×10): 15 mL via OROMUCOSAL
  Filled 2011-12-05 (×11): qty 15

## 2011-12-05 MED ORDER — FENTANYL CITRATE 0.05 MG/ML IJ SOLN
INTRAMUSCULAR | Status: AC
  Administered 2011-12-05: 100 ug
  Filled 2011-12-05: qty 2

## 2011-12-05 MED ORDER — MIDAZOLAM HCL 5 MG/ML IJ SOLN
INTRAMUSCULAR | Status: AC
  Administered 2011-12-05: 4 mg
  Filled 2011-12-05: qty 1

## 2011-12-05 MED ORDER — SODIUM CHLORIDE 0.9 % IV BOLUS (SEPSIS)
500.0000 mL | Freq: Once | INTRAVENOUS | Status: AC
  Administered 2011-12-05: 500 mL via INTRAVENOUS

## 2011-12-05 MED ORDER — METOPROLOL TARTRATE 1 MG/ML IV SOLN
2.5000 mg | INTRAVENOUS | Status: DC | PRN
  Administered 2011-12-05 – 2011-12-06 (×5): 5 mg via INTRAVENOUS
  Filled 2011-12-05 (×5): qty 5

## 2011-12-05 MED ORDER — KCL IN DEXTROSE-NACL 20-5-0.9 MEQ/L-%-% IV SOLN
INTRAVENOUS | Status: DC
  Administered 2011-12-05 (×2): 175 mL/h via INTRAVENOUS
  Administered 2011-12-05 – 2011-12-06 (×3): via INTRAVENOUS
  Administered 2011-12-07: 75 mL via INTRAVENOUS
  Administered 2011-12-07: 17:00:00 via INTRAVENOUS
  Administered 2011-12-08: 100 mL/h via INTRAVENOUS
  Administered 2011-12-08 (×2): via INTRAVENOUS
  Administered 2011-12-09: 100 mL/h via INTRAVENOUS
  Administered 2011-12-09 – 2011-12-10 (×3): via INTRAVENOUS
  Filled 2011-12-05 (×18): qty 1000

## 2011-12-05 MED ORDER — POTASSIUM CHLORIDE 10 MEQ/50ML IV SOLN
10.0000 meq | INTRAVENOUS | Status: AC
  Administered 2011-12-05 (×4): 10 meq via INTRAVENOUS
  Filled 2011-12-05: qty 200

## 2011-12-05 MED FILL — Cisatracurium Besylate (PF) IV Soln 10 MG/5ML (2 MG/ML): INTRAVENOUS | Qty: 5 | Status: AC

## 2011-12-05 NOTE — Progress Notes (Signed)
PULMONARY/CCM CONSULT NOTE  Requesting MD/Service: [facility] Date of admission: 5/2 Date of consult: 5/3 Reason for consultation: post op vent mgmt  Pt Profile:  Underwent laparotomy 5/3 for SBO due to ventral hernia incarceration. Remained on vent post op. PCCM asked to assist with vent/CCM mgmt   Lines, Tubes, etc: ETT 5/3 >> 5/4 R IJ CVL 5/3 >>  R femoral A-line 5/3 >>   Microbiology: Urine 5/2 >> NEG  Antibiotics:  Ertapenem 5/2 >>    Studies/Events: 5/2 CT abd: high grade SBO, suspected incarcerated ventral hernia 5/3 Laparotomy: Lysis of adhesions, small bowel resection, repair of ventral hernia  Consults:  [facility] - admitting [facility]   Best Practice: DVT: SQ heparin SUP: PPI Nutrition: none Glycemic control: none required Sedation/analgesia: fentanyl     SUBJ:  RASS 0, CAM -ICU negative. Passed SBT. Looks good post extubation    Filed Vitals:   12/05/11 1100 12/05/11 1200 12/05/11 1300 12/05/11 1400  BP:      Pulse:      Temp:  98.9 F (37.2 C)    TempSrc:  Oral    Resp: 27 25 29 31   Height:      Weight:      SpO2: 97% 95%      EXAM:  Gen: NAD, RASS 0, + F/C HEENT: NAD Neck: No JVD Lungs: Clear anteriorly, no wheezes Cardiovascular: tachy, regular, no M Abdomen: obese, nondistended, diminished to absent BS, diffusely mildly tender Ext: warm, no edema Neuro: no focal deficits   DATA:  CXR: no new CXR  BMET    Component Value Date/Time   NA 135 12/05/2011 0150   K 3.0* 12/05/2011 0150   CL 99 12/05/2011 0150   CO2 28 12/05/2011 0150   GLUCOSE 151* 12/05/2011 0150   BUN 14 12/05/2011 0150   CREATININE 0.80 12/05/2011 0150   CREATININE 0.78 09/23/2010 0925   CALCIUM 9.5 12/05/2011 0150   CALCIUM 10.5 06/10/2009 1342   GFRNONAA 74* 12/05/2011 0150   GFRAA 85* 12/05/2011 0150    Lab Results  Component Value Date   WBC 15.2* 12/05/2011   HGB 15.2* 12/05/2011   HCT 46.4* 12/05/2011   MCV 90.4 12/05/2011   PLT 231 12/05/2011      IMPRESSION/PLAN:   Post  operative VDRF - Looks good post extubation. Cont ICU monitoring and routine post-extubation respiratory care  Post operative hypotension - essentially off pressors now, Cognition and Uo OK. Cont to maintain CVP 8-12 and MAP > 60 mmHg  Tachycardia - on chronic beta blockers PTA. Will begin PRN metoprolol to maintain HR < 115/min  Hypokalemia - replete  Post laparotomy Mgmt per [facility] Fentanyl for analgesia   35 min CCM  Billy Fischer, MD;  PCCM service; Mobile [phone]

## 2011-12-05 NOTE — Progress Notes (Signed)
1 Day Post-Op  Subjective: Extubated.  Very sore. A little tachypneic.  Objective: Vital signs in last 24 hours: Temp:  [97.4 F (36.3 C)-97.5 F (36.4 C)] 97.5 F (36.4 C) (05/04 0000) Pulse Rate:  [67-139] 85  (05/04 0700) Resp:  [14-33] 29  (05/04 0700) BP: (93-162)/(43-89) 143/74 mmHg (05/04 0648) SpO2:  [90 %-100 %] 92 % (05/04 0700) Arterial Line BP: (69-140)/(42-76) 134/70 mmHg (05/04 0700) FiO2 (%):  [30 %-100 %] 30 % (05/04 0311) Last BM Date: 11/28/11  Intake/Output from previous day: 05/03 0701 - 05/04 0700 In: 6045 [I.V.:5805; IV Piggyback:150] Out: 2950 [Urine:500; Emesis/NG output:250; Blood:200] Intake/Output this shift:    PE: Abd-soft, VACs on  Lab Results:   Basename 12/05/11 0150 12/04/11 0430  WBC 15.2* 14.0*  HGB 15.2* 14.5  HCT 46.4* 44.7  PLT 231 245   BMET  Basename 12/05/11 0150 12/04/11 0430  NA 135 140  K 3.0* 3.8  CL 99 98  CO2 28 32  GLUCOSE 151* 90  BUN 14 13  CREATININE 0.80 0.75  CALCIUM 9.5 10.5   PT/INR  Basename 12/05/11 0150  LABPROT 14.5  INR 1.11   Comprehensive Metabolic Panel:    Component Value Date/Time   NA 135 12/05/2011 0150   K 3.0* 12/05/2011 0150   CL 99 12/05/2011 0150   CO2 28 12/05/2011 0150   BUN 14 12/05/2011 0150   CREATININE 0.80 12/05/2011 0150   CREATININE 0.78 09/23/2010 0925   GLUCOSE 151* 12/05/2011 0150   CALCIUM 9.5 12/05/2011 0150   CALCIUM 10.5 06/10/2009 1342   AST 18 12/05/2011 0150   ALT 11 12/05/2011 0150   ALKPHOS 79 12/05/2011 0150   BILITOT 0.8 12/05/2011 0150   PROT 5.8* 12/05/2011 0150   ALBUMIN 2.6* 12/05/2011 0150     Studies/Results: Ct Abdomen Pelvis W Contrast  12/03/2011  *RADIOLOGY REPORT*  Clinical Data: Abdominal pain and distention.  History of right nephrectomy.  CT ABDOMEN AND PELVIS WITH CONTRAST  Technique:  Multidetector CT imaging of the abdomen and pelvis was performed following the standard protocol during bolus administration of intravenous contrast.  Contrast: OMNIPAQUE  IOHEXOL 300 MG/ML  SOLN  Comparison: 02/17/2011  Findings: No pericardial or pleural effusions.  No focal liver abnormalities.  The gallbladder appears normal.  The spleen is normal. The pancreas is unremarkable.  Both adrenal glands are normal.  Postop change compatible with right nephrectomy.  No specific features are identified to suggest residual or recurrent tumor within the nephrectomy bed.  There is a stone at the left UPJ that measures 3.7 mm.  This does not result in any significant high- grade hydronephrosis.  There is no upper abdominal adenopathy.  There is no pelvic or inguinal adenopathy.  The urinary bladder appears normal.  The uterus and adnexal structures are negative.  Moderate distention of the stomach.  The proximal and mid small bowel loops are abnormally dilated measuring up to 4.3 cm.  There are multiple air-fluid levels identified.  The distal small bowel loops are collapsed.  Lead point for obstruction is within the large ventral abdominal wall hernia, image 54.  The colon is decreased in caliber.  No free air.  No free fluid or abnormal fluid collections identified.  Review of the visualized bony structures is significant for multilevel degenerative disc disease.  IMPRESSION:  1.  Examination is positive for high-grade small bowel obstruction. Transition point is within the large ventral abdominal wall hernia. 2.  Nonobstructing stone is identified at the  left UPJ.  Original Report Authenticated By: Rosealee Albee, M.D.   Dg Chest Port 1 View  12/05/2011  *RADIOLOGY REPORT*  Clinical Data: Central line placement.  PORTABLE CHEST - 1 VIEW  Comparison: Chest radiograph performed 08/19/2011  Findings: The patient's endotracheal tube is seen ending 2 cm above the carina.  A right IJ line is noted ending about the cavoatrial junction.  An enteric tube is seen extending to the body of the stomach.  The lungs remain mildly hypoexpanded.  Vascular congestion and vascular crowding are noted.   Bilateral central airspace opacities could reflect mild interstitial edema.  No definite pleural effusion or pneumothorax is seen.  The cardiomediastinal silhouette is borderline normal in size.  No acute osseous abnormalities are identified.  IMPRESSION:  1.  Right IJ line noted ending about the cavoatrial junction. 2.  Lungs mildly hypoexpanded; vascular congestion noted, with question of mild interstitial edema.  Original Report Authenticated By: Tonia Ghent, M.D.   Dg Abd Portable 2v  12/04/2011  *RADIOLOGY REPORT*  Clinical Data: Small bowel obstruction follow-up.  PORTABLE ABDOMEN - 2 VIEW  Comparison: 12/03/2011 CT.  Findings: Significant small bowel obstructive pattern with gas distended small bowel loops measuring up to 6.2 cm. On the prior CT small bowel loops measure up to 5 cm.  Nasogastric tube is not visualized.  The patient may benefit from nasogastric tube decompression.  IMPRESSION: Significant small bowel obstructive pattern with gas distended small bowel loops measuring up to 6.2 cm. On the prior CT small bowel loops measure up to 5 cm.  Nasogastric tube is not visualized.  The patient may benefit from nasogastric tube decompression.Significant small bowel obstructive pattern with gas distended small bowel loops measuring up to 6.2 cm. On the prior CT small bowel loops measure up to 5 cm.  Nasogastric tube is not visualized.  The patient may benefit from nasogastric tube decompression.  Original Report Authenticated By: Fuller Canada, M.D.    Anti-infectives: Anti-infectives     Start     Dose/Rate Route Frequency Ordered Stop   12/05/11 1800   ertapenem (INVANZ) 1 g in sodium chloride 0.9 % 50 mL IVPB        1 g 100 mL/hr over 30 Minutes Intravenous Every 24 hours 12/04/11 2335     12/04/11 1000   ertapenem (INVANZ) 1 g in sodium chloride 0.9 % 50 mL IVPB  Status:  Discontinued        1 g 100 mL/hr over 30 Minutes Intravenous Every 24 hours 12/04/11 0806 12/04/11 2224            Assessment Principal Problem:  *Small bowel obstruction due to strangulated incisional hernia s/p expl lap, small bowel resection, primary hernia repair 12/04/11 Active Problems:  HYPERLIPIDEMIA  HYPERTENSION  Asthma  Intraop hypotension and dysrthymias  hypokalemia   LOS: 2 days   Plan:  Continue ng and IV abxs.  VAC change 5/6.  She is at high risk for intraabdominal infection.  Conduct of the operation and findings were explained to her in detail.   Mackinze Criado J 12/05/2011

## 2011-12-05 NOTE — Progress Notes (Signed)
Patient placed on spontaneous breathing trial by MD with PSV 8. MD remained close to bedside for duration of wean. Tolerated SBT well, with spontaneous volumes of about and RR mid 20's. Positive leak. No stridor. Patient extubated to 2L nasal canula. VS within acceptable limits. RN at bedside also. Patient speaks clearly without difficulty and has effective cough at this time.

## 2011-12-05 NOTE — Consult Note (Signed)
Name: Tina Chase MRN: [id number] DOB: 1941-01-02    LOS: 2  PCCM ADMISSION NOTE  Principal Problem:  *Small bowel obstruction Active Problems:  HYPERLIPIDEMIA  HYPERTENSION  Asthma  History of Present Illness: Patient is a 71 y/o AAF with PMHx of HTN, DL, hyperparathyroidism, asthma, morbid obesity who was seen by Dr Dwain Sarna on 12/03/11 due to abdominal pain, concerning for incarcerated hernia but the patient decided to go home and talk to the family before any surgery. Pt came back on 12/04/11 with worsening of abdominal pain, abdominal xrays  Significant small bowel obstructive pattern with gas distended  small bowel loops measuring up to 6.2 cm. On the prior CT on 12/03/11 small bowel loops measure up to 5 cm. Pt was taken to the OR by Dr Abbey Chatters and underwent exploratory laparotomy, lysis of adhesions (2 hours), small bowel resection (90 cm), primary repair of ventral incisional hernia. Pt was sent to the ICU and kept intubated and PCCM service was consulted for further ICU and vent management.    Lines / Drains: RIJ 12/04/11 R A Femoral line  Antibiotics: Ertapenem     Past Medical History  Diagnosis Date  . Hyperlipidemia   . Hypertension   . Hyperparathyroidism   . Obesity   . Hyperglycemia   . Hypercalcemia   . Asthma   . Shortness of breath     wtih exertion   . Chronic kidney disease     right non functioning kidney   . Arthritis     left knee  and left shoulder    Past Surgical History  Procedure Date  . Tubal ligation   . Other surgical history     D and C x 3   . Laparoscopic nephrectomy 08/19/2011    Procedure: LAPAROSCOPIC NEPHRECTOMY;  Surgeon: Tina Cage, MD;  Location: [facility];  Service: Urology;  Laterality: Right;   Prior to Admission medications   Medication Sig Start Date End Date Taking? Authorizing Provider  amLODipine (NORVASC) 10 MG tablet Take 10 mg by mouth daily before lunch.  03/12/11  Yes Dorothea Ogle, MD  furosemide  (LASIX) 40 MG tablet Take 1 tablet (40 mg total) by mouth 2 (two) times daily. 03/12/11  Yes Dorothea Ogle, MD  isosorbide mononitrate (IMDUR) 120 MG 24 hr tablet Take 1 tablet (120 mg total) by mouth daily. 03/12/11  Yes Dorothea Ogle, MD  losartan (COZAAR) 25 MG tablet Take 25 mg by mouth daily before lunch.  03/12/11  Yes Dorothea Ogle, MD  metoprolol (LOPRESSOR) 50 MG tablet Take 1 tablet (50 mg total) by mouth 2 (two) times daily. 03/12/11 03/11/12 Yes Dorothea Ogle, MD  PROAIR HFA 108 [phone] BASE) MCG/ACT inhaler USE 2 PUFFS EVERY 4 TO 6 HOURS AS NEEDEDFOR WHEEZING 07/31/11  Yes Edsel Petrin, DO  senna-docusate (SENOKOT-S) 8.6-50 MG per tablet Take 1 tablet by mouth 2 (two) times daily. 08/21/11 08/20/12 Yes Tina Cage, MD   Allergies Allergies  Allergen Reactions  . Hydralazine Anaphylaxis    Swelling tongue.  . Aspirin     REACTION: hives  . Crab (Shellfish Allergy) Hives  . Lisinopril     REACTION: angioedema    Family History Family History  Problem Relation Age of Onset  . Stroke Neg Hx   . Cancer Neg Hx   . Nephrolithiasis Neg Hx   . Hyperlipidemia Mother   . Hypertension Mother   . Heart disease Father  had a heart attack at age 70  . Heart attack Father 6  . Heart disease Brother     at age 60  . Heart attack Brother 9    Social History  reports that she quit smoking about 43 years ago. Her smoking use included Cigarettes. She has never used smokeless tobacco. She reports that she does not drink alcohol or use illicit drugs.  Review Of Systems  Patient unable to provide  Vital Signs: Temp:  [97.4 F (36.3 C)-97.9 F (36.6 C)] 97.5 F (36.4 C) (05/04 0000) Pulse Rate:  [67-120] 120  (05/03 2325) Resp:  [14-20] 16  (05/04 0100) BP: (117-162)/(43-89) 126/60 mmHg (05/04 0100) SpO2:  [97 %-100 %] 100 % (05/03 2325) FiO2 (%):  [100 %] 100 % (05/03 2325) I/O last 3 completed shifts: In: 2497.1 [I.V.:1927.1; NG/GT:570] Out: 2733 [Urine:1158; Emesis/NG  output:475; Other:900; Blood:200]  Physical Examination: General:  Intubated, mechanically ventilated, no acute distress Neuro:  Sedated HEENT:  PERRL, pink conjunctivae, moist membranes Neck:  Supple, no JVD   Cardiovascular:  Tachycardic rhythm Lungs:  Decreased breath sounds at the pulm bases, no crackles or wheezes. Abdomen:  Soft, VAC sponges on abdominal wounds, decreased bowel sounds. Musculoskeletal: trace low extrem edema Skin:  No rash  Ventilator settings: Vent Mode:  [-] PRVC FiO2 (%):  [100 %] 100 % Set Rate:  [14 bmp] 14 bmp Vt Set:  [440 mL] 440 mL PEEP:  [5 cmH20] 5 cmH20 Plateau Pressure:  [25 cmH20] 25 cmH20  Labs and Imaging:  Reviewed.  Please refer to the Assessment and Plan section for relevant results.  ASSESSMENT AND PLAN  NEUROLOGIC A:  sedated P: -->  Versed / Fentanyl gtt to goal RASS 0 to -1 -->  Daily WUA  PULMONARY  A:  Acute Resp failure, intubated after abdominal surgery P: -->  Full mechanical support -->  Goal pH>7.30, goal SpO2>92 - ABG 7.312/57/228/28 on PRVC 14/440/5/100% - Will continue PRVC 18/440/5/50% -->  Followup ABG -->  Follow up CXR -->  Bronchodilators: Albuterol, ipratropium -->  Daily SBT  CARDIOVASCULAR  Lab 12/03/11 0533  TROPONINI --  LATICACIDVEN 1.4  PROBNP --   A:  Tachycardia Likely to pain and s/p abdominal surgery P: -->  Initially hypotensive after surgery requiring Neosynephrine but weaned off in the ICU. - A line placed, continuous monitoring  RENAL  Lab 12/04/11 0430 12/03/11 0535  NA 140 138  K 3.8 3.9  CL 98 96  CO2 32 28  BUN 13 11  CREATININE 0.75 0.77  CALCIUM 10.5 11.5*  MG -- --  PHOS -- --   A: Hx of Hypercalcemia. P: - Will get CMP with albumin. -->  Follow BMP, Mg, Phos in AM   GASTROINTESTINAL  A:  S/p laparotomy with bowel resection due to SBO P: -->  Surgery on the case - NG tube to suction, follow up abdominal exam - F/U CBC, WBC, abdominal X rays if  needed.    INFECTIOUS  Lab 12/04/11 0430 12/03/11 0535  WBC 14.0* 12.4*  PROCALCITON -- --    A:  Leukocytosis due to SBO P: - On Ertapenem -->  CBC in AM  ENDOCRINE  Lab 12/04/11 2359  GLUCAP 82   A:  Hx of DM P: --> ICU hyperglycemia protocol  BEST PRACTICE / DISPOSITION -->  ICU status under PCCM -->  Full code -->  NPO -->  Heparin [place] for DVT Px -->  Protonix IV for GI Px -->  Ventilator bundle -->  Family updated at bedside  The patient is critically ill with multiple organ systems failure and requires high complexity decision making for assessment and support, frequent evaluation and titration of therapies, application of advanced monitoring technologies and extensive interpretation of multiple databases. Critical Care Time devoted to patient care services described in this note is 90 minutes.  Juanda Chance, MD Pulmonary and Critical Care Medicine [facility] Tel: [phone]  12/05/2011, 1:44 AM

## 2011-12-05 NOTE — Progress Notes (Addendum)
Subjective: Patient has no complaints this morning.  States that abdominal discomfort is much more tolerable today than it was yesterday.  Nurse has called me to indicate that patient's heart rate was 120's and that her urine output was 20-30 per hour.  Today has been given reportedly a 500cc fluid bolus by surgeon.  Is currently on MIVF's.  Objective: Filed Vitals:   12/05/11 0853 12/05/11 1000 12/05/11 1100 12/05/11 1200  BP:  137/36    Pulse:      Temp:    98.9 F (37.2 C)  TempSrc:    Oral  Resp: 29 27 27 25   Height:      Weight:      SpO2: 93% 100% 97% 95%   Weight change:   Intake/Output Summary (Last 24 hours) at 12/05/11 1324 Last data filed at 12/05/11 1200  Gross per 24 hour  Intake   7147 ml  Output   2950 ml  Net   4197 ml    General: Alert, awake, oriented x3, in no acute distress.  HEENT: No bruits, no goiter.  Heart: Regular rate and rhythm, without murmurs, rubs, gallops.  Lungs: Clear to auscultation, bilateral air movement.  Abdomen: Wound vac in place. No errythema or active bleeding.  Neuro: Grossly intact, nonfocal.   Lab Results:  Basename 12/05/11 0150 12/04/11 0430  NA 135 140  K 3.0* 3.8  CL 99 98  CO2 28 32  GLUCOSE 151* 90  BUN 14 13  CREATININE 0.80 0.75  CALCIUM 9.5 10.5  MG 1.5 --  PHOS 3.4 --    Basename 12/05/11 0150  AST 18  ALT 11  ALKPHOS 79  BILITOT 0.8  PROT 5.8*  ALBUMIN 2.6*   No results found for this basename: LIPASE:2,AMYLASE:2 in the last 72 hours  Basename 12/05/11 0150 12/04/11 0430 12/03/11 0535  WBC 15.2* 14.0* --  NEUTROABS 12.9* -- 8.9*  HGB 15.2* 14.5 --  HCT 46.4* 44.7 --  MCV 90.4 92.9 --  PLT 231 245 --    Basename 12/05/11 0150  CKTOTAL 106  CKMB 3.1  CKMBINDEX --  TROPONINI <0.30   No components found with this basename: POCBNP:3 No results found for this basename: DDIMER:2 in the last 72 hours No results found for this basename: HGBA1C:2 in the last 72 hours No results found for this  basename: CHOL:2,HDL:2,LDLCALC:2,TRIG:2,CHOLHDL:2,LDLDIRECT:2 in the last 72 hours No results found for this basename: TSH,T4TOTAL,FREET3,T3FREE,THYROIDAB in the last 72 hours No results found for this basename: VITAMINB12:2,FOLATE:2,FERRITIN:2,TIBC:2,IRON:2,RETICCTPCT:2 in the last 72 hours  Micro Results: Recent Results (from the past 240 hour(s))  URINE CULTURE     Status: Normal   Collection Time   12/03/11  8:38 AM      Component Value Range Status Comment   Specimen Description URINE, CATHETERIZED   Final    Special Requests Normal   Final    Culture  Setup Time [id number]   Final    Colony Count NO GROWTH   Final    Culture NO GROWTH   Final    Report Status 12/04/2011 FINAL   Final   SURGICAL PCR SCREEN     Status: Abnormal   Collection Time   12/04/11 10:05 AM      Component Value Range Status Comment   MRSA, PCR INVALID RESULTS, SPECIMEN SENT FOR CULTURE (*) NEGATIVE  Final Vanessa Kick RN AT 1238 ON [id number] BY SHUEA   Staphylococcus aureus INVALID RESULTS, SPECIMEN SENT FOR CULTURE (*) NEGATIVE  Final  Studies/Results: Dg Chest Port 1 View  12/05/2011  *RADIOLOGY REPORT*  Clinical Data: Central line placement.  PORTABLE CHEST - 1 VIEW  Comparison: Chest radiograph performed 08/19/2011  Findings: The patient's endotracheal tube is seen ending 2 cm above the carina.  A right IJ line is noted ending about the cavoatrial junction.  An enteric tube is seen extending to the body of the stomach.  The lungs remain mildly hypoexpanded.  Vascular congestion and vascular crowding are noted.  Bilateral central airspace opacities could reflect mild interstitial edema.  No definite pleural effusion or pneumothorax is seen.  The cardiomediastinal silhouette is borderline normal in size.  No acute osseous abnormalities are identified.  IMPRESSION:  1.  Right IJ line noted ending about the cavoatrial junction. 2.  Lungs mildly hypoexpanded; vascular congestion noted, with question of mild  interstitial edema.  Original Report Authenticated By: Tonia Ghent, M.D.   Dg Abd Portable 2v  12/04/2011  *RADIOLOGY REPORT*  Clinical Data: Small bowel obstruction follow-up.  PORTABLE ABDOMEN - 2 VIEW  Comparison: 12/03/2011 CT.  Findings: Significant small bowel obstructive pattern with gas distended small bowel loops measuring up to 6.2 cm. On the prior CT small bowel loops measure up to 5 cm.  Nasogastric tube is not visualized.  The patient may benefit from nasogastric tube decompression.  IMPRESSION: Significant small bowel obstructive pattern with gas distended small bowel loops measuring up to 6.2 cm. On the prior CT small bowel loops measure up to 5 cm.  Nasogastric tube is not visualized.  The patient may benefit from nasogastric tube decompression.Significant small bowel obstructive pattern with gas distended small bowel loops measuring up to 6.2 cm. On the prior CT small bowel loops measure up to 5 cm.  Nasogastric tube is not visualized.  The patient may benefit from nasogastric tube decompression.  Original Report Authenticated By: Fuller Canada, M.D.    Medications: I have reviewed the patient's current medications.   Patient Active Hospital Problem List: Small bowel obstruction due to strangulated incisional hernia s/p expl lap, small bowel resection, primary hernia repair 12/04/11 (12/04/2011) -Per primary (surgical team)  Asthma: Currently well controlled agree with albuterol PRN cough/wheeze and continue to monitor.   HYPERTENSION: Currently well controlled off of home medications. Patient is currently NPO thus should she require medication for elevated blood pressure would add clonidine transdermal.   HYPERLIPIDEMIA: Patient is not on any lipid lowering medication. Will need follow up as outpatient.   Hypercalcemia: Most likely related to her history of hyperparathyroidism. Currently calcium level is within normal limits. May have led to her Right sided kidney  failure.  Tachycardia:  At this point suspect that patient is hypovolemic.  Will plan on giving fluid boluses and placing on MIVF once patient has finished her fluid boluses.  Addendum:  Hypokalemia:  Will plan on replacing today.     LOS: 2 days   Penny Pia M.D.  [facility] 12/05/2011, 1:24 PM

## 2011-12-05 NOTE — Plan of Care (Signed)
Problem: Phase I Progression Outcomes Goal: OOB as tolerated unless otherwise ordered Outcome: Progressing Up to bedside chair today.

## 2011-12-05 NOTE — Procedures (Signed)
Arterial Catheter Insertion Procedure Note Tina Chase [id number] 11/10/1940  Procedure: Insertion of Arterial Catheter  Indications: Blood pressure monitoring and Frequent blood sampling  Procedure Details Consent: Risks of procedure as well as the alternatives and risks of each were explained to the (patient/caregiver).  Consent for procedure obtained. Time Out: Verified patient identification, verified procedure, site/side was marked, verified correct patient position, special equipment/implants available, medications/allergies/relevent history reviewed, required imaging and test results available.  Performed  Maximum sterile technique was used including antiseptics, cap, gloves, gown, hand hygiene, mask and sheet. Skin prep: Chlorhexidine; local anesthetic administered 20 gauge catheter was inserted into right femoral artery using the Seldinger technique.  Evaluation Blood flow good; BP tracing good. Complications: No apparent complications.   Tina Chase 12/05/2011

## 2011-12-05 NOTE — Progress Notes (Signed)
Discussed the operation and her critically ill state with her family.

## 2011-12-06 ENCOUNTER — Inpatient Hospital Stay (HOSPITAL_COMMUNITY): Payer: PRIVATE HEALTH INSURANCE

## 2011-12-06 DIAGNOSIS — R000 Tachycardia, unspecified: Secondary | ICD-10-CM

## 2011-12-06 DIAGNOSIS — I959 Hypotension, unspecified: Secondary | ICD-10-CM

## 2011-12-06 DIAGNOSIS — K56609 Unspecified intestinal obstruction, unspecified as to partial versus complete obstruction: Secondary | ICD-10-CM

## 2011-12-06 DIAGNOSIS — E213 Hyperparathyroidism, unspecified: Secondary | ICD-10-CM

## 2011-12-06 DIAGNOSIS — J95821 Acute postprocedural respiratory failure: Secondary | ICD-10-CM

## 2011-12-06 DIAGNOSIS — I10 Essential (primary) hypertension: Secondary | ICD-10-CM

## 2011-12-06 LAB — MRSA CULTURE

## 2011-12-06 LAB — CBC
MCH: 29.7 pg (ref 26.0–34.0)
MCHC: 32.3 g/dL (ref 30.0–36.0)
Platelets: 162 10*3/uL (ref 150–400)
RBC: 4.11 MIL/uL (ref 3.87–5.11)

## 2011-12-06 LAB — GLUCOSE, CAPILLARY: Glucose-Capillary: 88 mg/dL (ref 70–99)

## 2011-12-06 LAB — BASIC METABOLIC PANEL
BUN: 15 mg/dL (ref 6–23)
Calcium: 9.2 mg/dL (ref 8.4–10.5)
GFR calc Af Amer: 73 mL/min — ABNORMAL LOW (ref >90)
GFR calc non Af Amer: 63 mL/min — ABNORMAL LOW (ref >90)
Glucose, Bld: 139 mg/dL — ABNORMAL HIGH (ref 70–99)
Sodium: 139 mEq/L (ref 135–145)

## 2011-12-06 MED ORDER — METOPROLOL TARTRATE 1 MG/ML IV SOLN
2.5000 mg | INTRAVENOUS | Status: DC | PRN
  Administered 2011-12-06 – 2011-12-09 (×6): 5 mg via INTRAVENOUS
  Filled 2011-12-06 (×6): qty 5

## 2011-12-06 MED ORDER — LEVALBUTEROL HCL 0.63 MG/3ML IN NEBU
0.6300 mg | INHALATION_SOLUTION | Freq: Four times a day (QID) | RESPIRATORY_TRACT | Status: DC | PRN
  Administered 2011-12-09 – 2011-12-10 (×2): 0.63 mg via RESPIRATORY_TRACT
  Filled 2011-12-06 (×2): qty 3

## 2011-12-06 NOTE — Progress Notes (Signed)
PULMONARY/CCM CONSULT NOTE  Requesting MD/Service: [facility] Date of admission: 5/2 Date of consult: 5/3 Reason for consultation: post op vent mgmt  Pt Profile:  Underwent laparotomy 5/3 for SBO due to ventral hernia incarceration. Remained on vent post op. PCCM asked to assist with vent/CCM mgmt   Lines, Tubes, etc: ETT 5/3 >> 5/4 R IJ CVL 5/3 >>  R femoral A-line 5/3 >> 5/5  Microbiology: Urine 5/2 >> NEG  Antibiotics:  Ertapenem 5/2 >>    Studies/Events: 5/2 CT abd: high grade SBO, suspected incarcerated ventral hernia 5/3 Laparotomy: Lysis of adhesions, small bowel resection, repair of ventral hernia  Consults:  [facility] - admitting [facility]   Best Practice: DVT: SQ heparin SUP: PPI Nutrition: none Glycemic control: none required Sedation/analgesia: fentanyl     SUBJ:  Has tolerated extubation well. No new complaints. Still with mild to mod abd pain. Receiving hourly fentanyl    Filed Vitals:   12/06/11 1200 12/06/11 1300 12/06/11 1400 12/06/11 1500  BP:      Pulse:  111 125 126  Temp: 99.5 F (37.5 C)     TempSrc: Oral     Resp: 32 29 31 34  Height:      Weight:      SpO2: 95% 90% 95% 99%    EXAM:  Gen: NAD, RASS 0, + F/C HEENT: NAD Neck: No JVD Lungs: Clear anteriorly, no wheezes Cardiovascular: tachy, regular, no M Abdomen: obese, nondistended, diminished to absent BS, diffusely mildly tender Ext: warm, no edema Neuro: no focal deficits   DATA:  CXR: NAD  BMET    Component Value Date/Time   NA 139 12/06/2011 0430   K 4.6 12/06/2011 0430   CL 107 12/06/2011 0430   CO2 28 12/06/2011 0430   GLUCOSE 139* 12/06/2011 0430   BUN 15 12/06/2011 0430   CREATININE 0.91 12/06/2011 0430   CREATININE 0.78 09/23/2010 0925   CALCIUM 9.2 12/06/2011 0430   CALCIUM 10.5 06/10/2009 1342   GFRNONAA 63* 12/06/2011 0430   GFRAA 73* 12/06/2011 0430    Lab Results  Component Value Date   WBC 18.7* 12/06/2011   HGB 12.3 12/06/2011   HCT 37.8 12/06/2011   MCV 92.0 12/06/2011   PLT 162 12/06/2011     IMPRESSION/PLAN:   Post operative VDRF - Resolved  Post operative hypotension - resolved  Tachycardia - on chronic beta blockers PTA. Cont PRN metoprolol to maintain HR < 115/min  Hypokalemia - repleted and resolved  Post laparotomy Mgmt per [facility] Cont Fentanyl for analgesia  PCCM will sign off. I have changed her status to SDU level. Please call if we can be of further assistance  Billy Fischer, MD;  PCCM service; Mobile [phone]

## 2011-12-06 NOTE — Progress Notes (Signed)
Subjective: Pt mentions that she feels well this morning.  Has had coughing spells reportedly.  States she has a history of asthma.  Heart rate has been tachy and continues to be in the 120'-130's.  No acute issues reported overnight.  Objective: Filed Vitals:   12/06/11 1100 12/06/11 1200 12/06/11 1300 12/06/11 1400  BP:      Pulse:   111 125  Temp:  99.5 F (37.5 C)    TempSrc:  Oral    Resp: 32 32 29 31  Height:      Weight:      SpO2: 91% 95% 90% 95%   Weight change:   Intake/Output Summary (Last 24 hours) at 12/06/11 1429 Last data filed at 12/06/11 1000  Gross per 24 hour  Intake   4040 ml  Output   1110 ml  Net   2930 ml    General: Alert, awake, oriented x3, in no acute distress.  HEENT: No bruits, no goiter.  Heart: Regular rate and rhythm, without murmurs, rubs, gallops.  Lungs: Patient with no wheezes, bilateral air movement.  Abdomen: Soft, wound vac in place Neuro: Grossly intact, nonfocal.   Lab Results:  [facility] 12/06/11 0430 12/05/11 0150  NA 139 135  K 4.6 3.0*  CL 107 99  CO2 28 28  GLUCOSE 139* 151*  BUN 15 14  CREATININE 0.91 0.80  CALCIUM 9.2 9.5  MG -- 1.5  PHOS -- 3.4    Basename 12/05/11 0150  AST 18  ALT 11  ALKPHOS 79  BILITOT 0.8  PROT 5.8*  ALBUMIN 2.6*   No results found for this basename: LIPASE:2,AMYLASE:2 in the last 72 hours  Basename 12/06/11 0430 12/05/11 0150  WBC 18.7* 15.2*  NEUTROABS -- 12.9*  HGB 12.3 15.2*  HCT 37.8 46.4*  MCV 92.0 90.4  PLT 162 231    Basename 12/05/11 0150  CKTOTAL 106  CKMB 3.1  CKMBINDEX --  TROPONINI <0.30   No components found with this basename: POCBNP:3 No results found for this basename: DDIMER:2 in the last 72 hours No results found for this basename: HGBA1C:2 in the last 72 hours No results found for this basename: CHOL:2,HDL:2,LDLCALC:2,TRIG:2,CHOLHDL:2,LDLDIRECT:2 in the last 72 hours No results found for this basename: TSH,T4TOTAL,FREET3,T3FREE,THYROIDAB in the last  72 hours No results found for this basename: VITAMINB12:2,FOLATE:2,FERRITIN:2,TIBC:2,IRON:2,RETICCTPCT:2 in the last 72 hours  Micro Results: Recent Results (from the past 240 hour(s))  URINE CULTURE     Status: Normal   Collection Time   12/03/11  8:38 AM      Component Value Range Status Comment   Specimen Description URINE, CATHETERIZED   Final    Special Requests Normal   Final    Culture  Setup Time [id number]   Final    Colony Count NO GROWTH   Final    Culture NO GROWTH   Final    Report Status 12/04/2011 FINAL   Final   SURGICAL PCR SCREEN     Status: Abnormal   Collection Time   12/04/11 10:05 AM      Component Value Range Status Comment   MRSA, PCR INVALID RESULTS, SPECIMEN SENT FOR CULTURE (*) NEGATIVE  Final INFORMED GCorinne Ports RN AT 1238 ON [id number] BY SHUEA   Staphylococcus aureus INVALID RESULTS, SPECIMEN SENT FOR CULTURE (*) NEGATIVE  Final   MRSA CULTURE     Status: Normal (Preliminary result)   Collection Time   12/04/11 10:05 AM      Component Value Range Status Comment  Specimen Description NOSE   Final    Special Requests NONE   Final    Culture NO SUSPICIOUS COLONIES, CONTINUING TO HOLD   Final    Report Status PENDING   Incomplete     Studies/Results: Dg Chest Port 1 View  12/06/2011  *RADIOLOGY REPORT*  Clinical Data: Respiratory failure  PORTABLE CHEST - 1 VIEW  Comparison: 12/04/2011  Findings: The patient has been extubated.  Nasogastric tube and right IJ central line are stable in position.  Mild cardiomegaly persists.  Mild central pulmonary vascular congestion with some improvement in the mild interstitial edema.  No definite effusion. Patchy bibasilar atelectasis.  IMPRESSION:  1.  Extubation with improvement in mild interstitial edema.  Original Report Authenticated By: Osa Craver, M.D.   Dg Chest Port 1 View  12/05/2011  *RADIOLOGY REPORT*  Clinical Data: Central line placement.  PORTABLE CHEST - 1 VIEW  Comparison: Chest radiograph performed  08/19/2011  Findings: The patient's endotracheal tube is seen ending 2 cm above the carina.  A right IJ line is noted ending about the cavoatrial junction.  An enteric tube is seen extending to the body of the stomach.  The lungs remain mildly hypoexpanded.  Vascular congestion and vascular crowding are noted.  Bilateral central airspace opacities could reflect mild interstitial edema.  No definite pleural effusion or pneumothorax is seen.  The cardiomediastinal silhouette is borderline normal in size.  No acute osseous abnormalities are identified.  IMPRESSION:  1.  Right IJ line noted ending about the cavoatrial junction. 2.  Lungs mildly hypoexpanded; vascular congestion noted, with question of mild interstitial edema.  Original Report Authenticated By: Tonia Ghent, M.D.    Medications: I have reviewed the patient's current medications.   Patient Active Hospital Problem List: Small bowel obstruction due to strangulated incisional hernia s/p expl lap, small bowel resection, primary hernia repair 12/04/11 (12/04/2011)  Patient had to undergo operation and is currently post op being managed by CCM.  Surgery and CCM managing.  Will follow.  HYPERLIPIDEMIA (08/13/2006) Stable currently  Asthma (12/04/2011) Will place order for xopenex prn given elevated heart rate.  Hypotension (12/05/2011) Patient on IVF's.  Reportedly off of pressors.     LOS: 3 days   Penny Pia M.D.  [facility] 12/06/2011, 2:29 PM

## 2011-12-06 NOTE — Progress Notes (Signed)
2 Days Post-Op  Subjective: Not too much pain.  No flatus or BM  Objective: Vital signs in last 24 hours: Temp:  [97.5 F (36.4 C)-98.9 F (37.2 C)] 97.9 F (36.6 C) (05/05 0422) Pulse Rate:  [67-135] 135  (05/05 0400) Resp:  [22-34] 34  (05/05 0700) BP: (137)/(36) 137/36 mmHg (05/04 1000) SpO2:  [92 %-100 %] 98 % (05/05 0500) Arterial Line BP: (91-145)/(51-75) 127/62 mmHg (05/05 0700) Last BM Date: 11/28/11  Intake/Output from previous day: 05/04 0701 - 05/05 0700 In: 5252 [I.V.:3600; NG/GT:60; IV Piggyback:862] Out: 715 [Urine:400; Emesis/NG output:150; Drains:165] Intake/Output this shift:    PE: CV:  Increased rate Abd-soft, quiet, VACs on and draining serous fluid  Lab Results:   Basename 12/06/11 0430 12/05/11 0150  WBC 18.7* 15.2*  HGB 12.3 15.2*  HCT 37.8 46.4*  PLT 162 231   BMET  Basename 12/06/11 0430 12/05/11 0150  NA 139 135  K 4.6 3.0*  CL 107 99  CO2 28 28  GLUCOSE 139* 151*  BUN 15 14  CREATININE 0.91 0.80  CALCIUM 9.2 9.5   PT/INR  Basename 12/05/11 0150  LABPROT 14.5  INR 1.11   Comprehensive Metabolic Panel:    Component Value Date/Time   NA 139 12/06/2011 0430   K 4.6 12/06/2011 0430   CL 107 12/06/2011 0430   CO2 28 12/06/2011 0430   BUN 15 12/06/2011 0430   CREATININE 0.91 12/06/2011 0430   CREATININE 0.78 09/23/2010 0925   GLUCOSE 139* 12/06/2011 0430   CALCIUM 9.2 12/06/2011 0430   CALCIUM 10.5 06/10/2009 1342   AST 18 12/05/2011 0150   ALT 11 12/05/2011 0150   ALKPHOS 79 12/05/2011 0150   BILITOT 0.8 12/05/2011 0150   PROT 5.8* 12/05/2011 0150   ALBUMIN 2.6* 12/05/2011 0150     Studies/Results: Dg Chest Port 1 View  12/05/2011  *RADIOLOGY REPORT*  Clinical Data: Central line placement.  PORTABLE CHEST - 1 VIEW  Comparison: Chest radiograph performed 08/19/2011  Findings: The patient's endotracheal tube is seen ending 2 cm above the carina.  A right IJ line is noted ending about the cavoatrial junction.  An enteric tube is seen extending to the  body of the stomach.  The lungs remain mildly hypoexpanded.  Vascular congestion and vascular crowding are noted.  Bilateral central airspace opacities could reflect mild interstitial edema.  No definite pleural effusion or pneumothorax is seen.  The cardiomediastinal silhouette is borderline normal in size.  No acute osseous abnormalities are identified.  IMPRESSION:  1.  Right IJ line noted ending about the cavoatrial junction. 2.  Lungs mildly hypoexpanded; vascular congestion noted, with question of mild interstitial edema.  Original Report Authenticated By: Tonia Ghent, M.D.    Anti-infectives: Anti-infectives     Start     Dose/Rate Route Frequency Ordered Stop   12/05/11 1800   ertapenem (INVANZ) 1 g in sodium chloride 0.9 % 50 mL IVPB        1 g 100 mL/hr over 30 Minutes Intravenous Every 24 hours 12/04/11 2335     12/04/11 1000   ertapenem (INVANZ) 1 g in sodium chloride 0.9 % 50 mL IVPB  Status:  Discontinued        1 g 100 mL/hr over 30 Minutes Intravenous Every 24 hours 12/04/11 0806 12/04/11 2224          Assessment Principal Problem:  *Small bowel obstruction due to strangulated incisional hernia s/p expl lap, small bowel resection, primary hernia repair 12/04/11 Active  Problems:  HYPERLIPIDEMIA  HYPERTENSION  Asthma  Respiratory failure following trauma and surgery-she is tachypneic  Tachycardia   Sepsis from strangulated hernia with ischemic small bowel    LOS: 3 days   Plan: Needs to remain in ICU.  Could have a prolonged ileus.  Continue IV Invanz and ng.   Brighton Delio J 12/06/2011

## 2011-12-07 DIAGNOSIS — K56609 Unspecified intestinal obstruction, unspecified as to partial versus complete obstruction: Secondary | ICD-10-CM

## 2011-12-07 DIAGNOSIS — I10 Essential (primary) hypertension: Secondary | ICD-10-CM

## 2011-12-07 DIAGNOSIS — E213 Hyperparathyroidism, unspecified: Secondary | ICD-10-CM

## 2011-12-07 LAB — CBC
Platelets: 159 10*3/uL (ref 150–400)
RDW: 14.7 % (ref 11.5–15.5)
WBC: 18.7 10*3/uL — ABNORMAL HIGH (ref 4.0–10.5)

## 2011-12-07 LAB — BASIC METABOLIC PANEL
Calcium: 9.8 mg/dL (ref 8.4–10.5)
Creatinine, Ser: 0.79 mg/dL (ref 0.50–1.10)
GFR calc Af Amer: >90 mL/min (ref >90)

## 2011-12-07 MED ORDER — MORPHINE SULFATE (PF) 1 MG/ML IV SOLN
INTRAVENOUS | Status: DC
  Administered 2011-12-07: 3 mg via INTRAVENOUS
  Administered 2011-12-07: 09:00:00 via INTRAVENOUS
  Administered 2011-12-07: 3 mg via INTRAVENOUS
  Administered 2011-12-08 (×3): 1.5 mg via INTRAVENOUS
  Administered 2011-12-09 (×2): 3 mg via INTRAVENOUS
  Administered 2011-12-09: 1.5 mg via INTRAVENOUS
  Administered 2011-12-09: 25 mg via INTRAVENOUS
  Administered 2011-12-09: 1.5 mg via INTRAVENOUS
  Administered 2011-12-09 – 2011-12-10 (×2): 3 mg via INTRAVENOUS
  Administered 2011-12-10: 1.5 mg via INTRAVENOUS
  Administered 2011-12-10: 3 mg via INTRAVENOUS
  Administered 2011-12-11: 1.5 mg via INTRAVENOUS
  Administered 2011-12-11: 3 mg via INTRAVENOUS
  Administered 2011-12-11: 13:00:00 via INTRAVENOUS
  Filled 2011-12-07 (×3): qty 25

## 2011-12-07 MED ORDER — SODIUM CHLORIDE 0.9 % IJ SOLN: 9.0000 mL | INTRAMUSCULAR | Status: DC | PRN

## 2011-12-07 MED ORDER — ONDANSETRON HCL 4 MG/2ML IJ SOLN: 4.0000 mg | Freq: Four times a day (QID) | INTRAMUSCULAR | Status: DC | PRN

## 2011-12-07 MED ORDER — DIPHENHYDRAMINE HCL 50 MG/ML IJ SOLN: 12.5000 mg | Freq: Four times a day (QID) | INTRAMUSCULAR | Status: DC | PRN

## 2011-12-07 MED ORDER — DIPHENHYDRAMINE HCL 12.5 MG/5ML PO ELIX: 12.5000 mg | ORAL_SOLUTION | Freq: Four times a day (QID) | ORAL | Status: DC | PRN

## 2011-12-07 MED ORDER — NALOXONE HCL 0.4 MG/ML IJ SOLN: 0.4000 mg | INTRAMUSCULAR | Status: DC | PRN

## 2011-12-07 NOTE — Progress Notes (Signed)
GU  Patient in bed. Wound vacs are being assessed. Patient had an extensive surgery requiring resection of small bowel due to ischemia with closure of the hernia defect.  Filed Vitals:   12/07/11 0800  BP:   Pulse:   Temp: 98.1 F (36.7 C)  Resp:    Gen: NAD, awake Abdomen: soft, appropriately TTP GU: Foley in place draining clear urine.  A/P: S/p Hernia repair with resection of ischemic small bowel. -Appreciate Dr. Abbey Chatters and General Surgery team in managing this patient's hernia. -Will continue to follow. -Renal function remains stable. No need for intervention of stone at this time.

## 2011-12-07 NOTE — Consult Note (Signed)
WOC consult Note Reason for Consult:application of V.A.C. Wound type:surgical Pressure Ulcer POA: No Patient not seen today; V.A.C. Placement by ICU RN.  I will follow along with you. Please re-consult if needed in-between visits. Thanks, Ladona Mow, MSN, RN, [facility], CWOCN [phone])

## 2011-12-07 NOTE — Progress Notes (Signed)
Subjective: Patient this morning feels better.  Heart rate reportedly still sinus tachy.  Patient has had metoprolol PRN.  States that abdominal discomfort is tolerable currently.  No acute issues overnight.  Objective: Filed Vitals:   12/07/11 0520 12/07/11 0712 12/07/11 0800 12/07/11 1200  BP:  117/58  119/69  Pulse: 108 115 120   Temp:   98.1 F (36.7 C) 98.5 F (36.9 C)  TempSrc:   Oral Oral  Resp:  29 34 30  Height:      Weight:      SpO2:  100%  100%   Weight change:   Intake/Output Summary (Last 24 hours) at 12/07/11 1409 Last data filed at 12/07/11 1200  Gross per 24 hour  Intake   1730 ml  Output   2050 ml  Net   -320 ml    General: Alert, awake, oriented x3, in no acute distress.  HEENT: No bruits, no goiter.  Heart: Regular rate and rhythm, without murmurs, rubs, gallops.  Lungs: Clear to ausculation, bilateral air movement. No wheezes Abdomen: Soft, wound vac in place.  Neuro: Grossly intact, nonfocal.   Lab Results:  Basename 12/07/11 0500 12/06/11 0430 12/05/11 0150  NA 144 139 --  K 4.4 4.6 --  CL 111 107 --  CO2 29 28 --  GLUCOSE 111* 139* --  BUN 11 15 --  CREATININE 0.79 0.91 --  CALCIUM 9.8 9.2 --  MG -- -- 1.5  PHOS -- -- 3.4    Basename 12/05/11 0150  AST 18  ALT 11  ALKPHOS 79  BILITOT 0.8  PROT 5.8*  ALBUMIN 2.6*   No results found for this basename: LIPASE:2,AMYLASE:2 in the last 72 hours  Basename 12/07/11 0500 12/06/11 0430 12/05/11 0150  WBC 18.7* 18.7* --  NEUTROABS -- -- 12.9*  HGB 10.9* 12.3 --  HCT 34.7* 37.8 --  MCV 93.3 92.0 --  PLT 159 162 --    Basename 12/05/11 0150  CKTOTAL 106  CKMB 3.1  CKMBINDEX --  TROPONINI <0.30   No components found with this basename: POCBNP:3 No results found for this basename: DDIMER:2 in the last 72 hours No results found for this basename: HGBA1C:2 in the last 72 hours No results found for this basename: CHOL:2,HDL:2,LDLCALC:2,TRIG:2,CHOLHDL:2,LDLDIRECT:2 in the last 72  hours No results found for this basename: TSH,T4TOTAL,FREET3,T3FREE,THYROIDAB in the last 72 hours No results found for this basename: VITAMINB12:2,FOLATE:2,FERRITIN:2,TIBC:2,IRON:2,RETICCTPCT:2 in the last 72 hours  Micro Results: Recent Results (from the past 240 hour(s))  URINE CULTURE     Status: Normal   Collection Time   12/03/11  8:38 AM      Component Value Range Status Comment   Specimen Description URINE, CATHETERIZED   Final    Special Requests Normal   Final    Culture  Setup Time [id number]   Final    Colony Count NO GROWTH   Final    Culture NO GROWTH   Final    Report Status 12/04/2011 FINAL   Final   SURGICAL PCR SCREEN     Status: Abnormal   Collection Time   12/04/11 10:05 AM      Component Value Range Status Comment   MRSA, PCR INVALID RESULTS, SPECIMEN SENT FOR CULTURE (*) NEGATIVE  Final Vanessa Kick RN AT 1238 ON [id number] BY SHUEA   Staphylococcus aureus INVALID RESULTS, SPECIMEN SENT FOR CULTURE (*) NEGATIVE  Final   MRSA CULTURE     Status: Normal   Collection Time   12/04/11  10:05 AM      Component Value Range Status Comment   Specimen Description NOSE   Final    Special Requests NONE   Final    Culture     Final    Value: NO STAPHYLOCOCCUS AUREUS ISOLATED     Note: NO MRSA ISOLATED   Report Status 12/06/2011 FINAL   Final     Studies/Results: Dg Chest Port 1 View  12/06/2011  *RADIOLOGY REPORT*  Clinical Data: Respiratory failure  PORTABLE CHEST - 1 VIEW  Comparison: 12/04/2011  Findings: The patient has been extubated.  Nasogastric tube and right IJ central line are stable in position.  Mild cardiomegaly persists.  Mild central pulmonary vascular congestion with some improvement in the mild interstitial edema.  No definite effusion. Patchy bibasilar atelectasis.  IMPRESSION:  1.  Extubation with improvement in mild interstitial edema.  Original Report Authenticated By: Osa Craver, M.D.    Medications: I have reviewed the patient's current  medications.  Patient Active Hospital Problem List: Small bowel obstruction due to strangulated incisional hernia s/p expl lap, small bowel resection, primary hernia repair 12/04/11 (12/04/2011)  Patient had to undergo operation and is currently post op was managed yesterday by PCCM.  Surgery and board and I will follow up on their recommendations.  HYPERLIPIDEMIA (08/13/2006) Stable currently   Asthma (12/04/2011) Will place order for xopenex prn given elevated heart rate.  Currently patient has no wheezes.  Hypotension (12/05/2011) Patient on IVF's. Reportedly off of pressors. At this point will increase IVF's to 150 cc/hr to get closer to maintenance IVF.  Will follow closely patient doesn't have a history of CHF nonetheless will observe and watch for any difficulty breathing.  Disposition:  Pending post op course.  Will follow with general surgery's recommendations.   LOS: 4 days   Penny Pia M.D.  [facility] 12/07/2011, 2:09 PM

## 2011-12-07 NOTE — Progress Notes (Signed)
CARE MANAGEMENT NOTE 12/07/2011  Patient:  Tina Chase, Tina Chase   Account Number:  [id number]  Date Initiated:  12/07/2011  Documentation initiated by:  Jaxan Michel  Subjective/Objective Assessment:   pt was admitted on [id number] for sbo/open exp l;ap with resection and ventral hernia repair on [id number] /required post vent managment until pm of [id number], [id number] ng tube , still having some respiratory issues.  wound vacs     Action/Plan:   lives at home   Anticipated DC Date:  12/10/2011   Anticipated DC Plan:  HOME W HOME HEALTH SERVICES  In-house referral  NA      DC Planning Services  NA      [facility] Choice  NA   Choice offered to / List presented to:  C-1 Patient   DME arranged  NA      DME agency  [facility]     [facility] arranged  NA      [facility] agency  [facility]   Status of service:  In process, will continue to follow Medicare Important Message given?  NO (If response is "NO", the following Medicare IM given date fields will be blank) Date Medicare IM given:   Date Additional Medicare IM given:    Discharge Disposition:    Per UR Regulation:  Reviewed for med. necessity/level of care/duration of stay  If discussed at Long Length of Stay Meetings, dates discussed:    Comments:  [id number]/Jeter Tomey Earlene Plater, RN, BSN, CCM No discharge needs present at time of this review at the sdu/icu level. Case Management [phone]

## 2011-12-07 NOTE — Progress Notes (Addendum)
Patient ID: Tina Chase, female   DOB: 12/11/1940, 71 y.o.   MRN: [id number] Subjective: Awake and alert, comfortable  Objective: Physical Exam: BP 117/58  Pulse 115  Temp(Src) 97.9 F (36.6 C) (Oral)  Resp 29  Ht 5\' 4"  (1.626 m)  Wt 320 lb (145.151 kg)  BMI 54.93 kg/m2  SpO2 100%  LMP 09/23/1986 Lungs clear CV  Tachy Abdomen soft, VAC removed, both wounds clean, rare BS    Labs: CBC  Basename 12/07/11 0500 12/06/11 0430  WBC 18.7* 18.7*  HGB 10.9* 12.3  HCT 34.7* 37.8  PLT 159 162   BMET  Basename 12/07/11 0500 12/06/11 0430  NA 144 139  K 4.4 4.6  CL 111 107  CO2 29 28  GLUCOSE 111* 139*  BUN 11 15  CREATININE 0.79 0.91  CALCIUM 9.8 9.2   LFT  Basename 12/05/11 0150  PROT 5.8*  ALBUMIN 2.6*  AST 18  ALT 11  ALKPHOS 79  BILITOT 0.8  BILIDIR --  IBILI --  LIPASE --   PT/INR  Basename 12/05/11 0150  LABPROT 14.5  INR 1.11     Studies/Results: Dg Chest Port 1 View  12/06/2011  *RADIOLOGY REPORT*  Clinical Data: Respiratory failure  PORTABLE CHEST - 1 VIEW  Comparison: 12/04/2011  Findings: The patient has been extubated.  Nasogastric tube and right IJ central line are stable in position.  Mild cardiomegaly persists.  Mild central pulmonary vascular congestion with some improvement in the mild interstitial edema.  No definite effusion. Patchy bibasilar atelectasis.  IMPRESSION:  1.  Extubation with improvement in mild interstitial edema.  Original Report Authenticated By: Osa Craver, M.D.    Assessment/Plan: Patient s/p small bowel resection  Continue NPO, NG Start wound VAC changes Add PCA Continue IV antibiotics    LOS: 4 days    Tina Chase A MD 12/07/2011 8:03 AM

## 2011-12-08 DIAGNOSIS — K56609 Unspecified intestinal obstruction, unspecified as to partial versus complete obstruction: Secondary | ICD-10-CM

## 2011-12-08 DIAGNOSIS — E213 Hyperparathyroidism, unspecified: Secondary | ICD-10-CM

## 2011-12-08 DIAGNOSIS — I10 Essential (primary) hypertension: Secondary | ICD-10-CM

## 2011-12-08 LAB — CBC
MCH: 29.2 pg (ref 26.0–34.0)
MCHC: 31.1 g/dL (ref 30.0–36.0)
MCV: 94 fL (ref 78.0–100.0)
Platelets: 152 10*3/uL (ref 150–400)
RDW: 14.9 % (ref 11.5–15.5)

## 2011-12-08 LAB — BASIC METABOLIC PANEL
CO2: 26 mEq/L (ref 19–32)
Calcium: 8.4 mg/dL (ref 8.4–10.5)
Creatinine, Ser: 0.57 mg/dL (ref 0.50–1.10)
GFR calc non Af Amer: >90 mL/min (ref >90)
Glucose, Bld: 104 mg/dL — ABNORMAL HIGH (ref 70–99)
Sodium: 147 mEq/L — ABNORMAL HIGH (ref 135–145)

## 2011-12-08 NOTE — Progress Notes (Signed)
GU  Patient in bed. Abdominal pain much improved. Has been up to chair.  Filed Vitals:   12/08/11 0400  BP: 112/81  Pulse: 109  Temp:   Resp: 30   Gen: NAD, awake Abdomen: soft, appropriately TTP GU: Foley in place draining clear urine.  Serum Cr 0.57  A/P: S/p Hernia repair with resection of ischemic small bowel. -Continue current care. -No indication for stone intervention at this time.

## 2011-12-08 NOTE — Progress Notes (Signed)
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 She had done well following this. She did note some swelling of her right abd since the time of surgery. Over the weekend she developed worsening abd pain and subsequently developed nausea and vomiting over the past several days. On further examination patient developed SBO with large ventral hernia.  Patient underwent small bowel resection and primary hernia repair.  Subsequently was transitioned to the ICU.  Initially was on pressors but was able to be weaned off and has been transitioned to ICU stepdown.  We were consulted for medical management given her history of asthma, hyperglycemia, nephrectomy, and hyperparathyroidism.  Subjective: Patient mentions that she feels better today.  Pain is currently tolerable on PCA pump.  Remains off of pressors and maintaining normal blood pressure levels.  Heart rate has still been elevated but reportedly has been better than yesterday and nurse reports that she has not had to administer prn lopressor.  Objective: Filed Vitals:   12/08/11 0400 12/08/11 0800 12/08/11 1200 12/08/11 1600  BP: 112/81     Pulse: 109     Temp: 98.1 F (36.7 C) 97.9 F (36.6 C) 98.9 F (37.2 C) 98.6 F (37 C)  TempSrc: Oral Oral Oral Axillary  Resp: 30     Height:      Weight:      SpO2: 100%      Weight change:   Intake/Output Summary (Last 24 hours) at 12/08/11 1733 Last data filed at 12/08/11 0600  Gross per 24 hour  Intake   1960 ml  Output    550 ml  Net   1410 ml    General: Alert, awake, oriented x3, in no acute distress.  HEENT: No bruits, no goiter.  Heart: Regular rate and rhythm, without murmurs, rubs, gallops.  Lungs: Clear to auscultation, bilateral air movement.  Abdomen: Wound vac in place. No errythema or active bleeding Neuro: Grossly intact, nonfocal.   Lab Results:  Basename 12/08/11 0400  12/07/11 0500  NA 147* 144  K 3.7 4.4  CL 118* 111  CO2 26 29  GLUCOSE 104* 111*  BUN 7 11  CREATININE 0.57 0.79  CALCIUM 8.4 9.8  MG -- --  PHOS -- --   No results found for this basename: AST:2,ALT:2,ALKPHOS:2,BILITOT:2,PROT:2,ALBUMIN:2 in the last 72 hours No results found for this basename: LIPASE:2,AMYLASE:2 in the last 72 hours  Basename 12/08/11 0400 12/07/11 0500  WBC 14.5* 18.7*  NEUTROABS -- --  HGB 9.7* 10.9*  HCT 31.2* 34.7*  MCV 94.0 93.3  PLT 152 159   No results found for this basename: CKTOTAL:3,CKMB:3,CKMBINDEX:3,TROPONINI:3 in the last 72 hours No components found with this basename: POCBNP:3 No results found for this basename: DDIMER:2 in the last 72 hours No results found for this basename: HGBA1C:2 in the last 72 hours No results found for this basename: CHOL:2,HDL:2,LDLCALC:2,TRIG:2,CHOLHDL:2,LDLDIRECT:2 in the last 72 hours No results found for this basename: TSH,T4TOTAL,FREET3,T3FREE,THYROIDAB in the last 72 hours No results found for this basename: VITAMINB12:2,FOLATE:2,FERRITIN:2,TIBC:2,IRON:2,RETICCTPCT:2 in the last 72 hours  Micro Results: Recent Results (from the past 240 hour(s))  URINE CULTURE     Status: Normal   Collection Time   12/03/11  8:38 AM      Component Value Range Status Comment   Specimen Description URINE, CATHETERIZED   Final    Special Requests Normal   Final    Culture  Setup Time [id number]   Final    Colony Count NO GROWTH   Final    Culture NO GROWTH   Final    Report Status 12/04/2011 FINAL   Final   SURGICAL PCR SCREEN     Status: Abnormal   Collection Time   12/04/11 10:05 AM      Component Value Range Status Comment   MRSA, PCR INVALID RESULTS, SPECIMEN SENT FOR CULTURE (*) NEGATIVE  Final INFORMED GCorinne Ports RN AT 1238 ON [id number] BY SHUEA   Staphylococcus aureus INVALID RESULTS, SPECIMEN SENT FOR CULTURE (*) NEGATIVE  Final   MRSA CULTURE     Status: Normal   Collection Time   12/04/11 10:05 AM      Component Value  Range Status Comment   Specimen Description NOSE   Final    Special Requests NONE   Final    Culture     Final    Value: NO STAPHYLOCOCCUS AUREUS ISOLATED     Note: NO MRSA ISOLATED   Report Status 12/06/2011 FINAL   Final     Studies/Results: No results found.  Medications: I have reviewed the patient's current medications.   Patient Active Hospital Problem List: Small bowel obstruction due to strangulated incisional hernia s/p expl lap, small bowel resection, primary hernia repair 12/04/11 (12/04/2011)  -Per primary team.  Will follow up with general surgery's recommendations.  HYPERLIPIDEMIA (08/13/2006) Last lipid panel showed a low HDL but other values were within normal limits.  Patient may require diet as outpatient.  Asthma (12/04/2011) Well controlled patient is sating at 100% and shows no signs of respiratory distress.  Respiratory failure following trauma and surgery (12/05/2011) Resolved patient sating well as indicated above.  Tachycardia (12/05/2011) At this point remains tachycardic.  May be due to hypovolemia.  Currently is resolving and patient has not required any B blocker today.  Patient is currently on MIVF running at 100cc/hr will plan on continuing this at this juncture.  Disposition: Pending clinical improvement after major surgery.  Family is aware that patient may require up to 2 weeks of care in the hospital pending her clinical improvement.    Point of contact for family has been designated to daughter Albertha Ghee [phone] , Who in turn will relay updates to rest of family.   LOS: 5 days   Penny Pia M.D.  [facility] 12/08/2011, 5:33 PM

## 2011-12-08 NOTE — Progress Notes (Signed)
4 Days Post-Op  Subjective: comfortable  Objective: Vital signs in last 24 hours: Temp:  [98.1 F (36.7 C)-98.8 F (37.1 C)] 98.1 F (36.7 C) (05/07 0400) Pulse Rate:  [109-116] 109  (05/07 0400) Resp:  [25-31] 30  (05/07 0400) BP: (103-126)/(47-81) 112/81 mmHg (05/07 0400) SpO2:  [100 %] 100 % (05/07 0400) FiO2 (%):  [31 %-32 %] 31 % (05/07 0400) Last BM Date: 11/28/11  Intake/Output from previous day: 05/06 0701 - 05/07 0700 In: 2985 [I.V.:2925; IV Piggyback:60] Out: 1415 [Urine:1265; Emesis/NG output:150] Intake/Output this shift:    Lungs clear Abdomen soft, +BS, VAC in place  Lab Results:   Basename 12/08/11 0400 12/07/11 0500  WBC 14.5* 18.7*  HGB 9.7* 10.9*  HCT 31.2* 34.7*  PLT 152 159   BMET  Basename 12/08/11 0400 12/07/11 0500  NA 147* 144  K 3.7 4.4  CL 118* 111  CO2 26 29  GLUCOSE 104* 111*  BUN 7 11  CREATININE 0.57 0.79  CALCIUM 8.4 9.8   PT/INR No results found for this basename: LABPROT:2,INR:2 in the last 72 hours ABG No results found for this basename: PHART:2,PCO2:2,PO2:2,HCO3:2 in the last 72 hours  Studies/Results: No results found.  Anti-infectives: Anti-infectives     Start     Dose/Rate Route Frequency Ordered Stop   12/05/11 1800   ertapenem (INVANZ) 1 g in sodium chloride 0.9 % 50 mL IVPB        1 g 100 mL/hr over 30 Minutes Intravenous Every 24 hours 12/04/11 2335     12/04/11 1000   ertapenem (INVANZ) 1 g in sodium chloride 0.9 % 50 mL IVPB  Status:  Discontinued        1 g 100 mL/hr over 30 Minutes Intravenous Every 24 hours 12/04/11 0806 12/04/11 2224          Assessment/Plan: s/p Procedure(s) (LRB): EXPLORATORY LAPAROTOMY (N/A) HERNIA REPAIR INCISIONAL (N/A) APPLICATION OF WOUND VAC ()  Will d/c NG Continue wound VAC Keep foley one more day Continue NPO except ice chips  LOS: 5 days    Lieutenant Abarca A 12/08/2011

## 2011-12-09 DIAGNOSIS — I10 Essential (primary) hypertension: Secondary | ICD-10-CM

## 2011-12-09 DIAGNOSIS — K56609 Unspecified intestinal obstruction, unspecified as to partial versus complete obstruction: Secondary | ICD-10-CM

## 2011-12-09 DIAGNOSIS — E213 Hyperparathyroidism, unspecified: Secondary | ICD-10-CM

## 2011-12-09 DIAGNOSIS — J45909 Unspecified asthma, uncomplicated: Secondary | ICD-10-CM

## 2011-12-09 LAB — CBC
MCHC: 31.2 g/dL (ref 30.0–36.0)
Platelets: 201 10*3/uL (ref 150–400)
RDW: 14.8 % (ref 11.5–15.5)
WBC: 10.3 10*3/uL (ref 4.0–10.5)

## 2011-12-09 LAB — BASIC METABOLIC PANEL
BUN: 6 mg/dL (ref 6–23)
Chloride: 110 mEq/L (ref 96–112)
GFR calc Af Amer: >90 mL/min (ref >90)
GFR calc non Af Amer: 90 mL/min — ABNORMAL LOW (ref >90)
Potassium: 4.1 mEq/L (ref 3.5–5.1)
Sodium: 143 mEq/L (ref 135–145)

## 2011-12-09 MED ORDER — PRO-STAT SUGAR FREE PO LIQD
30.0000 mL | Freq: Four times a day (QID) | ORAL | Status: DC
  Administered 2011-12-09 – 2011-12-12 (×9): 30 mL via ORAL
  Administered 2011-12-12: 10:00:00 via ORAL
  Administered 2011-12-12 – 2011-12-15 (×6): 30 mL via ORAL
  Filled 2011-12-09 (×27): qty 30

## 2011-12-09 MED ORDER — BIOTENE DRY MOUTH MT LIQD
15.0000 mL | Freq: Two times a day (BID) | OROMUCOSAL | Status: DC
  Administered 2011-12-10 – 2011-12-23 (×25): 15 mL via OROMUCOSAL

## 2011-12-09 MED ORDER — BOOST / RESOURCE BREEZE PO LIQD
1.0000 | Freq: Four times a day (QID) | ORAL | Status: DC
  Administered 2011-12-09 – 2011-12-13 (×9): 1 via ORAL

## 2011-12-09 MED ORDER — METOPROLOL TARTRATE 1 MG/ML IV SOLN
2.5000 mg | Freq: Three times a day (TID) | INTRAVENOUS | Status: DC
  Administered 2011-12-09 – 2011-12-11 (×8): 2.5 mg via INTRAVENOUS
  Filled 2011-12-09 (×10): qty 5

## 2011-12-09 NOTE — Progress Notes (Addendum)
Subjective: Patient doing okay. She just walked a few feet with physical therapy. Complains of some shortness of breath and wheezing. No bowel pain.  Objective: Weight change:   Intake/Output Summary (Last 24 hours) at 12/09/11 1255 Last data filed at 12/09/11 1000  Gross per 24 hour  Intake   2215 ml  Output   1330 ml  Net    885 ml    Filed Vitals:   12/09/11 1200  BP:   Pulse: 105  Temp: 99.1 F (37.3 C)  Resp: 22   General: Alert and oriented x3, mild distress secondary to shortness of breath and wheezing HEENT: Normocephalic, atraumatic, mucous membranes are moist Cardiovascular: Regular rhythm, borderline tachycardia Lungs: Bilateral mild end expiratory wheezing, right greater than left Abdomen: Soft, morbidly obese, hypoactive bowel sounds, nontender, wound VAC in place Extremities: No clubbing or cyanosis, trace pitting edema  Lab Results: Basic Metabolic Panel:  Basename 12/09/11 0500 12/08/11 0400  NA 143 147*  K 4.1 3.7  CL 110 118*  CO2 29 26  GLUCOSE 102* 104*  BUN 6 7  CREATININE 0.62 0.57  CALCIUM 9.8 8.4  MG -- --  PHOS -- --   CBC:  Basename 12/09/11 0500 12/08/11 0400  WBC 10.3 14.5*  NEUTROABS -- --  HGB 10.2* 9.7*  HCT 32.7* 31.2*  MCV 93.7 94.0  PLT 201 152   Studies/Results:  Medications: Scheduled Meds:   . antiseptic oral rinse  15 mL Mouth Rinse QID  . chlorhexidine  15 mL Mouth Rinse BID  . ertapenem (INVANZ) IV  1 g Intravenous Q24H  . heparin  5,000 Units Subcutaneous Q8H  . metoprolol  2.5 mg Intravenous Q8H  . morphine   Intravenous Q4H  . pantoprazole (PROTONIX) IV  40 mg Intravenous QHS   Continuous Infusions:   . dextrose 5 % and 0.9 % NaCl with KCl 20 mEq/L 100 mL/hr at 12/09/11 1000   PRN Meds:.diphenhydrAMINE, diphenhydrAMINE, levalbuterol, metoprolol, naloxone, ondansetron (ZOFRAN) IV, ondansetron (ZOFRAN) IV, ondansetron, sodium chloride  Assessment/Plan: Patient Active Hospital Problem List: Small bowel  obstruction due to strangulated incisional hernia s/p expl lap, small bowel resection, primary hernia repair 12/04/11 (12/04/2011)   Assessment: Improving.    Plan: Patient started on clear liquid diet. Transfer out of step down as per surgical service.   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    Plan: No signs of elevated calcium levels  HYPERLIPIDEMIA (08/13/2006)   Assessment: Stable.    Plan: Adjust diet as outpatient.  Asthma (12/04/2011)   Assessment: Continue when necessary nebulizers. Using Xopenex to avoid tachycardia.    Plan: If wheezing persists, will add steroids.  Respiratory failure following trauma and surgery (12/05/2011)   Assessment: Resolved    Plan: Patient is oxygen saturations are stable. It was noted that her oxygen saturation stayed drop with ambulation  Tachycardia (12/05/2011)   Assessment: Started patient on scheduled beta blocker. Stable. Some of this may be also from pain and minimal hypoxia and deconditioning. Continue to monitor.   Foley & central line discontinued.  LOS: 6 days   Talene Glastetter K 12/09/2011, 12:55 PM

## 2011-12-09 NOTE — Progress Notes (Addendum)
Physical Therapy Evaluation 12/09/11 0900  PT Visit Information  Last PT Received On 12/09/11  Assistance Needed +2 (lines and safety)  PT Time Calculation  PT Start Time 0918  PT Stop Time 0951  PT Time Calculation (min) 33 min  Subjective Data  Subjective The patient reports that she has been lifted via the maxi sky to the chair, but has not tried standing yet.    Patient Stated Goal to go home at discharge.    Precautions  Precaution Comments multiple   Home Living  Lives With Alone  Available Help at Discharge Friend(s)  Type of Home Apartment  Home Access Level entry;Elevator  Home Layout One level  Bathroom Shower/Tub Tub/shower unit;Curtain  Bathroom Toilet Handicapped height (a little higher)  Nurse, learning disability Yes  How Accessible Accessible via walker  Home Adaptive Equipment Walker - rolling;Shower chair with back  Prior Function  Level of Independence Independent  Driving Yes (before her first surgery)  Vocation Retired  Geneticist, molecular No difficulties  Cognition  Overall Cognitive Status Appears within functional limits for tasks assessed/performed  Arousal/Alertness Awake/alert  Behavior During Session [facility] for tasks performed  Right Lower Extremity Assessment  RLE ROM/Strength/Tone Deficits  RLE ROM/Strength/Tone Deficits grossly 3+/5 per functional assessment.    Left Lower Extremity Assessment  LLE ROM/Strength/Tone Deficits  LLE ROM/Strength/Tone Deficits grossly 3-3+/5 per functional assessment. Patient reports this is her "bad leg" during gait.    Bed Mobility  Bed Mobility Supine to Sit;Sitting - Scoot to Edge of Bed  Supine to Sit 1: +2 Total assist;With rails;HOB elevated  Supine to Sit: Patient Percentage 50%  Sitting - Scoot to Edge of Bed 1: +2 Total assist;With rail  Sitting - Scoot to [place] of Bed: Patient Percentage 60%  Details for Bed Mobility Assistance assist needed of trunk due to strain on stomach during supine to  sit, once sitting assist needed to help weight shift hips to EOB.    Transfers  Transfers Sit to Stand;Stand to Sit  Sit to Stand 1: +2 Total assist;With upper extremity assist;From bed  Sit to Stand: Patient Percentage 70%  Stand to Sit 1: +2 Total assist;With upper extremity assist;To chair/3-in-1;With armrests  Stand to Sit: Patient Percentage 70%  Details for Transfer Assistance assist needed to help stabilize trunk for balance and support trunk over weak legs during transitions, to sit assist needed to help control descent to sit. Verbal cues for safe hand placement, upright posture and extended knees and hips.    Ambulation/Gait  Ambulation/Gait Assistance 1: +2 Total assist  Ambulation/Gait: Patient Percentage 80  Ambulation Distance (Feet) 8 Feet  Assistive device Rolling walker  Ambulation/Gait Assistance Details 2 person assist for safety, line management and chair to follow.  Support needed at trunk for balance as patient was unable to walk very far before reporting knees felt weak and "buckley".    Gait Pattern Step-to pattern;Trunk flexed;Shuffle  General Exercises - Lower Extremity  Ankle Circles/Pumps AROM;Both;20 reps  PT - End of Session  Equipment Utilized During Treatment (RW)  Activity Tolerance Patient limited by fatigue;Patient limited by pain  Patient left in chair;with call bell/phone within reach  Nurse Communication Mobility status (lift pad in chair, O2 sat decrease with mobility)  PT Assessment  Clinical Impression Statement 71 y.o. female admitted to [facility] for abdominal pain, nausea and vomiting.  She is now s/p expl lap, small bowel resection, primary hernia repair 12/04/11.  She has two wound vacs attached to her surgical site.  She presents today with generalized weakness, increased abdominal pain, decreased mobility, strength, decreased O2 sats with mobility, balance and gait.  She would benefit from acute PT to maximize her independence, functional mobility and  safety prior to returning home where hopefully her daughter can help for a few weeks.    PT Recommendation/Assessment Patient needs continued PT services  PT Problem List Decreased strength;Decreased activity tolerance;Decreased balance;Decreased mobility;Decreased knowledge of use of DME;Cardiopulmonary status limiting activity;Pain;Obesity  PT Therapy Diagnosis  Difficulty walking;Abnormality of gait;Generalized weakness;Acute pain  PT Plan  PT Frequency Min 3X/week  PT Treatment/Interventions DME instruction;Gait training;Functional mobility training;Therapeutic activities;Therapeutic exercise;Balance training;Neuromuscular re-education;Patient/family education  PT Recommendation  Follow Up Recommendations Home health PT;Supervision/Assistance - 24 hour  Equipment Recommended None recommended by PT  Individuals Consulted  Consulted and Agree with Results and Recommendations Patient  Acute Rehab PT Goals  PT Goal Formulation With patient  Time For Goal Achievement 12/23/11  Potential to Achieve Goals Good  Pt will go Supine/Side to Sit with modified independence  PT Goal: Supine/Side to Sit - Progress Goal set today  Pt will go Sit to Supine/Side with modified independence  PT Goal: Sit to Supine/Side - Progress Goal set today  Pt will go Sit to Stand with supervision  PT Goal: Sit to Stand - Progress Goal set today  Pt will go Stand to Sit with supervision  PT Goal: Stand to Sit - Progress Goal set today  Pt will Transfer Bed to Chair/Chair to Bed with supervision  PT Transfer Goal: Bed to Chair/Chair to Bed - Progress Goal set today  Pt will Ambulate 51 - 150 feet;with supervision;with rolling walker  PT Goal: Ambulate - Progress Goal set today  Written Expression  Dominant Hand Right   Onie B. Kentrell Guettler, PT, DPT [phone]

## 2011-12-09 NOTE — Progress Notes (Signed)
12/09/2011 Pt evaluation complete. Notes to follow later.  I am hoping the patient can progress well enough to return home with HHPT f/u like she did after her other surgery.  She is going to ask her daughter to come stay with her again.    Thanks, Rollene Rotunda. Sulay Brymer, PT, DPT [phone]

## 2011-12-09 NOTE — Evaluation (Addendum)
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 She had done well following this. She did note some swelling of her right abd since the time of surgery. Over the weekend she developed worsening abd pain and subsequently developed nausea and vomiting over the past several days. She displays decreased strength, increased pain, and decreased independence with ADLs. Will follow on acute to improver her independence with self care tasks.      OT Assessment  Patient needs continued OT Services    Follow Up Recommendations  Home health OT;Skilled nursing facility--depends on progress/famly availability to assist  Pt hoping to discharge home with daughter assisting for a few weeks.   Equipment Recommendations  Other (comment);3 in 1 bedside comode (possibly a 3in1 if she goes home. will further assess)    Frequency Min 2X/week    Precautions / Restrictions Precautions Precautions: Fall        ADL  Eating/Feeding: Other (comment);Not assessed (ice chips currently) Grooming: Simulated;Wash/dry face;Set up Where Assessed - Grooming: Supported sitting Upper Body Bathing: Simulated;Chest;Right arm;Left arm;Abdomen;Moderate assistance (due to multiple lines/wound vac) Where Assessed - Upper Body Bathing: Sitting, chair;Supported Lower Body Bathing: Simulated;Maximal assistance;Other (comment) (periareas not tested. eval performed from chair level) without adaptive equipment Where Assessed - Lower Body Bathing: Sitting, chair;Supported Location manager Dressing: Simulated;Moderate assistance Where Assessed - Upper Body Dressing: Sitting, chair;Supported Lower Body  Dressing: +1 Total assistance;Other (comment) (with socks. unable to lean forward due to abdominal pain ) Where Assessed - Lower Body Dressing: Sitting, chair;Supported Statistician: Not assessed Statistician Method: Not assessed Toileting - Clothing Manipulation: Not assessed Toileting - Hygiene: Not assessed Tub/Shower Transfer: Not assessed Tub/Shower Transfer Method: Not assessed Equipment Used: Long-handled shoe horn;Long-handled sponge;Reacher;Sock aid ADL Comments: Pt interested in AE for LB ADL as it is very difficult and painful to forward reach due to wound and wound vac. She states she has Medicaid. Will have to confirm to issue AE. Discussed a walker bag for using her reacher around the house also. Pt very pleasant and motivated.     OT Goals Acute Rehab OT Goals OT Goal Formulation: With patient Time For Goal Achievement: 12/23/11 Potential to Achieve Goals: Good ADL Goals Pt Will Perform Grooming: with supervision;Unsupported;Sitting, edge of bed;Sitting, chair ADL Goal: Grooming - Progress: Goal set today Pt Will Perform Lower Body Bathing: with mod assist;Sit to stand from chair;Sit to stand from bed;with adaptive equipment ADL Goal: Lower Body Bathing - Progress: Goal set today Pt Will Perform Lower Body Dressing: with mod assist;Sit to stand from chair;Sit to stand from bed;with adaptive equipment ADL Goal: Lower Body Dressing - Progress: Goal set today Pt Will Transfer to Toilet: with mod assist;with DME;3-in-1;Ambulation ADL Goal: Toilet Transfer - Progress: Goal set today Pt Will Perform Toileting - Clothing Manipulation: with min assist;Standing ADL Goal: Toileting - Clothing Manipulation - Progress: Goal set today  Visit Information  Last OT Received On: 12/09/11 Assistance Needed: +2 (per PT +2 mobility)    Subjective Data  Subjective: It was diffcult (when asked about getting up to the chair with PT) Patient Stated Goal: to go home if able. with  daughter to assist   Prior Functioning  Home Living Lives With: Alone Available Help at Discharge: Friend(s);Family;Other (Comment) (daughter can come from out of state for a few weeks) Type of Home: Apartment Home Access: Level entry;Elevator Home Layout: One level Bathroom Shower/Tub: Tub/shower unit;Curtain Bathroom Toilet: Handicapped height (a little higher) Bathroom Accessibility: Yes How Accessible: Accessible via walker Home Adaptive Equipment: Walker - rolling;Shower chair with back Prior Function Level of Independence: Independent Driving: Yes (before her first surgery) Vocation: Retired Musician: No difficulties Dominant Hand: Right    Cognition  Overall Cognitive Status: Appears within functional limits for tasks assessed/performed Arousal/Alertness: Awake/alert Orientation Level: Appears intact for tasks assessed Behavior During Session: [facility] for tasks performed    Extremity/Trunk Assessment Right Upper Extremity Assessment RUE ROM/Strength/Tone: Within functional levels Left Upper Extremity Assessment LUE ROM/Strength/Tone: Within functional levels   Mobility     Exercise    Balance    End of Session OT - End of Session Activity Tolerance: Patient limited by pain Patient left: in chair;with call bell/phone within reach   Ear[place], Burson [phone] 12/09/2011, 12:52 PM

## 2011-12-09 NOTE — Progress Notes (Signed)
5 Days Post-Op  Subjective: No complaints Denies SOB Tolerated NG out, no nausea  Objective: Vital signs in last 24 hours: Temp:  [97.9 F (36.6 C)-99.8 F (37.7 C)] 99.8 F (37.7 C) (05/08 0400) Pulse Rate:  [105-110] 105  (05/08 0400) Resp:  [25-39] 27  (05/08 0400) BP: (105-142)/(65-83) 135/80 mmHg (05/08 0400) SpO2:  [96 %-100 %] 98 % (05/08 0400) Weight:  [312 lb 13.3 oz (141.9 kg)] 312 lb 13.3 oz (141.9 kg) (05/08 0500) Last BM Date: 11/28/11  Intake/Output from previous day: 05/07 0701 - 05/08 0700 In: 2465 [I.V.:2415; IV Piggyback:50] Out: 1400 [Urine:1400] Intake/Output this shift:    Lungs clear Abdomen soft, VAC in place, +BS  Lab Results:   Basename 12/09/11 0500 12/08/11 0400  WBC 10.3 14.5*  HGB 10.2* 9.7*  HCT 32.7* 31.2*  PLT 201 152   BMET  Basename 12/09/11 0500 12/08/11 0400  NA 143 147*  K 4.1 3.7  CL 110 118*  CO2 29 26  GLUCOSE 102* 104*  BUN 6 7  CREATININE 0.62 0.57  CALCIUM 9.8 8.4   PT/INR No results found for this basename: LABPROT:2,INR:2 in the last 72 hours ABG No results found for this basename: PHART:2,PCO2:2,PO2:2,HCO3:2 in the last 72 hours  Studies/Results: No results found.  Anti-infectives: Anti-infectives     Start     Dose/Rate Route Frequency Ordered Stop   12/05/11 1800   ertapenem (INVANZ) 1 g in sodium chloride 0.9 % 50 mL IVPB        1 g 100 mL/hr over 30 Minutes Intravenous Every 24 hours 12/04/11 2335     12/04/11 1000   ertapenem (INVANZ) 1 g in sodium chloride 0.9 % 50 mL IVPB  Status:  Discontinued        1 g 100 mL/hr over 30 Minutes Intravenous Every 24 hours 12/04/11 0806 12/04/11 2224          Assessment/Plan: s/p Procedure(s) (LRB): EXPLORATORY LAPAROTOMY (N/A) HERNIA REPAIR INCISIONAL (N/A) APPLICATION OF WOUND VAC ()  Start clear liquids PT/OT Step down status  LOS: 6 days    Seini Lannom A 12/09/2011

## 2011-12-09 NOTE — Progress Notes (Signed)
INITIAL ADULT NUTRITION ASSESSMENT Date: 12/09/2011   Time: 12:34 PM Reason for Assessment: ICU rounds, at risk for malnutrition  ASSESSMENT: Female 71 y.o.  Dx: Small bowel obstruction- surgery 5/3:  Hernia repair, exp lap, resection of ischemic small bowel, wound VAC  Hx:  Past Medical History  Diagnosis Date  . Hyperlipidemia   . Hypertension   . Hyperparathyroidism   . Obesity   . Hyperglycemia   . Hypercalcemia   . Asthma   . Shortness of breath     wtih exertion   . Chronic kidney disease     right non functioning kidney   . Arthritis     left knee  and left shoulder    Past Surgical History  Procedure Date  . Tubal ligation   . Other surgical history     D and C x 3   . Laparoscopic nephrectomy 08/19/2011    Procedure: LAPAROSCOPIC NEPHRECTOMY;  Surgeon: Milford Cage, MD;  Location: [facility]S;  Service: Urology;  Laterality: Right;    Related Meds:  Scheduled Meds:   . antiseptic oral rinse  15 mL Mouth Rinse QID  . chlorhexidine  15 mL Mouth Rinse BID  . ertapenem (INVANZ) IV  1 g Intravenous Q24H  . heparin  5,000 Units Subcutaneous Q8H  . metoprolol  2.5 mg Intravenous Q8H  . morphine   Intravenous Q4H  . pantoprazole (PROTONIX) IV  40 mg Intravenous QHS   Continuous Infusions:   . dextrose 5 % and 0.9 % NaCl with KCl 20 mEq/L 100 mL/hr at 12/09/11 1000   PRN Meds:.diphenhydrAMINE, diphenhydrAMINE, levalbuterol, metoprolol, naloxone, ondansetron (ZOFRAN) IV, ondansetron (ZOFRAN) IV, ondansetron, sodium chloride   Ht: 5\' 4"  (162.6 cm)  Wt: 312 lb 13.3 oz (141.9 kg)  Ideal Wt: 54.7 kg  % Ideal Wt: 259  Usual Wt: 308-322# over the last 2 years Wt Readings from Last 15 Encounters:  12/09/11 312 lb 13.3 oz (141.9 kg)  12/09/11 312 lb 13.3 oz (141.9 kg)  10/06/11 309 lb 11.2 oz (140.479 kg)  09/15/11 308 lb 14.4 oz (140.116 kg)  08/22/11 332 lb 0.2 oz (150.6 kg)  08/22/11 332 lb 0.2 oz (150.6 kg)  08/11/11 321 lb 11.2 oz (145.922 kg)    07/23/11 322 lb 12.8 oz (146.421 kg)  03/12/11 320 lb 8 oz (145.378 kg)  11/25/10 319 lb (144.697 kg)  10/23/10 322 lb (146.058 kg)  10/08/10 324 lb 4 oz (147.079 kg)  09/23/10 323 lb (146.512 kg)  06/02/10 319 lb 0.2 oz (144.703 kg)  12/25/09 310 lb 6.4 oz (140.797 kg)    % Usual Wt: 1000  Body mass index is 53.70 kg/(m^2).  Extreme Obesity grade 3  Food/Nutrition Related Hx: Regular diet until 1 week prior to admit.  No appetite.  The thought of food made her sick.  Only sipped on water for 1 week prior to admit.  Labs:  CMP     Component Value Date/Time   NA 143 12/09/2011 0500   K 4.1 12/09/2011 0500   CL 110 12/09/2011 0500   CO2 29 12/09/2011 0500   GLUCOSE 102* 12/09/2011 0500   BUN 6 12/09/2011 0500   CREATININE 0.62 12/09/2011 0500   CREATININE 0.78 09/23/2010 0925   CALCIUM 9.8 12/09/2011 0500   CALCIUM 10.5 06/10/2009 1342   PROT 5.8* 12/05/2011 0150   ALBUMIN 2.6* 12/05/2011 0150   AST 18 12/05/2011 0150   ALT 11 12/05/2011 0150   ALKPHOS 79 12/05/2011 0150  BILITOT 0.8 12/05/2011 0150   GFRNONAA 90* 12/09/2011 0500   GFRAA >90 12/09/2011 0500    I/O last 3 completed shifts: In: 4125 [I.V.:4065; IV Piggyback:60] Out: 1950 [Urine:1950] Total I/O In: 300 [I.V.:300] Out: 200 [Urine:200]   Diet Order: Clear Liquid  Supplements/Tube Feeding:  none  IVF:    dextrose 5 % and 0.9 % NaCl with KCl 20 mEq/L Last Rate: 100 mL/hr at 12/09/11 1000    Estimated Nutritional Needs:per day   Kcal: 1900-2000 Protein: 100-120 g Fluid: >1.9L  Pt CL/NPO diet since admit x 6 days and only ice water per pt report for 1 week prior to admit secondary to poor appetite.  No recent weight change but high risk for malnutrition.  Passing gas but no BM yet.  NUTRITION DIAGNOSIS: -Inadequate oral intake (NI-2.1).  Status: Ongoing  RELATED TO: altered gi function  AS EVIDENCE BY: clear liquid diet  MONITORING/EVALUATION(Goals): Monitor:  Ability to advance diet, diet tolerance, weight trend,  labs Goal:  Tolerate diet advancement to meet estimated needs with diet plus supplements.  EDUCATION NEEDS: -No education needs identified at this time  INTERVENTION: 1.  Resource Breeze QID ( 32 g protein/ 1000 kcal/day) 2.  Prostat 30 ml QID (60g protein/288 kcal/day) 3.  If pt not able to tolerate po today then recommend tpn  Dietitian [phone]  DOCUMENTATION CODES Per approved criteria  -Morbid Obesity    Derrell Lolling Anastasia Fiedler 12/09/2011, 12:34 PM

## 2011-12-10 ENCOUNTER — Inpatient Hospital Stay (HOSPITAL_COMMUNITY): Payer: PRIVATE HEALTH INSURANCE

## 2011-12-10 DIAGNOSIS — K56609 Unspecified intestinal obstruction, unspecified as to partial versus complete obstruction: Secondary | ICD-10-CM

## 2011-12-10 DIAGNOSIS — I10 Essential (primary) hypertension: Secondary | ICD-10-CM

## 2011-12-10 DIAGNOSIS — I5033 Acute on chronic diastolic (congestive) heart failure: Secondary | ICD-10-CM | POA: Diagnosis not present

## 2011-12-10 DIAGNOSIS — E213 Hyperparathyroidism, unspecified: Secondary | ICD-10-CM

## 2011-12-10 DIAGNOSIS — I5021 Acute systolic (congestive) heart failure: Secondary | ICD-10-CM

## 2011-12-10 LAB — CBC
MCH: 29.4 pg (ref 26.0–34.0)
MCHC: 31.3 g/dL (ref 30.0–36.0)
MCV: 94.1 fL (ref 78.0–100.0)
Platelets: 220 10*3/uL (ref 150–400)
RBC: 3.74 MIL/uL — ABNORMAL LOW (ref 3.87–5.11)
RDW: 14.7 % (ref 11.5–15.5)

## 2011-12-10 LAB — BASIC METABOLIC PANEL
BUN: 7 mg/dL (ref 6–23)
CO2: 27 mEq/L (ref 19–32)
Calcium: 9.9 mg/dL (ref 8.4–10.5)
Creatinine, Ser: 0.64 mg/dL (ref 0.50–1.10)
GFR calc non Af Amer: 89 mL/min — ABNORMAL LOW (ref >90)
Glucose, Bld: 125 mg/dL — ABNORMAL HIGH (ref 70–99)
Sodium: 142 mEq/L (ref 135–145)

## 2011-12-10 LAB — BLOOD GAS, ARTERIAL
Acid-Base Excess: 0.7 mmol/L (ref 0.0–2.0)
Drawn by: 310571
O2 Content: 4 L/min
O2 Saturation: 90 %
Patient temperature: 98.6
TCO2: 24.8 mmol/L (ref 0–100)
pCO2 arterial: 52 mmHg — ABNORMAL HIGH (ref 35.0–45.0)

## 2011-12-10 MED ORDER — OXYCODONE-ACETAMINOPHEN 5-325 MG PO TABS
1.0000 | ORAL_TABLET | ORAL | Status: DC | PRN
  Administered 2011-12-12: 2 via ORAL
  Filled 2011-12-10: qty 2

## 2011-12-10 MED ORDER — FUROSEMIDE 10 MG/ML IJ SOLN
40.0000 mg | Freq: Two times a day (BID) | INTRAMUSCULAR | Status: DC
  Administered 2011-12-10: 40 mg via INTRAVENOUS
  Filled 2011-12-10 (×2): qty 4

## 2011-12-10 MED ORDER — SODIUM CHLORIDE 0.9 % IV SOLN
INTRAVENOUS | Status: DC
  Administered 2011-12-10 – 2011-12-11 (×2): via INTRAVENOUS

## 2011-12-10 NOTE — Progress Notes (Addendum)
GU  Patient is doing much better. Out of ICU/Stepdown. NG is out. Working with physical therapy. Positive BM this morning. No left flank pain. Renal function is good.   A/P: Bowel obstruction s/p hernia repair. 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 Will order KUB today to keep an eye on her left urinary stone. Scheduled for follow up with me 12/31/11 at 10:30 am.

## 2011-12-10 NOTE — Progress Notes (Signed)
Subjective: By late morning, patient was noted to have increased work of breathing and some mild drop in her O2 saturations requiring increased to 4 L. By the time that I saw her about an hour later after her oxygen was increased and appropriate lab work and x-ray was done, she was feeling better. States her breathing is little bit easier after breathing treatment. Denies any chest pain. Her complaint of loose stooling following clear liquids. She's had several episodes of bowel incontinence.  Objective: Weight change:   Intake/Output Summary (Last 24 hours) at 12/10/11 1522 Last data filed at 12/10/11 1422  Gross per 24 hour  Intake 2925.67 ml  Output      6 ml  Net 2919.67 ml    Filed Vitals:   12/10/11 1420  BP: 121/83  Pulse: 130  Temp: 97.6 F (36.4 C)  Resp: 31   General: Alert and oriented x3, fatigue HEENT: Normocephalic, atraumatic, mucous membranes are moist Cardiovascular: Regular rhythm, borderline tachycardia Lungs: Decreased breath sounds throughout secondary to body habitus Abdomen: Soft, morbidly obese, hypoactive bowel sounds, nontender, wound VAC in place Extremities: No clubbing or cyanosis, trace pitting edema  Lab Results: Basic Metabolic Panel:  Basename 12/10/11 1305 12/09/11 0500  NA 142 143  Chase 3.9 4.1  CL 110 110  CO2 27 29  GLUCOSE 125* 102*  BUN 7 6  CREATININE 0.64 0.62  CALCIUM 9.9 9.8  MG -- --  PHOS -- --   CBC:  Basename 12/10/11 1305 12/09/11 0500  WBC 11.6* 10.3  NEUTROABS -- --  HGB 11.0* 10.2*  HCT 35.2* 32.7*  MCV 94.1 93.7  PLT 220 201    Medications: Scheduled Meds:    . antiseptic oral rinse  15 mL Mouth Rinse BID  . ertapenem (INVANZ) IV  1 g Intravenous Q24H  . feeding supplement  30 mL Oral QID  . feeding supplement  1 Container Oral QID  . furosemide  40 mg Intravenous Q12H  . heparin  5,000 Units Subcutaneous Q8H  . metoprolol  2.5 mg Intravenous Q8H  . morphine   Intravenous Q4H  . pantoprazole (PROTONIX)  IV  40 mg Intravenous QHS  . DISCONTD: antiseptic oral rinse  15 mL Mouth Rinse QID  . DISCONTD: chlorhexidine  15 mL Mouth Rinse BID   Continuous Infusions:    . DISCONTD: dextrose 5 % and 0.9 % NaCl with KCl 20 mEq/L 20 mL/hr at 12/10/11 1213   PRN Meds:.diphenhydrAMINE, diphenhydrAMINE, levalbuterol, metoprolol, naloxone, ondansetron (ZOFRAN) IV, ondansetron (ZOFRAN) IV, ondansetron, oxyCODONE-acetaminophen, sodium chloride  Assessment/Plan: Patient Active Hospital Problem List: BNP elevated at 5500. Patient has a history of grade 1 diastolic dysfunction and likely with volume resuscitation, she is now acute diastolic heart failure. Have started diuretics and following intake and output. Noted mildly elevated white cell count, but I doubt that she has infection. Recheck CBC in the morning.  Small bowel obstruction due to strangulated incisional hernia s/p expl lap, small bowel resection, primary hernia repair 12/04/11 (12/04/2011)   Assessment: Improving.    Plan: Patient started on clear liquid diet. Transfer out of step down as per surgical service.   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    Plan: No signs of elevated calcium levels  HYPERLIPIDEMIA (08/13/2006)   Assessment: Stable.    Plan: Adjust diet as outpatient.  Asthma (12/04/2011)  this is to be stable. I do not think that her current respirations are from asthma. See above.  Respiratory failure following trauma and  surgery (12/05/2011)  initial respiratory failure is stable after surgery. See above for new plans.  Tachycardia (12/05/2011)   Assessment: Started patient on scheduled beta blocker. Stable. Some of this may be also from pain and minimal hypoxia and deconditioning. Continue to monitor.   Foley & central line discontinued, following PICC line placement.  LOS: 7 days   Tina Chase 12/10/2011, 3:22 PM

## 2011-12-10 NOTE — Progress Notes (Signed)
I have seen and examined the patient and agree with the assessment and plans.  Linette Gunderson A. Lorelee Mclaurin  MD, FACS  

## 2011-12-10 NOTE — Progress Notes (Signed)
6 Days Post-Op  Subjective: Says she feels OK +BM yesterday and she's leaking stool now.  I got her up to bedside commode.    Objective: Vital signs in last 24 hours: Temp:  [98.1 F (36.7 C)-99.1 F (37.3 C)] 98.6 F (37 C) (05/09 0615) Pulse Rate:  [98-115] 115  (05/09 0615) Resp:  [20-36] 20  (05/09 0800) BP: (116-153)/(76-92) 116/80 mmHg (05/09 0615) SpO2:  [90 %-98 %] 91 % (05/09 0800) Last BM Date: 11/28/11  Intake/Output from previous day: 05/08 0701 - 05/09 0700 In: 2462 [I.V.:2400; IV Piggyback:62] Out: 204 [Urine:203; Stool:1] Intake/Output this shift:    General appearance: alert, cooperative and no distress Chest: some rales bilat. Abd:  Soft, +BS Wound vac's in place  Up to BR now.  Lab Results:   Basename 12/09/11 0500 12/08/11 0400  WBC 10.3 14.5*  HGB 10.2* 9.7*  HCT 32.7* 31.2*  PLT 201 152    BMET  Basename 12/09/11 0500 12/08/11 0400  NA 143 147*  K 4.1 3.7  CL 110 118*  CO2 29 26  GLUCOSE 102* 104*  BUN 6 7  CREATININE 0.62 0.57  CALCIUM 9.8 8.4   PT/INR No results found for this basename: LABPROT:2,INR:2 in the last 72 hours   Lab 12/05/11 0150  AST 18  ALT 11  ALKPHOS 79  BILITOT 0.8  PROT 5.8*  ALBUMIN 2.6*     Lipase  No results found for this basename: lipase     Studies/Results: No results found.  Medications:    . antiseptic oral rinse  15 mL Mouth Rinse BID  . ertapenem (INVANZ) IV  1 g Intravenous Q24H  . feeding supplement  30 mL Oral QID  . feeding supplement  1 Container Oral QID  . heparin  5,000 Units Subcutaneous Q8H  . metoprolol  2.5 mg Intravenous Q8H  . morphine   Intravenous Q4H  . pantoprazole (PROTONIX) IV  40 mg Intravenous QHS  . DISCONTD: antiseptic oral rinse  15 mL Mouth Rinse QID  . DISCONTD: chlorhexidine  15 mL Mouth Rinse BID    Assessment/Plan Small bowel obstruction due to strangulated incisional hernia s/p expl lap, small bowel resection, primary hernia repair 12/04/11  (12/04/2011) S/P NEPHRECTOMY Jan 2013 Respiratory failure following trauma and surgery /(12/05/2011) Tachycardia (12/05/2011) Asthma (12/04/2011) HYPERPARATHYROIDISM HYPERLIPIDEMIA  BMI 55  Plan:  Start transitioning to PO medicines, will defer to Medicine.  I will start oral pain meds and get her off PCA; increase diet, mobilize. She has OT and PT consults.          LOS: 7 days    Dezirae Service 12/10/2011

## 2011-12-10 NOTE — Progress Notes (Signed)
Notified dr. Rito Ehrlich of increased respirations, increased need for 02, labored breathing.  MD placed orders for lasix, labs, and cxr.  Will continue to monitor.  Patient stable.  Barrie Lyme [id number] 12/10/2011

## 2011-12-11 ENCOUNTER — Encounter (HOSPITAL_COMMUNITY): Payer: Self-pay | Admitting: General Surgery

## 2011-12-11 DIAGNOSIS — I5021 Acute systolic (congestive) heart failure: Secondary | ICD-10-CM

## 2011-12-11 DIAGNOSIS — E213 Hyperparathyroidism, unspecified: Secondary | ICD-10-CM

## 2011-12-11 DIAGNOSIS — K56609 Unspecified intestinal obstruction, unspecified as to partial versus complete obstruction: Secondary | ICD-10-CM

## 2011-12-11 DIAGNOSIS — D7289 Other specified disorders of white blood cells: Secondary | ICD-10-CM

## 2011-12-11 LAB — BASIC METABOLIC PANEL
BUN: 9 mg/dL (ref 6–23)
CO2: 30 mEq/L (ref 19–32)
Calcium: 9.4 mg/dL (ref 8.4–10.5)
GFR calc non Af Amer: 86 mL/min — ABNORMAL LOW (ref >90)
Glucose, Bld: 112 mg/dL — ABNORMAL HIGH (ref 70–99)
Sodium: 143 mEq/L (ref 135–145)

## 2011-12-11 LAB — URINALYSIS, ROUTINE W REFLEX MICROSCOPIC
Bilirubin Urine: NEGATIVE
Glucose, UA: NEGATIVE mg/dL
Ketones, ur: NEGATIVE mg/dL
Nitrite: NEGATIVE
Protein, ur: NEGATIVE mg/dL
pH: 5.5 (ref 5.0–8.0)

## 2011-12-11 LAB — URINE MICROSCOPIC-ADD ON

## 2011-12-11 LAB — CBC
MCH: 29.5 pg (ref 26.0–34.0)
MCHC: 31.4 g/dL (ref 30.0–36.0)
MCV: 93.9 fL (ref 78.0–100.0)
Platelets: 226 10*3/uL (ref 150–400)
RBC: 3.63 MIL/uL — ABNORMAL LOW (ref 3.87–5.11)

## 2011-12-11 LAB — LACTIC ACID, PLASMA: Lactic Acid, Venous: 1.2 mmol/L (ref 0.5–2.2)

## 2011-12-11 MED ORDER — SODIUM CHLORIDE 0.9 % IJ SOLN
10.0000 mL | INTRAMUSCULAR | Status: DC | PRN
Start: 2011-12-11 — End: 2011-12-23
  Administered 2011-12-12 – 2011-12-19 (×6): 10 mL

## 2011-12-11 MED ORDER — HYDROCODONE-ACETAMINOPHEN 5-325 MG PO TABS
1.0000 | ORAL_TABLET | ORAL | Status: DC | PRN
  Administered 2011-12-11 – 2011-12-20 (×11): 2 via ORAL
  Filled 2011-12-11 (×11): qty 2

## 2011-12-11 MED ORDER — PANTOPRAZOLE SODIUM 40 MG PO TBEC
40.0000 mg | DELAYED_RELEASE_TABLET | Freq: Every day | ORAL | Status: DC
  Administered 2011-12-11 – 2011-12-23 (×13): 40 mg via ORAL
  Filled 2011-12-11 (×13): qty 1

## 2011-12-11 MED ORDER — FUROSEMIDE 10 MG/ML IJ SOLN
40.0000 mg | Freq: Two times a day (BID) | INTRAMUSCULAR | Status: DC
  Administered 2011-12-11: 40 mg via INTRAVENOUS
  Filled 2011-12-11 (×3): qty 4

## 2011-12-11 MED ORDER — METOPROLOL TARTRATE 25 MG PO TABS
25.0000 mg | ORAL_TABLET | Freq: Two times a day (BID) | ORAL | Status: DC
  Administered 2011-12-11 – 2011-12-17 (×12): 25 mg via ORAL
  Filled 2011-12-11 (×13): qty 1

## 2011-12-11 MED ORDER — MORPHINE SULFATE 2 MG/ML IJ SOLN
2.0000 mg | INTRAMUSCULAR | Status: DC | PRN
  Administered 2011-12-11 – 2011-12-22 (×11): 2 mg via INTRAVENOUS
  Filled 2011-12-11 (×11): qty 1

## 2011-12-11 MED ORDER — FUROSEMIDE 40 MG PO TABS
40.0000 mg | ORAL_TABLET | Freq: Two times a day (BID) | ORAL | Status: DC
  Administered 2011-12-11 – 2011-12-14 (×6): 40 mg via ORAL
  Filled 2011-12-11 (×8): qty 1

## 2011-12-11 NOTE — Progress Notes (Addendum)
Subjective: Patient continues to have episodes of loose liquid stool. No fevers overnight reported. She states her breathing is a little bit easier, but she has such problems with bladder incontinence because of the Lasix and unable to get the bathroom. She was relieved when they placed a Foley catheter. She states overall her breathing is okay and she limits mild wheezing when working with physical therapy.  Objective: Weight change:   Intake/Output Summary (Last 24 hours) at 12/11/11 1426 Last data filed at 12/11/11 0900  Gross per 24 hour  Intake 970.67 ml  Output      2 ml  Net 968.67 ml    Filed Vitals:   12/11/11 1308  BP:   Pulse:   Temp:   Resp: 25   General: Alert and oriented x3, fatigue HEENT: Normocephalic, atraumatic, mucous membranes are moist Cardiovascular: Regular rhythm, borderline tachycardia Lungs: Decreased breath sounds throughout secondary to body habitus Abdomen: Soft, morbidly obese, hypoactive bowel sounds, nontender, wound VAC in place Extremities: No clubbing or cyanosis, trace pitting edema  Lab Results: Basic Metabolic Panel:  Basename 12/11/11 0530 12/10/11 1305  NA 143 142  K 3.5 3.9  CL 109 110  CO2 30 27  GLUCOSE 112* 125*  BUN 9 7  CREATININE 0.71 0.64  CALCIUM 9.4 9.9  MG -- --  PHOS -- --   CBC:  Basename 12/11/11 0530 12/10/11 1305  WBC 12.4* 11.6*  NEUTROABS -- --  HGB 10.7* 11.0*  HCT 34.1* 35.2*  MCV 93.9 94.1  PLT 226 220    Medications: Scheduled Meds:    . antiseptic oral rinse  15 mL Mouth Rinse BID  . ertapenem (INVANZ) IV  1 g Intravenous Q24H  . feeding supplement  30 mL Oral QID  . feeding supplement  1 Container Oral QID  . furosemide  40 mg Intravenous Q12H  . heparin  5,000 Units Subcutaneous Q8H  . metoprolol  2.5 mg Intravenous Q8H  . pantoprazole (PROTONIX) IV  40 mg Intravenous QHS  . DISCONTD: furosemide  40 mg Intravenous Q12H  . DISCONTD: morphine   Intravenous Q4H   Continuous Infusions:    . DISCONTD: sodium chloride 20 mL/hr at 12/11/11 0613  . DISCONTD: dextrose 5 % and 0.9 % NaCl with KCl 20 mEq/L Stopped (12/10/11 1809)   PRN Meds:.HYDROcodone-acetaminophen, levalbuterol, metoprolol, morphine injection, ondansetron (ZOFRAN) IV, ondansetron, oxyCODONE-acetaminophen, DISCONTD: diphenhydrAMINE, DISCONTD: diphenhydrAMINE, DISCONTD: naloxone, DISCONTD: ondansetron (ZOFRAN) IV, DISCONTD: sodium chloride  Assessment/Plan: Patient Active Hospital Problem List: BNP elevated at 5500. Patient has a history of grade 1 diastolic dysfunction and likely with volume resuscitation, she is now acute diastolic heart failure. Have started diuretics and following intake and output. Unable to get good output readings so we placed a Foley catheter.  Leukocytosis: Concern about possibility of infection. Chest x-ray was unremarkable. Have ordered urinalysis which was negative and awaiting C. difficile PCR.  Small bowel obstruction due to strangulated incisional hernia s/p expl lap, small bowel resection, primary hernia repair 12/04/11 (12/04/2011)  followup films noted large ileus. Surgery managing.    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    Plan: No signs of elevated calcium levels  HYPERLIPIDEMIA (08/13/2006)   Assessment: Stable.    Plan: Adjust diet as outpatient.  Asthma (12/04/2011)  this is to be stable. I do not think that her current respirations are from asthma. See above.  Respiratory failure following trauma and surgery (12/05/2011)  initial respiratory failure is stable after surgery.  Tachycardia (12/05/2011)  Assessment: Started patient on scheduled beta blocker. Stable. Some of this may be also from pain and minimal hypoxia and deconditioning. Continue to monitor.    today's date 8 on the patient's central line. We are still waiting PICC line placement I'm concerned of the possibility of her elevated white blood cell count may be from early line infection.  I've ordered a pro calcitonin and lactic acid level. In addition of Ardee instructed for central line to be removed. We will change over her existing IV medications to by mouth.  LOS: 8 days   Milania Haubner K 12/11/2011, 2:26 PM

## 2011-12-11 NOTE — Progress Notes (Signed)
7 Days Post-Op  Subjective: Starting to have loose stools  Objective: Vital signs in last 24 hours: Temp:  [97.5 F (36.4 C)-98 F (36.7 C)] 97.5 F (36.4 C) (05/10 0618) Pulse Rate:  [107-130] 107  (05/10 0618) Resp:  [22-31] 24  (05/10 0833) BP: (103-137)/(80-84) 103/84 mmHg (05/10 0618) SpO2:  [90 %-98 %] 93 % (05/10 0833) Last BM Date: 12/11/11  Afebrile, HR up yesterday and some SOB, DCHF  Yesterday PCO2 52 PO2 57, BNP 2747   K+ 3.5,  Intake/Output from previous day: 05/09 0701 - 05/10 0700 In: 1874.3 [P.O.:840; I.V.:980.3; IV Piggyback:54] Out: 4 [Urine:2; Stool:2] Intake/Output this shift: Total I/O In: 360 [P.O.:360] Out: -   General appearance: alert, cooperative and major effort moving in bed. Resp: BS down each base GI: Few BS, wound vac in place  Lab Results:   Basename 12/11/11 0530 12/10/11 1305  WBC 12.4* 11.6*  HGB 10.7* 11.0*  HCT 34.1* 35.2*  PLT 226 220    BMET  Basename 12/11/11 0530 12/10/11 1305  NA 143 142  K 3.5 3.9  CL 109 110  CO2 30 27  GLUCOSE 112* 125*  BUN 9 7  CREATININE 0.71 0.64  CALCIUM 9.4 9.9   PT/INR No results found for this basename: LABPROT:2,INR:2 in the last 72 hours   Lab 12/05/11 0150  AST 18  ALT 11  ALKPHOS 79  BILITOT 0.8  PROT 5.8*  ALBUMIN 2.6*     Lipase  No results found for this basename: lipase     Studies/Results: Dg Abd 1 View  12/10/2011  *RADIOLOGY REPORT*  Clinical Data: Hernia repair 5 days ago, now with severe abdominal pain.  PORTABLE ABDOMEN - 1 VIEW 12/10/2011 1304 hours:  Comparison: Portable abdomen x-ray 12/04/2011.  CT abdomen pelvis 12/03/2011.  Findings: Recurrent moderate to severe dilation of multiple loops of small bowel throughout the abdomen.  Mild gaseous distention of the stomach.  Gas within upper normal caliber ascending colon. Descending colon decompressed.  No suggestion of free air on the supine image.  Surgical clips in the right side of the abdomen. Degenerative  changes involving the lumbar spine.  Left UPJ calculus identified on the CT not visualized on the current examination.  IMPRESSION: Severe generalized ileus is favored over recurrent small bowel obstruction.  Original Report Authenticated By: Arnell Sieving, M.D.   Dg Chest Port 1 View  12/10/2011  *RADIOLOGY REPORT*  Clinical Data: History of asthma, presenting now with increased work of breathing and diminished breath sounds at the lung bases.  PORTABLE CHEST - 1 VIEW 12/10/2011 1315 hours:  Comparison: Portable chest x-rays 12/06/2011, 12/04/2011, 08/19/2011, and two-view chest x-ray 08/11/2011.  Findings: Suboptimal inspiration due to body habitus.  Cardiac silhouette enlarged but stable.  Thoracic aorta tortuous atherosclerotic, unchanged.  Stable chronic elevation of the right hemidiaphragm and associated chronic atelectasis/scarring at the right base.  No new pulmonary parenchymal abnormalities.  Pulmonary vascularity normal.  Right jugular central venous catheter tip in the lower SVC.  IMPRESSION: Suboptimal inspiration.  Stable cardiomegaly.  Chronic elevation of the right hemidiaphragm with chronic scar/atelectasis at the right base.  No acute cardiopulmonary disease.  Original Report Authenticated By: Arnell Sieving, M.D.    Medications:    . antiseptic oral rinse  15 mL Mouth Rinse BID  . ertapenem (INVANZ) IV  1 g Intravenous Q24H  . feeding supplement  30 mL Oral QID  . feeding supplement  1 Container Oral QID  . furosemide  40 mg Intravenous Q12H  . heparin  5,000 Units Subcutaneous Q8H  . metoprolol  2.5 mg Intravenous Q8H  . morphine   Intravenous Q4H  . pantoprazole (PROTONIX) IV  40 mg Intravenous QHS  . 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  Advance diet slowly.    LOS: 8 days    Tina Chase 12/11/2011

## 2011-12-11 NOTE — Progress Notes (Signed)
Physical Therapy Treatment Patient Details Name: Tina Chase MRN: [id number] DOB: Sep 11, 1940 Today's Date: 12/11/2011 Time: 4098-1191 PT Time Calculation (min): 38 min  PT Assessment / Plan / Recommendation Comments on Treatment Session  Pt very fatigued upon therapy arrival today.  Pt agreed to transfer to/from 3in1/bed, however deferred ambulation due to increased fatigue and increased HR/RR.  Notified RN of vitals during mobility.  Discussed short term SNF stay with pt to increase strength for a safe D/C , however pt stated that she would like to discuss it further with daughterr.      Follow Up Recommendations  Home health PT;Skilled nursing facility    Barriers to Discharge        Equipment Recommendations  3 in 1 bedside comode;Other (comment) (3in1 if home)    Recommendations for Other Services    Frequency Min 3X/week   Plan Discharge plan needs to be updated    Precautions / Restrictions Precautions Precautions: Fall Restrictions Weight Bearing Restrictions: No   Pertinent Vitals/Pain Pt states she still has pain in abdomen    Mobility  Bed Mobility Bed Mobility: Supine to Sit;Sit to Supine;Sitting - Scoot to Edge of Bed Supine to Sit: 1: +2 Total assist;HOB elevated;With rails Supine to Sit: Patient Percentage: 30% Sitting - Scoot to Edge of Bed: 1: +2 Total assist Sitting - Scoot to Edge of Bed: Patient Percentage: 30% Sit to Supine: 1: +2 Total assist;HOB elevated Sit to Supine: Patient Percentage: 20% Details for Bed Mobility Assistance: Requires increased assist during session for B LE onto and off of bed, as well as for trunk to attain sitting and for controlled descent into bed.  Cues for hand placement to self assist trunk.   Transfers Transfers: Sit to Stand;Stand to Sit;Stand Pivot Transfers Sit to Stand: 1: +2 Total assist;From elevated surface;With upper extremity assist;From bed;With armrests;From chair/3-in-1 Sit to Stand: Patient Percentage:  60% Stand to Sit: 1: +2 Total assist;With upper extremity assist;With armrests;To bed;To chair/3-in-1 Stand to Sit: Patient Percentage: 80% Stand Pivot Transfers: 1: +2 Total assist Stand Pivot Transfers: Patient Percentage: 70% Details for Transfer Assistance: Assist to stabalize trunk when standing and during transfers.  Cues for hand placement, upright posture and for controlled descent/safety. Deferred ambulation due to pt very fatigued and RR/HR increased during activity.   Ambulation/Gait Assistive device: Rolling walker Gait Pattern: Step-to pattern;Trunk flexed;Shuffle Stairs: No Wheelchair Mobility Wheelchair Mobility: No    Exercises     PT Diagnosis:    PT Problem List:   PT Treatment Interventions:     PT Goals Acute Rehab PT Goals PT Goal Formulation: With patient Time For Goal Achievement: 12/23/11 Potential to Achieve Goals: Good Pt will go Supine/Side to Sit: with modified independence PT Goal: Supine/Side to Sit - Progress: Not progressing (due to fatigue) Pt will go Sit to Supine/Side: with modified independence PT Goal: Sit to Supine/Side - Progress: Not progressing (due to fatigue) Pt will go Sit to Stand: with supervision PT Goal: Sit to Stand - Progress: Progressing toward goal Pt will go Stand to Sit: with supervision PT Goal: Stand to Sit - Progress: Progressing toward goal Pt will Transfer Bed to Chair/Chair to Bed: with supervision PT Transfer Goal: Bed to Chair/Chair to Bed - Progress: Progressing toward goal  Visit Information  Last PT Received On: 12/11/11 Assistance Needed: +2 PT/OT Co-Evaluation/Treatment: Yes    Subjective Data  Subjective: "This is harder than it was" Patient Stated Goal: to go home   Cognition  Overall  Cognitive Status: Appears within functional limits for tasks assessed/performed Arousal/Alertness: Awake/alert Orientation Level: Appears intact for tasks assessed Behavior During Session: [facility] for tasks performed     Balance     End of Session PT - End of Session Activity Tolerance: Patient limited by fatigue;Patient limited by pain Patient left: in bed;with call bell/phone within reach Nurse Communication: Mobility status    Page, Meribeth Mattes 12/11/2011, 12:16 PM

## 2011-12-11 NOTE — Progress Notes (Signed)
Occupational Therapy Treatment Patient Details Name: NAVIA LINDAHL MRN: [id number] DOB: 1940/08/14 Today's Date: 12/11/2011 Time: 1191-4782 OT Time Calculation (min): 30 min  OT Assessment / Plan / Recommendation Comments on Treatment Session pt limited by pain. Unable to reach posterior periarea for toilet hygiene due to pain. Discussed toilet aid option. Add toilet hygiene goal.     Follow Up Recommendations  Feel pt will likely need Skilled nursing facility; pt would like to talk to her daughter before looking into SNF options further. If pt not agreeable to SNF will need 24/7 assist and HHOT.   Barriers to Discharge       Equipment Recommendations  3 in 1 bedside comode;Other (comment) (3in1 if home)    Recommendations for Other Services    Frequency Min 2X/week   Plan Discharge plan remains appropriate    Precautions / Restrictions Precautions Precautions: Fall Restrictions Weight Bearing Restrictions: No        ADL  Toilet Transfer: Performed;+2 Total assistance;Comment for patient %;Other (comment) (pt 70% for pivot to [facility]. Sit to stand 60%. stand to sit 80%) Toilet Transfer Method: Stand pivot;Other (comment) (with bariatric RW) Toilet Transfer Equipment: Bedside commode Toileting - Clothing Manipulation: Simulated;+2 Total assistance;Comment for patient %;Other (comment) (pt 40% with gown) Where Assessed - Toileting Clothing Manipulation: Standing Toileting - Hygiene: Performed;+2 Total assistance;Comment for patient %;Other (comment) (pt 0% attempted to reach posterior periarea but unable ) Where Assessed - Toileting Hygiene: Standing ADL Comments: cotx with PT. Transferred to [facility]. Pt didnt want to sit up in chair after BSC. States she has had a lot going on this morning and wants to return to bed. Discussed SNF option with pt and recommended this to her. Pt requests to talk to daughter first.     OT Diagnosis:    OT Problem List:   OT Treatment Interventions:      OT Goals ADL Goals ADL Goal: Toilet Transfer - Progress: Progressing toward goals ADL Goal: Toileting - Clothing Manipulation - Progress: Progressing toward goals Pt Will Perform Toileting - Hygiene: with min assist;with adaptive equipment ADL Goal: Toileting - Hygiene - Progress: Goal set today  Visit Information  Last OT Received On: 12/11/11 Assistance Needed: +2 PT/OT Co-Evaluation/Treatment: Yes    Subjective Data  Subjective: I feel like I am getting weaker Patient Stated Goal: wants to return home but will talk to her daughter about possibly going SNF   Prior Functioning       Cognition  Overall Cognitive Status: Appears within functional limits for tasks assessed/performed Arousal/Alertness: Awake/alert Orientation Level: Appears intact for tasks assessed Behavior During Session: [facility] for tasks performed    Mobility Bed Mobility Bed Mobility: Supine to Sit;Sit to Supine;Sitting - Scoot to Edge of Bed Supine to Sit: 1: +2 Total assist;HOB elevated;With rails Supine to Sit: Patient Percentage: 30% Sitting - Scoot to Edge of Bed: 1: +2 Total assist Sitting - Scoot to Edge of Bed: Patient Percentage: 30% Sit to Supine: 1: +2 Total assist;HOB elevated Sit to Supine: Patient Percentage: 20% Details for Bed Mobility Assistance: Requires increased assist during session for B LE onto and off of bed, as well as for trunk to attain sitting and for controlled descent into bed.  Cues for hand placement to self assist trunk.   Transfers Sit to Stand: 1: +2 Total assist;From elevated surface;With upper extremity assist;From bed;With armrests;From chair/3-in-1 Sit to Stand: Patient Percentage: 60% Stand to Sit: 1: +2 Total assist;With upper extremity assist;With armrests;To bed;To chair/3-in-1  Stand to Sit: Patient Percentage: 80% Details for Transfer Assistance: Assist to stabalize trunk when standing and during transfers.  Cues for hand placement, upright posture and for  controlled descent/safety. Deferred ambulation due to pt very fatigued and RR/HR increased during activity.     Exercises    Balance    End of Session OT - End of Session Activity Tolerance: Patient limited by pain Patient left: in bed;with call bell/phone within reach   Shalika, Arntz [phone] 12/11/2011, 12:11 PM

## 2011-12-12 DIAGNOSIS — I5021 Acute systolic (congestive) heart failure: Secondary | ICD-10-CM

## 2011-12-12 DIAGNOSIS — E213 Hyperparathyroidism, unspecified: Secondary | ICD-10-CM

## 2011-12-12 DIAGNOSIS — K56609 Unspecified intestinal obstruction, unspecified as to partial versus complete obstruction: Secondary | ICD-10-CM

## 2011-12-12 DIAGNOSIS — D7289 Other specified disorders of white blood cells: Secondary | ICD-10-CM

## 2011-12-12 LAB — CBC
Platelets: 249 10*3/uL (ref 150–400)
RBC: 3.46 MIL/uL — ABNORMAL LOW (ref 3.87–5.11)
RDW: 14.5 % (ref 11.5–15.5)
WBC: 13.5 10*3/uL — ABNORMAL HIGH (ref 4.0–10.5)

## 2011-12-12 MED ORDER — LOPERAMIDE HCL 2 MG PO CAPS
2.0000 mg | ORAL_CAPSULE | ORAL | Status: DC | PRN
  Administered 2011-12-12 – 2011-12-19 (×8): 2 mg via ORAL
  Filled 2011-12-12 (×8): qty 1

## 2011-12-12 NOTE — Progress Notes (Signed)
Subjective: Patient still continues to have some loose stool. No fevers. Breathing much more comfortably today.  Objective: Weight change:   Intake/Output Summary (Last 24 hours) at 12/12/11 1309 Last data filed at 12/12/11 0900  Gross per 24 hour  Intake    880 ml  Output   1100 ml  Net   -220 ml    Filed Vitals:   12/12/11 0500  BP: 109/76  Pulse: 107  Temp: 98.9 F (37.2 C)  Resp: 20   General: Alert and oriented x3, fatigue HEENT: Normocephalic, atraumatic, mucous membranes are moist Cardiovascular: Regular rhythm, borderline tachycardia Lungs: Decreased breath sounds throughout secondary to body habitus, otherwise breathing comfortably no wheezing or crackles. Abdomen: Soft, morbidly obese, hypoactive bowel sounds, nontender, wound VAC in place Extremities: No clubbing or cyanosis, trace pitting edema  Lab Results: Basic Metabolic Panel:  Basename 12/11/11 0530 12/10/11 1305  NA 143 142  K 3.5 3.9  CL 109 110  CO2 30 27  GLUCOSE 112* 125*  BUN 9 7  CREATININE 0.71 0.64  CALCIUM 9.4 9.9  MG -- --  PHOS -- --   CBC:  Basename 12/12/11 0410 12/11/11 0530  WBC 13.5* 12.4*  NEUTROABS -- --  HGB 9.9* 10.7*  HCT 31.9* 34.1*  MCV 92.2 93.9  PLT 249 226    Medications: Scheduled Meds:    . antiseptic oral rinse  15 mL Mouth Rinse BID  . ertapenem (INVANZ) IV  1 g Intravenous Q24H  . feeding supplement  30 mL Oral QID  . feeding supplement  1 Container Oral QID  . furosemide  40 mg Oral BID  . heparin  5,000 Units Subcutaneous Q8H  . metoprolol tartrate  25 mg Oral BID  . pantoprazole  40 mg Oral Q1200  . DISCONTD: furosemide  40 mg Intravenous Q12H  . DISCONTD: metoprolol  2.5 mg Intravenous Q8H  . DISCONTD: morphine   Intravenous Q4H  . DISCONTD: pantoprazole (PROTONIX) IV  40 mg Intravenous QHS   Continuous Infusions:   PRN Meds:.HYDROcodone-acetaminophen, levalbuterol, loperamide, metoprolol, morphine injection, ondansetron (ZOFRAN) IV,  ondansetron, oxyCODONE-acetaminophen, sodium chloride, DISCONTD: diphenhydrAMINE, DISCONTD: diphenhydrAMINE, DISCONTD: naloxone, DISCONTD: ondansetron (ZOFRAN) IV, DISCONTD: sodium chloride  Assessment/Plan: Patient Active Hospital Problem List: BNP elevated at 5500. Patient has a history of grade 1 diastolic dysfunction and likely with volume resuscitation, she is now acute diastolic heart failure. Have started diuretics and following intake and output. Unable to get good output readings so we placed a Foley catheter.  Leukocytosis: Still concern about possibility of infection his white blood cell count mildly continues to increase. No fever. C. difficile and urine and chest x-ray all unremarkable. Her central line was removed yesterday in exchange for PICC line but that was up to 7-8 days. I went ahead and ordered blood cultures and her white blood cell count could be from her ileus seen on x-ray the other day. If her white count is elevated by tomorrow she has a temperature, we'll go ahead and start antibiotics.  Small bowel obstruction due to strangulated incisional hernia s/p expl lap, small bowel resection, primary hernia repair 12/04/11 (12/04/2011)  followup films noted large ileus. Surgery managing. Diet advance to solids.    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    Plan: No signs of elevated calcium levels  HYPERLIPIDEMIA (08/13/2006)   Assessment: Stable.    Plan: Adjust diet as outpatient.  Asthma (12/04/2011)  this is to be stable. I do not think that her current respirations  are from asthma. See above.  Respiratory failure following trauma and surgery (12/05/2011)  initial respiratory failure is stable after surgery.  Tachycardia (12/05/2011)   Assessment: Started patient on scheduled beta blocker. Stable. Some of this may be also from pain and minimal hypoxia and deconditioning. Continue to monitor.   Diarrhea: Not C. difficile. Have started Imodium. This will  improve her she's put on solid food.  LOS: 9 days   Coraima Tibbs K 12/12/2011, 1:09 PM

## 2011-12-12 NOTE — Progress Notes (Signed)
Patient ID: Tina Chase, female   DOB: [birth year], 71 y.o.   MRN: [id number] 8 Days Post-Op  Subjective: Feels better, moving more easily.  Mild abdominal pain, improved. Tol regular diet, pos BM today  Objective: Vital signs in last 24 hours: Temp:  [97.5 F (36.4 C)-98.9 F (37.2 C)] 98.9 F (37.2 C) (05/11 0500) Pulse Rate:  [107-125] 107  (05/11 0500) Resp:  [20] 20  (05/11 0500) BP: (109-125)/(74-84) 109/76 mmHg (05/11 0500) SpO2:  [90 %] 90 % (05/11 0500) Weight:  [315 lb 4.1 oz (143 kg)] 315 lb 4.1 oz (143 kg) (05/11 0500) Last BM Date: 12/11/11  Intake/Output from previous day: 05/10 0701 - 05/11 0700 In: 1000 [P.O.:600; I.V.:240] Out: 1100 [Urine:1100] Intake/Output this shift: Total I/O In: 240 [P.O.:240] Out: -   General appearance: alert, no distress and morbidly obese GI: normal findings: soft, non-tender Incision/Wound: VAC dressings intact and clean  Lab Results:   Basename 12/12/11 0410 12/11/11 0530  WBC 13.5* 12.4*  HGB 9.9* 10.7*  HCT 31.9* 34.1*  PLT 249 226   BMET  Basename 12/11/11 0530 12/10/11 1305  NA 143 142  K 3.5 3.9  CL 109 110  CO2 30 27  GLUCOSE 112* 125*  BUN 9 7  CREATININE 0.71 0.64  CALCIUM 9.4 9.9     Studies/Results: No results found.  Anti-infectives: Anti-infectives     Start     Dose/Rate Route Frequency Ordered Stop   12/05/11 1800   ertapenem (INVANZ) 1 g in sodium chloride 0.9 % 50 mL IVPB        1 g 100 mL/hr over 30 Minutes Intravenous Every 24 hours 12/04/11 2335     12/04/11 1000   ertapenem (INVANZ) 1 g in sodium chloride 0.9 % 50 mL IVPB  Status:  Discontinued        1 g 100 mL/hr over 30 Minutes Intravenous Every 24 hours 12/04/11 0806 12/04/11 2224          Assessment/Plan: s/p Procedure(s): EXPLORATORY LAPAROTOMY HERNIA REPAIR INCISIONAL APPLICATION OF WOUND VAC Slight increase WBC but overall seems better and wounds and abdomen look OK   LOS: 9 days    Kirandeep Fariss  T 12/12/2011

## 2011-12-13 DIAGNOSIS — R197 Diarrhea, unspecified: Secondary | ICD-10-CM

## 2011-12-13 DIAGNOSIS — K56609 Unspecified intestinal obstruction, unspecified as to partial versus complete obstruction: Secondary | ICD-10-CM

## 2011-12-13 DIAGNOSIS — D7289 Other specified disorders of white blood cells: Secondary | ICD-10-CM

## 2011-12-13 DIAGNOSIS — I5021 Acute systolic (congestive) heart failure: Secondary | ICD-10-CM

## 2011-12-13 LAB — CBC
MCV: 90.2 fL (ref 78.0–100.0)
Platelets: 295 10*3/uL (ref 150–400)
RBC: 3.36 MIL/uL — ABNORMAL LOW (ref 3.87–5.11)
WBC: 11.3 10*3/uL — ABNORMAL HIGH (ref 4.0–10.5)

## 2011-12-13 LAB — DIFFERENTIAL
Eosinophils Relative: 3 % (ref 0–5)
Lymphocytes Relative: 23 % (ref 12–46)
Lymphs Abs: 2.6 10*3/uL (ref 0.7–4.0)

## 2011-12-13 NOTE — Progress Notes (Signed)
Subjective: Patient doing markedly better today he responded to Imodium. Ambulating which is Dilantin which causes her shortness of breath. Tolerating solid food.  Objective: Weight change: -6 kg (-13 lb 3.6 oz)  Intake/Output Summary (Last 24 hours) at 12/13/11 1511 Last data filed at 12/13/11 0852  Gross per 24 hour  Intake    220 ml  Output   2725 ml  Net  -2505 ml    Filed Vitals:   12/13/11 0603  BP: 115/74  Pulse: 102  Temp: 98.4 F (36.9 C)  Resp: 20   General: Alert and oriented x3, fatigue HEENT: Normocephalic, atraumatic, mucous membranes are moist Cardiovascular: Regular rhythm, borderline tachycardia Lungs: Decreased breath sounds throughout secondary to body habitus, otherwise breathing comfortably no wheezing or crackles. Abdomen: Soft, morbidly obese, hypoactive bowel sounds, nontender, wound VAC in place Extremities: No clubbing or cyanosis, trace pitting edema  Lab Results: Basic Metabolic Panel:  Basename 12/11/11 0530  NA 143  K 3.5  CL 109  CO2 30  GLUCOSE 112*  BUN 9  CREATININE 0.71  CALCIUM 9.4  MG --  PHOS --   CBC:  Basename 12/13/11 0440 12/12/11 0410  WBC 11.3* 13.5*  NEUTROABS 6.8 --  HGB 9.7* 9.9*  HCT 30.3* 31.9*  MCV 90.2 92.2  PLT 295 249    Medications: Scheduled Meds:    . antiseptic oral rinse  15 mL Mouth Rinse BID  . ertapenem (INVANZ) IV  1 g Intravenous Q24H  . feeding supplement  30 mL Oral QID  . feeding supplement  1 Container Oral QID  . furosemide  40 mg Oral BID  . heparin  5,000 Units Subcutaneous Q8H  . metoprolol tartrate  25 mg Oral BID  . pantoprazole  40 mg Oral Q1200   Continuous Infusions:   PRN Meds:.HYDROcodone-acetaminophen, levalbuterol, loperamide, metoprolol, morphine injection, ondansetron (ZOFRAN) IV, ondansetron, oxyCODONE-acetaminophen, sodium chloride  Assessment/Plan: Patient Active Hospital Problem List: BNP elevated at 5500. Patient has a history of grade 1 diastolic dysfunction  and likely with volume resuscitation, she is now acute diastolic heart failure. Have started diuretics and following intake and output. Unable to get good output readings so we placed a Foley catheter. She diuresed 3 L yesterday. We'll check a followup BNP in the morning.  Leukocytosis: Improved today on its own. Likely some mild stress margination from persistent ileus which seems to resolved. No signs of acute infection. Recheck in the morning.  Small bowel obstruction due to strangulated incisional hernia s/p expl lap, small bowel resection, primary hernia repair 12/04/11 (12/04/2011)  followup films noted large ileus. Surgery managing. Tolerating solids.    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    Plan: No signs of elevated calcium levels  HYPERLIPIDEMIA (08/13/2006)   Assessment: Stable.    Plan: Adjust diet as outpatient.  Asthma (12/04/2011)  this is to be stable. I do not think that her current respirations are from asthma. See above.  Respiratory failure following trauma and surgery (12/05/2011)  initial respiratory failure is stable after surgery.  Tachycardia (12/05/2011)   Assessment: Started patient on scheduled beta blocker. Stable. Some of this may be also from pain and minimal hypoxia and deconditioning. Continue to monitor. Most likely from deconditioning.  Diarrhea: Not C. difficile. Have started Imodium. This will improve her she's put on solid food.  Disposition: I requested social work for short-term rehabilitation skilled nursing. Patient states she will think about it but understands how weak that she is.  LOS: 10 days  Revin Corker K 12/13/2011, 3:11 PM

## 2011-12-13 NOTE — Progress Notes (Signed)
Patient ID: EVALEEN SANT, female   DOB: Aug 26, 1940, 71 y.o.   MRN: [id number] 9 Days Post-Op  Subjective: No major complaints. She feels she is making progress. Tolerating a regular diet. She's having some loose stools controlled with Imodium. She got up and walked around the room yesterday.  Objective: Vital signs in last 24 hours: Temp:  [97.8 F (36.6 C)-99.8 F (37.7 C)] 98.4 F (36.9 C) (05/12 0603) Pulse Rate:  [102-115] 102  (05/12 0603) Resp:  [20-21] 20  (05/12 0603) BP: (115-124)/(74-81) 115/74 mmHg (05/12 0603) SpO2:  [90 %-94 %] 90 % (05/12 0603) Weight:  [302 lb 0.5 oz (137 kg)] 302 lb 0.5 oz (137 kg) (05/12 0603) Last BM Date: 12/13/11  Intake/Output from previous day: 05/11 0701 - 05/12 0700 In: 460 [P.O.:460] Out: 3925 [Urine:3125; Drains:800] Intake/Output this shift: Total I/O In: 120 [P.O.:120] Out: -   General appearance: alert, no distress and morbidly obese GI: normal findings: soft, non-tender Incision/Wound: VAC dressings in place and intact  Lab Results:   Basename 12/13/11 0440 12/12/11 0410  WBC 11.3* 13.5*  HGB 9.7* 9.9*  HCT 30.3* 31.9*  PLT 295 249   BMET  Basename 12/11/11 0530 12/10/11 1305  NA 143 142  K 3.5 3.9  CL 109 110  CO2 30 27  GLUCOSE 112* 125*  BUN 9 7  CREATININE 0.71 0.64  CALCIUM 9.4 9.9     Studies/Results: No results found.  Anti-infectives: Anti-infectives     Start     Dose/Rate Route Frequency Ordered Stop   12/05/11 1800   ertapenem (INVANZ) 1 g in sodium chloride 0.9 % 50 mL IVPB        1 g 100 mL/hr over 30 Minutes Intravenous Every 24 hours 12/04/11 2335     12/04/11 1000   ertapenem (INVANZ) 1 g in sodium chloride 0.9 % 50 mL IVPB  Status:  Discontinued        1 g 100 mL/hr over 30 Minutes Intravenous Every 24 hours 12/04/11 0806 12/04/11 2224          Assessment/Plan: s/p Procedure(s): EXPLORATORY LAPAROTOMY HERNIA REPAIR INCISIONAL APPLICATION OF WOUND VAC Doing well. No  complications identified. White blood count trending down.   LOS: 10 days    Kupono Marling T 12/13/2011

## 2011-12-14 DIAGNOSIS — K56609 Unspecified intestinal obstruction, unspecified as to partial versus complete obstruction: Secondary | ICD-10-CM

## 2011-12-14 DIAGNOSIS — D7289 Other specified disorders of white blood cells: Secondary | ICD-10-CM

## 2011-12-14 DIAGNOSIS — I5031 Acute diastolic (congestive) heart failure: Secondary | ICD-10-CM

## 2011-12-14 DIAGNOSIS — R197 Diarrhea, unspecified: Secondary | ICD-10-CM

## 2011-12-14 LAB — BASIC METABOLIC PANEL
CO2: 35 mEq/L — ABNORMAL HIGH (ref 19–32)
Chloride: 101 mEq/L (ref 96–112)
Creatinine, Ser: 0.68 mg/dL (ref 0.50–1.10)
Potassium: 2.4 mEq/L — CL (ref 3.5–5.1)
Sodium: 142 mEq/L (ref 135–145)

## 2011-12-14 LAB — CBC
HCT: 29.3 % — ABNORMAL LOW (ref 36.0–46.0)
Hemoglobin: 9.6 g/dL — ABNORMAL LOW (ref 12.0–15.0)
MCV: 90.7 fL (ref 78.0–100.0)
RBC: 3.23 MIL/uL — ABNORMAL LOW (ref 3.87–5.11)
WBC: 11.5 10*3/uL — ABNORMAL HIGH (ref 4.0–10.5)

## 2011-12-14 LAB — PRO B NATRIURETIC PEPTIDE: Pro B Natriuretic peptide (BNP): 2616 pg/mL — ABNORMAL HIGH (ref 0–125)

## 2011-12-14 MED ORDER — SODIUM CHLORIDE 0.9 % IV SOLN
1.0000 g | INTRAVENOUS | Status: DC
  Administered 2011-12-14 – 2011-12-17 (×3): 1 g via INTRAVENOUS
  Filled 2011-12-14 (×5): qty 1

## 2011-12-14 MED ORDER — FUROSEMIDE 80 MG PO TABS
80.0000 mg | ORAL_TABLET | Freq: Two times a day (BID) | ORAL | Status: DC
  Administered 2011-12-14 – 2011-12-22 (×16): 80 mg via ORAL
  Filled 2011-12-14 (×18): qty 1

## 2011-12-14 MED ORDER — POTASSIUM CHLORIDE CRYS ER 20 MEQ PO TBCR
40.0000 meq | EXTENDED_RELEASE_TABLET | Freq: Three times a day (TID) | ORAL | Status: DC
  Administered 2011-12-14 – 2011-12-23 (×36): 40 meq via ORAL
  Filled 2011-12-14 (×43): qty 2

## 2011-12-14 MED ORDER — POTASSIUM CHLORIDE CRYS ER 20 MEQ PO TBCR
40.0000 meq | EXTENDED_RELEASE_TABLET | Freq: Once | ORAL | Status: AC
  Administered 2011-12-14: 40 meq via ORAL
  Filled 2011-12-14: qty 2

## 2011-12-14 MED ORDER — POTASSIUM CHLORIDE 10 MEQ/100ML IV SOLN
10.0000 meq | INTRAVENOUS | Status: AC
  Administered 2011-12-14 (×6): 10 meq via INTRAVENOUS
  Filled 2011-12-14 (×6): qty 100

## 2011-12-14 NOTE — Progress Notes (Signed)
Subjective: Patie patient herself doing okay. Still some diarrhea, but has responded to Imodium. When she ambulates with physical therapy, she still gets somewhat winded.  Objective: Weight change:   Intake/Output Summary (Last 24 hours) at 12/14/11 1721 Last data filed at 12/14/11 1700  Gross per 24 hour  Intake   1320 ml  Output   7454 ml  Net  -6134 ml    Filed Vitals:   12/14/11 1411  BP: 110/74  Pulse: 97  Temp: 97.6 F (36.4 C)  Resp: 20   General: Alert and oriented x3, fatigue HEENT: Normocephalic, atraumatic, mucous membranes are moist Cardiovascular: Regular rhythm, borderline tachycardia Lungs: Decreased breath sounds throughout secondary to body habitus, otherwise breathing comfortably no wheezing or crackles. Abdomen: Soft, morbidly obese, hypoactive bowel sounds, nontender, wound VAC in place Extremities: No clubbing or cyanosis, trace pitting edema  Lab Results: Basic Metabolic Panel:  Basename 12/14/11 0315  NA 142  K 2.4*  CL 101  CO2 35*  GLUCOSE 105*  BUN 10  CREATININE 0.68  CALCIUM 8.4  MG 1.4*  PHOS --   CBC:  Basename 12/14/11 0315 12/13/11 0440  WBC 11.5* 11.3*  NEUTROABS -- 6.8  HGB 9.6* 9.7*  HCT 29.3* 30.3*  MCV 90.7 90.2  PLT 275 295    Medications: Scheduled Meds:    . antiseptic oral rinse  15 mL Mouth Rinse BID  . feeding supplement  30 mL Oral QID  . feeding supplement  1 Container Oral QID  . furosemide  80 mg Oral BID  . heparin  5,000 Units Subcutaneous Q8H  . metoprolol tartrate  25 mg Oral BID  . pantoprazole  40 mg Oral Q1200  . potassium chloride  10 mEq Intravenous Q1 Hr x 6  . potassium chloride  40 mEq Oral Once  . potassium chloride  40 mEq Oral TID AC & HS  . DISCONTD: ertapenem (INVANZ) IV  1 g Intravenous Q24H  . DISCONTD: furosemide  40 mg Oral BID   Continuous Infusions:   PRN Meds:.HYDROcodone-acetaminophen, levalbuterol, loperamide, metoprolol, morphine injection, ondansetron (ZOFRAN) IV,  ondansetron, oxyCODONE-acetaminophen, sodium chloride  Assessment/Plan: Patient Active Hospital Problem List: Acute on chronic diastolic heart failure. Patient has a history of grade 1 diastolic dysfunction and likely with volume resuscitation, she is now acute diastolic heart failure. Have started diuretics and following intake and output. Unable to get good output readings so we placed a Foley catheter. Patient diuresed an additional several liters yesterday. BNP still elevated greater than 5000 today. Have increased her Lasix to 80 mg by mouth every 12 hours.  Leukocytosis: Near normalized. Initially was elevated likely to stress margination from persistent ileus.  Small bowel obstruction due to strangulated incisional hernia s/p expl lap, small bowel resection, primary hernia repair 12/04/11 (12/04/2011)  followup films noted large ileus. Surgery managing. Tolerating solids.    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    Plan: No signs of elevated calcium levels  HYPERLIPIDEMIA (08/13/2006)   Assessment: Stable.    Plan: Adjust diet as outpatient.  Asthma (12/04/2011)  this is to be stable. I do not think that her current respirations are from asthma. See above.  Respiratory failure following trauma and surgery (12/05/2011)  initial respiratory failure is stable after surgery.  Tachycardia (12/05/2011)   Assessment: Started patient on scheduled beta blocker. Stable. Some of this may be also from pain and minimal hypoxia and deconditioning. Continue to monitor. Most likely from deconditioning.  Diarrhea: Not C. difficile. Have  started Imodium. This will improve her she's put on solid food.  Disposition: Need short-term skilled nursing.    LOS: 11 days   Rohnan Bartleson K 12/14/2011, 5:21 PM

## 2011-12-14 NOTE — Progress Notes (Signed)
Physical Therapy Treatment Patient Details Name: Tina Chase MRN: [id number] DOB: [birth year] Today's Date: 12/14/2011 Time: 4098-1191 PT Time Calculation (min): 25 min  PT Assessment / Plan / Recommendation Comments on Treatment Session  Pt already OOB in recliner.  Assisted pt to bathroom 2nd cont loose stools.  Amb in hallway twice with one sitting rest break.  Pt understands she needs to D/C to SNF, she hopes she won't be there long.    Follow Up Recommendations  Home health PT;Skilled nursing facility............. depending on progress here   Barriers to Discharge        Equipment Recommendations       Recommendations for Other Services    Frequency Min 3X/week   Plan Discharge plan remains appropriate    Precautions / Restrictions Precautions Precautions: Fall Restrictions Weight Bearing Restrictions: No   Pertinent Vitals/Pain     Mobility  Transfers Transfers: Sit to Stand;Stand to Sit Sit to Stand: 1: +2 Total assist;From chair/3-in-1;From toilet Sit to Stand: Patient Percentage: 80% Stand to Sit: 1: +2 Total assist;To chair/3-in-1;To toilet Stand to Sit: Patient Percentage: 80% Details for Transfer Assistance: pt prefers to use momentum, shift weight forward, then rock self up to standing vs push up from behind Ambulation/Gait Ambulation/Gait Assistance: 1: +2 Total assist Ambulation/Gait: Patient Percentage: 90% Ambulation Distance (Feet): 275 Feet (15' to BR, 150' in hallway, 110' in hallway) Assistive device: Rolling walker Ambulation/Gait Assistance Details: + 2 assist for safety and equipment(IV, 2 wound vacs, chair).  X 3 sitting rest breaks 2nd fatigue and c/o weak legs. No c/o pain, just "soreness when I get u Gait Pattern: Step-to pattern;Shuffle;Trunk flexed Gait velocity: Pt demonstartes 2/4 DOE.  RA with amb 94% Stairs: No Wheelchair Mobility Wheelchair Mobility: No    PT Goals Acute Rehab PT Goals PT Goal Formulation: With patient Potential  to Achieve Goals: Good Pt will go Supine/Side to Sit: with modified independence PT Goal: Supine/Side to Sit - Progress: Progressing toward goal Pt will go Sit to Supine/Side: with modified independence PT Goal: Sit to Supine/Side - Progress: Progressing toward goal Pt will go Sit to Stand: with supervision PT Goal: Sit to Stand - Progress: Progressing toward goal Pt will go Stand to Sit: with supervision PT Goal: Stand to Sit - Progress: Progressing toward goal Pt will Transfer Bed to Chair/Chair to Bed: with supervision PT Transfer Goal: Bed to Chair/Chair to Bed - Progress: Progressing toward goal Pt will Ambulate: 51 - 150 feet;with supervision;with rolling walker PT Goal: Ambulate - Progress: Progressing toward goal  Visit Information  Last PT Received On: 12/14/11 Assistance Needed: +2 (equipment, IV, wound vacs, chair)    Subjective Data  Subjective: My legs feel week Patient Stated Goal: to go home   Cognition  Overall Cognitive Status: Appears within functional limits for tasks assessed/performed Arousal/Alertness: Awake/alert Orientation Level: Appears intact for tasks assessed Behavior During Session: [facility] for tasks performed Cognition - Other Comments: Pt pleasant and motivated    Balance   fair with RW  End of Session PT - End of Session Equipment Utilized During Treatment: Gait belt Activity Tolerance: Patient limited by fatigue Patient left: in chair;with call bell/phone within reach Nurse Communication: Mobility status   Felecia Shelling  PTA [facility]  Acute  Rehab Pager     [phone]

## 2011-12-14 NOTE — Progress Notes (Addendum)
10 Days Post-Op  Subjective: Up in chair, walked to BR, still has a foley in.  Having diarrhea, tolerating regular diet.  Objective: Vital signs in last 24 hours: Temp:  [97.7 F (36.5 C)-98 F (36.7 C)] 98 F (36.7 C) (05/13 0300) Pulse Rate:  [102-105] 104  (05/13 1004) Resp:  [19-20] 20  (05/13 0300) BP: (101-124)/(56-76) 124/76 mmHg (05/13 1004) SpO2:  [93 %-96 %] 96 % (05/13 0300) Weight:  [136.079 kg (300 lb)] 136.079 kg (300 lb) (05/13 0729) Last BM Date: 12/14/11  3 stools yesterday, 1 today, Afebrile, VSS, K+ 2.4 WBC 11.5.  Day 10 of Invanz, BNP 2616, C diff negative 5/10.  Intake/Output from previous day: 05/12 0701 - 05/13 0700 In: 460 [P.O.:360; IV Piggyback:100] Out: 7652 [Urine:7650; Stool:2] Intake/Output this shift: Total I/O In: 980 [P.O.:480; IV Piggyback:500] Out: 2 [Stool:2]  General appearance: alert, cooperative and no distress Resp: clear to auscultation bilaterally, SOB with decrease in Sats, with ambulation GI: soft, non-tender; bowel sounds normal; no masses,  no organomegaly and wound vacs in place  Lab Results:   Basename 12/14/11 0315 12/13/11 0440  WBC 11.5* 11.3*  HGB 9.6* 9.7*  HCT 29.3* 30.3*  PLT 275 295    BMET  Basename 12/14/11 0315  NA 142  K 2.4*  CL 101  CO2 35*  GLUCOSE 105*  BUN 10  CREATININE 0.68  CALCIUM 8.4   PT/INR No results found for this basename: LABPROT:2,INR:2 in the last 72 hours  No results found for this basename: AST:5,ALT:5,ALKPHOS:5,BILITOT:5,PROT:5,ALBUMIN:5 in the last 168 hours   Lipase  No results found for this basename: lipase     Studies/Results: No results found.  Medications:    . antiseptic oral rinse  15 mL Mouth Rinse BID  . ertapenem (INVANZ) IV  1 g Intravenous Q24H  . feeding supplement  30 mL Oral QID  . feeding supplement  1 Container Oral QID  . furosemide  40 mg Oral BID  . heparin  5,000 Units Subcutaneous Q8H  . metoprolol tartrate  25 mg Oral BID  . pantoprazole   40 mg Oral Q1200  . potassium chloride  10 mEq Intravenous Q1 Hr x 6  . 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 D/c foley and invanz, she has OT/PT, Check O@ sats when moving see if she still needs O2, replace K+ Consider restarting IMdur, oral lopressor,lasix, and other home meds as she tolerates them.  i will defer to medicine so we don't get things confused. Check magnesium today.     LOS: 11 days   dISCUSSED WITH dR. Rosenbower, plan to continue Invanz. Darlynn Ricco 12/14/2011

## 2011-12-14 NOTE — Progress Notes (Signed)
Patient seen and examined.  RLQ wound looks good with granulation tissue forming.  Midline wound has some slough and drainage.  Will continue VAC on RLQ wound and start NS damp to dry dressing changes on midline wound.

## 2011-12-15 DIAGNOSIS — D7289 Other specified disorders of white blood cells: Secondary | ICD-10-CM

## 2011-12-15 DIAGNOSIS — R197 Diarrhea, unspecified: Secondary | ICD-10-CM

## 2011-12-15 DIAGNOSIS — I5031 Acute diastolic (congestive) heart failure: Secondary | ICD-10-CM

## 2011-12-15 DIAGNOSIS — K56609 Unspecified intestinal obstruction, unspecified as to partial versus complete obstruction: Secondary | ICD-10-CM

## 2011-12-15 LAB — BASIC METABOLIC PANEL
BUN: 8 mg/dL (ref 6–23)
CO2: 32 mEq/L (ref 19–32)
Chloride: 99 mEq/L (ref 96–112)
Glucose, Bld: 111 mg/dL — ABNORMAL HIGH (ref 70–99)
Potassium: 3.1 mEq/L — ABNORMAL LOW (ref 3.5–5.1)

## 2011-12-15 LAB — CBC
HCT: 32.4 % — ABNORMAL LOW (ref 36.0–46.0)
Hemoglobin: 10.5 g/dL — ABNORMAL LOW (ref 12.0–15.0)
MCH: 29.5 pg (ref 26.0–34.0)
MCHC: 32.4 g/dL (ref 30.0–36.0)
RBC: 3.56 MIL/uL — ABNORMAL LOW (ref 3.87–5.11)

## 2011-12-15 LAB — URINALYSIS, ROUTINE W REFLEX MICROSCOPIC
Bilirubin Urine: NEGATIVE
Glucose, UA: NEGATIVE mg/dL
Ketones, ur: NEGATIVE mg/dL
Leukocytes, UA: NEGATIVE
Specific Gravity, Urine: 1.015 (ref 1.005–1.030)
pH: 5.5 (ref 5.0–8.0)

## 2011-12-15 LAB — URINE MICROSCOPIC-ADD ON

## 2011-12-15 MED ORDER — POTASSIUM CHLORIDE 10 MEQ/100ML IV SOLN
10.0000 meq | INTRAVENOUS | Status: AC
  Administered 2011-12-15 (×6): 10 meq via INTRAVENOUS
  Filled 2011-12-15 (×7): qty 100

## 2011-12-15 MED ORDER — PRO-STAT SUGAR FREE PO LIQD
30.0000 mL | Freq: Two times a day (BID) | ORAL | Status: DC
  Administered 2011-12-16 – 2011-12-23 (×13): 30 mL via ORAL
  Filled 2011-12-15 (×17): qty 30

## 2011-12-15 NOTE — Progress Notes (Signed)
Clinical Social Work Department BRIEF PSYCHOSOCIAL ASSESSMENT 12/15/2011  Patient:  Tina Chase, Tina Chase     Account Number:  [id number]     Admit date:  12/03/2011  Clinical Social Worker:  Tommi Emery, CLINICAL SOCIAL WORKER  Date/Time:  12/15/2011 08:50 AM  Referred by:  Physician  Date Referred:  12/14/2011 Referred for  SNF Placement   Other Referral:   Interview type:  Patient Other interview type:    PSYCHOSOCIAL DATA Living Status:  ALONE Admitted from facility:   Level of care:   Primary support name:  Melita Primary support relationship to patient:  CHILD, ADULT Degree of support available:   good    CURRENT CONCERNS Current Concerns  Adjustment to Illness   Other Concerns:   her family is far away, in [place] and in [place]. She has some concerns with walking and independance at the moment.    SOCIAL WORK ASSESSMENT / PLAN pt will be faxed out and skilled facilities will be looked into.   Assessment/plan status:  Psychosocial Support/Ongoing Assessment of Needs Other assessment/ plan:   Information/referral to community resources:    PATIENT'S/FAMILY'S RESPONSE TO PLAN OF CARE: pt is in aggreement to placement for rehab.    Kayleen Memos. Leighton Ruff [phone]

## 2011-12-15 NOTE — Progress Notes (Signed)
CSW spoke with the pt yesterday. The pt is okay with being faxed out.  Kayleen Memos. Leighton Ruff [phone]

## 2011-12-15 NOTE — Plan of Care (Signed)
Problem: Inadequate Intake (NI-2.1) Goal: Food and/or nutrient delivery Individualized approach for food/nutrient provision.  Outcome: Progressing Diet has been advanced to Regular diet with PO intake 100% with positive tolerance.

## 2011-12-15 NOTE — Progress Notes (Signed)
Nutrition Follow-up  Diet Order:  Regular Diet  Patient's diet has been advanced from clear liquid to regular diet with positive tolerance. Patient reported appetite is getting better. PO intake documented 100% at meals. She stated she has been drinking the resource breeze nutrition supplement and taking the prostat supplement daily, although she dislikes the supplements.   Meds: Scheduled Meds:   . antiseptic oral rinse  15 mL Mouth Rinse BID  . ertapenem  1 g Intravenous Q24H  . feeding supplement  30 mL Oral QID  . feeding supplement  1 Container Oral QID  . furosemide  80 mg Oral BID  . heparin  5,000 Units Subcutaneous Q8H  . metoprolol tartrate  25 mg Oral BID  . pantoprazole  40 mg Oral Q1200  . potassium chloride  10 mEq Intravenous Q1 Hr x 6  . potassium chloride  40 mEq Oral TID AC & HS  . DISCONTD: furosemide  40 mg Oral BID   Continuous Infusions:  PRN Meds:.HYDROcodone-acetaminophen, levalbuterol, loperamide, metoprolol, morphine injection, ondansetron (ZOFRAN) IV, ondansetron, oxyCODONE-acetaminophen, sodium chloride  Labs:  CMP     Component Value Date/Time   NA 140 12/15/2011 0610   K 3.1* 12/15/2011 0610   CL 99 12/15/2011 0610   CO2 32 12/15/2011 0610   GLUCOSE 111* 12/15/2011 0610   BUN 8 12/15/2011 0610   CREATININE 0.70 12/15/2011 0610   CREATININE 0.78 09/23/2010 0925   CALCIUM 9.1 12/15/2011 0610   CALCIUM 10.5 06/10/2009 1342   PROT 5.8* 12/05/2011 0150   ALBUMIN 2.6* 12/05/2011 0150   AST 18 12/05/2011 0150   ALT 11 12/05/2011 0150   ALKPHOS 79 12/05/2011 0150   BILITOT 0.8 12/05/2011 0150   GFRNONAA 86* 12/15/2011 0610   GFRAA >90 12/15/2011 0610     Intake/Output Summary (Last 24 hours) at 12/15/11 1314 Last data filed at 12/15/11 1155  Gross per 24 hour  Intake    720 ml  Output   1354 ml  Net   -634 ml   Wt Readings from Last 10 Encounters:  12/14/11 300 lb (136.079 kg)  12/14/11 300 lb (136.079 kg)  10/06/11 309 lb 11.2 oz (140.479 kg)  09/15/11 308 lb  14.4 oz (140.116 kg)  08/22/11 332 lb 0.2 oz (150.6 kg)  08/22/11 332 lb 0.2 oz (150.6 kg)  08/11/11 321 lb 11.2 oz (145.922 kg)  07/23/11 322 lb 12.8 oz (146.421 kg)  03/12/11 320 lb 8 oz (145.378 kg)  11/25/10 319 lb (144.697 kg)  *Patient's weight has been trending down. Fluctuations in weight likely partially due to changes in fluid status.   Weight Status:  300 lb.  Weight down from weight of 309 lb on 10/06/11.   Re-estimated needs remain the same:   Kcal: 1900-2000  Protein: 100-120 g  Fluid: >1.9L  Nutrition Dx:  Inadequate oral intake (NI-2.1). Status: Ongoing   Goal:  1. PO intake > 75% at meals. 2. Positive tolerance of Regular diet.   Intervention:  1. Now that diet has been advanced to a Regular diet, will discontinue Resource Breeze nutrition supplement and decrease the Prostat supplement from QID to BID.  2. RD to follow for nutrition plan of care.   Monitor:  Weight trends, labs, diet tolerance   Adron Bene Pager #:  [phone]

## 2011-12-15 NOTE — Progress Notes (Signed)
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 Approximately 2 weeks ago, the patient started having persistent right abdominal discomfort and then increased nausea and vomiting and came to the emergency room where was found that she had a small bowel obstruction with transition point within a large ventral hernia. The patient was admitted to the surgical service and the hospitalists were consulted for management of medical issues. Patient underwent a excisional laparotomy with small bowel resection, primary hernia repair and drain placement on 12/04/11.  Dur postop, no worthy events were of a persistent large ileus which eventually resolved and currently the patient is tolerating solid foods. She is markedly deconditioned and will be going to a short-term skilled nursing facility. Because of aggressive volume resuscitation, this led to an acute flare of her chronic diastolic dysfunction and currently she is being diuresed. Her most recent BNP was done on 5/13 noting 2616. Patient had some persistent leukocytosis (as high as 13.5 on 5/11 ) but no source of infection could be discovered and was felt to be secondary to stress margination brought on by her persistent ileus. Number started to decline but has since risen to on 5/14-13.1. Her main medical issue until she is able to go to skilled nursing his volume overload.   Subjective: Patient is suffering much better. Minimal if any diarrhea treated with Imodium. She's feeling slightly less winded.   Objective: Weight change:   Intake/Output Summary (Last 24 hours) at 12/15/11 1613 Last data filed at 12/15/11 1300  Gross per 24 hour  Intake   1200 ml  Output   1154 ml  Net     46 ml     Filed Vitals:   12/15/11 1422  BP: 115/85  Pulse: 111  Temp: 97.4 F (36.3 C)  Resp: 20   General: Alert and oriented x3, fatigue HEENT: Normocephalic, atraumatic, mucous membranes are moist Cardiovascular: Regular rhythm, borderline tachycardia Lungs: Decreased breath sounds throughout secondary to body habitus, otherwise breathing comfortably no wheezing or crackles. Abdomen: Soft, morbidly obese, hypoactive bowel sounds, nontender, wound VAC in place Extremities: No clubbing or cyanosis, trace pitting edema  Lab Results: Basic Metabolic Panel:  Basename 12/15/11 0610 12/14/11 0315  NA 140 142  K 3.1* 2.4*  CL 99 101  CO2 32 35*  GLUCOSE 111* 105*  BUN 8 10  CREATININE 0.70 0.68  CALCIUM 9.1 8.4  MG 1.3* 1.4*  PHOS -- --   CBC:  Basename 12/15/11 0610 12/14/11 0315 12/13/11 0440  WBC 13.1* 11.5* --  NEUTROABS -- -- 6.8  HGB 10.5* 9.6* --  HCT 32.4* 29.3* --  MCV 91.0 90.7 --  PLT 379 275 --    Medications: Scheduled Meds:    . antiseptic oral rinse  15 mL Mouth Rinse BID  . ertapenem  1 g Intravenous Q24H  . feeding supplement  30 mL Oral BID BM  . furosemide  80 mg Oral BID  . heparin  5,000 Units Subcutaneous Q8H  . metoprolol tartrate  25 mg Oral BID  . pantoprazole  40 mg Oral Q1200  . potassium chloride  10 mEq Intravenous Q1 Hr x 6  . potassium chloride  40 mEq Oral TID AC & HS  . DISCONTD: feeding supplement  30 mL Oral QID  . DISCONTD: feeding supplement  1 Container Oral QID  . DISCONTD: furosemide  40 mg Oral BID   Continuous Infusions:   PRN Meds:.HYDROcodone-acetaminophen, levalbuterol, loperamide, metoprolol, morphine injection, ondansetron (ZOFRAN) IV, ondansetron, oxyCODONE-acetaminophen, sodium chloride  Assessment/Plan: Patient Active Hospital Problem List: Acute on chronic diastolic heart failure. Patient has a history of grade 1 diastolic dysfunction and likely with volume resuscitation, she is now acute diastolic heart  failure. Have started diuretics and following intake and output. Unable to get good output readings so we placed a Foley catheter. Patient diuresed an additional several liters yesterday. BNP 2 days ago was at 2200 and will recheck BNP in the morning..  Leukocytosis: Started to rise again, although afebrile. Initially was elevated likely to stress margination from persistent ileus. Recheck labs the morning.  Small bowel obstruction due to strangulated incisional hernia s/p expl lap, small bowel resection, primary hernia repair 12/04/11 (12/04/2011)  followup films noted large ileus. Surgery managing. Tolerating solids.    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    Plan: No signs of elevated calcium levels  HYPERLIPIDEMIA (08/13/2006)   Assessment: Stable.    Plan: Adjust diet as outpatient.  Asthma (12/04/2011)  this is to be stable. I do not think that her current respirations are from asthma. See above.  Respiratory failure following trauma and surgery (12/05/2011)  initial respiratory failure is stable after surgery.  Tachycardia (12/05/2011)   Assessment: Started patient on scheduled beta blocker. Stable. Some of this may be also from pain and minimal hypoxia and deconditioning. Continue to monitor. Most likely from deconditioning.  Diarrhea: Not C. difficile. Have started Imodium. This will improve her now that she's tolerating on solid food.  Disposition: Need short-term skilled nursing.    LOS: 12 days   Dorian Renfro K 12/15/2011, 4:13 PM

## 2011-12-15 NOTE — Progress Notes (Signed)
11 Days Post-Op  Subjective: Tolerating diet and moving bowels.  Breathing a little better.  Objective: Vital signs in last 24 hours: Temp:  [97.6 F (36.4 C)-98.7 F (37.1 C)] 98 F (36.7 C) (05/14 0543) Pulse Rate:  [97-113] 113  (05/14 0543) Resp:  [20] 20  (05/14 0543) BP: (100-133)/(74) 100/74 mmHg (05/14 0543) SpO2:  [94 %-98 %] 96 % (05/14 0543) Last BM Date: 12/14/11  Intake/Output from previous day: 05/13 0701 - 05/14 0700 In: 1700 [P.O.:960; IV Piggyback:500] Out: 1157 [Urine:1151; Stool:6] Intake/Output this shift: Total I/O In: 240 [P.O.:240] Out: 200 [Urine:200]  PE: Abd-soft, VAC on RLQ wound, open area of midline wound is cleaner today  Lab Results:   Basename 12/15/11 0610 12/14/11 0315  WBC 13.1* 11.5*  HGB 10.5* 9.6*  HCT 32.4* 29.3*  PLT 379 275   BMET  Basename 12/15/11 0610 12/14/11 0315  NA 140 142  K 3.1* 2.4*  CL 99 101  CO2 32 35*  GLUCOSE 111* 105*  BUN 8 10  CREATININE 0.70 0.68  CALCIUM 9.1 8.4   PT/INR No results found for this basename: LABPROT:2,INR:2 in the last 72 hours Comprehensive Metabolic Panel:    Component Value Date/Time   NA 140 12/15/2011 0610   K 3.1* 12/15/2011 0610   CL 99 12/15/2011 0610   CO2 32 12/15/2011 0610   BUN 8 12/15/2011 0610   CREATININE 0.70 12/15/2011 0610   CREATININE 0.78 09/23/2010 0925   GLUCOSE 111* 12/15/2011 0610   CALCIUM 9.1 12/15/2011 0610   CALCIUM 10.5 06/10/2009 1342   AST 18 12/05/2011 0150   ALT 11 12/05/2011 0150   ALKPHOS 79 12/05/2011 0150   BILITOT 0.8 12/05/2011 0150   PROT 5.8* 12/05/2011 0150   ALBUMIN 2.6* 12/05/2011 0150     Studies/Results: No results found.  Anti-infectives: Anti-infectives     Start     Dose/Rate Route Frequency Ordered Stop   12/14/11 1830   ertapenem (INVANZ) 1 g in sodium chloride 0.9 % 50 mL IVPB        1 g 100 mL/hr over 30 Minutes Intravenous Every 24 hours 12/14/11 1734     12/05/11 1800   ertapenem (INVANZ) 1 g in sodium chloride 0.9 % 50 mL IVPB   Status:  Discontinued        1 g 100 mL/hr over 30 Minutes Intravenous Every 24 hours 12/04/11 2335 12/14/11 1250   12/04/11 1000   ertapenem (INVANZ) 1 g in sodium chloride 0.9 % 50 mL IVPB  Status:  Discontinued        1 g 100 mL/hr over 30 Minutes Intravenous Every 24 hours 12/04/11 0806 12/04/11 2224          Assessment Principal Problem:  *Small bowel obstruction due to strangulated incisional hernia s/p expl lap, small bowel resection, primary hernia repair 12/04/11 Active Problems:  Acute on chronic diastolic CHF (congestive heart failure)-diuresis in progress  Hypokalemia  Leukocytosis    LOS: 12 days   Plan: Correct hypokalemia.  Check UA and urine culture.  If WBC continues to rise with check CT of abd/pelvis.   Horace Wishon J 12/15/2011

## 2011-12-16 DIAGNOSIS — I5031 Acute diastolic (congestive) heart failure: Secondary | ICD-10-CM

## 2011-12-16 DIAGNOSIS — K56609 Unspecified intestinal obstruction, unspecified as to partial versus complete obstruction: Secondary | ICD-10-CM

## 2011-12-16 DIAGNOSIS — R197 Diarrhea, unspecified: Secondary | ICD-10-CM

## 2011-12-16 DIAGNOSIS — D7289 Other specified disorders of white blood cells: Secondary | ICD-10-CM

## 2011-12-16 LAB — CBC
HCT: 28.7 % — ABNORMAL LOW (ref 36.0–46.0)
Hemoglobin: 9.3 g/dL — ABNORMAL LOW (ref 12.0–15.0)
MCH: 29.3 pg (ref 26.0–34.0)
MCHC: 32.4 g/dL (ref 30.0–36.0)

## 2011-12-16 LAB — BASIC METABOLIC PANEL
BUN: 8 mg/dL (ref 6–23)
Chloride: 101 mEq/L (ref 96–112)
Creatinine, Ser: 0.73 mg/dL (ref 0.50–1.10)
Glucose, Bld: 82 mg/dL (ref 70–99)
Potassium: 3.8 mEq/L (ref 3.5–5.1)

## 2011-12-16 LAB — URINE CULTURE
Colony Count: NO GROWTH
Culture  Setup Time: 201305142211
Special Requests: NORMAL

## 2011-12-16 NOTE — Progress Notes (Signed)
Will Marlyne Beards, PA at bedside for dressing change.

## 2011-12-16 NOTE — Progress Notes (Signed)
Physical Therapy Treatment Patient Details Name: Tina Chase MRN: [id number] DOB: Nov 12, 1940 Today's Date: 12/16/2011 Time: 3086-5784 PT Time Calculation (min): 8 min  PT Assessment / Plan / Recommendation Comments on Treatment Session  2 attempts for treatment session today. On 1st attempt, pt declined session b/c she was about to have breakfast.On 2nd attempt pt agreeable to pivot to chair but declined ambulation-pt about to have lunch and pt also stated she is experiencing diarrhea. Will attempt ambulation on next treatment session.     Follow Up Recommendations  Skilled Nursing Facility vs Home Health with 24 hour supervision   Barriers to Discharge        Equipment Recommendations       Recommendations for Other Services    Frequency Min 3X/week   Plan Discharge plan remains appropriate    Precautions / Restrictions Precautions Precautions: Fall Restrictions Weight Bearing Restrictions: No   Pertinent Vitals/Pain     Mobility  Bed Mobility Bed Mobility: Supine to Sit Supine to Sit: 3: Mod assist Details for Bed Mobility Assistance: Assist for bil LEs and trunk to upright. Pt prefers to pull up on therapist's hand. Increased time.  Transfers Transfers: Sit to Stand;Stand to Sit;Stand Pivot Transfers Sit to Stand: From elevated surface;With upper extremity assist;4: Min guard Stand to Sit: With upper extremity assist;4: Min guard Stand Pivot Transfers: 4: Min guard Details for Transfer Assistance: Pt uses momentum to rock forward and stand. No physical assist needed. Did assist with line/IV pole management Ambulation/Gait Ambulation/Gait Assistance: Not tested (comment) Ambulation/Gait Assistance Details: Pt declined ambulation due to diarrhea.    Exercises     PT Diagnosis:    PT Problem List:   PT Treatment Interventions:     PT Goals Acute Rehab PT Goals PT Goal: Supine/Side to Sit - Progress: Progressing toward goal PT Goal: Sit to Stand - Progress:  Progressing toward goal PT Transfer Goal: Bed to Chair/Chair to Bed - Progress: Progressing toward goal  Visit Information  Last PT Received On: 12/16/11 Assistance Needed: +2 (safety, equipment for ambulation) PT/OT Co-Evaluation/Treatment: Yes    Subjective Data  Subjective: "I don't know about walking with this diarrhea"   Cognition  Overall Cognitive Status: Appears within functional limits for tasks assessed/performed Arousal/Alertness: Awake/alert Orientation Level: Appears intact for tasks assessed Behavior During Session: [employer] for tasks performed    Balance     End of Session PT - End of Session Activity Tolerance: Patient tolerated treatment well Patient left: in chair;with call bell/phone within reach;with family/visitor present    Rebeca Alert [facility] 12/16/2011, 1:44 PM [phone]

## 2011-12-16 NOTE — Plan of Care (Signed)
Problem: Phase III Progression Outcomes Goal: Activity at appropriate level-compared to baseline (UP IN CHAIR FOR HEMODIALYSIS)  Outcome: Completed/Met Date Met:  12/16/11 Pt has general weakness and will be going to skilled nursing facility.

## 2011-12-16 NOTE — Progress Notes (Signed)
Occupational Therapy Treatment Patient Details Name: Tina Chase MRN: [id number] DOB: [birth year] Today's Date: 12/16/2011 Time: 2376-2831 and 5176-1607 OT Time Calculation (min): 17 min  OT Assessment / Plan / Recommendation Comments on Treatment Session      Follow Up Recommendations  Home health OT;Skilled nursing facility    Barriers to Discharge       Equipment Recommendations  3 in 1 bedside comode;Other (comment)    Recommendations for Other Services    Frequency Min 2X/week   Plan Discharge plan remains appropriate    Precautions / Restrictions Precautions Precautions: Fall Restrictions Weight Bearing Restrictions: No   Pertinent Vitals/Pain     ADL  Lower Body Bathing: Simulated;Minimal assistance (with AE) Where Assessed - Lower Body Bathing: Sit to stand from chair Lower Body Dressing: Simulated;Performed;Minimal assistance (with AE; performed one sock only) Where Assessed - Lower Body Dressing: Sit to stand from chair Toilet Transfer: Simulated;Minimal assistance (min guard bed to recliner) Toileting - Hygiene: Simulated (educated on toilet aid; pt did not use today) Equipment Used: Reacher;Long-handled sponge;Sock aid;Other (comment) (toilet aid) Ambulation Related to ADLs: spt only.  PT/OT cotx first part of session:  OT returned after she ate lunch ADL Comments: AE was issued; adl already done:  educated and pt simulated tasks    OT Diagnosis:    OT Problem List:   OT Treatment Interventions:     OT Goals Acute Rehab OT Goals Time For Goal Achievement: 12/23/11 ADL Goals Pt Will Perform Lower Body Bathing: with mod assist;Sit to stand from chair;Sit to stand from bed;with adaptive equipment ADL Goal: Lower Body Bathing - Progress: Updated due to goal met Pt Will Perform Lower Body Dressing: with mod assist;Sit to stand from chair;Sit to stand from bed;with adaptive equipment ADL Goal: Lower Body Dressing - Progress: Updated due to goal met Pt Will  Transfer to Toilet: with mod assist;with DME;3-in-1;Ambulation ADL Goal: Toilet Transfer - Progress: Updated due to goal met Miscellaneous OT Goals Miscellaneous OT Goal #1: revised #1: Pt will complete LB ADLs with AE with supervision, sit to stand OT Goal: Miscellaneous Goal #1 - Progress: Goal set today Miscellaneous OT Goal #2: Revised #2:  Pt will transfer to 3:1 via SPT and complete all aspects of task with supervision, using toilet aid OT Goal: Miscellaneous Goal #2 - Progress: Goal set today  Visit Information  Last OT Received On: 12/16/11 Assistance Needed: +1     Subjective Data      Prior Functioning       Cognition  Overall Cognitive Status: Appears within functional limits for tasks assessed/performed Arousal/Alertness: Awake/alert Orientation Level: Appears intact for tasks assessed Behavior During Session: [facility] for tasks performed    Mobility Bed Mobility Bed Mobility: Supine to Sit Supine to Sit: 3: Mod assist Details for Bed Mobility Assistance: Assist for bil LEs and trunk to upright. Pt prefers to pull up on therapist's hand.  Transfers Sit to Stand: From elevated surface;With upper extremity assist;4: Min guard Stand to Sit: With upper extremity assist;4: Min guard Details for Transfer Assistance: Pt uses momentum to rock forward and stand. No physical assist needed. Did assist with line/IV pole management   Exercises    Balance    End of Session OT - End of Session Activity Tolerance: Patient tolerated treatment well Patient left: in chair;with call bell/phone within reach;with family/visitor present   Asia Dusenbury 12/16/2011, 3:16 PM Marica Otter, OTR/L [phone] 12/16/2011

## 2011-12-16 NOTE — Progress Notes (Signed)
12 Days Post-Op  Subjective: Complains of diarrhea again this AM, burns to void, feels bad this AM.  Drain makes it difficult to get OOB to bedside commode.  Objective: Vital signs in last 24 hours: Temp:  [97.4 F (36.3 C)-98.2 F (36.8 C)] 98.2 F (36.8 C) (05/15 0510) Pulse Rate:  [110-115] 110  (05/15 0510) Resp:  [19-20] 19  (05/15 0510) BP: (106-134)/(67-85) 106/67 mmHg (05/15 0510) SpO2:  [91 %-94 %] 92 % (05/15 0510) Weight:  [135.5 kg (298 lb 11.6 oz)] 135.5 kg (298 lb 11.6 oz) (05/15 0510) Last BM Date: 12/15/11 Afebrile, VSS, Stay tachycardic 98-115, WBC is up, also staying in the 11.3-13.5 range since 5/11 UA shows 0-2 of WBC culture is pending, hbg is positive on UA.  She is still on Invanz Day 12. C Diff was negative 5/10  Intake/Output from previous day: 05/14 0701 - 05/15 0700 In: 820 [P.O.:720; IV Piggyback:100] Out: 700 [Urine:700] Intake/Output this shift: Total I/O In: -  Out: 200 [Urine:200]  General appearance: alert, cooperative, no distress and uncomfortable Resp: clear to auscultation bilaterally GI: Still very tender, worried about pulling something loose with moving., +BS, diarrhea again this AM.  I will look at wound when dressing is changed..  Lab Results:   Basename 12/16/11 0416 12/15/11 0610  WBC 12.6* 13.1*  HGB 9.3* 10.5*  HCT 28.7* 32.4*  PLT 357 379    BMET  Basename 12/16/11 0416 12/15/11 0610  NA 140 140  K 3.8 3.1*  CL 101 99  CO2 31 32  GLUCOSE 82 111*  BUN 8 8  CREATININE 0.73 0.70  CALCIUM 9.1 9.1   PT/INR No results found for this basename: LABPROT:2,INR:2 in the last 72 hours  No results found for this basename: AST:5,ALT:5,ALKPHOS:5,BILITOT:5,PROT:5,ALBUMIN:5 in the last 168 hours   Lipase  No results found for this basename: lipase     Studies/Results: No results found.  Medications:    . antiseptic oral rinse  15 mL Mouth Rinse BID  . ertapenem  1 g Intravenous Q24H  . feeding supplement  30 mL Oral  BID BM  . furosemide  80 mg Oral BID  . heparin  5,000 Units Subcutaneous Q8H  . metoprolol tartrate  25 mg Oral BID  . pantoprazole  40 mg Oral Q1200  . potassium chloride  10 mEq Intravenous Q1 Hr x 6  . potassium chloride  40 mEq Oral TID AC & HS  . DISCONTD: feeding supplement  30 mL Oral QID  . 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  Plan:  K+ replaced, will talk with Dr. Abbey Chatters about high WBC.      LOS: 13 days    Tina Chase 12/16/2011

## 2011-12-16 NOTE — Progress Notes (Signed)
CSW provided bed offers to patient, she stated she wants to look over list with family. CSW will check back this afternoon re: SNF decision.   Unice Bailey, LCSWA [phone]

## 2011-12-16 NOTE — Progress Notes (Signed)
Pt had Run of SVT. HR 187. Pt assessed. Pt does not have any complaints at this time. Will continue to monitor.

## 2011-12-16 NOTE — Progress Notes (Signed)
Patient seen and examined.  Agree with PA's note. Will stop IV abxs tomorrow unless WBC rises significantly.

## 2011-12-16 NOTE — Progress Notes (Signed)
Patient ID: Tina Chase, female   DOB: [birth year], 71 y.o.   MRN: [id number]  Subjective: No events overnight. Patient denies chest pain, shortness of breath. Pt reports persistent diarrhea.   Objective:  Vital signs in last 24 hours:  Filed Vitals:   12/15/11 1422 12/15/11 2135 12/16/11 0510 12/16/11 1045  BP: 115/85 134/84 106/67 102/68  Pulse: 111 115 110 115  Temp: 97.4 F (36.3 C) 98 F (36.7 C) 98.2 F (36.8 C)   SpO2: 94% 91% 92% 95%   Intake/Output from previous day:  Intake/Output Summary (Last 24 hours) at 12/16/11 1312 Last data filed at 12/16/11 1100  Gross per 24 hour  Intake    340 ml  Output    900 ml  Net   -560 ml   Physical Exam: General: Alert, awake, oriented x3, in no acute distress. HEENT: No bruits, no goiter. Moist mucous membranes, no scleral icterus, no conjunctival pallor. Heart: Regular rate and rhythm, S1/S2 +, no murmurs, rubs, gallops. Lungs: Clear to auscultation bilaterally. No wheezing, no rhonchi, no rales.  Abdomen: Soft, mildly tender in the epigastric area, nondistended, positive bowel sounds. Extremities: No clubbing or cyanosis, trace bilateral lower extremity pitting edema,  positive pedal pulses. Neuro: Grossly nonfocal.  Lab Results:  Lab 12/16/11 0416 12/15/11 0610 12/14/11 0315 12/13/11 0440 12/12/11 0410  WBC 12.6* 13.1* 11.5* 11.3* 13.5*  HGB 9.3* 10.5* 9.6* 9.7* 9.9*  HCT 28.7* 32.4* 29.3* 30.3* 31.9*  PLT 357 379 275 295 249    Lab 12/16/11 0416 12/15/11 0610 12/14/11 0315 12/11/11 0530 12/10/11 1305  NA 140 140 142 143 142  K 3.8 3.1* 2.4* 3.5 3.9  CL 101 99 101 109 110  CO2 31 32 35* 30 27  GLUCOSE 82 111* 105* 112* 125*  BUN 8 8 10 9 7   CREATININE 0.73 0.70 0.68 0.71 0.64  CALCIUM 9.1 9.1 8.4 9.4 9.9  MG -- 1.3* 1.4* -- --   C. Diff by PCR 12/11/2011: negative Blood Cultures 12/12/2011: negative to date  Studies/Results: No results found.  Medications: Scheduled Meds:   . antiseptic oral rinse  15 mL  Mouth Rinse BID  . ertapenem  1 g Intravenous Q24H  . feeding supplement  30 mL Oral BID BM  . furosemide  80 mg Oral BID  . heparin  5,000 Units Subcutaneous Q8H  . metoprolol tartrate  25 mg Oral BID  . pantoprazole  40 mg Oral Q1200  . potassium chloride  10 mEq Intravenous Q1 Hr x 6  . potassium chloride  40 mEq Oral TID AC & HS   Continuous Infusions:  PRN Meds:.HYDROcodone-acetaminophen, levalbuterol, loperamide, metoprolol, morphine injection, ondansetron (ZOFRAN) IV, ondansetron, oxyCODONE-acetaminophen, sodium chloride  Assessment/Plan:  Principal Problem:  *Small bowel obstruction due to strangulated incisional hernia s/p expl lap, small bowel resection, primary hernia repair 12/04/11 - surgery managing and will continue to follow up on recommendations  Active Problems:  HYPERLIPIDEMIA - this remains stable   HYPERPARATHYROIDISM NOS - stable   Asthma - remains clinically stable - pt maintaining oxygen saturation > 93% on RA   Respiratory failure following trauma and surgery - clinically stable at this point - will continue to monitor vitals per floor protocol   Tachycardia - will increase Metoprolol to 50 mg BID - continue to monitor vitals per floor protocol   Acute on chronic diastolic CHF (congestive heart failure) - Patient has a history of grade 1 diastolic dysfunction and likely with volume resuscitation - We have  started diuretics and following intake and output.  - Pt continues to diurese well and creatinine is stable and within normal limits - BNP continues to trend down  Diarrhea - Not C. difficile. Have started Imodium.   Disposition - Need short-term skilled nursing.   EDUCATION - test results and diagnostic studies were discussed with patient  - patient verbalized the understanding - questions were answered at the bedside and contact information was provided for additional questions or concerns   LOS: 13 days   MAGICK-Tammie Ellsworth,  Vivian Neuwirth 12/16/2011, 1:12 PM  [facility] Pager: [phone]

## 2011-12-16 NOTE — Progress Notes (Signed)
Physical Therapy Note  Attempted PT tx session this am. Pt requested PT check back later today. Will check back as schedule permits. Thanks. Rebeca Alert, PT [phone]

## 2011-12-17 DIAGNOSIS — I5031 Acute diastolic (congestive) heart failure: Secondary | ICD-10-CM

## 2011-12-17 DIAGNOSIS — D7289 Other specified disorders of white blood cells: Secondary | ICD-10-CM

## 2011-12-17 DIAGNOSIS — K56609 Unspecified intestinal obstruction, unspecified as to partial versus complete obstruction: Secondary | ICD-10-CM

## 2011-12-17 DIAGNOSIS — I5021 Acute systolic (congestive) heart failure: Secondary | ICD-10-CM

## 2011-12-17 LAB — CBC
HCT: 29.6 % — ABNORMAL LOW (ref 36.0–46.0)
MCH: 28.9 pg (ref 26.0–34.0)
MCHC: 31.8 g/dL (ref 30.0–36.0)
MCV: 91.1 fL (ref 78.0–100.0)
RDW: 14.9 % (ref 11.5–15.5)

## 2011-12-17 LAB — BASIC METABOLIC PANEL
BUN: 11 mg/dL (ref 6–23)
Calcium: 9.4 mg/dL (ref 8.4–10.5)
Creatinine, Ser: 0.86 mg/dL (ref 0.50–1.10)
GFR calc Af Amer: 78 mL/min — ABNORMAL LOW (ref >90)
GFR calc non Af Amer: 67 mL/min — ABNORMAL LOW (ref >90)

## 2011-12-17 MED ORDER — METOPROLOL TARTRATE 50 MG PO TABS
50.0000 mg | ORAL_TABLET | Freq: Two times a day (BID) | ORAL | Status: DC
  Administered 2011-12-17 – 2011-12-23 (×12): 50 mg via ORAL
  Filled 2011-12-17 (×13): qty 1

## 2011-12-17 NOTE — Progress Notes (Signed)
13 Days Post-Op  Subjective: Not feeling real good just had another loose stool, really just water.    Objective: Vital signs in last 24 hours: Temp:  [97.3 F (36.3 C)-98.1 F (36.7 C)] 98.1 F (36.7 C) (05/16 0500) Pulse Rate:  [75-112] 112  (05/16 0500) Resp:  [18-20] 18  (05/16 0500) BP: (103-120)/(68-75) 120/75 mmHg (05/16 0500) SpO2:  [94 %-97 %] 96 % (05/16 0500) Weight:  [141.1 kg (311 lb 1.1 oz)] 141.1 kg (311 lb 1.1 oz) (05/16 0500) Last BM Date: 12/16/11  4 stools yesterday, 2 today, afebrile, tachycardic, medicine increasing BB.WBC still up. Day 13 of Invanz  Intake/Output from previous day: 05/15 0701 - 05/16 0700 In: 530 [P.O.:480; IV Piggyback:50] Out: 1700 [Urine:1700] Intake/Output this shift: Total I/O In: -  Out: 301 [Urine:300; Stool:1]  General appearance: alert, cooperative and no distress Resp: clear to auscultation bilaterally GI: soft, tolerating regular diet, multiple loose stools and I just saw the last and it's all water. Incision/Wound:  The open wound still has allot of drainage, and ongoing soupy, white necrotic tissue at the base of the wound.    Lab Results:   Basename 12/17/11 0543 12/16/11 0416  WBC 12.5* 12.6*  HGB 9.4* 9.3*  HCT 29.6* 28.7*  PLT 442* 357    BMET  Basename 12/17/11 0543 12/16/11 0416  NA 139 140  K 3.7 3.8  CL 99 101  CO2 32 31  GLUCOSE 87 82  BUN 11 8  CREATININE 0.86 0.73  CALCIUM 9.4 9.1   PT/INR No results found for this basename: LABPROT:2,INR:2 in the last 72 hours  No results found for this basename: AST:5,ALT:5,ALKPHOS:5,BILITOT:5,PROT:5,ALBUMIN:5 in the last 168 hours   Lipase  No results found for this basename: lipase     Studies/Results: No results found.  Medications:    . antiseptic oral rinse  15 mL Mouth Rinse BID  . ertapenem  1 g Intravenous Q24H  . feeding supplement  30 mL Oral BID BM  . furosemide  80 mg Oral BID  . heparin  5,000 Units Subcutaneous Q8H  . metoprolol  tartrate  50 mg Oral BID  . pantoprazole  40 mg Oral Q1200  . potassium chloride  40 mEq Oral TID AC & HS  . DISCONTD: metoprolol tartrate  25 mg Oral BID    Assessment/Plan Small bowel obstruction due to strangulated incisional hernia s/p expl lap, small bowel resection(90 cm), primary hernia repair 12/04/11 (12/04/2011)  S/P NEPHRECTOMY Jan 2013 Respiratory failure following trauma and surgery /(12/05/2011) Tachycardia (12/05/2011) Asthma (12/04/2011) HYPERPARATHYROIDISM  HYPERLIPIDEMIA  BMI 55  Post op DCHF/ EF 50-55% on 2D Echo 3/22/123 Diarrhea/leukocytosis/ day 13  Invanz/ C Diff neg 12/11/11.  Plan: Multiple request from Case management, and social worker on disposition.  Plan, Stop Invanz, repeat c. Diff today, it's been 6 days since the last with ongoing diarrhea.  Recheck labs tomorrow.  If she continues to run an elevated WBC, consider repeating CT tomorrow.  She is moving much better from chair to bed, breathing markedly improved.        LOS: 14 days    Tina Chase 12/17/2011

## 2011-12-17 NOTE — Progress Notes (Signed)
Patient ID: Tina Chase, female   DOB: 01/10/1941, 71 y.o.   MRN: [id number]  Subjective: No events overnight. Patient denies chest pain, shortness of breath, abdominal pain. Reports ambulating.  Objective:  Vital signs in last 24 hours:  Filed Vitals:   12/16/11 1045 12/16/11 1430 12/16/11 2114 12/17/11 0500  BP: 102/68 115/74 103/68 120/75  Pulse: 115 106 75 112  Temp:  97.3 F (36.3 C) 98 F (36.7 C) 98.1 F (36.7 C)  SpO2: 95% 94% 97% 96%   Intake/Output from previous day:  Intake/Output Summary (Last 24 hours) at 12/17/11 1047 Last data filed at 12/17/11 1022  Gross per 24 hour  Intake    290 ml  Output   1801 ml  Net  -1511 ml    Physical Exam: General: Alert, awake, oriented x3, in no acute distress. HEENT: No bruits, no goiter. Moist mucous membranes, no scleral icterus, no conjunctival pallor. Heart: Regular rate and rhythm, S1/S2 +, no murmurs, rubs, gallops. Lungs: Clear to auscultation bilaterally. No wheezing, no rhonchi, no rales.  Abdomen: Soft, nontender, nondistended, positive bowel sounds. Extremities: No clubbing or cyanosis, trace bilateral pitting edema,  positive pedal pulses. Neuro: Grossly nonfocal.  Lab Results:  Lab 12/17/11 0543 12/16/11 0416 12/15/11 0610 12/14/11 0315 12/13/11 0440  WBC 12.5* 12.6* 13.1* 11.5* 11.3*  HGB 9.4* 9.3* 10.5* 9.6* 9.7*  HCT 29.6* 28.7* 32.4* 29.3* 30.3*  PLT 442* 357 379 275 295   Lab 12/17/11 0543 12/16/11 0416 12/15/11 0610 12/14/11 0315 12/11/11 0530  NA 139 140 140 142 143  K 3.7 3.8 3.1* 2.4* 3.5  CL 99 101 99 101 109  CO2 32 31 32 35* 30  GLUCOSE 87 82 111* 105* 112*  BUN 11 8 8 10 9   CREATININE 0.86 0.73 0.70 0.68 0.71  CALCIUM 9.4 9.1 9.1 8.4 9.4   C. Diff by PCR 12/11/2011: negative  Blood Cultures 12/12/2011: negative to date  Studies/Results: No results found.  Medications: Scheduled Meds:   . antiseptic oral rinse  15 mL Mouth Rinse BID  . ertapenem  1 g Intravenous Q24H  . feeding  supplement  30 mL Oral BID BM  . furosemide  80 mg Oral BID  . heparin  5,000 Units Subcutaneous Q8H  . metoprolol tartrate  25 mg Oral BID  . pantoprazole  40 mg Oral Q1200  . potassium chloride  40 mEq Oral TID AC & HS   Continuous Infusions:  PRN Meds:.HYDROcodone-acetaminophen, levalbuterol, loperamide, metoprolol, morphine injection, ondansetron (ZOFRAN) IV, ondansetron, oxyCODONE-acetaminophen, sodium chloride  Assessment/Plan:  Principal Problem:  *Small bowel obstruction due to strangulated incisional hernia s/p expl lap, small bowel resection, primary hernia repair 12/04/11  - surgery managing and will continue to follow up on recommendations   Active Problems:  HYPERLIPIDEMIA  - this remains stable   HYPERPARATHYROIDISM NOS  - stable   Asthma  - remains clinically stable  - pt maintaining oxygen saturation > 93% on RA   Respiratory failure following trauma and surgery  - clinically stable at this point  - will continue to monitor vitals per floor protocol   Tachycardia  - will increase Metoprolol to 50 mg BID  - continue to monitor vitals per floor protocol   Acute on chronic diastolic CHF (congestive heart failure)  - Patient has a history of grade 1 diastolic dysfunction and likely with volume resuscitation  - We have started diuretics and following intake and output.  - Pt continues to diurese well and creatinine  is stable and within normal limits  - BNP continues to trend down   Diarrhea  - Not C. difficile. Have started Imodium.  - this is now improving  Disposition  - Needs short-term skilled nursing.   EDUCATION  - test results and diagnostic studies were discussed with patient  - patient verbalized the understanding  - questions were answered at the bedside and contact information was provided for additional questions or concerns    LOS: 14 days   MAGICK-Rodgerick Gilliand 12/17/2011, 10:47 AM  [facility] Pager: [phone]

## 2011-12-18 ENCOUNTER — Inpatient Hospital Stay (HOSPITAL_COMMUNITY): Payer: PRIVATE HEALTH INSURANCE

## 2011-12-18 DIAGNOSIS — I5021 Acute systolic (congestive) heart failure: Secondary | ICD-10-CM

## 2011-12-18 DIAGNOSIS — I5031 Acute diastolic (congestive) heart failure: Secondary | ICD-10-CM

## 2011-12-18 DIAGNOSIS — K56609 Unspecified intestinal obstruction, unspecified as to partial versus complete obstruction: Secondary | ICD-10-CM

## 2011-12-18 DIAGNOSIS — D7289 Other specified disorders of white blood cells: Secondary | ICD-10-CM

## 2011-12-18 LAB — CBC
Hemoglobin: 9.5 g/dL — ABNORMAL LOW (ref 12.0–15.0)
MCH: 29.5 pg (ref 26.0–34.0)
MCHC: 32.3 g/dL (ref 30.0–36.0)
Platelets: 438 10*3/uL — ABNORMAL HIGH (ref 150–400)

## 2011-12-18 LAB — BASIC METABOLIC PANEL
Calcium: 9.4 mg/dL (ref 8.4–10.5)
GFR calc non Af Amer: 68 mL/min — ABNORMAL LOW (ref >90)
Glucose, Bld: 101 mg/dL — ABNORMAL HIGH (ref 70–99)
Sodium: 140 mEq/L (ref 135–145)

## 2011-12-18 LAB — CULTURE, BLOOD (ROUTINE X 2)
Culture  Setup Time: 201305112056
Culture  Setup Time: 201305112056
Culture: NO GROWTH

## 2011-12-18 MED ORDER — IOHEXOL 350 MG/ML SOLN
100.0000 mL | Freq: Once | INTRAVENOUS | Status: AC | PRN
  Administered 2011-12-18: 70 mL via INTRAVENOUS

## 2011-12-18 MED ORDER — HEPARIN (PORCINE) IN NACL 100-0.45 UNIT/ML-% IJ SOLN
1600.0000 [IU]/h | INTRAMUSCULAR | Status: DC
  Administered 2011-12-19: 1600 [IU]/h via INTRAVENOUS
  Filled 2011-12-18: qty 250

## 2011-12-18 MED ORDER — ENOXAPARIN SODIUM 150 MG/ML ~~LOC~~ SOLN
1.0000 mg/kg | Freq: Two times a day (BID) | SUBCUTANEOUS | Status: DC
  Administered 2011-12-18: 140 mg via SUBCUTANEOUS
  Filled 2011-12-18 (×2): qty 1

## 2011-12-18 MED ORDER — IOHEXOL 300 MG/ML  SOLN
100.0000 mL | Freq: Once | INTRAMUSCULAR | Status: AC | PRN
  Administered 2011-12-18: 100 mL via INTRAVENOUS

## 2011-12-18 NOTE — Progress Notes (Signed)
Call received from radiology.  Pt's abdominal CT concerning for possible PE.  Orders received from [place], [place] for CT angiogram.  They will perform the test while pt is still in radiology.  Will assess pt upon her return to her room.  Ardyth Gal, RN 12/18/2011

## 2011-12-18 NOTE — Progress Notes (Addendum)
ANTICOAGULATION CONSULT NOTE - Initial Consult  Pharmacy Consult for Heparin (note that Lovenox was changed to Heparin after initial Lovenox note done and first dose given) Indication: pulmonary embolus  Allergies  Allergen Reactions  . Hydralazine Anaphylaxis    Swelling tongue.  . Aspirin     REACTION: hives  . Crab (Shellfish Allergy) Hives  . Lisinopril     REACTION: angioedema    Patient Measurements: Height: 5\' 4"  (162.6 cm) Weight: 311 lb 1.1 oz (141.1 kg) IBW/kg (Calculated) : 54.7   Vital Signs: Temp: 97.7 F (36.5 C) (05/17 1318) Temp src: Oral (05/17 1318) BP: 106/75 mmHg (05/17 1318) Pulse Rate: 99  (05/17 1318)  Labs:  Basename 12/18/11 0430 12/17/11 0543 12/16/11 0416  HGB 9.5* 9.4* --  HCT 29.4* 29.6* 28.7*  PLT 438* 442* 357  APTT -- -- --  LABPROT -- -- --  INR -- -- --  HEPARINUNFRC -- -- --  CREATININE 0.85 0.86 0.73  CKTOTAL -- -- --  CKMB -- -- --  TROPONINI -- -- --    Estimated Creatinine Clearance: 88.1 ml/min (by C-G formula based on Cr of 0.85).   Medical History: Past Medical History  Diagnosis Date  . Hyperlipidemia   . Hypertension   . Hyperparathyroidism   . Obesity   . Hyperglycemia   . Hypercalcemia   . Asthma   . Shortness of breath     wtih exertion   . Chronic kidney disease     right non functioning kidney   . Arthritis     left knee  and left shoulder     Medications:  Scheduled:    . antiseptic oral rinse  15 mL Mouth Rinse BID  . feeding supplement  30 mL Oral BID BM  . furosemide  80 mg Oral BID  . metoprolol tartrate  50 mg Oral BID  . pantoprazole  40 mg Oral Q1200  . potassium chloride  40 mEq Oral TID AC & HS  . DISCONTD: heparin  5,000 Units Subcutaneous Q8H   Infusions:    Assessment: 71 yo female s/p small bowel resection and primary hernia repair on 5/3 now found to have PE per CT scan to start Lovenox dosing per pharmacy. Note that patient was on 5K SQ heparin q8 dosing with last dose being  given this AM at 0700  Goal of Therapy:  Heparin level 0.6-1.2 units/ml Monitor platelets by anticoagulation protocol: Yes   Plan:  1. Start Lovenox 1mg /kg (140mg ) SQ q12 for CrCl > 30 ml/min 2. CBC daily   Hessie Knows, PharmD, BCPS Pager [phone] 12/18/2011 5:01 PM   ------------------------------------------------------------------------------------------- Addendum:  A/P: Note that Lovenox 140mg  SQ q12 was discontinued and patient is to be started on IV heparin per [facility] for potential surgical procedure in future. Also note that 140mg  of Lovenox was already administered at 1726 this PM, so a IV heparin will not be started until 3/4 of the 12 hr dosing interval for Lovenox has lapsed (i.e. 9 hrs after Lovenox dose given). No bolus dose of IV heparin will be given either as a result of Lovenox already being administered.   Start IV heparin 1600 units/hr tomorrow 5/18 at 2am  Check heparin level 6hrs after start of heparin  Daily CBC and heparin level   Hessie Knows, PharmD, BCPS Pager [phone] 12/18/2011 6:24 PM

## 2011-12-18 NOTE — Progress Notes (Signed)
OT Note:  Pt declines OT this pm.  She worked with PT earlier, has done ADL, and is awaiting CT scan.  Will check back next week.  [place], OTR/L [phone] 12/18/2011

## 2011-12-18 NOTE — Progress Notes (Signed)
Physical Therapy Treatment Patient Details Name: Tina Chase MRN: [id number] DOB: 1940/09/16 Today's Date: 12/18/2011 Time: 4696-2952 PT Time Calculation (min): 11 min  PT Assessment / Plan / Recommendation Comments on Treatment Session  Pt required a little motivation to ambulate today however agreeable.  Pt already up in chair upon entering room so did not assess bed mobility however last note reports modA.  Recommend HHPT only if 24/7 assist available at home.    Follow Up Recommendations  Home health PT;Supervision/Assistance - 24 hour (if not then SNF)    Barriers to Discharge        Equipment Recommendations  3 in 1 bedside comode    Recommendations for Other Services    Frequency     Plan Discharge plan remains appropriate;Frequency remains appropriate    Precautions / Restrictions Precautions Precaution Comments: wound vac   Pertinent Vitals/Pain     Mobility  Transfers Transfers: Sit to Stand;Stand to Sit Sit to Stand: 5: Supervision;With armrests;From chair/3-in-1 Stand to Sit: 5: Supervision;With armrests;To chair/3-in-1 Ambulation/Gait Ambulation/Gait Assistance: 5: Supervision Ambulation Distance (Feet): 160 Feet Assistive device: Rolling walker Ambulation/Gait Assistance Details: pt reports limited distance 2* fear of need to use restroom Gait Pattern: Step-through pattern Gait velocity: decreased    Exercises     PT Diagnosis:    PT Problem List:   PT Treatment Interventions:     PT Goals Acute Rehab PT Goals Pt will go Sit to Stand: with modified independence PT Goal: Sit to Stand - Progress: Updated due to goal met Pt will go Stand to Sit: with modified independence PT Goal: Stand to Sit - Progress: Updated due to goals met PT Goal: Ambulate - Progress: Partly met  Visit Information  Last PT Received On: 12/18/11 Assistance Needed: +1    Subjective Data  Subjective: "it's hard to walk when i might have an accident. that's embarrassing."   (pt still reports loose BM)   Cognition  Overall Cognitive Status: Appears within functional limits for tasks assessed/performed    Balance     End of Session PT - End of Session Activity Tolerance: Patient tolerated treatment well Patient left: in chair;with call bell/phone within reach    [facility] E 12/18/2011, 10:32 AM Pager: [phone]

## 2011-12-18 NOTE — Progress Notes (Signed)
Patient seen and examined.  Agree with PA's note.  If midline wound cleans up a little more, can put VAC on.

## 2011-12-18 NOTE — Progress Notes (Signed)
14 Days Post-Op  Subjective: Eating is OK, complains of diarrhea, but only two recorded.    Objective: Vital signs in last 24 hours: Temp:  [97.9 F (36.6 C)-98.3 F (36.8 C)] 98 F (36.7 C) (05/17 0620) Pulse Rate:  [106] 106  (05/17 0620) Resp:  [18-19] 18  (05/17 0620) BP: (99-113)/(64-71) 99/64 mmHg (05/17 0620) SpO2:  [90 %-92 %] 90 % (05/17 0620) Last BM Date: 12/17/11  2 stools recorded yestereday, 1200 PO, afebrile Tachycardic, SBP 120-99 repeat C diff is negative, CT 12/03/11  Intake/Output from previous day: 05/16 0701 - 05/17 0700 In: 1200 [P.O.:1200] Out: 902 [Urine:900; Stool:2] Intake/Output this shift: Total I/O In: 240 [P.O.:240] Out: 1100 [Urine:1100]  General appearance: alert, cooperative and no distress Resp: clear to auscultation bilaterally GI: +BS, no distension, no abnormal tenderness.  The open abdominal wound still has necrotic tissue at the base and a fair amount of dranage on dressing.  It has not been changed since I did it yesterday.  The 2nd incision with the wound vac looks great.  Lab Results:   Basename 12/18/11 0430 12/17/11 0543  WBC 12.8* 12.5*  HGB 9.5* 9.4*  HCT 29.4* 29.6*  PLT 438* 442*    BMET  Basename 12/18/11 0430 12/17/11 0543  NA 140 139  K 3.6 3.7  CL 99 99  CO2 31 32  GLUCOSE 101* 87  BUN 12 11  CREATININE 0.85 0.86  CALCIUM 9.4 9.4   PT/INR No results found for this basename: LABPROT:2,INR:2 in the last 72 hours  No results found for this basename: AST:5,ALT:5,ALKPHOS:5,BILITOT:5,PROT:5,ALBUMIN:5 in the last 168 hours   Lipase  No results found for this basename: lipase     Studies/Results: No results found.  Medications:    . antiseptic oral rinse  15 mL Mouth Rinse BID  . feeding supplement  30 mL Oral BID BM  . furosemide  80 mg Oral BID  . heparin  5,000 Units Subcutaneous Q8H  . metoprolol tartrate  50 mg Oral BID  . pantoprazole  40 mg Oral Q1200  . potassium chloride  40 mEq Oral TID AC &  HS  . 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  Plan:  I am going to repeat CT, and see if there is any intraabdominal source for WBC.  Then we can make plans for disposition.       LOS: 15 days    Tina Chase 12/18/2011

## 2011-12-18 NOTE — Progress Notes (Signed)
CT scan of the abdomen and pelvis demonstrate findings suspicious for Pulmonary embolism. CT of the chest confirms this. IV heparin is being started. CT scan of the abdomen and pelvis also demonstrates a small fluid collection that may or may not be an abscess. I have discussed this with her. Plan would be just to monitor her white blood cell count and if it started rising to start her on antibiotics. Given the need to treat her acute pulmonary embolism, a percutaneous drainage procedure cannot be done at this time.

## 2011-12-18 NOTE — Progress Notes (Signed)
Received verbal report from Dr. Ty Hilts in radiology.  CTA chest showed pulmonary embolus spanning left and right pulmonary artery.  Paged Will Marlyne Beards and Dr. Izola Price to notify.  Will continue to monitor pt and await official report in computer.  Ardyth Gal, RN 12/18/2011

## 2011-12-18 NOTE — Progress Notes (Signed)
Patient ID: Tina Chase, female   DOB: April 17, 1941, 71 y.o.   MRN: [id number]  Subjective: No events overnight. Patient denies chest pain, shortness of breath, abdominal pain.  Objective:  Vital signs in last 24 hours:  Filed Vitals:   12/17/11 1335 12/17/11 2115 12/18/11 0620 12/18/11 1318  BP: 103/69 113/71 99/64 106/75  Pulse: 106 106 106 99  Temp: 97.9 F (36.6 C) 98.3 F (36.8 C) 98 F (36.7 C) 97.7 F (36.5 C)  TempSrc: Oral Oral Oral Oral  Resp: 18 19 18 18   Height:      Weight:      SpO2: 92% 92% 90% 94%    Intake/Output from previous day:   Intake/Output Summary (Last 24 hours) at 12/18/11 1611 Last data filed at 12/18/11 0900  Gross per 24 hour  Intake    840 ml  Output   1500 ml  Net   -660 ml    Physical Exam: General: Alert, awake, oriented x3, in no acute distress. HEENT: No bruits, no goiter. Moist mucous membranes, no scleral icterus, no conjunctival pallor. Heart: Regular rhythm but tachycardic, S1/S2 +, no murmurs, rubs, gallops. Lungs: Clear to auscultation bilaterally. No wheezing, no rhonchi, no rales.  Abdomen: Soft, nontender, nondistended, positive bowel sounds. Extremities: No clubbing or cyanosis, no pitting edema,  positive pedal pulses. Neuro: Grossly nonfocal.  Lab Results:  Lab 12/18/11 0430 12/17/11 0543 12/16/11 0416 12/15/11 0610 12/14/11 0315 12/13/11 0440  WBC 12.8* 12.5* 12.6* 13.1* 11.5* --  HGB 9.5* 9.4* 9.3* 10.5* 9.6* --  HCT 29.4* 29.6* 28.7* 32.4* 29.3* --  PLT 438* 442* 357 379 275 --    Lab 12/18/11 0430 12/17/11 0543 12/16/11 0416 12/15/11 0610 12/14/11 0315  NA 140 139 140 140 142  K 3.6 3.7 3.8 3.1* 2.4*  CL 99 99 101 99 101  CO2 31 32 31 32 35*  GLUCOSE 101* 87 82 111* 105*  BUN 12 11 8 8 10   CREATININE 0.85 0.86 0.73 0.70 0.68  CALCIUM 9.4 9.4 9.1 9.1 8.4  MG -- -- -- 1.3* 1.4*    Studies/Results: No results found.  Medications: Scheduled Meds:   . antiseptic oral rinse  15 mL Mouth Rinse BID  .  feeding supplement  30 mL Oral BID BM  . furosemide  80 mg Oral BID  . heparin  5,000 Units Subcutaneous Q8H  . metoprolol tartrate  50 mg Oral BID  . pantoprazole  40 mg Oral Q1200  . potassium chloride  40 mEq Oral TID AC & HS   Continuous Infusions:  PRN Meds:.HYDROcodone-acetaminophen, levalbuterol, loperamide, metoprolol, morphine injection, ondansetron (ZOFRAN) IV, ondansetron, oxyCODONE-acetaminophen, sodium chloride  Assessment/Plan:  Principal Problem:  *Small bowel obstruction due to strangulated incisional hernia s/p expl lap, small bowel resection, primary hernia repair 12/04/11  - surgery managing and will continue to follow up on recommendations   Active Problems:  HYPERLIPIDEMIA  - this remains stable   HYPERPARATHYROIDISM NOS  - stable   Asthma  - remains clinically stable  - pt maintaining oxygen saturation > 93% on RA   Respiratory failure following trauma and surgery  - clinically stable at this point  - will continue to monitor vitals per floor protocol   Tachycardia  - continue Metoprolol to 50 mg BID  - continue to monitor vitals per floor protocol   Acute on chronic diastolic CHF (congestive heart failure)  - Patient has a history of grade 1 diastolic dysfunction and likely with volume resuscitation  -  We have started diuretics and following intake and output.  - Pt continues to diurese well and creatinine is stable and within normal limits  - BNP continues to trend down   Diarrhea  - Not C. difficile. Have started Imodium.  - this is now improving   Disposition  - Needs short-term skilled nursing.   EDUCATION  - test results and diagnostic studies were discussed with patient  - patient verbalized the understanding  - questions were answered at the bedside and contact information was provided for additional questions or concerns      LOS: 15 days   MAGICK-Kostas Marrow 12/18/2011, 4:11 PM  [facility] Pager: [phone]

## 2011-12-19 DIAGNOSIS — I2699 Other pulmonary embolism without acute cor pulmonale: Secondary | ICD-10-CM | POA: Diagnosis not present

## 2011-12-19 DIAGNOSIS — I5031 Acute diastolic (congestive) heart failure: Secondary | ICD-10-CM

## 2011-12-19 DIAGNOSIS — I5021 Acute systolic (congestive) heart failure: Secondary | ICD-10-CM

## 2011-12-19 DIAGNOSIS — K56609 Unspecified intestinal obstruction, unspecified as to partial versus complete obstruction: Secondary | ICD-10-CM

## 2011-12-19 DIAGNOSIS — D7289 Other specified disorders of white blood cells: Secondary | ICD-10-CM

## 2011-12-19 LAB — BASIC METABOLIC PANEL
BUN: 11 mg/dL (ref 6–23)
Creatinine, Ser: 0.94 mg/dL (ref 0.50–1.10)
GFR calc Af Amer: 70 mL/min — ABNORMAL LOW (ref >90)
GFR calc non Af Amer: 61 mL/min — ABNORMAL LOW (ref >90)
Potassium: 3.9 mEq/L (ref 3.5–5.1)

## 2011-12-19 LAB — CBC
Hemoglobin: 10.3 g/dL — ABNORMAL LOW (ref 12.0–15.0)
MCHC: 32.3 g/dL (ref 30.0–36.0)
Platelets: 566 10*3/uL — ABNORMAL HIGH (ref 150–400)
RDW: 14.8 % (ref 11.5–15.5)

## 2011-12-19 LAB — HEPARIN LEVEL (UNFRACTIONATED)
Heparin Unfractionated: 1.47 IU/mL — ABNORMAL HIGH (ref 0.30–0.70)
Heparin Unfractionated: 0.85 IU/mL — ABNORMAL HIGH (ref 0.30–0.70)

## 2011-12-19 MED ORDER — WARFARIN SODIUM 7.5 MG PO TABS
7.5000 mg | ORAL_TABLET | Freq: Once | ORAL | Status: AC
  Administered 2011-12-19: 7.5 mg via ORAL
  Filled 2011-12-19: qty 1

## 2011-12-19 MED ORDER — COUMADIN BOOK
Freq: Once | Status: AC
  Administered 2011-12-19: 18:00:00
  Filled 2011-12-19: qty 1

## 2011-12-19 MED ORDER — WARFARIN - PHARMACIST DOSING INPATIENT
Freq: Every day | Status: DC
  Administered 2011-12-20: 18:00:00

## 2011-12-19 MED ORDER — LOPERAMIDE HCL 1 MG/5ML PO LIQD
2.0000 mg | ORAL | Status: DC | PRN
  Administered 2011-12-19 (×3): 2 mg via ORAL
  Administered 2011-12-20: 1 mg via ORAL
  Administered 2011-12-20 – 2011-12-22 (×7): 2 mg via ORAL
  Filled 2011-12-19 (×8): qty 10

## 2011-12-19 MED ORDER — WARFARIN VIDEO
Freq: Once | Status: AC
  Administered 2011-12-19: 18:00:00

## 2011-12-19 MED ORDER — HEPARIN (PORCINE) IN NACL 100-0.45 UNIT/ML-% IJ SOLN
1100.0000 [IU]/h | INTRAMUSCULAR | Status: DC
  Administered 2011-12-21: 950 [IU]/h via INTRAVENOUS
  Administered 2011-12-22: 1100 [IU]/h via INTRAVENOUS
  Filled 2011-12-19 (×6): qty 250

## 2011-12-19 NOTE — Progress Notes (Addendum)
ANTICOAGULATION CONSULT NOTE - Follow Up Consult  Pharmacy Consult for Heparin (change from Full dose Lovenox 5/17 pm after 1st dose of Lovenox given) Indication: pulmonary embolus  Allergies  Allergen Reactions  . Hydralazine Anaphylaxis    Swelling tongue.  . Aspirin     REACTION: hives  . Crab (Shellfish Allergy) Hives  . Lisinopril     REACTION: angioedema    Patient Measurements: Height: 5\' 4"  (162.6 cm) Weight: 288 lb 11.2 oz (130.953 kg) IBW/kg (Calculated) : 54.7   Vital Signs: Temp: 98.6 F (37 C) (05/18 0636) Temp src: Oral (05/18 0636) BP: 109/73 mmHg (05/18 0636) Pulse Rate: 103  (05/18 0636)  Labs:  Basename 12/19/11 0740 12/19/11 0410 12/18/11 0430 12/17/11 0543  HGB -- 10.3* 9.5* --  HCT -- 31.9* 29.4* 29.6*  PLT -- 566* 438* 442*  APTT -- -- -- --  LABPROT -- -- -- --  INR -- -- -- --  HEPARINUNFRC 1.47* -- -- --  CREATININE -- 0.94 0.85 0.86  CKTOTAL -- -- -- --  CKMB -- -- -- --  TROPONINI -- -- -- --    Estimated Creatinine Clearance: 76 ml/min (by C-G formula based on Cr of 0.94).   Medical History: Past Medical History  Diagnosis Date  . Hyperlipidemia   . Hypertension   . Hyperparathyroidism   . Obesity   . Hyperglycemia   . Hypercalcemia   . Asthma   . Shortness of breath     wtih exertion   . Chronic kidney disease     right non functioning kidney   . Arthritis     left knee  and left shoulder     Medications:  Scheduled:     . antiseptic oral rinse  15 mL Mouth Rinse BID  . feeding supplement  30 mL Oral BID BM  . furosemide  80 mg Oral BID  . metoprolol tartrate  50 mg Oral BID  . pantoprazole  40 mg Oral Q1200  . potassium chloride  40 mEq Oral TID AC & HS  . DISCONTD: enoxaparin (LOVENOX) injection  1 mg/kg Subcutaneous Q12H  . DISCONTD: heparin  5,000 Units Subcutaneous Q8H   Infusions:     . heparin    . DISCONTD: heparin 1,600 Units/hr (12/19/11 0200)    Assessment:  71 yo female s/p small bowel  resection and primary hernia repair on 5/3  found to have PE per CT scan on 5/17  Was started on full dose Lovenox per pharmacy 5/17. This was a conversion from SQ heparin 5000 units q8h, last dose given 0700 5/17.   Was given Lovenox 140 mg sq x 1 @ 1726 then changed to Heparin infusion per pharmacy in case future surgical interventions are necessary  Heparin was started @ 0200 this am @ 1600 units/hour (no bolus)  Heparin level is supratherapeutic @ 1.47 @ 0800 today. May be seeing residual effects of Lovenox and/or sq heparin.  Verified rate on pump with RN. No bleeding reported. No problems with infusion or IV site reported   Goal of Therapy:  Heparin level 0.3-0.7 units/ml Monitor platelets by anticoagulation protocol: Yes   Plan:   Hold Heparin infusion for one hour  Resume @ decreased rate of 1000 units/hour  F/U level at 2000 today  Daily HL and CBC  Gwen Her PharmD  [phone] 12/19/2011 9:30 AM   Coumadin now being added Will give 7.5mg  tonight Daily INR Goal INR 2-3 Today is day 1 of 5 day  min overlap (will also need 2 d overlap w/tx INR, whichever time is longer) Coumadin book and video, counseling before d/c   Gwen Her PharmD  [phone] 12/19/2011 3:46 PM

## 2011-12-19 NOTE — Progress Notes (Signed)
ANTICOAGULATION CONSULT NOTE - Follow Up Consult  Pharmacy Consult for: IV Heparin Indication: pulmonary embolus  Allergies  Allergen Reactions  . Hydralazine Anaphylaxis    Swelling tongue.  . Aspirin     REACTION: hives  . Crab (Shellfish Allergy) Hives  . Lisinopril     REACTION: angioedema    Patient Measurements: Height: 5\' 4"  (162.6 cm) Weight: 288 lb 11.2 oz (130.953 kg) IBW/kg (Calculated) : 54.7   Vital Signs: Temp: 98.4 F (36.9 C) (05/18 1403) Temp src: Oral (05/18 1403) BP: 94/68 mmHg (05/18 1403) Pulse Rate: 93  (05/18 1403)  Labs:  Basename 12/19/11 1947 12/19/11 0740 12/19/11 0410 12/18/11 0430 12/17/11 0543  HGB -- -- 10.3* 9.5* --  HCT -- -- 31.9* 29.4* 29.6*  PLT -- -- 566* 438* 442*  APTT -- -- -- -- --  LABPROT -- -- -- -- --  INR -- -- -- -- --  HEPARINUNFRC 0.85* 1.47* -- -- --  CREATININE -- -- 0.94 0.85 0.86  CKTOTAL -- -- -- -- --  CKMB -- -- -- -- --  TROPONINI -- -- -- -- --    Estimated Creatinine Clearance: 76 ml/min (by C-G formula based on Cr of 0.94).  Assessment:  71 yo female s/p small bowel resection and primary hernia repair on 5/3  found to have PE per CT scan on 5/17.  Lovenox started and now transitioned to IV heparin in case of future invasive procedures.    Heparin level supratherapeutic this AM, dose reduced to now 1000 units/hr and heparin level at 8PM tonight is still elevated at 0.85.    Communicated with RN, no bleeding/complications noted  Goal of Therapy:  Heparin level 0.3-0.7 units/ml Monitor platelets by anticoagulation protocol: Yes   Plan:   Decrease heparin to 750 units/hr  F/u heparin level with AM labs.

## 2011-12-19 NOTE — Progress Notes (Addendum)
Patient ID: Tina Chase, female   DOB: January 11, 1941, 71 y.o.   MRN: [id number] [facility] Surgery Progress Note:   15 Days Post-Op  Subjective: Mental status is clear.  No complaints.  Seated Objective: Vital signs in last 24 hours: Temp:  [97.7 F (36.5 C)-98.6 F (37 C)] 98.6 F (37 C) (05/18 0636) Pulse Rate:  [99-103] 103  (05/18 0636) Resp:  [18-20] 18  (05/18 0636) BP: (104-109)/(68-75) 109/73 mmHg (05/18 0636) SpO2:  [90 %-100 %] 100 % (05/18 0636) Weight:  [288 lb 11.2 oz (130.953 kg)] 288 lb 11.2 oz (130.953 kg) (05/18 0636)  Intake/Output from previous day: 05/17 0701 - 05/18 0700 In: 240 [P.O.:240] Out: 2000 [Urine:2000] Intake/Output this shift: Total I/O In: -  Out: 100 [Drains:100]  Physical Exam: Work of breathing is  Not labored.  PE by CTA yesterday.  Wound VAC on Right side.    Lab Results:  Results for orders placed during the hospital encounter of 12/03/11 (from the past 48 hour(s))  CLOSTRIDIUM DIFFICILE BY PCR     Status: Normal   Collection Time   12/17/11  2:33 PM      Component Value Range Comment   C difficile by pcr NEGATIVE  NEGATIVE    CBC     Status: Abnormal   Collection Time   12/18/11  4:30 AM      Component Value Range Comment   WBC 12.8 (*) 4.0 - 10.5 (K/uL)    RBC 3.22 (*) 3.87 - 5.11 (MIL/uL)    Hemoglobin 9.5 (*) 12.0 - 15.0 (g/dL)    HCT 78.2 (*) 95.6 - 46.0 (%)    MCV 91.3  78.0 - 100.0 (fL)    MCH 29.5  26.0 - 34.0 (pg)    MCHC 32.3  30.0 - 36.0 (g/dL)    RDW 21.3  08.6 - 57.8 (%)    Platelets 438 (*) 150 - 400 (K/uL)   BASIC METABOLIC PANEL     Status: Abnormal   Collection Time   12/18/11  4:30 AM      Component Value Range Comment   Sodium 140  135 - 145 (mEq/L)    Potassium 3.6  3.5 - 5.1 (mEq/L)    Chloride 99  96 - 112 (mEq/L)    CO2 31  19 - 32 (mEq/L)    Glucose, Bld 101 (*) 70 - 99 (mg/dL)    BUN 12  6 - 23 (mg/dL)    Creatinine, Ser 4.69  0.50 - 1.10 (mg/dL)    Calcium 9.4  8.4 - 10.5 (mg/dL)    GFR calc  non Af Amer 68 (*) >90 (mL/min)    GFR calc Af Amer 79 (*) >90 (mL/min)   BASIC METABOLIC PANEL     Status: Abnormal   Collection Time   12/19/11  4:10 AM      Component Value Range Comment   Sodium 137  135 - 145 (mEq/L)    Potassium 3.9  3.5 - 5.1 (mEq/L)    Chloride 96  96 - 112 (mEq/L)    CO2 31  19 - 32 (mEq/L)    Glucose, Bld 98  70 - 99 (mg/dL)    BUN 11  6 - 23 (mg/dL)    Creatinine, Ser 6.29  0.50 - 1.10 (mg/dL)    Calcium 9.9  8.4 - 10.5 (mg/dL)    GFR calc non Af Amer 61 (*) >90 (mL/min)    GFR calc Af Amer 70 (*) >90 (mL/min)  CBC     Status: Abnormal   Collection Time   12/19/11  4:10 AM      Component Value Range Comment   WBC 11.7 (*) 4.0 - 10.5 (K/uL)    RBC 3.55 (*) 3.87 - 5.11 (MIL/uL)    Hemoglobin 10.3 (*) 12.0 - 15.0 (g/dL)    HCT 16.1 (*) 09.6 - 46.0 (%)    MCV 89.9  78.0 - 100.0 (fL)    MCH 29.0  26.0 - 34.0 (pg)    MCHC 32.3  30.0 - 36.0 (g/dL)    RDW 04.5  40.9 - 81.1 (%)    Platelets 566 (*) 150 - 400 (K/uL)   HEPARIN LEVEL (UNFRACTIONATED)     Status: Abnormal   Collection Time   12/19/11  7:40 AM      Component Value Range Comment   Heparin Unfractionated 1.47 (*) 0.30 - 0.70 (IU/mL)     Radiology/Results: Ct Angio Chest W/cm &/or Wo Cm  12/18/2011  *RADIOLOGY REPORT*  Clinical Data: Suspected pulmonary embolism on the same day CT abdomen pelvis.  CT ANGIOGRAPHY CHEST  Technique:  Multidetector CT imaging of the chest using the standard protocol during bolus administration of intravenous contrast. Multiplanar reconstructed images including MIPs were obtained and reviewed to evaluate the vascular anatomy.  Contrast:  70mL OMNIPAQUE IOHEXOL 350 MG/ML SOLN  Comparison: CT 12/18/2011  Findings: There is a filling defect within the main pulmonary artery which spans the branch point of the left and right main pulmonary artery.  There is a large tubular filling defect within the right lower lobe pulmonary arteries.  There is smaller tubular filling defect  within the left lower lobe pulmonary arteries. These findings are consistent acute thrombi emboli.  The overall clot burden is moderate to severe.  There is no evidence of right ventricular strain.  No pleural fluid or pneumothorax.  Airways are normal.  No evidence of pulmonary infarction.  No aggressive osseous lesions.  IMPRESSION:   Acute pulmonary thromboembolism spanning the left and right main pulmonary arteries and extending into the lower lobe pulmonary arteries.  Clot burden is moderate to severe.  Findings conveyed to Jenita Seashore PA and the Collene Gobble patient's nurse on 12/18/2011 at 1430 hours.  Original Report Authenticated By: Genevive Bi, M.D.   Ct Abdomen Pelvis W Contrast  12/18/2011  *RADIOLOGY REPORT*  Clinical Data: Recent hernia at this repair.  Elevated white blood cell count  CT ABDOMEN AND PELVIS WITH CONTRAST  Technique:  Multidetector CT imaging of the abdomen and pelvis was performed following the standard protocol during bolus administration of intravenous contrast.  Contrast: 100 ml Omnipaque 300  Comparison: CT 12/03/2011  Findings: Lung bases are clear.  No pericardial fluid.  There is a pulmonary embolism within the right lower lobe pulmonary artery. More central pulmonary arteries are not evaluated.  No focal hepatic lesion.  There is no fluid collection or abscess within the upper peritoneal space.  There is an open ventral wound. There is a linear granulation / inflammation  process with scattered pockets of gas in the subcutaneous tissues within the right abdominal wall which measures 12 cm x 3 cm.  The small bowel and colon appear normal.  No bowel obstruction. The left kidney is normal.  The right kidney is absent.  The spleen adrenal glands are normal.  No free fluid the pelvis.  The bladder and uterus appear normal. The superior to the uterus and along the left ovary is a  4.1  x 2.3 s 2.1 cm fluid collection.  This has an enhancing rim and is new from prior.  No  fluid the posterior cul-de-sac.  IMPRESSION:  1.  Small fluid collections superior to the uterus is new from prior has a thin enhancing rim  consistent with a  small pelvic abscess.  2.  Open ventral wound.  Within the right subcutaneous tissues of the abdominal wall there is a large inflammatory reaction with gas interspersed measuring up to 12 cm which extends to the ventral wound. 3.  Acute pulmonary embolism within the right lower lobe pulmonary artery.  Recommend CTA thorax for further evaluation of the more central pulmonary arteries.  Findings discussed with Will Marlyne Beards 03/17/2012 at 1600 hours  Original Report Authenticated By: Genevive Bi, M.D.    Anti-infectives: Anti-infectives     Start     Dose/Rate Route Frequency Ordered Stop   12/14/11 1830   ertapenem (INVANZ) 1 g in sodium chloride 0.9 % 50 mL IVPB  Status:  Discontinued        1 g 100 mL/hr over 30 Minutes Intravenous Every 24 hours 12/14/11 1734 12/17/11 1247   12/05/11 1800   ertapenem (INVANZ) 1 g in sodium chloride 0.9 % 50 mL IVPB  Status:  Discontinued        1 g 100 mL/hr over 30 Minutes Intravenous Every 24 hours 12/04/11 2335 12/14/11 1250   12/04/11 1000   ertapenem (INVANZ) 1 g in sodium chloride 0.9 % 50 mL IVPB  Status:  Discontinued        1 g 100 mL/hr over 30 Minutes Intravenous Every 24 hours 12/04/11 0806 12/04/11 2224          Assessment/Plan: Problem List: Patient Active Problem List  Diagnoses  . HYPERPARATHYROIDISM NOS  . HYPERLIPIDEMIA  . HYPERGLYCEMIA  . Chest pain  . DYSPNEA  . S/p nephrectomy  . Routine health maintenance  . 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 Asthma  . Small bowel obstruction due to strangulated incisional hernia s/p expl lap, small bowel resection, primary hernia repair 12/04/11  . Respiratory failure following trauma and surgery  . Tachycardia  . Acute on chronic diastolic CHF (congestive heart failure)  . Pulmonary embolism    PE complicating postop course  after SBO from incarcerated hernia.  Heparin drip in progress.  No evidence of increased WOB or breathing compromise.  15 Days Post-Op    LOS: 16 days   Matt B. Daphine Deutscher, MD, [facility] [phone] beeper [phone]  12/19/2011 8:54 AM

## 2011-12-19 NOTE — Progress Notes (Signed)
Patient ID: Tina Chase, female   DOB: 04/07/1941, 71 y.o.   MRN: [id number]  Subjective: No events overnight. Patient denies chest pain, shortness of breath, abdominal pain.   Objective:  Vital signs in last 24 hours:  Filed Vitals:   12/18/11 2117 12/18/11 2200 12/19/11 0636 12/19/11 1403  BP: 104/68 115/80 109/73 94/68  Pulse: 103 72 103 93  Temp: 98.1 F (36.7 C)  98.6 F (37 C) 98.4 F (36.9 C)  TempSrc: Oral  Oral Oral  Resp: 20  18 20   Height:      Weight:   130.953 kg (288 lb 11.2 oz)   SpO2: 90%  100% 97%    Intake/Output from previous day:   Intake/Output Summary (Last 24 hours) at 12/19/11 1407 Last data filed at 12/19/11 1342  Gross per 24 hour  Intake    336 ml  Output   1000 ml  Net   -664 ml    Physical Exam: General: Alert, awake, oriented x3, in no acute distress. HEENT: No bruits, no goiter. Moist mucous membranes, no scleral icterus, no conjunctival pallor. Heart: Regular rhythm but tachycardic, S1/S2 +, no murmurs, rubs, gallops. Lungs: Clear to auscultation bilaterally. No wheezing, no rhonchi, no rales.  Abdomen: Soft, nontender, nondistended, positive bowel sounds. Extremities: No clubbing or cyanosis, no pitting edema,  positive pedal pulses. Neuro: Grossly nonfocal.  Lab Results:  Lab 12/19/11 0410 12/18/11 0430 12/17/11 0543 12/16/11 0416 12/15/11 0610 12/13/11 0440  WBC 11.7* 12.8* 12.5* 12.6* 13.1* --  HGB 10.3* 9.5* 9.4* 9.3* 10.5* --  HCT 31.9* 29.4* 29.6* 28.7* 32.4* --  PLT 566* 438* 442* 357 379 --  MCV 89.9 91.3 91.1 90.5 91.0 --  MCH 29.0 29.5 28.9 29.3 29.5 --  MCHC 32.3 32.3 31.8 32.4 32.4 --  RDW 14.8 15.0 14.9 14.7 14.4 --  LYMPHSABS -- -- -- -- -- 2.6  MONOABS -- -- -- -- -- 1.6*  EOSABS -- -- -- -- -- 0.4  BASOSABS -- -- -- -- -- 0.0  BANDABS -- -- -- -- -- --    Lab 12/19/11 0410 12/18/11 0430 12/17/11 0543 12/16/11 0416 12/15/11 0610 12/14/11 0315  NA 137 140 139 140 140 --  K 3.9 3.6 3.7 3.8 3.1* --  CL 96  99 99 101 99 --  CO2 31 31 32 31 32 --  GLUCOSE 98 101* 87 82 111* --  BUN 11 12 11 8 8  --  CREATININE 0.94 0.85 0.86 0.73 0.70 --  CALCIUM 9.9 9.4 9.4 9.1 9.1 --  MG -- -- -- -- 1.3* 1.4*   Studies/Results:  Ct Angio Chest W/cm &/or Wo Cm 12/18/2011    IMPRESSION:    Acute pulmonary thromboembolism spanning the left and right main pulmonary arteries and extending into the lower lobe pulmonary arteries.   Clot burden is moderate to severe.   Ct Abdomen Pelvis W Contrast 12/18/2011    IMPRESSION:   1.  Small fluid collections superior to the uterus is new from prior has a thin enhancing rim  consistent with a  small pelvic abscess.   2.  Open ventral wound.  Within the right subcutaneous tissues of the abdominal wall there is a large inflammatory reaction with gas interspersed measuring up to 12 cm which extends to the ventral wound.  3.  Acute pulmonary embolism within the right lower lobe pulmonary artery.  Recommend CTA thorax for further evaluation of the more central pulmonary arteries.    Medications: Scheduled Meds:  Continuous Infusions:   . heparin 1,000 Units/hr (12/19/11 1023)  . DISCONTD: heparin 1,600 Units/hr (12/19/11 0200)   PRN Meds:.HYDROcodone-acetaminophen, iohexol, iohexol, levalbuterol, loperamide, metoprolol, morphine injection, ondansetron (ZOFRAN) IV, ondansetron, oxyCODONE-acetaminophen, sodium chloride, DISCONTD: loperamide  Assessment/Plan:  Principal Problem:  *Small bowel obstruction due to strangulated incisional hernia s/p expl lap, small bowel resection, primary hernia repair 12/04/11  - surgery managing and will continue to follow up on recommendations   Active Problems:  PE - noted on CT CHEST - heparin in progress and will order transition to coumadin today  HYPERLIPIDEMIA  - this remains stable   HYPERPARATHYROIDISM NOS  - stable   Asthma  - remains clinically stable  - pt maintaining oxygen saturation > 93% on RA   Respiratory  failure following trauma and surgery  - clinically stable at this point  - will continue to monitor vitals per floor protocol   Tachycardia  - continue Metoprolol to 50 mg BID  - continue to monitor vitals per floor protocol   Acute on chronic diastolic CHF (congestive heart failure)  - Patient has a history of grade 1 diastolic dysfunction and likely with volume resuscitation  - We have started diuretics and following intake and output.  - Pt continues to diurese well and creatinine is stable and within normal limits  - BNP continues to trend down   Diarrhea  - Not C. difficile. Have started Imodium.  - this is now improving   Disposition  - Needs short-term skilled nursing.   EDUCATION  - test results and diagnostic studies were discussed with patient  - patient verbalized the understanding  - questions were answered at the bedside and contact information was provided for additional questions or concerns     LOS: 16 days   MAGICK-Adrie Picking 12/19/2011, 2:07 PM  [facility] Pager: [phone]

## 2011-12-20 DIAGNOSIS — K56609 Unspecified intestinal obstruction, unspecified as to partial versus complete obstruction: Secondary | ICD-10-CM

## 2011-12-20 DIAGNOSIS — I5021 Acute systolic (congestive) heart failure: Secondary | ICD-10-CM

## 2011-12-20 DIAGNOSIS — D7289 Other specified disorders of white blood cells: Secondary | ICD-10-CM

## 2011-12-20 DIAGNOSIS — I5031 Acute diastolic (congestive) heart failure: Secondary | ICD-10-CM

## 2011-12-20 LAB — PROTIME-INR: Prothrombin Time: 14.5 seconds (ref 11.6–15.2)

## 2011-12-20 LAB — CBC
Hemoglobin: 9.1 g/dL — ABNORMAL LOW (ref 12.0–15.0)
MCH: 29.2 pg (ref 26.0–34.0)
Platelets: 491 10*3/uL — ABNORMAL HIGH (ref 150–400)
RBC: 3.12 MIL/uL — ABNORMAL LOW (ref 3.87–5.11)

## 2011-12-20 LAB — HEPARIN LEVEL (UNFRACTIONATED)
Heparin Unfractionated: 0.35 IU/mL (ref 0.30–0.70)
Heparin Unfractionated: 0.33 IU/mL (ref 0.30–0.70)

## 2011-12-20 MED ORDER — WARFARIN SODIUM 7.5 MG PO TABS
7.5000 mg | ORAL_TABLET | Freq: Once | ORAL | Status: AC
  Administered 2011-12-20: 7.5 mg via ORAL
  Filled 2011-12-20: qty 1

## 2011-12-20 NOTE — Progress Notes (Signed)
Patient ID: Tina Chase, female   DOB: [birth year], 71 y.o.   MRN: [id number] [facility] Progress Note:   16 Days Post-Op  Subjective: Mental status is clear.  No complaints.   Objective: Vital signs in last 24 hours: Temp:  [98.2 F (36.8 C)-99 F (37.2 C)] 99 F (37.2 C) (05/19 0505) Pulse Rate:  [93-102] 101  (05/19 0505) Resp:  [20] 20  (05/19 0505) BP: (94-120)/(68-74) 114/71 mmHg (05/19 0505) SpO2:  [97 %] 97 % (05/19 0505) Weight:  [296 lb 15.4 oz (134.7 kg)] 296 lb 15.4 oz (134.7 kg) (05/19 0505)  Intake/Output from previous day: 05/18 0701 - 05/19 0700 In: 730.3 [P.O.:600; I.V.:130.3] Out: 100 [Drains:100] Intake/Output this shift:    Physical Exam: Work of breathing is  Not labored.  Sitting up in chair as she was yesterday.  This position in the post op period may have contributed to her PE.  Wound VAC on the right sided wound.  Midline is packed  Lab Results:  Results for orders placed during the hospital encounter of 12/03/11 (from the past 48 hour(s))  BASIC METABOLIC PANEL     Status: Abnormal   Collection Time   12/19/11  4:10 AM      Component Value Range Comment   Sodium 137  135 - 145 (mEq/L)    Potassium 3.9  3.5 - 5.1 (mEq/L)    Chloride 96  96 - 112 (mEq/L)    CO2 31  19 - 32 (mEq/L)    Glucose, Bld 98  70 - 99 (mg/dL)    BUN 11  6 - 23 (mg/dL)    Creatinine, Ser 4.09  0.50 - 1.10 (mg/dL)    Calcium 9.9  8.4 - 10.5 (mg/dL)    GFR calc non Af Amer 61 (*) >90 (mL/min)    GFR calc Af Amer 70 (*) >90 (mL/min)   CBC     Status: Abnormal   Collection Time   12/19/11  4:10 AM      Component Value Range Comment   WBC 11.7 (*) 4.0 - 10.5 (K/uL)    RBC 3.55 (*) 3.87 - 5.11 (MIL/uL)    Hemoglobin 10.3 (*) 12.0 - 15.0 (g/dL)    HCT 81.1 (*) 91.4 - 46.0 (%)    MCV 89.9  78.0 - 100.0 (fL)    MCH 29.0  26.0 - 34.0 (pg)    MCHC 32.3  30.0 - 36.0 (g/dL)    RDW 78.2  95.6 - 21.3 (%)    Platelets 566 (*) 150 - 400 (K/uL)   HEPARIN LEVEL  (UNFRACTIONATED)     Status: Abnormal   Collection Time   12/19/11  7:40 AM      Component Value Range Comment   Heparin Unfractionated 1.47 (*) 0.30 - 0.70 (IU/mL)   HEPARIN LEVEL (UNFRACTIONATED)     Status: Abnormal   Collection Time   12/19/11  7:47 PM      Component Value Range Comment   Heparin Unfractionated 0.85 (*) 0.30 - 0.70 (IU/mL)   CBC     Status: Abnormal   Collection Time   12/20/11  5:14 AM      Component Value Range Comment   WBC 9.2  4.0 - 10.5 (K/uL)    RBC 3.12 (*) 3.87 - 5.11 (MIL/uL)    Hemoglobin 9.1 (*) 12.0 - 15.0 (g/dL)    HCT 08.6 (*) 57.8 - 46.0 (%)    MCV 92.6  78.0 - 100.0 (fL)  MCH 29.2  26.0 - 34.0 (pg)    MCHC 31.5  30.0 - 36.0 (g/dL)    RDW 16.1  09.6 - 04.5 (%)    Platelets 491 (*) 150 - 400 (K/uL)   HEPARIN LEVEL (UNFRACTIONATED)     Status: Normal   Collection Time   12/20/11  5:14 AM      Component Value Range Comment   Heparin Unfractionated 0.35  0.30 - 0.70 (IU/mL)   PROTIME-INR     Status: Normal   Collection Time   12/20/11  5:14 AM      Component Value Range Comment   Prothrombin Time 14.5  11.6 - 15.2 (seconds)    INR 1.11  0.00 - 1.49      Radiology/Results: Ct Angio Chest W/cm &/or Wo Cm  12/18/2011  *RADIOLOGY REPORT*  Clinical Data: Suspected pulmonary embolism on the same day CT abdomen pelvis.  CT ANGIOGRAPHY CHEST  Technique:  Multidetector CT imaging of the chest using the standard protocol during bolus administration of intravenous contrast. Multiplanar reconstructed images including MIPs were obtained and reviewed to evaluate the vascular anatomy.  Contrast:  70mL OMNIPAQUE IOHEXOL 350 MG/ML SOLN  Comparison: CT 12/18/2011  Findings: There is a filling defect within the main pulmonary artery which spans the branch point of the left and right main pulmonary artery.  There is a large tubular filling defect within the right lower lobe pulmonary arteries.  There is smaller tubular filling defect within the left lower lobe  pulmonary arteries. These findings are consistent acute thrombi emboli.  The overall clot burden is moderate to severe.  There is no evidence of right ventricular strain.  No pleural fluid or pneumothorax.  Airways are normal.  No evidence of pulmonary infarction.  No aggressive osseous lesions.  IMPRESSION:   Acute pulmonary thromboembolism spanning the left and right main pulmonary arteries and extending into the lower lobe pulmonary arteries.  Clot burden is moderate to severe.  Findings conveyed to Jenita Seashore PA and the Collene Gobble patient's nurse on 12/18/2011 at 1430 hours.  Original Report Authenticated By: Genevive Bi, M.D.   Ct Abdomen Pelvis W Contrast  12/18/2011  *RADIOLOGY REPORT*  Clinical Data: Recent hernia at this repair.  Elevated white blood cell count  CT ABDOMEN AND PELVIS WITH CONTRAST  Technique:  Multidetector CT imaging of the abdomen and pelvis was performed following the standard protocol during bolus administration of intravenous contrast.  Contrast: 100 ml Omnipaque 300  Comparison: CT 12/03/2011  Findings: Lung bases are clear.  No pericardial fluid.  There is a pulmonary embolism within the right lower lobe pulmonary artery. More central pulmonary arteries are not evaluated.  No focal hepatic lesion.  There is no fluid collection or abscess within the upper peritoneal space.  There is an open ventral wound. There is a linear granulation / inflammation  process with scattered pockets of gas in the subcutaneous tissues within the right abdominal wall which measures 12 cm x 3 cm.  The small bowel and colon appear normal.  No bowel obstruction. The left kidney is normal.  The right kidney is absent.  The spleen adrenal glands are normal.  No free fluid the pelvis.  The bladder and uterus appear normal. The superior to the uterus and along the left ovary is a  4.1 x 2.3 s 2.1 cm fluid collection.  This has an enhancing rim and is new from prior.  No fluid the posterior  cul-de-sac.  IMPRESSION:  1.  Small fluid collections superior to the uterus is new from prior has a thin enhancing rim  consistent with a  small pelvic abscess.  2.  Open ventral wound.  Within the right subcutaneous tissues of the abdominal wall there is a large inflammatory reaction with gas interspersed measuring up to 12 cm which extends to the ventral wound. 3.  Acute pulmonary embolism within the right lower lobe pulmonary artery.  Recommend CTA thorax for further evaluation of the more central pulmonary arteries.  Findings discussed with Will Marlyne Beards 03/17/2012 at 1600 hours  Original Report Authenticated By: Genevive Bi, M.D.    Anti-infectives: Anti-infectives     Start     Dose/Rate Route Frequency Ordered Stop   12/14/11 1830   ertapenem (INVANZ) 1 g in sodium chloride 0.9 % 50 mL IVPB  Status:  Discontinued        1 g 100 mL/hr over 30 Minutes Intravenous Every 24 hours 12/14/11 1734 12/17/11 1247   12/05/11 1800   ertapenem (INVANZ) 1 g in sodium chloride 0.9 % 50 mL IVPB  Status:  Discontinued        1 g 100 mL/hr over 30 Minutes Intravenous Every 24 hours 12/04/11 2335 12/14/11 1250   12/04/11 1000   ertapenem (INVANZ) 1 g in sodium chloride 0.9 % 50 mL IVPB  Status:  Discontinued        1 g 100 mL/hr over 30 Minutes Intravenous Every 24 hours 12/04/11 0806 12/04/11 2224          Assessment/Plan: Problem List: Patient Active Problem List  Diagnoses  . HYPERPARATHYROIDISM NOS  . HYPERLIPIDEMIA  . HYPERGLYCEMIA  . Chest pain  . DYSPNEA  . S/p nephrectomy  . Routine health maintenance  . 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 Asthma  . Small bowel obstruction due to strangulated incisional hernia s/p expl lap, small bowel resection, primary hernia repair 12/04/11  . Respiratory failure following trauma and surgery  . Tachycardia  . Acute on chronic diastolic CHF (congestive heart failure)  . Pulmonary embolism    No change since yesterday.  Her obesity and relative  immobility are compromising her recovery.   16 Days Post-Op    LOS: 17 days   Matt B. Daphine Deutscher, MD, [facility] [phone] beeper [phone]  12/20/2011 8:04 AM

## 2011-12-20 NOTE — Progress Notes (Addendum)
Patient ID: Tina Chase, female   DOB: 11-02-1940, 71 y.o.   MRN: [id number]  Subjective: No events overnight. Patient denies chest pain, shortness of breath, abdominal pain.  Objective:  Vital signs in last 24 hours:  Filed Vitals:   12/19/11 0636 12/19/11 1403 12/19/11 2144 12/20/11 0505  BP: 109/73 94/68 120/74 114/71  Pulse: 103 93 102 101  Temp: 98.6 F (37 C) 98.4 F (36.9 C) 98.2 F (36.8 C) 99 F (37.2 C)  TempSrc: Oral Oral Oral Oral  Resp: 18 20 20 20   Height:      Weight: 130.953 kg (288 lb 11.2 oz)   134.7 kg (296 lb 15.4 oz)  SpO2: 100% 97% 97% 97%    Intake/Output from previous day:   Intake/Output Summary (Last 24 hours) at 12/20/11 0910 Last data filed at 12/19/11 1800  Gross per 24 hour  Intake  730.3 ml  Output      0 ml  Net  730.3 ml    Physical Exam: General: Alert, awake, oriented x3, in no acute distress. HEENT: No bruits, no goiter. Moist mucous membranes, no scleral icterus, no conjunctival pallor. Heart: Regular rhythm but still tachycardic, S1/S2 +, no murmurs, rubs, gallops. Lungs: Clear to auscultation bilaterally. No wheezing, no rhonchi, no rales.  Abdomen: Soft, nontender, nondistended, positive bowel sounds. Extremities: No clubbing or cyanosis, no pitting edema,  positive pedal pulses. Neuro: Grossly nonfocal.  Lab Results:  Lab 12/20/11 0514 12/19/11 0410 12/18/11 0430 12/17/11 0543 12/16/11 0416  WBC 9.2 11.7* 12.8* 12.5* 12.6*  HGB 9.1* 10.3* 9.5* 9.4* 9.3*  HCT 28.9* 31.9* 29.4* 29.6* 28.7*  PLT 491* 566* 438* 442* 357    Lab 12/19/11 0410 12/18/11 0430 12/17/11 0543 12/16/11 0416 12/15/11 0610 12/14/11 0315  NA 137 140 139 140 140 --  K 3.9 3.6 3.7 3.8 3.1* --  CL 96 99 99 101 99 --  CO2 31 31 32 31 32 --  GLUCOSE 98 101* 87 82 111* --  BUN 11 12 11 8 8  --  CREATININE 0.94 0.85 0.86 0.73 0.70 --  CALCIUM 9.9 9.4 9.4 9.1 9.1 --    Lab 12/20/11 0514  INR 1.11  PROTIME --   Recent Results (from the past 240  hour(s))  CLOSTRIDIUM DIFFICILE BY PCR     Status: Normal   Collection Time   12/11/11  6:18 PM      Component Value Range Status Comment   C difficile by pcr NEGATIVE  NEGATIVE  Final   CULTURE, BLOOD (ROUTINE X 2)     Status: Normal   Collection Time   12/12/11 12:00 PM      Component Value Range Status Comment   Specimen Description BLOOD RIGHT ARM   Final    Special Requests     Final    Value: BOTTLES DRAWN AEROBIC AND ANAEROBIC 6CCBOTH BOTTLES   Culture  Setup Time [id number]   Final    Culture NO GROWTH 5 DAYS   Final    Report Status 12/18/2011 FINAL   Final   CULTURE, BLOOD (ROUTINE X 2)     Status: Normal   Collection Time   12/12/11 12:15 PM      Component Value Range Status Comment   Specimen Description BLOOD RIGHT HAND   Final    Special Requests     Final    Value: BOTTLES DRAWN AEROBIC AND ANAEROBIC 6CC BOTH BOTTLES    Culture  Setup Time [id number]   Final  Culture NO GROWTH 5 DAYS   Final    Report Status 12/18/2011 FINAL   Final   URINE CULTURE     Status: Normal   Collection Time   12/15/11  2:06 PM      Component Value Range Status Comment   Specimen Description URINE, CLEAN CATCH   Final    Special Requests Invanz Normal   Final    Culture  Setup Time [id number]   Final    Colony Count NO GROWTH   Final    Culture NO GROWTH   Final    Report Status 12/16/2011 FINAL   Final   CLOSTRIDIUM DIFFICILE BY PCR     Status: Normal   Collection Time   12/17/11  2:33 PM      Component Value Range Status Comment   C difficile by pcr NEGATIVE  NEGATIVE  Final     Studies/Results:  Ct Angio Chest W/cm &/or Wo Cm 12/18/2011   IMPRESSION:    Acute pulmonary thromboembolism spanning the left and right main pulmonary arteries and extending into the lower lobe pulmonary arteries.  Clot burden is moderate to severe.  Findings conveyed to Jenita Seashore PA and the Collene Gobble patient's nurse on 12/18/2011 at 1430 hours.   Ct Abdomen Pelvis W  Contrast 12/18/2011  IMPRESSION:   1.  Small fluid collections superior to the uterus is new from prior has a thin enhancing rim  consistent with a  small pelvic abscess.  2.  Open ventral wound.  Within the right subcutaneous tissues of the abdominal wall there is a large inflammatory reaction with gas interspersed measuring up to 12 cm which extends to the ventral wound. 3.  Acute pulmonary embolism within the right lower lobe pulmonary artery.  Recommend CTA thorax for further evaluation of the more central pulmonary arteries.  Findings discussed with Will Marlyne Beards 03/17/2012 at 1600 hours    Medications: Scheduled Meds:   . antiseptic oral rinse  15 mL Mouth Rinse BID  . coumadin book   Does not apply Once  . feeding supplement  30 mL Oral BID BM  . furosemide  80 mg Oral BID  . metoprolol tartrate  50 mg Oral BID  . pantoprazole  40 mg Oral Q1200  . potassium chloride  40 mEq Oral TID AC & HS  . warfarin  7.5 mg Oral ONCE-1800  . warfarin   Does not apply Once  . Warfarin - Pharmacist Dosing Inpatient   Does not apply q1800   Continuous Infusions:   . heparin 1,000 Units/hr (12/19/11 1023)  . DISCONTD: heparin 1,600 Units/hr (12/19/11 0200)   PRN Meds:.HYDROcodone-acetaminophen, levalbuterol, loperamide, metoprolol, morphine injection, ondansetron (ZOFRAN) IV, ondansetron, oxyCODONE-acetaminophen, sodium chloride, DISCONTD: loperamide  Assessment/Plan:  Principal Problem:  *Small bowel obstruction due to strangulated incisional hernia s/p expl lap, small bowel resection, primary hernia repair 12/04/11  - surgery managing and will continue to follow up on recommendations   Active Problems:  PE  - noted on CT CHEST  - heparin in progress with transition to coumadin  HYPERLIPIDEMIA  - this remains stable   HYPERPARATHYROIDISM NOS  - stable   Asthma  - remains clinically stable  - pt maintaining oxygen saturation > 93% on RA   Respiratory failure following trauma and  surgery  - clinically stable at this point  - will continue to monitor vitals per floor protocol   Tachycardia  - continue Metoprolol to 50 mg BID  - continue to monitor vitals  per floor protocol   Acute on chronic diastolic CHF (congestive heart failure)  - Patient has a history of grade 1 diastolic dysfunction and likely with volume resuscitation  - We have started diuretics and following intake and output.  - Pt continues to diurese well and creatinine is stable and within normal limits  - BNP continues to trend down   Diarrhea  - Not C. difficile. Have started Imodium.  - this is now improving   Disposition  - Needs short-term skilled nursing.   EDUCATION  - test results and diagnostic studies were discussed with patient  - patient verbalized the understanding  - questions were answered at the bedside and contact information was provided for additional questions or concerns    LOS: 17 days   MAGICK-Kenika Sahm 12/20/2011, 9:10 AM  [facility] Pager: [phone]

## 2011-12-20 NOTE — Progress Notes (Signed)
ANTICOAGULATION CONSULT NOTE - Follow Up Consult  Pharmacy Consult for Heparin Indication: pulmonary embolus  Allergies  Allergen Reactions  . Hydralazine Anaphylaxis    Swelling tongue.  . Aspirin     REACTION: hives  . Crab (Shellfish Allergy) Hives  . Lisinopril     REACTION: angioedema    Patient Measurements: Height: 5\' 4"  (162.6 cm) Weight: 296 lb 15.4 oz (134.7 kg) IBW/kg (Calculated) : 54.7  Heparin Dosing Weight:   Vital Signs: Temp: 99 F (37.2 C) (05/19 0505) Temp src: Oral (05/19 0505) BP: 114/71 mmHg (05/19 0505) Pulse Rate: 101  (05/19 0505)  Labs:  Alvira Philips 12/20/11 0514 12/19/11 1947 12/19/11 0740 12/19/11 0410 12/18/11 0430  HGB 9.1* -- -- 10.3* --  HCT 28.9* -- -- 31.9* 29.4*  PLT 491* -- -- 566* 438*  APTT -- -- -- -- --  LABPROT 14.5 -- -- -- --  INR 1.11 -- -- -- --  HEPARINUNFRC 0.35 0.85* 1.47* -- --  CREATININE -- -- -- 0.94 0.85  CKTOTAL -- -- -- -- --  CKMB -- -- -- -- --  TROPONINI -- -- -- -- --    Estimated Creatinine Clearance: 77.3 ml/min (by C-G formula based on Cr of 0.94).   Medications:  Infusions:    . heparin 1,000 Units/hr (12/19/11 1023)  . DISCONTD: heparin 1,600 Units/hr (12/19/11 0200)    Assessment: Patient with heparin level at goal.  No issues per RN.  Goal of Therapy:  Heparin level 0.3-0.7 units/ml Monitor platelets by anticoagulation protocol: Yes   Plan:  Patient heparin level at goal, will recheck at 1200  Darlina Guys, Jacquenette Shone Crowford 12/20/2011,6:15 AM

## 2011-12-20 NOTE — Progress Notes (Signed)
ANTICOAGULATION CONSULT NOTE - Follow Up Consult  Pharmacy Consult for Heparin/Coumadin Indication: pulmonary embolus  Allergies  Allergen Reactions  . Hydralazine Anaphylaxis    Swelling tongue.  . Aspirin     REACTION: hives  . Crab (Shellfish Allergy) Hives  . Lisinopril     REACTION: angioedema    Patient Measurements: Height: 5\' 4"  (162.6 cm) Weight: 296 lb 15.4 oz (134.7 kg) IBW/kg (Calculated) : 54.7   Vital Signs: Temp: 99 F (37.2 C) (05/19 0505) Temp src: Oral (05/19 0505) BP: 114/71 mmHg (05/19 0505) Pulse Rate: 101  (05/19 0505)  Labs:  Basename 12/20/11 1210 12/20/11 0514 12/19/11 1947 12/19/11 0410 12/18/11 0430  HGB -- 9.1* -- 10.3* --  HCT -- 28.9* -- 31.9* 29.4*  PLT -- 491* -- 566* 438*  APTT -- -- -- -- --  LABPROT -- 14.5 -- -- --  INR -- 1.11 -- -- --  HEPARINUNFRC 0.33 0.35 0.85* -- --  CREATININE -- -- -- 0.94 0.85  CKTOTAL -- -- -- -- --  CKMB -- -- -- -- --  TROPONINI -- -- -- -- --    Estimated Creatinine Clearance: 77.3 ml/min (by C-G formula based on Cr of 0.94).   Medical History: Past Medical History  Diagnosis Date  . Hyperlipidemia   . Hypertension   . Hyperparathyroidism   . Obesity   . Hyperglycemia   . Hypercalcemia   . Asthma   . Shortness of breath     wtih exertion   . Chronic kidney disease     right non functioning kidney   . Arthritis     left knee  and left shoulder     Medications:  Scheduled:     . antiseptic oral rinse  15 mL Mouth Rinse BID  . coumadin book   Does not apply Once  . feeding supplement  30 mL Oral BID BM  . furosemide  80 mg Oral BID  . metoprolol tartrate  50 mg Oral BID  . pantoprazole  40 mg Oral Q1200  . potassium chloride  40 mEq Oral TID AC & HS  . warfarin  7.5 mg Oral ONCE-1800  . warfarin   Does not apply Once  . Warfarin - Pharmacist Dosing Inpatient   Does not apply q1800   Infusions:     . heparin 1,000 Units/hr (12/19/11 1023)    Assessment:  71 yo female  s/p small bowel resection and primary hernia repair on 5/3  found to have PE per CT scan on 5/17  Was started on full dose Lovenox per pharmacy 5/17. This was a conversion from SQ heparin 5000 units q8h, last dose given 0700 5/17.   Was given Lovenox 140 mg sq x 1 @ 1726 then changed to Heparin infusion per pharmacy in case future surgical interventions are necessary  HL within goal range on 750 units/hour  No bleeding reported. No problems with infusion or IV site reported  Coumadin started last night. 7.5mg  given. Today is Day 2 of minimum 5 day overlap (will also need 2 day overlap w/tx INR whichever is longer)   Goal of Therapy:  INR 2-3 Heparin level 0.3-0.7 units/ml Monitor platelets by anticoagulation protocol: Yes   Plan:   No change to Heparin rate, F/U am level. Continue for min of 5days of overlap w/Coumadin or 2 day overlap with Tx INR, whichever is longer  Coumadin 7.5mg  today  Daily INR  Coumadin counseling prior to d/c  Gwen Her PharmD  [phone] 12/20/2011 12:44 PM

## 2011-12-20 NOTE — Progress Notes (Signed)
ML abd dressing changed. All old packing removed. Repacked with NS moistened kerlix, topped with ABD pads. Small amt. Clear, yellow fluid ntd on old ABD pads, minimal odor.  abd opening raw, with sutures ntd deep in wound. Pt toll well. Med for discomfort afterward.

## 2011-12-21 DIAGNOSIS — K56609 Unspecified intestinal obstruction, unspecified as to partial versus complete obstruction: Secondary | ICD-10-CM

## 2011-12-21 DIAGNOSIS — I5021 Acute systolic (congestive) heart failure: Secondary | ICD-10-CM

## 2011-12-21 DIAGNOSIS — D7289 Other specified disorders of white blood cells: Secondary | ICD-10-CM

## 2011-12-21 DIAGNOSIS — I5031 Acute diastolic (congestive) heart failure: Secondary | ICD-10-CM

## 2011-12-21 LAB — BASIC METABOLIC PANEL
BUN: 15 mg/dL (ref 6–23)
CO2: 30 mEq/L (ref 19–32)
Calcium: 9.5 mg/dL (ref 8.4–10.5)
Chloride: 99 mEq/L (ref 96–112)
Creatinine, Ser: 0.93 mg/dL (ref 0.50–1.10)

## 2011-12-21 LAB — CBC
HCT: 30.7 % — ABNORMAL LOW (ref 36.0–46.0)
MCHC: 31.6 g/dL (ref 30.0–36.0)
MCV: 92.2 fL (ref 78.0–100.0)
Platelets: 527 10*3/uL — ABNORMAL HIGH (ref 150–400)
RDW: 14.7 % (ref 11.5–15.5)
WBC: 9.7 10*3/uL (ref 4.0–10.5)

## 2011-12-21 LAB — PROTIME-INR: Prothrombin Time: 16.2 seconds — ABNORMAL HIGH (ref 11.6–15.2)

## 2011-12-21 MED ORDER — WARFARIN SODIUM 10 MG PO TABS
10.0000 mg | ORAL_TABLET | Freq: Once | ORAL | Status: AC
  Administered 2011-12-21: 10 mg via ORAL
  Filled 2011-12-21: qty 1

## 2011-12-21 MED ORDER — HEPARIN BOLUS VIA INFUSION
1500.0000 [IU] | Freq: Once | INTRAVENOUS | Status: DC
  Filled 2011-12-21: qty 1500

## 2011-12-21 NOTE — Progress Notes (Signed)
Sitting up eating looks ok.  Continue wound care.  Cont heparin.

## 2011-12-21 NOTE — Progress Notes (Signed)
Midline abd dressing changed.  Pt pre-medicated before dsg change.  All old packing removed. Repacked with NS moistened Kerlix, and 4x4 gauze pads and ABD pads.  Small clear, yellow fluid noted on old Kerlix, abd opening raw, sutures deep in wound.  Pt tolerated well.

## 2011-12-21 NOTE — Progress Notes (Signed)
Occupational Therapy Treatment Patient Details Name: SALIHAH PECKHAM MRN: [id number] DOB: 05-06-1941 Today's Date: 12/21/2011 Time: 1308-6578 OT Time Calculation (min): 9 min  OT Assessment / Plan / Recommendation                             ADL  Lower Body Dressing: Performed;Min A;Other (comment) (Pt used sock aid and reacher.) Where Assessed - Lower Body Dressing: Unsupported sit to stand  ADL Comments: Session focused on AT education.  Practiced picking up items off the floor, as well as lower body dressing.  Pt states she feels the reach will help with her function and ability to get items off the floor when she drops them      OT Goals ADL Goals Pt Will Perform Lower Body Dressing: with modified independence;Sit to stand from chair ADL Goal: Lower Body Dressing - Progress: Other (comment) (goal updated) Miscellaneous OT Goals OT Goal: Miscellaneous Goal #1 - Progress: Progressing toward goals     Subjective Data  Subjective: It depends on the doctor as if i go home or SNF      Cognition  Overall Cognitive Status: Appears within functional limits for tasks assessed/performed Arousal/Alertness: Awake/alert Orientation Level: Appears intact for tasks assessed Behavior During Session: [facility] for tasks performed    Mobility Transfers Sit to Stand: 4: Min guard;With upper extremity assist;From chair/3-in-1 Stand to Sit: 4: Min guard;With upper extremity assist;To chair/3-in-1         End of Session OT - End of Session Activity Tolerance: Patient tolerated treatment well Patient left: in chair   Sumayyah Custodio, Metro Kung 12/21/2011, 11:18 AM

## 2011-12-21 NOTE — Progress Notes (Addendum)
ANTICOAGULATION CONSULT NOTE - Follow Up Consult  Pharmacy Consult for Heparin/Coumadin Indication: pulmonary embolus  Allergies  Allergen Reactions  . Hydralazine Anaphylaxis    Swelling tongue.  . Aspirin     REACTION: hives  . Crab (Shellfish Allergy) Hives  . Lisinopril     REACTION: angioedema    Patient Measurements: Height: 5\' 4"  (162.6 cm) Weight: 290 lb 8 oz (131.77 kg) IBW/kg (Calculated) : 54.7   Vital Signs: Temp: 98.5 F (36.9 C) (05/20 0508) Temp src: Oral (05/20 0508) BP: 124/80 mmHg (05/20 0508) Pulse Rate: 98  (05/20 0508)  Labs:  Basename 12/21/11 0500 12/20/11 1210 12/20/11 0514 12/19/11 0410  HGB 9.7* -- 9.1* --  HCT 30.7* -- 28.9* 31.9*  PLT 527* -- 491* 566*  APTT -- -- -- --  LABPROT 16.2* -- 14.5 --  INR 1.27 -- 1.11 --  HEPARINUNFRC 0.13* 0.33 0.35 --  CREATININE 0.93 -- -- 0.94  CKTOTAL -- -- -- --  CKMB -- -- -- --  TROPONINI -- -- -- --    Estimated Creatinine Clearance: 77.1 ml/min (by C-G formula based on Cr of 0.93).   Medical History: Past Medical History  Diagnosis Date  . Hyperlipidemia   . Hypertension   . Hyperparathyroidism   . Obesity   . Hyperglycemia   . Hypercalcemia   . Asthma   . Shortness of breath     wtih exertion   . Chronic kidney disease     right non functioning kidney   . Arthritis     left knee  and left shoulder     Medications:  Scheduled:     . antiseptic oral rinse  15 mL Mouth Rinse BID  . feeding supplement  30 mL Oral BID BM  . furosemide  80 mg Oral BID  . metoprolol tartrate  50 mg Oral BID  . pantoprazole  40 mg Oral Q1200  . potassium chloride  40 mEq Oral TID AC & HS  . warfarin  7.5 mg Oral ONCE-1800  . Warfarin - Pharmacist Dosing Inpatient   Does not apply q1800   Infusions:     . heparin 750 Units/hr (12/20/11 1900)    Assessment:  71 yo female s/p small bowel resection and primary hernia repair on 5/3  found to have PE per CT scan on 5/17  Was started on full  dose Lovenox per pharmacy 5/17. This was a conversion from SQ heparin 5000 units q8h, last dose given 0700 5/17.   Was given Lovenox 140 mg sq x 1 @ 1726 then changed to Heparin infusion per pharmacy in case future surgical interventions are necessary  HL subtherapeutic on 750 units/hr  No bleeding reported. No problems with infusion or IV site reported  INR subtherapeutic after coumadin 7.5mg  x 2 doses.  Today is Day 3 of minimum 5 day overlap (will also need 2 day overlap w/tx INR whichever is longer)   Goal of Therapy:  INR 2-3 Heparin level 0.3-0.7 units/ml Monitor platelets by anticoagulation protocol: Yes   Plan:   Increase heparin 950 units/hr, rcheck level in 6hrs  Continue for min of 5days of overlap w/Coumadin or 2 day overlap with Tx INR, whichever is longer  Increase coumadin 10mg  today  Daily INR  Educated patient regarding coumadin therapy: indication, adverse effects, importance of INR monitoring, food and drug interactions.  Loralee Pacas, PharmD, BCPS Pager: [phone] 12/21/2011 7:49 AM       Addendum:  Patient currently has heparin drip  running at 950 units/hr.  Heparin level at 1500 today low at 0.21 (goal 0.3-0.7).  No bleeding/complications per RN  Plan:  No bolus given INR rising on Coumadin. Increase heparin drip to 1100 units/hr.  Follow up with 6h heparin level at 2300 tonight.    Geoffry Paradise, PharmD.   Pager:  [phone] 3:57 PM

## 2011-12-21 NOTE — Progress Notes (Signed)
17 Days Post-Op  Subjective: Looks better, today.  Objective: Vital signs in last 24 hours: Temp:  [98.5 F (36.9 C)-99 F (37.2 C)] 98.5 F (36.9 C) (05/20 0508) Pulse Rate:  [79-100] 98  (05/20 0508) Resp:  [20] 20  (05/20 0508) BP: (124-129)/(73-81) 124/80 mmHg (05/20 0508) SpO2:  [93 %-98 %] 93 % (05/20 0508) Weight:  [131.77 kg (290 lb 8 oz)] 131.77 kg (290 lb 8 oz) (05/20 0508) Last BM Date: 12/20/11  5 stools yesterday, TM99, VSS, INR 1.12 othe labs OK  Intake/Output from previous day: 05/19 0701 - 05/20 0700 In: 840 [P.O.:720; I.V.:120] Out: 2125 [Urine:2125] Intake/Output this shift:    General appearance: alert, cooperative and no distress Resp: clear to auscultation bilaterally and few rales in bases GI: less tender, taking Po's well.  Loose stools better on Imodium I will check wound when she is in Bed.  Lab Results:   Basename 12/21/11 0500 12/20/11 0514  WBC 9.7 9.2  HGB 9.7* 9.1*  HCT 30.7* 28.9*  PLT 527* 491*    BMET  Basename 12/21/11 0500 12/19/11 0410  NA 138 137  K 3.7 3.9  CL 99 96  CO2 30 31  GLUCOSE 91 98  BUN 15 11  CREATININE 0.93 0.94  CALCIUM 9.5 9.9   PT/INR  Basename 12/21/11 0500 12/20/11 0514  LABPROT 16.2* 14.5  INR 1.27 1.11    No results found for this basename: AST:5,ALT:5,ALKPHOS:5,BILITOT:5,PROT:5,ALBUMIN:5 in the last 168 hours   Lipase  No results found for this basename: lipase     Studies/Results: No results found.  Medications:    . antiseptic oral rinse  15 mL Mouth Rinse BID  . feeding supplement  30 mL Oral BID BM  . furosemide  80 mg Oral BID  . metoprolol tartrate  50 mg Oral BID  . pantoprazole  40 mg Oral Q1200  . potassium chloride  40 mEq Oral TID AC & HS  . warfarin  10 mg Oral ONCE-1800  . warfarin  7.5 mg Oral ONCE-1800  . Warfarin - Pharmacist Dosing Inpatient   Does not apply q1800    Assessment/Plan Small bowel obstruction due to strangulated incisional hernia s/p expl lap,  small bowel resection(90 cm), primary hernia repair 12/04/11 (12/04/2011)  S/P NEPHRECTOMY Jan 2013 Respiratory failure following trauma and surgery /(12/05/2011) Tachycardia (12/05/2011) Asthma (12/04/2011) HYPERPARATHYROIDISM  HYPERLIPIDEMIA  BMI 55  Post op DCHF/ EF 50-55% on 2D Echo 3/22/123 Diarrhea/leukocytosis/ day 13 Invanz/ C Diff neg 12/11/11. Pulmonary embolism  Check wound later today when she's in bed.  Get therapeutic on coumadin and then tx to SNF for rehab.      LOS: 18 days    Tina Chase 12/21/2011

## 2011-12-21 NOTE — Progress Notes (Signed)
Physical Therapy Treatment Patient Details Name: Tina Chase MRN: [id number] DOB: 1941/06/10 Today's Date: 12/21/2011 Time: 4098-1191 PT Time Calculation (min): 13 min  PT Assessment / Plan / Recommendation Comments on Treatment Session  Pt deferred OOB due to fatigue and she states that she doesn't like to walk while on Morphine, however she did agree to do exercises in bed.  Pt did well with exercises.      Follow Up Recommendations  Home health PT;Supervision/Assistance - 24 hour    Barriers to Discharge        Equipment Recommendations  3 in 1 bedside comode    Recommendations for Other Services    Frequency Min 3X/week   Plan Discharge plan remains appropriate;Frequency remains appropriate    Precautions / Restrictions Precautions Precautions: Fall Precaution Comments: wound vac Restrictions Weight Bearing Restrictions: No   Pertinent Vitals/Pain No pain    Mobility  Transfers Sit to Stand: 4: Min guard;With upper extremity assist;From chair/3-in-1 Stand to Sit: 4: Min guard;With upper extremity assist;To chair/3-in-1    Exercises General Exercises - Lower Extremity Ankle Circles/Pumps: AROM;Both;20 reps Quad Sets: Strengthening;Both;10 reps;Supine Heel Slides: Strengthening;Both;10 reps;Supine Hip ABduction/ADduction: Strengthening;Both;10 reps;Supine   PT Diagnosis:    PT Problem List:   PT Treatment Interventions:     PT Goals Acute Rehab PT Goals PT Goal Formulation: With patient Time For Goal Achievement: 12/23/11 Potential to Achieve Goals: Good  Visit Information  Last PT Received On: 12/21/11 Assistance Needed: +1    Subjective Data  Subjective: I don't like to walk when I'm on Morphine.  Patient Stated Goal: to go home   Cognition  Overall Cognitive Status: Appears within functional limits for tasks assessed/performed Arousal/Alertness: Awake/alert Orientation Level: Appears intact for tasks assessed Behavior During Session: [facility] for  tasks performed Cognition - Other Comments: Pt pleasant and motivated    Balance     End of Session PT - End of Session Activity Tolerance: Patient tolerated treatment well Patient left: in bed;with call bell/phone within reach    Lessie Dings 12/21/2011, 12:58 PM

## 2011-12-21 NOTE — Progress Notes (Signed)
CSW continues to follow for d/c to SNF @ discharge. Patient has a bed at [facility] when ready.   Unice Bailey, LCSWA [phone]

## 2011-12-21 NOTE — Progress Notes (Addendum)
Patient ID: Tina Chase, female   DOB: 1940/12/30, 71 y.o.   MRN: [id number]  Subjective: No events overnight. Patient denies chest pain, shortness of breath, abdominal pain.   Objective:  Vital signs in last 24 hours:  Filed Vitals:   12/20/11 2233 12/20/11 2318 12/21/11 0508 12/21/11 1412  BP: 129/81  124/80 120/70  Pulse: 79 100 98 89  Temp: 98.6 F (37 C)  98.5 F (36.9 C) 97.7 F (36.5 C)  TempSrc: Oral  Oral Oral  Resp: 20  20 18   Height:      Weight:   131.77 kg (290 lb 8 oz)   SpO2: 98%  93% 93%    Intake/Output from previous day:  Intake/Output Summary (Last 24 hours) at 12/21/11 1552 Last data filed at 12/21/11 1421  Gross per 24 hour  Intake    600 ml  Output   2725 ml  Net  -2125 ml    Physical Exam: General: Alert, awake, oriented x3, in no acute distress. HEENT: No bruits, no goiter. Moist mucous membranes, no scleral icterus, no conjunctival pallor. Heart: Regular rate and rhythm, S1/S2 +, no murmurs, rubs, gallops. Lungs: Clear to auscultation bilaterally with slightly decreased breath sounds at bases. No wheezing, no rhonchi, no rales.  Abdomen: Soft, nontender, nondistended, positive bowel sounds. Extremities: No clubbing or cyanosis, +1 bilateral lower extremity pitting edema,  positive pedal pulses.  Neuro: Grossly nonfocal.  Lab Results:  Lab 12/21/11 0500 12/20/11 0514 12/19/11 0410 12/18/11 0430 12/17/11 0543  WBC 9.7 9.2 11.7* 12.8* 12.5*  HGB 9.7* 9.1* 10.3* 9.5* 9.4*  HCT 30.7* 28.9* 31.9* 29.4* 29.6*  PLT 527* 491* 566* 438* 442*    Lab 12/21/11 0500 12/19/11 0410 12/18/11 0430 12/17/11 0543 12/16/11 0416  NA 138 137 140 139 140  K 3.7 3.9 3.6 3.7 3.8  CL 99 96 99 99 101  CO2 30 31 31  32 31  GLUCOSE 91 98 101* 87 82  BUN 15 11 12 11 8   CREATININE 0.93 0.94 0.85 0.86 0.73  CALCIUM 9.5 9.9 9.4 9.4 9.1    Lab 12/21/11 0500 12/20/11 0514  INR 1.27 1.11  PROTIME -- --    Medications Scheduled Meds:   . antiseptic oral rinse   15 mL Mouth Rinse BID  . feeding supplement  30 mL Oral BID BM  . furosemide  80 mg Oral BID  . metoprolol tartrate  50 mg Oral BID  . pantoprazole  40 mg Oral Q1200  . potassium chloride  40 mEq Oral TID AC & HS  . warfarin  10 mg Oral ONCE-1800  . warfarin  7.5 mg Oral ONCE-1800  . Warfarin - Pharmacist Dosing Inpatient   Does not apply q1800   Continuous Infusions:   . heparin 950 Units/hr (12/21/11 1424)   PRN Meds:.HYDROcodone-acetaminophen, levalbuterol, loperamide, metoprolol, morphine injection, ondansetron (ZOFRAN) IV, ondansetron, oxyCODONE-acetaminophen, sodium chloride  Assessment/Plan:  Principal Problem:  *Small bowel obstruction due to strangulated incisional hernia s/p expl lap, small bowel resection, primary hernia repair 12/04/11  - surgery managing and will continue to follow up on recommendations   Active Problems:  PE  - noted on CT CHEST  - heparin in progress with transition to coumadin   HYPERLIPIDEMIA  - this remains stable   HYPERPARATHYROIDISM NOS  - stable   Asthma  - remains clinically stable  - pt maintaining oxygen saturation > 93% on RA   Respiratory failure following trauma and surgery  - clinically stable at this  point  - will continue to monitor vitals per floor protocol   Tachycardia  - continue Metoprolol to 50 mg BID  - this is now resolved - continue to monitor vitals per floor protocol   Acute on chronic diastolic CHF (congestive heart failure)  - Patient has a history of grade 1 diastolic dysfunction and likely with volume resuscitation  - We have started diuretics and following intake and output.  - Pt continues to diurese well and creatinine is stable and within normal limits  - BNP continues to trend down   Diarrhea  - Not C. difficile. Have started Imodium.  - this is now improving   Disposition  - Needs short-term skilled nursing. - pt likely close to d/c  - will plan on signing off today but please page [place] at  [phone] if you still want [place] to follow along  EDUCATION  - test results and diagnostic studies were discussed with patient  - patient verbalized the understanding  - questions were answered at the bedside and contact information was provided for additional questions or concerns    LOS: 18 days   MAGICK-Jaycelyn Orrison 12/21/2011, 3:52 PM  [facility] Pager: [phone]

## 2011-12-21 NOTE — Progress Notes (Signed)
Abdominal dressings changed per orders.  Will Marlyne Beards, PA at bedside and evaluated wound.  R abd wound base 100% red/granulating.  No active drainage noted, minimal brownish/SS drainage noted to canister.  L abd wound base mostly red with some tan/yellow fibrous material at bottom.  This material was partially removed by PA.  Tunneling noted from medial side of wound toward navel.  This area and the rest of wound was packed with moist saline gauze according to verbal instructions provided by PA and previous orders.  Pt tolerated well.  Ardyth Gal, RN 12/21/2011

## 2011-12-22 DIAGNOSIS — R197 Diarrhea, unspecified: Secondary | ICD-10-CM

## 2011-12-22 DIAGNOSIS — I10 Essential (primary) hypertension: Secondary | ICD-10-CM

## 2011-12-22 DIAGNOSIS — D7289 Other specified disorders of white blood cells: Secondary | ICD-10-CM

## 2011-12-22 DIAGNOSIS — S31109A Unspecified open wound of abdominal wall, unspecified quadrant without penetration into peritoneal cavity, initial encounter: Secondary | ICD-10-CM

## 2011-12-22 DIAGNOSIS — K56609 Unspecified intestinal obstruction, unspecified as to partial versus complete obstruction: Secondary | ICD-10-CM

## 2011-12-22 DIAGNOSIS — I5021 Acute systolic (congestive) heart failure: Secondary | ICD-10-CM

## 2011-12-22 DIAGNOSIS — D649 Anemia, unspecified: Secondary | ICD-10-CM

## 2011-12-22 DIAGNOSIS — I5031 Acute diastolic (congestive) heart failure: Secondary | ICD-10-CM

## 2011-12-22 LAB — CBC
Hemoglobin: 9.3 g/dL — ABNORMAL LOW (ref 12.0–15.0)
MCH: 29 pg (ref 26.0–34.0)
MCHC: 31.4 g/dL (ref 30.0–36.0)
MCV: 92.2 fL (ref 78.0–100.0)
RBC: 3.21 MIL/uL — ABNORMAL LOW (ref 3.87–5.11)

## 2011-12-22 LAB — BASIC METABOLIC PANEL
CO2: 26 mEq/L (ref 19–32)
Chloride: 100 mEq/L (ref 96–112)
GFR calc non Af Amer: 66 mL/min — ABNORMAL LOW (ref >90)
Glucose, Bld: 86 mg/dL (ref 70–99)
Potassium: 4.1 mEq/L (ref 3.5–5.1)
Sodium: 136 mEq/L (ref 135–145)

## 2011-12-22 MED ORDER — TAB-A-VITE/IRON PO TABS
1.0000 | ORAL_TABLET | Freq: Every day | ORAL | Status: DC
  Administered 2011-12-23: 1 via ORAL
  Filled 2011-12-22: qty 1

## 2011-12-22 MED ORDER — FUROSEMIDE 80 MG PO TABS
80.0000 mg | ORAL_TABLET | Freq: Every day | ORAL | Status: DC
  Administered 2011-12-23: 80 mg via ORAL
  Filled 2011-12-22 (×2): qty 1

## 2011-12-22 MED ORDER — WARFARIN SODIUM 10 MG PO TABS
10.0000 mg | ORAL_TABLET | Freq: Once | ORAL | Status: AC
  Administered 2011-12-22: 10 mg via ORAL
  Filled 2011-12-22: qty 1

## 2011-12-22 MED ORDER — FUROSEMIDE 40 MG PO TABS
40.0000 mg | ORAL_TABLET | Freq: Every day | ORAL | Status: DC
  Administered 2011-12-22: 40 mg via ORAL
  Filled 2011-12-22 (×2): qty 1

## 2011-12-22 MED ORDER — FERROUS SULFATE 325 (65 FE) MG PO TABS
325.0000 mg | ORAL_TABLET | Freq: Two times a day (BID) | ORAL | Status: DC
  Administered 2011-12-22 – 2011-12-23 (×2): 325 mg via ORAL
  Filled 2011-12-22 (×4): qty 1

## 2011-12-22 NOTE — Discharge Summary (Signed)
Physician Discharge Summary  Patient ID: Tina Chase MRN: [id number] DOB/AGE: Mar 09, 1941 71 y.o.  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  Principal Problem:  *Small bowel obstruction due to strangulated incisional hernia s/p expl lap, small bowel resection, primary hernia repair 12/04/11 Active Problems:  Respiratory failure following trauma and surgery  Tachycardia  Acute on chronic diastolic CHF (congestive heart failure)  Pulmonary embolism  Obesity, Class III, BMI 40-49.9 (morbid obesity)  Diarrhea following gastrointestinal surgery  Open abdominal wall wound  Hypertension  Anemia  HYPERPARATHYROIDISM NOS  HYPERLIPIDEMIA  Asthma   PROCDEURES: :Exploratory laparotomy, lysis of adhesions (2 hours), small bowel resection (90 cm), primary repair of ventral incisional hernia  12/04/2011  Dr. Huntley Dec Course: This is a 71 year old female who underwent a laparoscopic-assisted right nephrectomy in January of 2013. She was admitted yesterday with a small bowel obstruction secondary to an incarcerated incisional hernia. Urgent operation is recommended yesterday but she did not want to have it done at that time. She failed to improve and her white blood cell count was rising and urgent operation was recommended today and she agreed. The procedure, risks, and severity of the problem had been discussed with her preoperatively. She was taken to the OR 12/04/11 and required extensive surgery as noted above with a counter incision to help get her small bowel back into her abdominal cavity. She was seen by critical care and Medicine throughout her  hospital stay.  She was extubated the 1st day, she was kept on antibiotics for her sepsis.  She had problems post op with tachycardia, hypokalemia.  Open wound had a wound vac applied.  Her diet was slowly advanced and bowel function returned but she had ongoing diarrhea.  Her size was a major issure, with it being very hard for her to even get up in bed initially.  OT/PT were consulted and has helped with mobility. Dr.Woodruff followed her post op for her renal function s/p lap nephrectomy.  We had to d/c the wound vac from her main abdominal wound because of soupy, necrotic tissue at the base of her abdominal wound.  This is slowly improving with wet to dry dressings.  She has a wound vac on her counter incision which is healing well.  Mobility, some DOE, leukocytosis remained an issue, we stopped Invanz and because of ongoing elevated WBC we ordered a CT scan to look for a post op abscess. CT scan showed what led to a finding of PE.  She was placed on IV heparin and coumadin.Abdominal CT did show a fluid collection  above the uterus which at this point has not required further treatment. Her primary issues at this point is mobility, her open wound which is requiring BID wet to dry dressing changes.  Her counter incision has a wound vac in place.  Her DOE is improving and we are reducing her lasix.  She is on heparin and coumadin for her PE.  Once her INR is therapeutic we plan d/c to SNF.  She will require ongoing PT/OT.  Hopefully in the future her open abdominal wound can transition to Wound Vac system also. We are going to transition her to Lovenox for 24 hours  and transfer to SNF.  We have ordered lovenox for 24 hours at SNF, she obtained INR 2.5 today, and will stop IV heparin.  We have ask pharmacy to send some 1/2 strength Dakins solution to floor to pack wound wet to dry for 48 hours, and then back to normal saline for dressing changes.   Disposition: 01-Home or Self Care   Medication List  As of  12/23/2011 10:49 AM   STOP taking these medications         isosorbide mononitrate 120 MG 24 hr tablet      losartan 25 MG tablet      senna-docusate 8.6-50 MG per tablet         TAKE these medications         amLODipine 10 MG tablet   Commonly known as: NORVASC   Take 10 mg by mouth daily before lunch.      enoxaparin 150 MG/ML injection   Commonly known as: LOVENOX   Inject 0.57 mLs (85 mg total) into the skin every 12 (twelve) hours.      feeding supplement Liqd   Take 30 mLs by mouth 2 (two) times daily between meals.      ferrous sulfate 325 (65 FE) MG tablet   Take 1 tablet (325 mg total) by mouth 2 (two) times daily with a meal.      furosemide 40 MG tablet   Commonly known as: LASIX   Take 1 tablet (40 mg total) by mouth 2 (two) times daily.      loperamide 1 MG/5ML solution   Commonly known as: IMODIUM   Take 10 mLs (2 mg total) by mouth as needed for diarrhea or loose stools.      metoprolol 50 MG tablet   Commonly known as: LOPRESSOR   Take 1 tablet (50 mg total) by mouth 2 (two) times daily.      multivitamins with iron Tabs   Take 1 tablet by mouth daily.      oxyCODONE-acetaminophen 5-325 MG per tablet   Commonly known as: PERCOCET   Take 1-2 tablets by mouth every 4 (four) hours as needed.      potassium chloride SA 20 MEQ tablet   Commonly known as: K-DUR,KLOR-CON   Take 2 tablets (40 mEq total) by mouth 2 (two) times daily.      PROAIR HFA 108 (90 BASE) MCG/ACT inhaler   Generic drug: albuterol   USE 2 PUFFS EVERY 4 TO 6 HOURS AS NEEDEDFOR WHEEZING      sodium hypochlorite external solution   Commonly known as: DAKIN'S 1/2 STRENGTH   Use this for wet to dry dressings for 48 hours, then back to wet to dry dressings with normal saline.      warfarin 2.5 MG tablet   Commonly known as: COUMADIN   Take 1 tablet (2.5 mg total) by mouth one time only at 6 PM.           Follow-up Information    Follow up with ROSENBOWER,TODD J, MD. Schedule  an appointment as soon as possible for a visit in 2 weeks.   Contact information:   [facility] [address] [place] [address] [phone]       Schedule an appointment as soon as possible for a visit with Kathreen Cosier, MD. (Coordinate discharge from Nursing facility with follow up with  their office.  They need to follow your blood pressure, your breathing, and your  coumadin.)    Contact information:   [address] [place] [address] [place] [address] [phone]       Follow up with You will need your coumadin checked daily till they have you on a schedule for this..      Please follow up. (Wet to dry dressings to the open wound.  SNF can use 1/2 strength Dakins solution for 48 hours then go to  normal saline.)       Please follow up. (Negative pressure dressing to counter incision right abdomen. change  this 3 times per week.)          Signed: Park Beck 12/23/2011, 10:49 AM

## 2011-12-22 NOTE — Progress Notes (Signed)
Occupational Therapy Treatment Patient Details Name: Tina Chase MRN: [id number] DOB: June 17, 1941 Today's Date: 12/22/2011 Time: 4782-9562 OT Time Calculation (min): 25 min  OT Assessment / Plan / Recommendation Comments on Treatment Session Pt diong well and ready to get well and transition back home.                      Precautions / Restrictions Precautions Precautions: Fall Precaution Comments: wound vac       ADL  Grooming: Performed Where Assessed - Grooming: Other (comment) (standing at tray table) Toilet Transfer: Performed;Min guard;Other (comment) (min A due to lines) Toilet Transfer Method: Stand pivot Acupuncturist: Extra wide bedside Animator and Hygiene: Simulated;Minimal assistance Where Assessed - Engineer, mining and Hygiene: Standing Toileting - Clothing Manipulation: Performed;Minimal assistance Where Assessed - Glass blower/designer Manipulation: Standing Toileting - Hygiene: Simulated;Minimal assistance Where Assessed - Toileting Hygiene: Standing Tub/Shower Transfer Method: Not assessed Transfers/Ambulation Related to ADLs: Pt also practiced obtaining items off floor with reach from standing position      OT Goals ADL Goals ADL Goal: Grooming - Progress: Progressing toward goals Pt Will Transfer to Toilet: with supervision;Other (comment) (goal updated) ADL Goal: Toilet Transfer - Progress: Goal set today ADL Goal: Toileting - Clothing Manipulation - Progress: Progressing toward goals ADL Goal: Toileting - Hygiene - Progress: Progressing toward goals Miscellaneous OT Goals OT Goal: Miscellaneous Goal #1 - Progress: Progressing toward goals OT Goal: Miscellaneous Goal #2 - Progress: Progressing toward goals     Subjective Data  Subjective: I just want to get out of here- being here is affecting my head      Cognition  Overall Cognitive Status: Appears within functional limits for  tasks assessed/performed Arousal/Alertness: Awake/alert Orientation Level: Appears intact for tasks assessed Behavior During Session: [facility] for tasks performed Cognition - Other Comments: Pt pleasant and motivated    Mobility Transfers Transfers: Sit to Stand;Stand to Sit Sit to Stand: 5: Supervision;With upper extremity assist;From chair/3-in-1 Stand to Sit: 5: Supervision;To toilet;To chair/3-in-1         End of Session OT - End of Session Activity Tolerance: Patient tolerated treatment well Patient left: in chair;with call bell/phone within reach   [facility], Londell Noll D 12/22/2011, 10:00 AM

## 2011-12-22 NOTE — Progress Notes (Signed)
ANTICOAGULATION CONSULT NOTE - Follow Up Consult  Pharmacy Consult for Heparin/Coumadin Indication: pulmonary embolus  Allergies  Allergen Reactions  . Hydralazine Anaphylaxis    Swelling tongue.  . Aspirin     REACTION: hives  . Crab (Shellfish Allergy) Hives  . Lisinopril     REACTION: angioedema    Patient Measurements: Height: 5\' 4"  (162.6 cm) Weight: 291 lb 3.6 oz (132.1 kg) IBW/kg (Calculated) : 54.7   Vital Signs: Temp: 98.1 F (36.7 C) (05/21 0545) Temp src: Oral (05/21 0545) BP: 96/68 mmHg (05/21 0545) Pulse Rate: 107  (05/21 0545)  Labs:  Basename 12/22/11 0500 12/21/11 2310 12/21/11 1448 12/21/11 0500 12/20/11 0514  HGB 9.3* -- -- 9.7* --  HCT 29.6* -- -- 30.7* 28.9*  PLT 476* -- -- 527* 491*  APTT -- -- -- -- --  LABPROT 19.6* -- -- 16.2* 14.5  INR 1.63* -- -- 1.27 1.11  HEPARINUNFRC 0.33 0.34 0.21* -- --  CREATININE -- -- -- 0.93 --  CKTOTAL -- -- -- -- --  CKMB -- -- -- -- --  TROPONINI -- -- -- -- --    Estimated Creatinine Clearance: 77.2 ml/min (by C-G formula based on Cr of 0.93).   Medical History: Past Medical History  Diagnosis Date  . Hyperlipidemia   . Hypertension   . Hyperparathyroidism   . Obesity   . Hyperglycemia   . Hypercalcemia   . Asthma   . Shortness of breath     wtih exertion   . Chronic kidney disease     right non functioning kidney   . Arthritis     left knee  and left shoulder     Medications:  Scheduled:     . antiseptic oral rinse  15 mL Mouth Rinse BID  . feeding supplement  30 mL Oral BID BM  . furosemide  80 mg Oral BID  . metoprolol tartrate  50 mg Oral BID  . pantoprazole  40 mg Oral Q1200  . potassium chloride  40 mEq Oral TID AC & HS  . warfarin  10 mg Oral ONCE-1800  . Warfarin - Pharmacist Dosing Inpatient   Does not apply q1800  . DISCONTD: heparin  1,500 Units Intravenous Once   Infusions:     . heparin 1,100 Units/hr (12/21/11 1613)    Assessment:  71 yo female s/p small bowel  resection and primary hernia repair on 5/3  found to have PE per CT scan on 5/17  Was started on full dose Lovenox per pharmacy 5/17. This was a conversion from SQ heparin 5000 units q8h, last dose given 0700 5/17.   Was given Lovenox 140 mg sq x 1 @ 1726 then changed to Heparin infusion per pharmacy in case future surgical interventions are necessary  HL therapeutic x 2 on 1100 units/hr  No bleeding reported. No problems with infusion or IV site reported  INR subtherapeutic but responding after coumadin 7.5, 7.5, 10mg  doses.  Today is Day 4 of minimum 5 day overlap (will also need 2 day overlap w/tx INR whichever is longer)   Goal of Therapy:  INR 2-3 Heparin level 0.3-0.7 units/ml Monitor platelets by anticoagulation protocol: Yes   Plan:   Continue heparin at current rate 1100 units/hr  Continue for min of 5days of overlap w/Coumadin or 2 day overlap with Tx INR, whichever is longer  Repeat coumadin 10mg  today  Daily INR  Educated patient on 5/20 regarding coumadin therapy: indication, adverse effects, importance of INR monitoring,  food and drug interactions.  Loralee Pacas, PharmD, BCPS Pager: [phone] 12/22/2011 6:56 AM       Addendum:  Patient currently has heparin drip running at 950 units/hr.  Heparin level at 1500 today low at 0.21 (goal 0.3-0.7).  No bleeding/complications per RN  Plan:  No bolus given INR rising on Coumadin. Increase heparin drip to 1100 units/hr.  Follow up with 6h heparin level at 2300 tonight.    Geoffry Paradise, PharmD.   Pager:  [phone] 6:56 AM

## 2011-12-22 NOTE — Progress Notes (Signed)
PT Cancellation Note  Treatment cancelled today due to patient's refusal to participate.  Checked on pt this morning and she requested PT come back after having leaky sponge part of wound vac changed.  Checked on pt again approx 30 min after change and pt reports too much pain in abdomen, she does not wish to take pain meds, and would like PT to check back tomorrow.  Will attempt as schedule permits.  Nickol Collister,KATHrine E 12/22/2011, 3:30 PM Pager: [phone]

## 2011-12-22 NOTE — Progress Notes (Signed)
Nutrition Follow-up  Diet Order:  Regular diet   Patient reported good appetite. PO intake documented 100% of regular diet. Patient continues to take Prostat BID. Noted pitting edema of lower extremity  Meds: Scheduled Meds:   . antiseptic oral rinse  15 mL Mouth Rinse BID  . feeding supplement  30 mL Oral BID BM  . furosemide  80 mg Oral BID  . metoprolol tartrate  50 mg Oral BID  . pantoprazole  40 mg Oral Q1200  . potassium chloride  40 mEq Oral TID AC & HS  . warfarin  10 mg Oral ONCE-1800  . warfarin  10 mg Oral Once  . Warfarin - Pharmacist Dosing Inpatient   Does not apply q1800  . DISCONTD: heparin  1,500 Units Intravenous Once   Continuous Infusions:   . heparin 1,100 Units/hr (12/21/11 1613)   PRN Meds:.HYDROcodone-acetaminophen, levalbuterol, loperamide, metoprolol, morphine injection, ondansetron (ZOFRAN) IV, ondansetron, oxyCODONE-acetaminophen, sodium chloride  Labs:  CMP     Component Value Date/Time   NA 138 12/21/2011 0500   K 3.7 12/21/2011 0500   CL 99 12/21/2011 0500   CO2 30 12/21/2011 0500   GLUCOSE 91 12/21/2011 0500   BUN 15 12/21/2011 0500   CREATININE 0.93 12/21/2011 0500   CREATININE 0.78 09/23/2010 0925   CALCIUM 9.5 12/21/2011 0500   CALCIUM 10.5 06/10/2009 1342   PROT 5.8* 12/05/2011 0150   ALBUMIN 2.6* 12/05/2011 0150   AST 18 12/05/2011 0150   ALT 11 12/05/2011 0150   ALKPHOS 79 12/05/2011 0150   BILITOT 0.8 12/05/2011 0150   GFRNONAA 61* 12/21/2011 0500   GFRAA 71* 12/21/2011 0500     Intake/Output Summary (Last 24 hours) at 12/22/11 1109 Last data filed at 12/22/11 0600  Gross per 24 hour  Intake    372 ml  Output   1200 ml  Net   -828 ml   Wt Readings from Last 10 Encounters:  12/22/11 291 lb 3.6 oz (132.1 kg)  12/22/11 291 lb 3.6 oz (132.1 kg)  10/06/11 309 lb 11.2 oz (140.479 kg)  09/15/11 308 lb 14.4 oz (140.116 kg)  08/22/11 332 lb 0.2 oz (150.6 kg)  08/22/11 332 lb 0.2 oz (150.6 kg)  08/11/11 321 lb 11.2 oz (145.922 kg)  07/23/11 322 lb  12.8 oz (146.421 kg)  03/12/11 320 lb 8 oz (145.378 kg)  11/25/10 319 lb (144.697 kg)    Weight Status:  291 lb. *Weight down partially likely due to changes in fluid status.  Re-estimated needs remain the same:   Kcal: 1900-2000  Protein: 100-120 g  Fluid: >1.9L  Nutrition Dx:  Inadequate oral intake (NI-2.1). Status: Ongoing -Continue to monitor, improved.   Goal:  1. PO intake > 75% at meals. -Meeting, Continue.  2. Positive tolerance of Regular diet. -Meeting, Continue   Intervention:  1. Will continue to provide Prostat BID. 2. RD to follow for nutrition plan of care.   Monitor:  Weight trends, labs, diet tolerance, PO intake.    Iven Finn [facility] Pager #:  [phone]

## 2011-12-22 NOTE — Progress Notes (Signed)
Skilled nursing once INR therapuetic.

## 2011-12-22 NOTE — Progress Notes (Signed)
Patient ID: Tina Chase, female   DOB: 22-Feb-1941, 71 y.o.   MRN: [id number]  Subjective: No events overnight. Patient working with physical therapy, doing well no complaints  Objective:  Vital signs in last 24 hours:  Blood pressure 108/73, pulse 104, temperature 98.8 F (37.1 C), temperature source Oral, resp. rate 20, height 5\' 4"  (1.626 m), weight 132.1 kg (291 lb 3.6 oz), last menstrual period 09/23/1986, SpO2 96.00%.  Filed Vitals:   12/21/11 1412 12/21/11 2100 12/22/11 0545 12/22/11 1029  BP: 120/70 97/60 96/68  108/73  Pulse: 89 103 107 104  Temp: 97.7 F (36.5 C) 98 F (36.7 C) 98.1 F (36.7 C) 98.8 F (37.1 C)  TempSrc: Oral Oral Oral Oral  Resp: 18 20 18 20   Height:      Weight:   132.1 kg (291 lb 3.6 oz)   SpO2: 93% 96% 96% 96%    Intake/Output from previous day:  Intake/Output Summary (Last 24 hours) at 12/22/11 1147 Last data filed at 12/22/11 0600  Gross per 24 hour  Intake    372 ml  Output   1200 ml  Net   -828 ml    Physical Exam: General: Alert, awake, oriented x3, in no acute distress. HEENT: No bruits, no goiter. Moist mucous membranes, no scleral icterus, no conjunctival pallor. Heart: Regular rate and rhythm, S1/S2 +, no murmurs, rubs, gallops. Lungs: Clear to auscultation bilaterally with slightly decreased breath sounds at bases. No wheezing, no rhonchi, no rales.  Abdomen: Soft, nontender, nondistended, positive bowel sounds. Dressing intact with wound VAC Extremities: No clubbing or cyanosis, +1 bilateral lower extremity pitting edema,  positive pedal pulses.  Neuro: Grossly nonfocal.  Lab Results:  Lab 12/21/11 0500 12/20/11 0514 12/19/11 0410 12/18/11 0430 12/17/11 0543  WBC 9.7 9.2 11.7* 12.8* 12.5*  HGB 9.7* 9.1* 10.3* 9.5* 9.4*  HCT 30.7* 28.9* 31.9* 29.4* 29.6*  PLT 527* 491* 566* 438* 442*    Lab 12/21/11 0500 12/19/11 0410 12/18/11 0430 12/17/11 0543 12/16/11 0416  NA 138 137 140 139 140  K 3.7 3.9 3.6 3.7 3.8  CL 99 96 99  99 101  CO2 30 31 31  32 31  GLUCOSE 91 98 101* 87 82  BUN 15 11 12 11 8   CREATININE 0.93 0.94 0.85 0.86 0.73  CALCIUM 9.5 9.9 9.4 9.4 9.1    Lab 12/22/11 0500 12/21/11 0500 12/20/11 0514  INR 1.63* 1.27 1.11  PROTIME -- -- --    Medications Scheduled Meds:    . antiseptic oral rinse  15 mL Mouth Rinse BID  . feeding supplement  30 mL Oral BID BM  . furosemide  80 mg Oral BID  . metoprolol tartrate  50 mg Oral BID  . pantoprazole  40 mg Oral Q1200  . potassium chloride  40 mEq Oral TID AC & HS  . warfarin  10 mg Oral ONCE-1800  . warfarin  10 mg Oral Once  . Warfarin - Pharmacist Dosing Inpatient   Does not apply q1800  . DISCONTD: heparin  1,500 Units Intravenous Once   Continuous Infusions:    . heparin 1,100 Units/hr (12/21/11 1613)   PRN Meds:.HYDROcodone-acetaminophen, levalbuterol, loperamide, metoprolol, morphine injection, ondansetron (ZOFRAN) IV, ondansetron, oxyCODONE-acetaminophen, sodium chloride  Assessment/Plan:  Principal Problem:  *Small bowel obstruction due to strangulated incisional hernia s/p expl lap, small bowel resection, primary hernia repair 12/04/11  - Per primary service, surgery   Active Problems:  Pulmonary embolism   - noted on CT CHEST, heparin in  progress with transition to coumadin. INR trending up, 1.63 today,  continue Coumadin per pharmacy - Patient will be followed by SNF physician for PT/INR monitoring and Coumadin dosing after DC  HYPERLIPIDEMIA: Stable   HYPERPARATHYROIDISM NOS :stable   Asthma: Stable - pt maintaining oxygen saturation > 93% on RA   Respiratory failure following trauma and surgery  - clinically stable at this point   Tachycardia  - continue Metoprolol to 50 mg BID  - this is now resolved - continue to monitor vitals per floor protocol   Acute on chronic diastolic CHF (congestive heart failure): Improving  - Patient has a history of grade 1 diastolic dysfunction and likely with volume resuscitation  -  Continue diuretics, patient is on scheduled potassium replacement, I have ordered BMET for today   Diarrhea improving  - Not C. difficile. Continue Imodium.   Disposition  - Needs short-term skilled nursing. - pt likely close to d/c, medicine consultation to sign off today discussed with Will Marlyne Beards, surgery service. Please call again if you need any assistance.  Please note medications for disposition: 1) Coumadin per pharmacy, patient to have PT INR checked at SNF and Coumadin dose to be adjusted  2)  metoprolol 50 mg twice a day, Lasix 80mg  BID x this week, then taper to 40mg  BID. (creatinine function to be monitored at SNF)   3) protonix and albuterol inhaler    LOS: 19 days   [name] 12/22/2011, 11:47 AM  [facility] Pager: [phone]

## 2011-12-22 NOTE — Progress Notes (Signed)
18 Days Post-Op  Subjective: Up in chair, says it's more comfortable on her abdomen than the bed.  Also easier to get to bedside commode. Stool is softer and more formed now, with some immodium.  Objective: Vital signs in last 24 hours: Temp:  [97.7 F (36.5 C)-98.8 F (37.1 C)] 98.8 F (37.1 C) (05/21 1029) Pulse Rate:  [89-107] 104  (05/21 1029) Resp:  [18-20] 20  (05/21 1029) BP: (96-120)/(60-73) 108/73 mmHg (05/21 1029) SpO2:  [93 %-96 %] 96 % (05/21 1029) Weight:  [132.1 kg (291 lb 3.6 oz)] 132.1 kg (291 lb 3.6 oz) (05/21 0545) Last BM Date: 12/21/11  480 PO , 3 stools yesterday, Afebrile, ongoing tachycardia, BP is down some, on 80 lasix BID,  Intake/Output from previous day: 05/20 0701 - 05/21 0700 In: 612 [P.O.:480; I.V.:132] Out: 1500 [Urine:1500] Intake/Output this shift:    General appearance: alert, cooperative and no distress Resp: clear to auscultation bilaterally and some crackles at the bases. GI: Up in chair so I will look at dressing tomorrow, tolerating PO's well  Lab Results:   Basename 12/22/11 0500 12/21/11 0500  WBC 9.8 9.7  HGB 9.3* 9.7*  HCT 29.6* 30.7*  PLT 476* 527*    BMET  Basename 12/21/11 0500  NA 138  K 3.7  CL 99  CO2 30  GLUCOSE 91  BUN 15  CREATININE 0.93  CALCIUM 9.5   PT/INR  Basename 12/22/11 0500 12/21/11 0500  LABPROT 19.6* 16.2*  INR 1.63* 1.27    No results found for this basename: AST:5,ALT:5,ALKPHOS:5,BILITOT:5,PROT:5,ALBUMIN:5 in the last 168 hours   Lipase  No results found for this basename: lipase     Studies/Results: No results found.  Medications:    . antiseptic oral rinse  15 mL Mouth Rinse BID  . feeding supplement  30 mL Oral BID BM  . furosemide  80 mg Oral BID  . metoprolol tartrate  50 mg Oral BID  . pantoprazole  40 mg Oral Q1200  . potassium chloride  40 mEq Oral TID AC & HS  . warfarin  10 mg Oral ONCE-1800  . warfarin  10 mg Oral Once  . Warfarin - Pharmacist Dosing Inpatient    Does not apply q1800  . DISCONTD: heparin  1,500 Units Intravenous Once    Assessment/Plan Small bowel obstruction due to strangulated incisional hernia s/p expl lap, small bowel resection(90 cm), primary hernia repair 12/04/11 (12/04/2011)  S/P NEPHRECTOMY Jan 2013 Respiratory failure following trauma and surgery /(12/05/2011) Tachycardia (12/05/2011) Asthma (12/04/2011) HYPERPARATHYROIDISM  HYPERLIPIDEMIA  BMI 55  Post op DCHF/ EF 50-55% on 2D Echo 3/22/123 Diarrhea/leukocytosis/ day 13 Invanz/ C Diff neg 12/11/11. Pulmonary embolism Mild anemia  Plan:  I am going to decrease lasix now, and continue heparin.  She is followed by Dr. Berlinda Last, at the [facility].  i have told her to start calling as soon as she goes to rehab for follow up appointment.  Will look at wound tomorrow and SNF when she is therapeutic. Add FE to meds along with MVI.       LOS: 19 days    Mataya Kilduff 12/22/2011

## 2011-12-22 NOTE — Plan of Care (Signed)
Problem: Inadequate Intake (NI-2.1) Goal: Food and/or nutrient delivery Individualized approach for food/nutrient provision.  Outcome: Progressing PO intake 100% at meals.

## 2011-12-22 NOTE — Progress Notes (Signed)
ANTICOAGULATION CONSULT NOTE - Follow Up Consult  Pharmacy Consult for Heparin Indication: pulmonary embolus  Allergies  Allergen Reactions  . Hydralazine Anaphylaxis    Swelling tongue.  . Aspirin     REACTION: hives  . Crab (Shellfish Allergy) Hives  . Lisinopril     REACTION: angioedema    Patient Measurements: Height: 5\' 4"  (162.6 cm) Weight: 290 lb 8 oz (131.77 kg) IBW/kg (Calculated) : 54.7  Heparin Dosing Weight:   Vital Signs: Temp: 98 F (36.7 C) (05/20 2100) Temp src: Oral (05/20 2100) BP: 97/60 mmHg (05/20 2100) Pulse Rate: 103  (05/20 2100)  Labs:  Basename 12/21/11 2310 12/21/11 1448 12/21/11 0500 12/20/11 0514 12/19/11 0410  HGB -- -- 9.7* 9.1* --  HCT -- -- 30.7* 28.9* 31.9*  PLT -- -- 527* 491* 566*  APTT -- -- -- -- --  LABPROT -- -- 16.2* 14.5 --  INR -- -- 1.27 1.11 --  HEPARINUNFRC 0.34 0.21* 0.13* -- --  CREATININE -- -- 0.93 -- 0.94  CKTOTAL -- -- -- -- --  CKMB -- -- -- -- --  TROPONINI -- -- -- -- --    Estimated Creatinine Clearance: 77.1 ml/min (by C-G formula based on Cr of 0.93).   Medications:  Infusions:    . heparin 1,100 Units/hr (12/21/11 1613)    Assessment: Patient with heparin level at goal.  No issues per RN.  Goal of Therapy:  Heparin level 0.3-0.7 units/ml Monitor platelets by anticoagulation protocol: Yes   Plan:  Continue heparin at current rate, follow up with am labs  Aleene Davidson Crowford 12/22/2011,12:14 AM

## 2011-12-23 LAB — CBC
HCT: 27.9 % — ABNORMAL LOW (ref 36.0–46.0)
Hemoglobin: 8.9 g/dL — ABNORMAL LOW (ref 12.0–15.0)
MCHC: 31.9 g/dL (ref 30.0–36.0)
RBC: 3.02 MIL/uL — ABNORMAL LOW (ref 3.87–5.11)

## 2011-12-23 LAB — PROTIME-INR
INR: 2.56 — ABNORMAL HIGH (ref 0.00–1.49)
Prothrombin Time: 27.9 seconds — ABNORMAL HIGH (ref 11.6–15.2)

## 2011-12-23 MED ORDER — POTASSIUM CHLORIDE CRYS ER 20 MEQ PO TBCR: 40.0000 meq | EXTENDED_RELEASE_TABLET | Freq: Two times a day (BID) | ORAL | Status: DC

## 2011-12-23 MED ORDER — WARFARIN SODIUM 2.5 MG PO TABS: 2.5000 mg | ORAL_TABLET | Freq: Once | ORAL | Status: DC

## 2011-12-23 MED ORDER — LOPERAMIDE HCL 1 MG/5ML PO LIQD: 2.0000 mg | ORAL | Status: AC | PRN

## 2011-12-23 MED ORDER — TAB-A-VITE/IRON PO TABS: 1.0000 | ORAL_TABLET | Freq: Every day | ORAL | Status: DC

## 2011-12-23 MED ORDER — DAKINS (1/2 STRENGTH) 0.25 % EX SOLN: CUTANEOUS | Status: DC

## 2011-12-23 MED ORDER — ENOXAPARIN SODIUM 150 MG/ML ~~LOC~~ SOLN: 1.0000 mg/kg | Freq: Two times a day (BID) | SUBCUTANEOUS | Status: DC

## 2011-12-23 MED ORDER — WARFARIN SODIUM 2.5 MG PO TABS
2.5000 mg | ORAL_TABLET | Freq: Once | ORAL | Status: AC
  Administered 2011-12-23: 2.5 mg via ORAL
  Filled 2011-12-23: qty 1

## 2011-12-23 MED ORDER — OXYCODONE-ACETAMINOPHEN 5-325 MG PO TABS: 1.0000 | ORAL_TABLET | ORAL | Status: AC | PRN

## 2011-12-23 MED ORDER — FUROSEMIDE 40 MG PO TABS: 40.0000 mg | ORAL_TABLET | Freq: Two times a day (BID) | ORAL | Status: DC

## 2011-12-23 MED ORDER — ENOXAPARIN SODIUM 150 MG/ML ~~LOC~~ SOLN
130.0000 mg | Freq: Two times a day (BID) | SUBCUTANEOUS | Status: DC
  Administered 2011-12-23: 130 mg via SUBCUTANEOUS
  Filled 2011-12-23 (×2): qty 1

## 2011-12-23 MED ORDER — DAKINS (1/2 STRENGTH) 0.25 % EX SOLN
Freq: Two times a day (BID) | CUTANEOUS | Status: DC
  Filled 2011-12-23: qty 473

## 2011-12-23 MED ORDER — PRO-STAT SUGAR FREE PO LIQD: 30.0000 mL | Freq: Two times a day (BID) | ORAL | Status: DC

## 2011-12-23 MED ORDER — FERROUS SULFATE 325 (65 FE) MG PO TABS: 325.0000 mg | ORAL_TABLET | Freq: Two times a day (BID) | ORAL | Status: DC

## 2011-12-23 NOTE — Progress Notes (Signed)
Pharmacy:  Asked to recommend transition from IV heparin to SQ lovenox for discharge.  Stop heparin now, begin Lovenox 130mg  SQ q12h one hour after stopping heparin.  Would continue with coumadin 2.5mg  today as ordered (may give before discharge). Check INR at SNF tomorrow to determine further dosing.    Loralee Pacas, PharmD, BCPS 12/23/2011 11:23 AM

## 2011-12-23 NOTE — Discharge Instructions (Signed)
[facility]      [facility] [phone]  OPEN ABDOMINAL SURGERY: POST OP INSTRUCTIONS  Always review your discharge instruction sheet given to you by the facility where your surgery was performed.  IF YOU HAVE DISABILITY OR FAMILY LEAVE FORMS, YOU MUST BRING THEM TO THE OFFICE FOR PROCESSING.  PLEASE DO NOT GIVE THEM TO YOUR DOCTOR.  1. A prescription for pain medication may be given to you upon discharge.  Take your pain medication as prescribed, if needed.  If narcotic pain medicine is not needed, then you may take acetaminophen (Tylenol) or ibuprofen (Advil) as needed. 2. Take your usually prescribed medications unless otherwise directed. 3. If you need a refill on your pain medication, please contact your pharmacy. They will contact our office to request authorization.  Prescriptions will not be filled after 5pm or on week-ends. 4. You should follow a light diet the first few days after arrival home, such as soup and crackers, pudding, etc.unless your doctor has advised otherwise. A high-fiber, low fat diet can be resumed as tolerated.   Be sure to include lots of fluids daily. Most patients will experience some swelling and bruising on the chest and neck area.  Ice packs will help.  Swelling and bruising can take several days to resolve 5. Most patients will experience some swelling and bruising in the area of the incision. Ice pack will help. Swelling and bruising can take several days to resolve..  6. It is common to experience some constipation if taking pain medication after surgery.  Increasing fluid intake and taking a stool softener will usually help or prevent this problem from occurring.  A mild laxative (Milk of Magnesia or Miralax) should be taken according to package directions if there are no bowel movements after 48 hours. 7.  You may have steri-strips (small skin tapes) in place directly over the incision.  These strips should be left on the skin for 7-10 days.  If your  surgeon used skin glue on the incision, you may shower in 24 hours.  The glue will flake off over the next 2-3 weeks.  Any sutures or staples will be removed at the office during your follow-up visit. You may find that a light gauze bandage over your incision may keep your staples from being rubbed or pulled. You may shower and replace the bandage daily. 8. ACTIVITIES:  You may resume regular (light) daily activities beginning the next day--such as daily self-care, walking, climbing stairs--gradually increasing activities as tolerated.  You may have sexual intercourse when it is comfortable.  Refrain from any heavy lifting or straining until approved by your doctor. a. You may drive when you no longer are taking prescription pain medication, you can comfortably wear a seatbelt, and you can safely maneuver your car and apply brakes b. Return to Work: ___________________________________ 9. You should see your doctor in the office for a follow-up appointment approximately two weeks after your surgery.  Make sure that you call for this appointment within a day or two after you arrive home to insure a convenient appointment time. OTHER INSTRUCTIONS:  _____________________________________________________________ _____________________________________________________________  WHEN TO CALL YOUR DOCTOR: 1. Fever over 101.0 2. Inability to urinate 3. Nausea and/or vomiting 4. Extreme swelling or bruising 5. Continued bleeding from incision. 6. Increased pain, redness, or drainage from the incision. 7. Difficulty swallowing or breathing 8. Muscle cramping or spasms. 9. Numbness or tingling in hands or feet or around lips.  The clinic staff is available to   answer your questions during regular business hours.  Please don't hesitate to call and ask to speak to one of the nurses if you have concerns.  For further questions, please visit [email or web address]   

## 2011-12-23 NOTE — Progress Notes (Signed)
ANTICOAGULATION CONSULT NOTE - Follow Up Consult  Pharmacy Consult for Heparin/Coumadin Indication: pulmonary embolus  Allergies  Allergen Reactions  . Hydralazine Anaphylaxis    Swelling tongue.  . Aspirin     REACTION: hives  . Crab (Shellfish Allergy) Hives  . Lisinopril     REACTION: angioedema    Patient Measurements: Height: 5\' 4"  (162.6 cm) Weight: 290 lb 9.1 oz (131.8 kg) IBW/kg (Calculated) : 54.7   Vital Signs: Temp: 98 F (36.7 C) (05/22 0642) Temp src: Oral (05/22 0642) BP: 95/60 mmHg (05/22 0642) Pulse Rate: 95  (05/22 0642)  Labs:  Basename 12/23/11 0525 12/22/11 0500 12/21/11 2310 12/21/11 0500  HGB 8.9* 9.3* -- --  HCT 27.9* 29.6* -- 30.7*  PLT 499* 476* -- 527*  APTT -- -- -- --  LABPROT 27.9* 19.6* -- 16.2*  INR 2.56* 1.63* -- 1.27  HEPARINUNFRC 0.30 0.33 0.34 --  CREATININE -- 0.87 -- 0.93  CKTOTAL -- -- -- --  CKMB -- -- -- --  TROPONINI -- -- -- --    Estimated Creatinine Clearance: 82.4 ml/min (by C-G formula based on Cr of 0.87).   Medical History: Past Medical History  Diagnosis Date  . Hyperlipidemia   . Hypertension   . Hyperparathyroidism   . Obesity   . Hyperglycemia   . Hypercalcemia   . Asthma   . Shortness of breath     wtih exertion   . Chronic kidney disease     right non functioning kidney   . Arthritis     left knee  and left shoulder     Medications:  Scheduled:     . antiseptic oral rinse  15 mL Mouth Rinse BID  . feeding supplement  30 mL Oral BID BM  . ferrous sulfate  325 mg Oral BID WC  . furosemide  40 mg Oral q1800  . furosemide  80 mg Oral QAC breakfast  . metoprolol tartrate  50 mg Oral BID  . multivitamins with iron  1 tablet Oral Daily  . pantoprazole  40 mg Oral Q1200  . potassium chloride  40 mEq Oral TID AC & HS  . warfarin  10 mg Oral Once  . Warfarin - Pharmacist Dosing Inpatient   Does not apply q1800  . DISCONTD: furosemide  80 mg Oral BID   Infusions:     . heparin 1,100  Units/hr (12/22/11 1616)    Assessment:  71 yo female s/p small bowel resection and primary hernia repair on 5/3  found to have PE per CT scan on 5/17  Was started on full dose Lovenox per pharmacy 5/17. This was a conversion from SQ heparin 5000 units q8h, last dose given 0700 5/17.   Was given Lovenox 140 mg sq x 1 @ 1726 then changed to Heparin infusion per pharmacy in case future surgical interventions are necessary  HL therapeutic x 2 on 1100 units/hr  No bleeding reported. No problems with infusion or IV site reported  INR now therapeutic x 1 day and rising quickly after 7.5, 7.5, 10, 10mg  doses.  Today is Day 5 of overlap (still needs day overlap w/tx INR), therefore:  Continue heparin through today, or change to lovenox 130mg  sq q12h for one more day as INR will most likely remain therapeutic.   Goal of Therapy:  INR 2-3 Heparin level 0.3-0.7 units/ml Monitor platelets by anticoagulation protocol: Yes   Plan:   Continue heparin at current rate 1100 units/hr  Continue heparin (or lovenox  as above) to complete 2 day overlap with Tx INR  Decrease coumadin 2.5mg  today  Daily INR  Educated patient on 5/20 regarding coumadin therapy: indication, adverse effects, importance of INR monitoring, food and drug interactions.  Loralee Pacas, PharmD, BCPS Pager: [phone] 12/23/2011 7:50 AM       Addendum:  Patient currently has heparin drip running at 950 units/hr.  Heparin level at 1500 today low at 0.21 (goal 0.3-0.7).  No bleeding/complications per RN  Plan:  No bolus given INR rising on Coumadin. Increase heparin drip to 1100 units/hr.  Follow up with 6h heparin level at 2300 tonight.    Geoffry Paradise, PharmD.   Pager:  [phone] 7:50 AM

## 2011-12-23 NOTE — Progress Notes (Signed)
19 Days Post-Op  Subjective: Feeling better.  Objective: Vital signs in last 24 hours: Temp:  [97.7 F (36.5 C)-98.8 F (37.1 C)] 98 F (36.7 C) (05/22 1610) Pulse Rate:  [84-104] 95  (05/22 0642) Resp:  [18-20] 20  (05/22 0642) BP: (95-109)/(60-73) 95/60 mmHg (05/22 0642) SpO2:  [95 %-98 %] 95 % (05/22 0642) Weight:  [131.8 kg (290 lb 9.1 oz)] 131.8 kg (290 lb 9.1 oz) (05/22 0642) Last BM Date: 12/22/11  Intake/Output from previous day: 05/21 0701 - 05/22 0700 In: 951 [P.O.:720; I.V.:231] Out: 1300 [Urine:1300] Intake/Output this shift:    General appearance: alert, cooperative and no distress Resp: clear to auscultation bilaterally and decreased BS at the bases. GI: soft, open area still has some white necrotic fibrous tissue at the base, also smells a little like pseudomonas.  the side of the wound look very good and it's healing in nicely.  Lab Results:   Basename 12/23/11 0525 12/22/11 0500  WBC 8.7 9.8  HGB 8.9* 9.3*  HCT 27.9* 29.6*  PLT 499* 476*    BMET  Basename 12/22/11 0500 12/21/11 0500  NA 136 138  K 4.1 3.7  CL 100 99  CO2 26 30  GLUCOSE 86 91  BUN 15 15  CREATININE 0.87 0.93  CALCIUM 9.8 9.5   PT/INR  Basename 12/23/11 0525 12/22/11 0500  LABPROT 27.9* 19.6*  INR 2.56* 1.63*    No results found for this basename: AST:5,ALT:5,ALKPHOS:5,BILITOT:5,PROT:5,ALBUMIN:5 in the last 168 hours   Lipase  No results found for this basename: lipase     Studies/Results: No results found.  Medications:    . antiseptic oral rinse  15 mL Mouth Rinse BID  . feeding supplement  30 mL Oral BID BM  . ferrous sulfate  325 mg Oral BID WC  . furosemide  40 mg Oral q1800  . furosemide  80 mg Oral QAC breakfast  . metoprolol tartrate  50 mg Oral BID  . multivitamins with iron  1 tablet Oral Daily  . pantoprazole  40 mg Oral Q1200  . potassium chloride  40 mEq Oral TID AC & HS  . warfarin  10 mg Oral Once  . warfarin  2.5 mg Oral ONCE-1800  .  Warfarin - Pharmacist Dosing Inpatient   Does not apply q1800  . 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 Pulmonary embolism  Mild anemia   Plan:  I think she will be able to go to SNF as soon a pharmacy is OK with her coumadin status, we can transition to lovenox for 24 hours per pharmacist.  I am going to use some 1/2 strength Dakins solution for a couple days on the packing of the open wound.  We can go back to NS once she is OK to go to SNF.  Continue PT/OT.  Will plan D/C home today.       LOS: 20 days    Tina Chase 12/23/2011

## 2011-12-23 NOTE — Discharge Summary (Signed)
To SNIF.  Will follow up with primary care to follow INR

## 2011-12-23 NOTE — Progress Notes (Signed)
Spoke with pharmacist - per her progress note, pt could be discharged today to SNF on Lovenox.  Spoke with social work - as per her previous progress note, pt has a bed ready at SNF and could be discharged today.  Will Marlyne Beards, PA, notified.  Ardyth Gal, RN 12/23/2011

## 2011-12-23 NOTE — Progress Notes (Signed)
Pt ready for discharge to SNF.  Report called to floor nurse at [facility].  12:30pm Lovenox dose given as well as today's Coumadin dose.    Wound vac discontinued and wet to dry saline dressing placed to R abdominal wound for transport to SNF - wet to dry saline gauze dressing to be used at SNF until wound vac can be placed.  Left abdominal wound to be packed with wet to dry Dakin's solution Kerlix and changed twice daily.   Changed today by Zola Button, PA, at 0930.  Pt in stable condition and denies further needs or concerns at this time.  Will continue to monitor while awaiting transportation.  Ardyth Gal, RN 12/23/2011

## 2011-12-23 NOTE — Progress Notes (Signed)
Agree.  Ready for placement.

## 2011-12-23 NOTE — Progress Notes (Signed)
Patient set to discharge to [facility] today. Patient aware. [facility] called to schedule 3:00p pick-up.   Clinical Social Work Department CLINICAL SOCIAL WORK PLACEMENT NOTE 12/23/2011  Patient:  Tina Chase, Tina Chase  Account Number:  [id number] Admit date:  12/03/2011  Clinical Social Worker:  Orpah Greek  Date/time:  12/14/2011 02:26 PM  Clinical Social Work is seeking post-discharge placement for this patient at the following level of care:   SKILLED NURSING   (*CSW will update this form in Epic as items are completed)   12/14/2011  Patient/family provided with [facility] Department of Clinical Social Work's list of facilities offering this level of care within the geographic area requested by the patient (or if unable, by the patient's family).  12/14/2011  Patient/family informed of their freedom to choose among providers that offer the needed level of care, that participate in Medicare, Medicaid or managed care program needed by the patient, have an available bed and are willing to accept the patient.  12/14/2011  Patient/family informed of [facility]' ownership interest in [facility], as well as of the fact that they are under no obligation to receive care at this facility.  PASARR submitted to EDS on 12/14/2011 PASARR number received from EDS on 12/15/2011  FL2 transmitted to all facilities in geographic area requested by pt/family on  12/14/2011 FL2 transmitted to all facilities within larger geographic area on   Patient informed that his/her managed care company has contracts with or will negotiate with  certain facilities, including the following:     Patient/family informed of bed offers received:  12/16/2011 Patient chooses bed at [facility] Physician recommends and patient chooses bed at    Patient to be transferred to [facility] on  12/23/2011 Patient to be transferred to facility by [facility]  The following  physician request were entered in Epic:   Additional Comments:    Unice Bailey, LCSWA [phone]

## 2012-01-06 ENCOUNTER — Ambulatory Visit (INDEPENDENT_AMBULATORY_CARE_PROVIDER_SITE_OTHER): Payer: PRIVATE HEALTH INSURANCE | Admitting: General Surgery

## 2012-01-06 ENCOUNTER — Encounter (INDEPENDENT_AMBULATORY_CARE_PROVIDER_SITE_OTHER): Payer: Self-pay | Admitting: General Surgery

## 2012-01-06 ENCOUNTER — Encounter: Payer: PRIVATE HEALTH INSURANCE | Admitting: Internal Medicine

## 2012-01-06 VITALS — BP 128/82 | HR 71 | Temp 97.2°F | Ht 64.0 in | Wt 285.8 lb

## 2012-01-06 DIAGNOSIS — Z9889 Other specified postprocedural states: Secondary | ICD-10-CM

## 2012-01-06 NOTE — Progress Notes (Signed)
Operation: Emergency exploratory laparotomy, small bowel resection, repair of strangulated ventral hernia  Date: Dec 04, 2011    HPI:  She is here for her first postoperative visit.  She had a very complicated postoperative course. She has 2 wounds healing in by secondary intention. She was just discharged from rehabilitation today.   Physical Exam: General-morbidly obese female in no acute distress.  Abdomen-right lower quadrant wound is almost completely healed; midline wound is open with some fibrinous tissue attached to the sutures and granulation tissue surrounding it. Both wounds were dressed with normal saline damp to dry dressings.  Assessment:  Making slow but steady postoperative progress.  Plan: Strict low-fat diet. Continue to lose weight. Continue current dressing changes. Return visit in 3-4 weeks.

## 2012-01-06 NOTE — Patient Instructions (Signed)
Continue twice daily dressing changes.

## 2012-01-10 ENCOUNTER — Emergency Department (HOSPITAL_COMMUNITY)
Admission: EM | Admit: 2012-01-10 | Discharge: 2012-01-11 | Disposition: A | Discharge status: Home / Self Care | Payer: PRIVATE HEALTH INSURANCE | Attending: Emergency Medicine | Admitting: Emergency Medicine

## 2012-01-10 DIAGNOSIS — I129 Hypertensive chronic kidney disease with stage 1 through stage 4 chronic kidney disease, or unspecified chronic kidney disease: Secondary | ICD-10-CM | POA: Insufficient documentation

## 2012-01-10 DIAGNOSIS — E785 Hyperlipidemia, unspecified: Secondary | ICD-10-CM | POA: Insufficient documentation

## 2012-01-10 DIAGNOSIS — N189 Chronic kidney disease, unspecified: Secondary | ICD-10-CM | POA: Insufficient documentation

## 2012-01-10 DIAGNOSIS — E213 Hyperparathyroidism, unspecified: Secondary | ICD-10-CM | POA: Insufficient documentation

## 2012-01-10 DIAGNOSIS — J45909 Unspecified asthma, uncomplicated: Secondary | ICD-10-CM | POA: Insufficient documentation

## 2012-01-10 DIAGNOSIS — Z87891 Personal history of nicotine dependence: Secondary | ICD-10-CM | POA: Insufficient documentation

## 2012-01-10 DIAGNOSIS — Z0389 Encounter for observation for other suspected diseases and conditions ruled out: Secondary | ICD-10-CM | POA: Insufficient documentation

## 2012-01-10 DIAGNOSIS — Z9889 Other specified postprocedural states: Secondary | ICD-10-CM | POA: Insufficient documentation

## 2012-01-10 DIAGNOSIS — E669 Obesity, unspecified: Secondary | ICD-10-CM | POA: Insufficient documentation

## 2012-01-10 DIAGNOSIS — T819XXA Unspecified complication of procedure, initial encounter: Secondary | ICD-10-CM

## 2012-01-10 DIAGNOSIS — M19019 Primary osteoarthritis, unspecified shoulder: Secondary | ICD-10-CM | POA: Insufficient documentation

## 2012-01-10 DIAGNOSIS — Z79899 Other long term (current) drug therapy: Secondary | ICD-10-CM | POA: Insufficient documentation

## 2012-01-10 DIAGNOSIS — M1710 Unilateral primary osteoarthritis, unspecified knee: Secondary | ICD-10-CM | POA: Insufficient documentation

## 2012-01-10 NOTE — ED Notes (Signed)
As per EMS, pt noticed sutures in incision form abd hernia repair on May 3rd. No pain. No LOC. Pt walked up to EMS truck and requested to go to the hospital for Eval.

## 2012-01-10 NOTE — ED Notes (Signed)
[id number]<YN> Expected date:<BR> Expected time:<BR> Means of arrival:<BR> Comments:<BR> M50 - 71yoF Abd issue after hernia surgery *TRIAGE-CAPABLE*

## 2012-01-11 NOTE — ED Provider Notes (Signed)
Medical screening examination/treatment/procedure(s) were conducted as a shared visit with non-physician practitioner(s) and myself.  I personally evaluated the patient during the encounter.  Pt with surgical abdominal wound that is healing by secondary intention.  Pt noted suture material in wound today, seen by her surgeon this past week, suture material noted in his note at that time.  Pt with both blue and clear suture material in wound, tangled with fibrous material.  May need trimming of this suture, but will defer to her surgeon.  Pt to contact their office in the morning for close follow up.  No acute infection noted to wound at this time.  Olivia Mackie, MD 01/11/12 0600

## 2012-01-11 NOTE — ED Provider Notes (Signed)
History     CSN: [id number]  Arrival date & time 01/10/12  2330   First MD Initiated Contact with Patient 01/11/12 0008      Chief Complaint  Patient presents with  . Post-op Problem    HPI  History provided by the patient. History provided by the patient. Patient is a 71 year old female with history of hypertension, hyperlipidemia chronic kidney disease and obesity presents with concerns for complication of recent hernia repair surgery. Patient had abdominal hernia repair surgery by Dr. Kae Heller resulting in an open abdominal wound. Patient states that she has been following up regularly in the office. Patient also doesn't daily packing and dressing change twice a day. Today patient began to notice small suture material at inside of her abdominal wound. Patient states she has not noticed this before and was concerned that this was new or not in the right place. She denies having any pain or bleeding from the area. Symptoms are described as mild. Patient has no other concerns. Patient denies any other associated symptoms. She denies any fever, chills, sweats, abdominal pain, nausea or vomiting.    Past Medical History  Diagnosis Date  . Hyperlipidemia   . Hypertension   . Hyperparathyroidism   . Obesity   . Hyperglycemia   . Hypercalcemia   . Asthma   . Shortness of breath     wtih exertion   . Chronic kidney disease     right non functioning kidney   . Arthritis     left knee  and left shoulder     Past Surgical History  Procedure Date  . Tubal ligation   . Other surgical history     D and C x 3   . Laparoscopic nephrectomy 08/19/2011    Procedure: LAPAROSCOPIC NEPHRECTOMY;  Surgeon: Milford Cage, MD;  Location: [facility];  Service: Urology;  Laterality: Right;  . Laparotomy 12/04/2011    Procedure: EXPLORATORY LAPAROTOMY;  Surgeon: Adolph Pollack, MD;  Location: [facility];  Service: General;  Laterality: N/A;   bowel resection   . Incisional hernia repair 12/04/2011      Procedure: HERNIA REPAIR INCISIONAL;  Surgeon: Adolph Pollack, MD;  Location: [facility];  Service: General;  Laterality: N/A;  . Application of wound vac 12/04/2011    Procedure: APPLICATION OF WOUND VAC;  Surgeon: Adolph Pollack, MD;  Location: [facility];  Service: General;;  x two  . Kidney cyst removal     Family History  Problem Relation Age of Onset  . Stroke Neg Hx   . Cancer Neg Hx   . Nephrolithiasis Neg Hx   . Hyperlipidemia Mother   . Hypertension Mother   . Heart disease Father     had a heart attack at age 33  . Heart attack Father 24  . Heart disease Brother     at age 73  . Heart attack Brother 50    History  Substance Use Topics  . Smoking status: Former Smoker    Types: Cigarettes    Quit date: 08/03/1968  . Smokeless tobacco: Never Used  . Alcohol Use: No    OB History    Grav Para Term Preterm Abortions TAB SAB Ect Mult Living                  Review of Systems  Constitutional: Negative for fever, chills and appetite change.  Gastrointestinal: Negative for nausea, vomiting and abdominal pain.  Skin:  Denies bleeding    Allergies  Hydralazine; Aspirin; Crab; and Lisinopril  Home Medications   Current Outpatient Rx  Name Route Sig Dispense Refill  . AMLODIPINE BESYLATE 10 MG PO TABS Oral Take 10 mg by mouth daily before lunch.     . FERROUS FUMARATE 325 (106 FE) MG PO TABS Oral Take 1 tablet by mouth daily.    Marland Kitchen FERROUS SULFATE 325 (65 FE) MG PO TABS Oral Take 1 tablet (325 mg total) by mouth 2 (two) times daily with a meal. 60 tablet 0  . FUROSEMIDE 40 MG PO TABS Oral Take 1 tablet (40 mg total) by mouth 2 (two) times daily. 60 tablet 0  . METOPROLOL TARTRATE 50 MG PO TABS Oral Take 1 tablet (50 mg total) by mouth 2 (two) times daily. 60 tablet 11  . TAB-A-VITE/IRON PO TABS Oral Take 1 tablet by mouth daily.    Marland Kitchen POTASSIUM CHLORIDE CRYS ER 20 MEQ PO TBCR Oral Take 2 tablets (40 mEq total) by mouth 2 (two) times daily. 60 tablet 0  .  VITAMIN C 500 MG PO TABS Oral Take 500 mg by mouth daily.    . WARFARIN SODIUM 2.5 MG PO TABS Oral Take 1 tablet (2.5 mg total) by mouth one time only at 6 PM. 30 tablet 0  . ZINC SULFATE ER 220 (50 ZN) MG PO TBCR Oral Take 220 mg by mouth daily.    Marland Kitchen ENOXAPARIN SODIUM 150 MG/ML [place] SOLN Subcutaneous Inject 0.57 mLs (85 mg total) into the skin every 12 (twelve) hours. 2 Syringe 0  . PROAIR HFA 108 (90 BASE) MCG/ACT IN AERS  USE 2 PUFFS EVERY 4 TO 6 HOURS AS NEEDEDFOR WHEEZING 1 Inhaler 10    PLEASE RESPOND THANKS!    BP 124/63  Pulse 92  Resp 18  SpO2 99%  LMP 09/23/1986  Physical Exam  Nursing note and vitals reviewed. Constitutional: She is oriented to person, place, and time. She appears well-developed and well-nourished. No distress.  HENT:  Head: Normocephalic.  Cardiovascular: Normal rate and regular rhythm.   Pulmonary/Chest: Effort normal and breath sounds normal.  Abdominal: Soft. There is no tenderness.       There is an open midline abdominal wound. No active bleeding or drainage. There is suture material present but intact deep in the wound. There is not appear to be any evidence of torn sutures or material. Wound has good granulation and appears to be healing as expected. No significant tenderness or induration.  Neurological: She is alert and oriented to person, place, and time.  Skin: Skin is warm and dry.  Psychiatric: She has a normal mood and affect. Her behavior is normal.    ED Course  Procedures      1. Post-operative complication       MDM  Patient seen and evaluated. Patient in no acute distress.  Patient also seen and discussed with attending physician. The to be any concerning features or emergent condition to the wound at this time. Most recent office visit note from Dr. Abbey Chatters does mention sutures within wound, it is unclear what the purposeful or plan is for these. At this time patient to able to return home and continue wound care with close  followup for continued evaluation and treatment. Patient advised to speak with home health nurse to have more regular and daily visits.      Angus Seller, [place] 01/11/12 [phone]

## 2012-01-11 NOTE — Discharge Instructions (Signed)
You were seen and evaluated for your concerns of your abdominal wound and recent surgery. At this time your providers do not feel any signs for concerning or emergent cause of your symptoms do feel you need to continue to followup with your surgery specialist for evaluation of your wound and sutures. Please call Dr. Maris Berger office tomorrow to schedule a close followup appointment. Please also call your home health agency to be sure they are coming to assist you with change in your dressing and bandage daily. Return to the emergency room if you develop any significant pain, fever, chills, sweats, bleeding or drainage.    RESOURCE GUIDE  Chronic Pain Problems: Contact [facility]  [phone] Patients need to be referred by their primary care doctor.  Insufficient Money for Medicine: Contact United Way:  call "211" or [facility] [phone].  No Primary Care Doctor: - Call [facility]  [phone] - can help you locate a primary care doctor that  accepts your insurance, provides certain services, etc. - Physician Referral Service- [phone]  Agencies that provide inexpensive medical care: - [facility]  [phone] - [facility]  [phone] - [facility]  [phone] - Women's Clinic  [phone] - [facility]  [phone] [facility]  [phone]  Medicaid-accepting [facility] Providers: - [facility]- [address]  [phone], Mon-Fri 9am-7pm, Sat 9am-1pm - [facility]- [address] [place], [address] [place]  [phone] - [facility]- [address], [address] [place]  [phone] [facility]- [address]  [phone] - Renaye Rakers- [address] [place], [address], [phone]  Only accepts [place] patients after they have their name  applied to their card  Self Pay (no insurance) in [place]: - Sickle Cell Patients: Dr Willey Blade, [facility]  [address] [place], [phone] - [facility]- [address] [place]  [phone]       [facility] [place]- [address]       -     [facility]- see information above (Speak to [employer] if you do not have insurance)       -  [facility]- [address] [place], [phone]       -  [facility]- [address],  [phone]       -  [facility]- [address] [phone]       -  Dr Julio Sicks-  [address], [place], [phone]       -  [facility]- [address], [phone]       -  [facility]- [address] [phone], also [address], [phone]       -    [facility]- [address] [place], [phone], 1st & 3rd Saturday   every month, 10am-1pm  1) Find a Doctor and Pay Out of Pocket Although you won't have to find out who is covered by your insurance plan, it is a good idea to ask around and get recommendations. You will then need to call the office and see if the doctor you have chosen will accept you as a new patient and what types of options they offer for patients  who are self-pay. Some doctors offer discounts or will set up payment plans for their patients who do not have insurance, but you will need to ask so you aren't surprised when you get to your appointment.  2) Contact Your Local Health Department Not all health departments have doctors that can see patients for sick visits, but many do, so it is worth a call to see if yours does. If you don't know where your local health department is, you can check in your phone book. The CDC also has a tool to help you locate your state's health department, and many state websites also have listings of all of their local health departments.  3) Find a Walk-in Clinic If your illness is not likely to be very severe or complicated, you  may want to try a walk in clinic. These are popping up all over the country in pharmacies, drugstores, and shopping centers. They're usually staffed by nurse practitioners or physician assistants that have been trained to treat common illnesses and complaints. They're usually fairly quick and inexpensive. However, if you have serious medical issues or chronic medical problems, these are probably not your best option  STD Testing - [facility] Department of [facility] [place], STD Clinic, [address] [place], phone [phone] or [phone].  Monday - Friday, call for an appointment. [facility] Department of [employer], STD Clinic, [address], [place], phone [phone] or [phone].  Monday - Friday, call for an appointment.  Abuse/Neglect: [facility] Child Abuse Hotline 510-221-1376 [facility] Child Abuse Hotline 228-718-7866 (After Hours)  Emergency Shelter:  [facility] (816)122-8211  Maternity Homes: - [facility] at [facility] [place] [facility] 725-442-4629 - [facility] [phone]  MRSA Hotline #:   (321)861-4346  [facility] Resources  [facility] [place]  [employer] [facility] Dept. [address]                 [address]         [address] [place] [address]  Blondell Reveal Phone:  [phone]                                  Phone:  [phone]                   Phone:  [phone]  [facility], [phone] - [phone]       -     [facility] in [place], [address]                                  [phone], Insurance  [place] Child Abuse Hotline 6415956279 or (417)653-7519 (After Hours)   Behavioral Health Services  Substance Abuse  Resources: -  Alcohol and Drug Services  212-204-8624 - [facility] 626-778-5647 - [facility] [place] 619-628-3576 Floydene Flock ([phone] - Residential & Outpatient Substance Abuse Program  [phone]  Psychological Services: [facility]  ([phone] Services  [phone] - [facility], [phone] [place]. [address], [place], ACCESS LINE: (573)527-2786 or 8045423140, [employer]  Dental Assistance  If unable to pay or uninsured, contact:  [facility] or [facility]. to become qualified for the adult dental clinic.  Patients with Medicaid: [facility] [phone] [address], [phone] [address], [phone]  If unable to pay, or uninsured, contact [facility] [phone]) or [facility] [phone] in [place], [phone] in [facility]) to become qualified for the adult dental clinic  Other Low-Cost Community Dental Services: - [facility]- [address] [place], [place], [place], [address], [phone], [address], 2nd and 4th Thursday of the month at 6:30am.  10 clients each day by appointment, can sometimes see walk-in patients if someone does not show for an appointment. [facility]- [address] [place], [place], [address], [phone] - [facility]- [address] [place], [place], [address], [phone] - [facility]- [phone] [facility] Health Department- [phone] [facility] Department- [phone]

## 2012-01-13 ENCOUNTER — Ambulatory Visit (INDEPENDENT_AMBULATORY_CARE_PROVIDER_SITE_OTHER): Payer: PRIVATE HEALTH INSURANCE | Admitting: Internal Medicine

## 2012-01-13 ENCOUNTER — Encounter: Payer: Self-pay | Admitting: Internal Medicine

## 2012-01-13 VITALS — BP 153/95 | HR 112 | Temp 97.1°F | Ht 65.0 in | Wt 286.7 lb

## 2012-01-13 DIAGNOSIS — I2699 Other pulmonary embolism without acute cor pulmonale: Secondary | ICD-10-CM

## 2012-01-13 DIAGNOSIS — Z7901 Long term (current) use of anticoagulants: Secondary | ICD-10-CM

## 2012-01-13 DIAGNOSIS — X58XXXA Exposure to other specified factors, initial encounter: Secondary | ICD-10-CM

## 2012-01-13 DIAGNOSIS — S31109A Unspecified open wound of abdominal wall, unspecified quadrant without penetration into peritoneal cavity, initial encounter: Secondary | ICD-10-CM

## 2012-01-13 LAB — POCT INR: INR: 2

## 2012-01-13 NOTE — Assessment & Plan Note (Signed)
Patient was seen on 6/4 in Gen. surgery clinic, and again on 6/9 in the emergency room for evaluation of her wound. Today on exam there is markedly odor and green exudate, concerning for deep wound infection. The other possibility is that she has not been packing her wound deeply enough, resulting in a local pocket of infection. There does appear to be some suture material coated in green fibrinous exudate, raising the possibility of a foreign object infection/reaction. There is no evidence of overlying cellulitis. The patient has stable vitals and does not appear systemically toxic. She has been recently evaluated in surgery clinic, I would feel more comfortable if she could be evaluated again. - Gen. surgery appointment tomorrow at 3:15 with Dr. Michaell Cowing - Will hold off on antibiotics for now and defer to Gen. surgery judgment - Wound repacked today in clinic

## 2012-01-13 NOTE — Assessment & Plan Note (Signed)
Patient did suffer a pulmonary embolism during her admission, presumably provoked by surgery. Her INR had been checked during her stay at a SNF. She has been taking 2.5 mg daily of Coumadin, and her last INR was 2.5. Point-of-care INR today is 2.0. After discussion with Dr. Alexandria Lodge, plan is to take 5 mg over the next 3 days, and 2.5 on the weekend with followup in clinic on Monday. - 5 mg Coumadin today, Thursday, Friday - 2.5 mg Coumadin Saturday, Sunday - Followup in Coumadin clinic on Monday

## 2012-01-13 NOTE — Progress Notes (Signed)
Subjective:     Patient ID: Tina Chase, female   DOB: 01/19/1941, 71 y.o.   MRN: [id number]  HPI Patient is a 71 year old woman with hypertension, hyperlipidemia, and recent auscultation for emergent surgery of an incarcerated ventral hernia. She had a long postoperative course that included pulmonary embolus and delayed wound closure. She presents with concerns about her abdominal wound.  Patient was seen for followup in general surgery clinic on 01/06/12, and was again seen in the emergency room on 01/10/12. Both visits documented her open ventral abdominal wound healing by secondary intention, and suture material was noted overlying abdominal wall. The patient is complaining of greenish discharge despite twice daily dressing changes. She also complains of odor coming from the wound. She denies fevers or chills, or feeling poorly overall.  Review of Systems No shortness of breath, no chest pain    Objective:   Physical Exam Gen: NAD, cooperative, obese Abd: Open abdominal wound at the midline measuring roughly 6 inches in length. The generous adipose tissue is pink and normal appearing. The deeper abdominal fascial wall appears coated in a thick, green exudate with substantial odor. There is both blue and clear suture material visible in the bed of the wound. The superior aspect of the wound appears to be the focus of the green exudate, with a clear thread of suture measuring roughly 3 inches coated with fibrinous green exudate.  There is no evidence of dehiscence. No superficial erythema to indicate cellulitis    Assessment:         Plan:

## 2012-01-14 ENCOUNTER — Ambulatory Visit (INDEPENDENT_AMBULATORY_CARE_PROVIDER_SITE_OTHER): Payer: PRIVATE HEALTH INSURANCE | Admitting: Surgery

## 2012-01-14 ENCOUNTER — Encounter (INDEPENDENT_AMBULATORY_CARE_PROVIDER_SITE_OTHER): Payer: Self-pay | Admitting: Surgery

## 2012-01-14 VITALS — BP 130/82 | HR 84 | Temp 97.4°F | Resp 16 | Ht 65.0 in | Wt 280.4 lb

## 2012-01-14 DIAGNOSIS — I96 Gangrene, not elsewhere classified: Secondary | ICD-10-CM | POA: Insufficient documentation

## 2012-01-14 DIAGNOSIS — K56609 Unspecified intestinal obstruction, unspecified as to partial versus complete obstruction: Secondary | ICD-10-CM

## 2012-01-14 DIAGNOSIS — I9789 Other postprocedural complications and disorders of the circulatory system, not elsewhere classified: Secondary | ICD-10-CM

## 2012-01-14 NOTE — Patient Instructions (Addendum)
WOUND CARE  It is important that the wound be kept open.   -Keeping the skin edges apart will allow the wound to gradually heal from the base upwards.   - If the skin edges of the wound close too early, a new fluid pocket can form and infection can occur. -This is the reason to pack deeper wounds with gauze or ribbon -This is why drained wounds cannot be sewed closed right away  A healthy wound should form a lining of bright red "beefy" granulating tissue that will help shrink the wound and help the edges grow new skin into it.   -A little mucus / yellow discharge is normal (the body's natural way to try and form a scab) and should be gently washed off with soap and water with daily dressing changes.  -Green or foul smelling drainage implies bacterial colonization and can slow wound healing - a short course of antibiotic ointment (3-5 days) can help it clear up.  Call the doctor if it does not improve or worsens  -Avoid use of antibiotic ointments for more than a week as they can slow wound healing over time.    -Sometimes other wound care products will be used to reduce need for dressing changes and/or help clean up dirty wounds -Sometimes the surgeon needs to debride the wound in the office to remove dead or infected tissue out of the wound so it can heal more quickly and safely.    Change the dressing at least once a day -Wash the wound with mild soap and water gently every day.  It is good to shower or bathe the wound to help it clean out. -Use roll gauze for large wounds  (it does not need to be sterile, just clean) -Keep the raw wound moist with a little saline or [place] (saline) gel on the gauze.  -A dry wound will take longer to heal.  -Keep the skin dry around the wound to prevent breakdown and irritation. -Pack the wound down to the base -The goal is to keep the skin apart, not overpack the wound -Use a Q-tip or blunt-tipped kabob stick toothpick to push the gauze down to the base in  narrow or deep wounds   -Cover with a clean gauze and tape -paper or Medipore tape tend to be gentle on the skin -rotate the orientation of the tape to avoid repeated stress/trauma on the skin -using an ACE or Coban wrap on wounds on arms or legs can be used instead.  Complete all antibiotics through the entire prescription to help the infection heal and prevent new places of infection   Returning the see the surgeon is helpful to follow the healing process and help the wound close as fast as possible.

## 2012-01-14 NOTE — Progress Notes (Signed)
Subjective:     Patient ID: Tina Chase, female   DOB: Jun 27, 1941, 71 y.o.   MRN: [id number]  HPI  Tina Chase  [birth year] [id number]  Patient Care Team: Daryel Gerald, MD as PCP - General (Internal Medicine) Adolph Pollack, MD as Consulting Physician (General Surgery)  This patient is a 71 y.o.female who presents today for surgical evaluation.   Procedure: Exploratory laparotomy, lysis of adhesions, resection of small bowel incarcerated with an a hernia. Primary ventral wall hernia repair 12/04/2011  The patient followed up with her primary care physician. There was concern of foul drainage from the wound. She was sent to [place] for evaluation. She is eating well. Regular bowel movements. Soreness under control. Some occasional pulling from either hip.   She's been trying to do daily dressing changes sometimes twice a day. He uses a large Tegaderm plastic covering for dressing changes. Occasionally notices a foul odor.  Patient Active Problem List  Diagnosis  . HYPERPARATHYROIDISM NOS  . HYPERLIPIDEMIA  . HYPERGLYCEMIA  . Chest pain  . DYSPNEA  . S/p nephrectomy  . Routine health maintenance  . 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 Asthma  . Small bowel obstruction due to strangulated incisional hernia s/p expl lap, small bowel resection, primary hernia repair 12/04/11  . Respiratory failure following trauma and surgery  . Tachycardia  . Acute on chronic diastolic CHF (congestive heart failure)  . Pulmonary embolism  . Hypertension  . Obesity, Class III, BMI 40-49.9 (morbid obesity)  . Anemia  . Diarrhea following gastrointestinal surgery  . Open abdominal wall wound  . Necrosis of surgical wound    Past Medical History  Diagnosis Date  . Hyperlipidemia   . Hypertension   . Hyperparathyroidism   . Obesity   . Hyperglycemia   . Hypercalcemia   . Asthma   . Shortness of breath     wtih exertion   . Chronic kidney disease     right non functioning kidney     . Arthritis     left knee  and left shoulder     Past Surgical History  Procedure Date  . Tubal ligation   . Other surgical history     D and C x 3   . Laparoscopic nephrectomy 08/19/2011    Procedure: LAPAROSCOPIC NEPHRECTOMY;  Surgeon: Milford Cage, MD;  Location: [facility];  Service: Urology;  Laterality: Right;  . Laparotomy 12/04/2011    Procedure: EXPLORATORY LAPAROTOMY;  Surgeon: Adolph Pollack, MD;  Location: [facility];  Service: General;  Laterality: Tina Chase;   bowel resection   . Incisional hernia repair 12/04/2011    Procedure: HERNIA REPAIR INCISIONAL;  Surgeon: Adolph Pollack, MD;  Location: [facility];  Service: General;  Laterality: Tina Chase;  . Application of wound vac 12/04/2011    Procedure: APPLICATION OF WOUND VAC;  Surgeon: Adolph Pollack, MD;  Location: [facility];  Service: General;;  x two  . Kidney cyst removal     History   Social History  . Marital Status: Single    Spouse Name: Tina Chase    Number of Children: Tina Chase  . Years of Education: Tina Chase   Occupational History  . retired     Lawyer   Social History Main Topics  . Smoking status: Former Smoker    Types: Cigarettes    Quit date: 08/03/1968  . Smokeless tobacco: Never Used  . Alcohol Use: No  . Drug Use: No  . Sexually Active: Not Currently  Other Topics Concern  . Not on file   Social History Narrative  . No narrative on file    Family History  Problem Relation Age of Onset  . Stroke Neg Hx   . Cancer Neg Hx   . Nephrolithiasis Neg Hx   . Hyperlipidemia Mother   . Hypertension Mother   . Heart disease Father     had a heart attack at age 19  . Heart attack Father 50  . Heart disease Brother     at age 64  . Heart attack Brother 50    Current Outpatient Prescriptions  Medication Sig Dispense Refill  . amLODipine (NORVASC) 10 MG tablet Take 10 mg by mouth daily before lunch.       . ferrous fumarate (HEMOCYTE - 106 MG FE) 325 (106 FE) MG TABS Take 1 tablet by mouth daily.      . ferrous  sulfate 325 (65 FE) MG tablet Take 1 tablet (325 mg total) by mouth 2 (two) times daily with a meal.  60 tablet  0  . furosemide (LASIX) 40 MG tablet Take 1 tablet (40 mg total) by mouth 2 (two) times daily.  60 tablet  0  . metoprolol (LOPRESSOR) 50 MG tablet Take 1 tablet (50 mg total) by mouth 2 (two) times daily.  60 tablet  11  . Multiple Vitamins-Iron (MULTIVITAMINS WITH IRON) TABS Take 1 tablet by mouth daily.      . potassium chloride SA (K-DUR,KLOR-CON) 20 MEQ tablet Take 2 tablets (40 mEq total) by mouth 2 (two) times daily.  60 tablet  0  . PROAIR HFA 108 (90 BASE) MCG/ACT inhaler USE 2 PUFFS EVERY 4 TO 6 HOURS AS NEEDEDFOR WHEEZING  1 Inhaler  10  . vitamin C (ASCORBIC ACID) 500 MG tablet Take 500 mg by mouth daily.      Marland Kitchen warfarin (COUMADIN) 2.5 MG tablet Take 1 tablet (2.5 mg total) by mouth one time only at 6 PM.  30 tablet  0  . Zinc Sulfate 220 (50 ZN) MG TBCR Take 220 mg by mouth daily.      Marland Kitchen enoxaparin (LOVENOX) 150 MG/ML injection Inject 0.57 mLs (85 mg total) into the skin every 12 (twelve) hours.  2 Syringe  0     Allergies  Allergen Reactions  . Hydralazine Anaphylaxis    Swelling tongue.  . Aspirin     REACTION: hives  . Crab (Shellfish Allergy) Hives  . Lisinopril     REACTION: angioedema    BP 130/82  Pulse 84  Temp 97.4 F (36.3 C) (Temporal)  Resp 16  Ht 5\' 5"  (1.651 m)  Wt 280 lb 6.4 oz (127.189 kg)  BMI 46.66 kg/m2  LMP 09/23/1986  Ct Angio Chest W/cm &/or Wo Cm  12/18/2011  *RADIOLOGY REPORT*  Clinical Data: Suspected pulmonary embolism on the same day CT abdomen pelvis.  CT ANGIOGRAPHY CHEST  Technique:  Multidetector CT imaging of the chest using the standard protocol during bolus administration of intravenous contrast. Multiplanar reconstructed images including MIPs were obtained and reviewed to evaluate the vascular anatomy.  Contrast:  70mL OMNIPAQUE IOHEXOL 350 MG/ML SOLN  Comparison: CT 12/18/2011  Findings: There is a filling defect within the  main pulmonary artery which spans the branch point of the left and right main pulmonary artery.  There is a large tubular filling defect within the right lower lobe pulmonary arteries.  There is smaller tubular filling defect within the left lower lobe pulmonary arteries.  These findings are consistent acute thrombi emboli.  The overall clot burden is moderate to severe.  There is no evidence of right ventricular strain.  No pleural fluid or pneumothorax.  Airways are normal.  No evidence of pulmonary infarction.  No aggressive osseous lesions.  IMPRESSION:   Acute pulmonary thromboembolism spanning the left and right main pulmonary arteries and extending into the lower lobe pulmonary arteries.  Clot burden is moderate to severe.  Findings conveyed to Jenita Seashore PA and the Collene Gobble patient's nurse on 12/18/2011 at 1430 hours.  Original Report Authenticated By: Genevive Bi, M.D.   Ct Abdomen Pelvis W Contrast  12/18/2011  *RADIOLOGY REPORT*  Clinical Data: Recent hernia at this repair.  Elevated white blood cell count  CT ABDOMEN AND PELVIS WITH CONTRAST  Technique:  Multidetector CT imaging of the abdomen and pelvis was performed following the standard protocol during bolus administration of intravenous contrast.  Contrast: 100 ml Omnipaque 300  Comparison: CT 12/03/2011  Findings: Lung bases are clear.  No pericardial fluid.  There is a pulmonary embolism within the right lower lobe pulmonary artery. More central pulmonary arteries are not evaluated.  No focal hepatic lesion.  There is no fluid collection or abscess within the upper peritoneal space.  There is an open ventral wound. There is a linear granulation / inflammation  process with scattered pockets of gas in the subcutaneous tissues within the right abdominal wall which measures 12 cm x 3 cm.  The small bowel and colon appear normal.  No bowel obstruction. The left kidney is normal.  The right kidney is absent.  The spleen adrenal glands  are normal.  No free fluid the pelvis.  The bladder and uterus appear normal. The superior to the uterus and along the left ovary is a  4.1 x 2.3 s 2.1 cm fluid collection.  This has an enhancing rim and is new from prior.  No fluid the posterior cul-de-sac.  IMPRESSION:  1.  Small fluid collections superior to the uterus is new from prior has a thin enhancing rim  consistent with a  small pelvic abscess.  2.  Open ventral wound.  Within the right subcutaneous tissues of the abdominal wall there is a large inflammatory reaction with gas interspersed measuring up to 12 cm which extends to the ventral wound. 3.  Acute pulmonary embolism within the right lower lobe pulmonary artery.  Recommend CTA thorax for further evaluation of the more central pulmonary arteries.  Findings discussed with Will Marlyne Beards 03/17/2012 at 1600 hours  Original Report Authenticated By: Genevive Bi, M.D.     Review of Systems  Constitutional: Negative for fever, chills and diaphoresis.  HENT: Negative for ear pain, sore throat and trouble swallowing.   Eyes: Negative for photophobia and visual disturbance.  Respiratory: Negative for cough and choking.   Cardiovascular: Negative for chest pain and palpitations.  Gastrointestinal: Negative for nausea, vomiting, abdominal pain, diarrhea, constipation, anal bleeding and rectal pain.  Genitourinary: Negative for dysuria, frequency and difficulty urinating.  Musculoskeletal: Negative for myalgias and gait problem.  Skin: Negative for color change, pallor and rash.  Neurological: Negative for dizziness, speech difficulty, weakness and numbness.  Hematological: Negative for adenopathy.  Psychiatric/Behavioral: Negative for confusion and agitation. The patient is not nervous/anxious.        Objective:   Physical Exam  Constitutional: She is oriented to person, place, and time. She appears well-developed and well-nourished. No distress.  HENT:  Head: Normocephalic.    Mouth/Throat:  Oropharynx is clear and moist. No oropharyngeal exudate.  Eyes: Conjunctivae and EOM are normal. Pupils are equal, round, and reactive to light. No scleral icterus.  Neck: Normal range of motion. No tracheal deviation present.  Cardiovascular: Normal rate and intact distal pulses.   Pulmonary/Chest: Effort normal. No respiratory distress. She exhibits no tenderness.  Abdominal: Soft. She exhibits no distension and no mass. There is no tenderness. There is no guarding. Hernia confirmed negative in the right inguinal area and confirmed negative in the left inguinal area.       Wound with good granulation with normal healing ridges first 4 cm.  Then deep fascial necrosis & exposed stitches.  No hernia - yet  Genitourinary: No vaginal discharge found.  Musculoskeletal: Normal range of motion. She exhibits no tenderness.  Lymphadenopathy:       Right: No inguinal adenopathy present.       Left: No inguinal adenopathy present.  Neurological: She is alert and oriented to person, place, and time. No cranial nerve deficit. She exhibits normal muscle tone. Coordination normal.  Skin: Skin is warm and dry. No rash noted. She is not diaphoretic.  Psychiatric: She has a normal mood and affect. Her behavior is normal.       Assessment:     Fascial necrosis    Plan:     Wound debridement:  I made inspection of the wound.  Because of significant necrotic tissue & exposed old stitches, I recommended debridement to help improve wound health and speed healing.    I used sharp dissection to remove necrotic tissue until I reached more healthy, vascularized tissue.  I removed numerous exposed stitches that had pulled out/through.  Hemostasis was good.  I packed with clean roll gauze & dry dressing.  The patient tolerated the procedure well.

## 2012-01-18 ENCOUNTER — Ambulatory Visit (INDEPENDENT_AMBULATORY_CARE_PROVIDER_SITE_OTHER): Payer: PRIVATE HEALTH INSURANCE | Admitting: Pharmacist

## 2012-01-18 DIAGNOSIS — Z7901 Long term (current) use of anticoagulants: Secondary | ICD-10-CM

## 2012-01-18 DIAGNOSIS — I2699 Other pulmonary embolism without acute cor pulmonale: Secondary | ICD-10-CM

## 2012-01-18 LAB — POCT INR: INR: 2.1

## 2012-01-18 MED ORDER — WARFARIN SODIUM 5 MG PO TABS: ORAL_TABLET | ORAL | Status: DC

## 2012-01-18 NOTE — Patient Instructions (Signed)
Patient instructed to take medications as defined in the Anti-coagulation Track section of this encounter.  Patient instructed to take today's dose.  Patient verbalized understanding of these instructions.    

## 2012-01-18 NOTE — Progress Notes (Signed)
Anti-Coagulation Progress Note  Tina Chase is a 71 y.o. female who is currently on an anti-coagulation regimen.    RECENT RESULTS: Recent results are below, the most recent result is correlated with a dose of 20 mg. Over the past 4 days since seen last week by Dr. Abner Greenspan.  This was first visit to anticoagulation management clinic for this patient whose extensive history was reviewed back to January 2013 which has included surgical procedures (nephrectomy) with subsequent complications including development of venous thrombosis (PE). We spent twenty-five minutes discussing her disease state and the possible reasons why she developed PE as well as focusing upon her drug therapy (warfarin) and its drug-drug/drug-disease/drug-food interaction potential as well as signs and symptoms associated with blood being "too thin" and signs and symptoms suggestive of recurrence of PE. Dosing modification from her 20mg /4 day regimen was effected and changed to reflect 30mg /7 days with repeat RTC on Monday 24-JUN-13. Twenty-five minutes were spent with patient discussing all of these issues. Lab Results  Component Value Date   INR 2.10 01/18/2012   INR 2.0 01/13/2012   INR 2.56* 12/23/2011    ANTI-COAG DOSE:   Latest dosing instructions   Total [name]   30 3.75 mg 5 mg 3.75 mg 5 mg 3.75 mg 5 mg 3.75 mg    (2.5 mg1.5) (2.5 mg2) (2.5 mg1.5) (2.5 mg2) (2.5 mg1.5) (2.5 mg2) (2.5 mg1.5)         ANTICOAG SUMMARY: Anticoagulation Episode Summary              Current INR goal 2.0-3.0 Next INR check 01/25/2012   INR from last check 2.10 (01/18/2012)     Weekly max dose (mg)  Target end date 12/17/2012   Indications Pulmonary embolism, Encounter for long-term (current) use of anticoagulants   [facility] check location [facility]    Send INR reminders to    Comments Known provoking cause first episode of pulmonary embolism diagnosed May 2013 after surgery and  prolonged inpatient stay with surgical wound.             ANTICOAG TODAY: Anticoagulation Summary as of 01/18/2012              INR goal 2.0-3.0     Selected INR 2.10 (01/18/2012) Next INR check 01/25/2012   Weekly max dose (mg)  Target end date 12/17/2012   Indications Pulmonary embolism, Encounter for long-term (current) use of anticoagulants    Anticoagulation Episode Summary              [facility]R check location [facility] lab    Send INR reminders to    Comments Known provoking cause first episode of pulmonary embolism diagnosed May 2013 after surgery and prolonged inpatient stay with surgical wound.             PATIENT INSTRUCTIONS: Patient Instructions  Patient instructed to take medications as defined in the Anti-coagulation Track section of this encounter.  Patient instructed to take today's dose.  Patient verbalized understanding of these instructions.        FOLLOW-UP Return in about 7 days (around 01/25/2012) for Follow up INR.  Hulen Luster, III Pharm.D., CACP

## 2012-01-19 ENCOUNTER — Telehealth: Payer: Self-pay | Admitting: *Deleted

## 2012-01-19 NOTE — Telephone Encounter (Signed)
Dr Abner Greenspan states ok to fax Dr Saralyn Pilar notes from 01/18/12 about Coumadin dosing- insurance company will not accept as directed. Info faxed [phone] [facility]. Stanton Kidney Valdemar Mcclenahan RN 01/19/12 4:30PM

## 2012-01-25 ENCOUNTER — Ambulatory Visit (INDEPENDENT_AMBULATORY_CARE_PROVIDER_SITE_OTHER): Payer: PRIVATE HEALTH INSURANCE | Admitting: Pharmacist

## 2012-01-25 DIAGNOSIS — Z7901 Long term (current) use of anticoagulants: Secondary | ICD-10-CM

## 2012-01-25 DIAGNOSIS — I2699 Other pulmonary embolism without acute cor pulmonale: Secondary | ICD-10-CM

## 2012-01-25 MED ORDER — WARFARIN SODIUM 5 MG PO TABS: ORAL_TABLET | ORAL | Status: DC

## 2012-01-25 NOTE — Progress Notes (Signed)
Anti-Coagulation Progress Note  Tina Chase is a 71 y.o. female who is currently on an anti-coagulation regimen.    RECENT RESULTS: Recent results are below, the most recent result is correlated with a dose of 30 mg. per week: Lab Results  Component Value Date   INR 1.70 01/25/2012   INR 2.10 01/18/2012   INR 2.0 01/13/2012    ANTI-COAG DOSE:   Latest dosing instructions   Total [name]   47.5 5 mg 10 mg 10 mg 7.5 mg 5 mg 5 mg 5 mg    (5 mg1) (5 mg2) (5 mg2) (5 mg1.5) (5 mg1) (5 mg1) (5 mg1)         ANTICOAG SUMMARY: Anticoagulation Episode Summary              Current INR goal 2.0-3.0 Next INR check 02/01/2012   INR from last check 1.70! (01/25/2012)     Weekly max dose (mg)  Target end date 12/17/2012   Indications Pulmonary embolism, Encounter for long-term (current) use of anticoagulants   [facility] location [facility]    Send INR reminders to    Comments Known provoking cause first episode of pulmonary embolism diagnosed May 2013 after surgery and prolonged inpatient stay with surgical wound.             ANTICOAG TODAY: Anticoagulation Summary as of 01/25/2012              INR goal 2.0-3.0     Selected INR 1.70! (01/25/2012) Next INR check 02/01/2012   Weekly max dose (mg)  Target end date 12/17/2012   Indications Pulmonary embolism, Encounter for long-term (current) use of anticoagulants    Anticoagulation Episode Summary              [facility]R check location [facility]    Send INR reminders to    Comments Known provoking cause first episode of pulmonary embolism diagnosed May 2013 after surgery and prolonged inpatient stay with surgical wound.             PATIENT INSTRUCTIONS: Patient Instructions  Patient instructed to take medications as defined in the Anti-coagulation Track section of this encounter.  Patient instructed to take today's dose.  Patient verbalized understanding of these instructions.          FOLLOW-UP Return in 7 days (on 02/01/2012) for Follow up INR at 0915h.  Hulen Luster, III Pharm.D., CACP

## 2012-01-25 NOTE — Patient Instructions (Signed)
Patient instructed to take medications as defined in the Anti-coagulation Track section of this encounter.  Patient instructed to take today's dose.  Patient verbalized understanding of these instructions.    

## 2012-01-26 ENCOUNTER — Encounter (INDEPENDENT_AMBULATORY_CARE_PROVIDER_SITE_OTHER): Payer: PRIVATE HEALTH INSURANCE | Admitting: General Surgery

## 2012-02-01 ENCOUNTER — Ambulatory Visit (INDEPENDENT_AMBULATORY_CARE_PROVIDER_SITE_OTHER): Payer: PRIVATE HEALTH INSURANCE | Admitting: Pharmacist

## 2012-02-01 DIAGNOSIS — Z7901 Long term (current) use of anticoagulants: Secondary | ICD-10-CM

## 2012-02-01 DIAGNOSIS — I2699 Other pulmonary embolism without acute cor pulmonale: Secondary | ICD-10-CM

## 2012-02-01 NOTE — Patient Instructions (Signed)
Patient instructed to take medications as defined in the Anti-coagulation Track section of this encounter.  Patient instructed to take today's dose.  Patient verbalized understanding of these instructions.    

## 2012-02-01 NOTE — Progress Notes (Signed)
Anti-Coagulation Progress Note  Tina Chase is a 71 y.o. female who is currently on an anti-coagulation regimen.    RECENT RESULTS: Recent results are below, the most recent result is correlated with a dose of 47.5 mg. per week: Lab Results  Component Value Date   INR 3/70 02/01/2012   INR 1.70 01/25/2012   INR 2.10 01/18/2012    ANTI-COAG DOSE:   Latest dosing instructions   Total [name]   42.5 5 mg 7.5 mg 5 mg 7.5 mg 5 mg 7.5 mg 5 mg    (5 mg1) (5 mg1.5) (5 mg1) (5 mg1.5) (5 mg1) (5 mg1.5) (5 mg1)         ANTICOAG SUMMARY: Anticoagulation Episode Summary              Current INR goal 2.0-3.0 Next INR check 02/08/2012   INR from last check 3/70 (02/01/2012)     The INR is not numeric and cannot be flagged as normal/abnormal.     Weekly max dose (mg)  Target end date 12/17/2012   Indications Pulmonary embolism, Encounter for long-term (current) use of anticoagulants   [facility]R check location [facility]    Send INR reminders to    Comments Known provoking cause first episode of pulmonary embolism diagnosed May 2013 after surgery and prolonged inpatient stay with surgical wound.             ANTICOAG TODAY: Anticoagulation Summary as of 02/01/2012              INR goal 2.0-3.0     Selected INR 3/70 (02/01/2012)     The INR is not numeric and cannot be flagged as normal/abnormal. Next INR check 02/08/2012   Weekly max dose (mg)  Target end date 12/17/2012   Indications Pulmonary embolism, Encounter for long-term (current) use of anticoagulants    Anticoagulation Episode Summary              [facility] check location [facility]    Send INR reminders to    Comments Known provoking cause first episode of pulmonary embolism diagnosed May 2013 after surgery and prolonged inpatient stay with surgical wound.             PATIENT INSTRUCTIONS: Patient Instructions  Patient instructed to take medications as defined in the  Anti-coagulation Track section of this encounter.  Patient instructed to take today's dose.  Patient verbalized understanding of these instructions.        FOLLOW-UP Return in about 7 days (around 02/08/2012), or Follow up INR at 0845h.  Hulen Luster, III Pharm.D., CACP

## 2012-02-08 ENCOUNTER — Other Ambulatory Visit: Payer: Self-pay | Admitting: *Deleted

## 2012-02-08 ENCOUNTER — Ambulatory Visit (INDEPENDENT_AMBULATORY_CARE_PROVIDER_SITE_OTHER): Payer: PRIVATE HEALTH INSURANCE | Admitting: General Surgery

## 2012-02-08 ENCOUNTER — Encounter (INDEPENDENT_AMBULATORY_CARE_PROVIDER_SITE_OTHER): Payer: Self-pay | Admitting: General Surgery

## 2012-02-08 ENCOUNTER — Ambulatory Visit (INDEPENDENT_AMBULATORY_CARE_PROVIDER_SITE_OTHER): Payer: PRIVATE HEALTH INSURANCE | Admitting: Pharmacist

## 2012-02-08 VITALS — BP 134/84 | HR 101 | Temp 97.4°F | Ht 65.0 in | Wt 284.0 lb

## 2012-02-08 DIAGNOSIS — Z7901 Long term (current) use of anticoagulants: Secondary | ICD-10-CM

## 2012-02-08 DIAGNOSIS — Z9889 Other specified postprocedural states: Secondary | ICD-10-CM

## 2012-02-08 DIAGNOSIS — I2699 Other pulmonary embolism without acute cor pulmonale: Secondary | ICD-10-CM

## 2012-02-08 LAB — POCT INR: INR: 4.3

## 2012-02-08 MED ORDER — FERROUS SULFATE 325 (65 FE) MG PO TABS: 325.0000 mg | ORAL_TABLET | Freq: Two times a day (BID) | ORAL | Status: DC

## 2012-02-08 MED ORDER — TAB-A-VITE/IRON PO TABS: 1.0000 | ORAL_TABLET | Freq: Every day | ORAL | Status: DC

## 2012-02-08 MED ORDER — POTASSIUM CHLORIDE CRYS ER 20 MEQ PO TBCR: 40.0000 meq | EXTENDED_RELEASE_TABLET | Freq: Two times a day (BID) | ORAL | Status: DC

## 2012-02-08 MED ORDER — VITAMIN C 500 MG PO TABS: 500.0000 mg | ORAL_TABLET | Freq: Every day | ORAL | Status: DC

## 2012-02-08 NOTE — Progress Notes (Signed)
Anti-Coagulation Progress Note  Tina Chase is a 71 y.o. female who is currently on an anti-coagulation regimen.    RECENT RESULTS: Recent results are below, the most recent result is correlated with a dose of 42.5 mg. per week: Lab Results  Component Value Date   INR 4.30 02/08/2012   INR 3/70 02/01/2012   INR 1.70 01/25/2012    ANTI-COAG DOSE:   Latest dosing instructions   Total [name]   37.5 5 mg 5 mg 5 mg 7.5 mg 5 mg 5 mg 5 mg    (5 mg1) (5 mg1) (5 mg1) (5 mg1.5) (5 mg1) (5 mg1) (5 mg1)         ANTICOAG SUMMARY: Anticoagulation Episode Summary              Current INR goal 2.0-3.0 Next INR check 02/22/2012   INR from last check 4.30! (02/08/2012)     Weekly max dose (mg)  Target end date 12/17/2012   Indications Pulmonary embolism, Encounter for long-term (current) use of anticoagulants   [facility] check location [facility]    Send INR reminders to    Comments Known provoking cause first episode of pulmonary embolism diagnosed May 2013 after surgery and prolonged inpatient stay with surgical wound.             ANTICOAG TODAY: Anticoagulation Summary as of 02/08/2012              INR goal 2.0-3.0     Selected INR 4.30! (02/08/2012) Next INR check 02/22/2012   Weekly max dose (mg)  Target end date 12/17/2012   Indications Pulmonary embolism, Encounter for long-term (current) use of anticoagulants    Anticoagulation Episode Summary              INR check location [facility]    Send INR reminders to    Comments Known provoking cause first episode of pulmonary embolism diagnosed May 2013 after surgery and prolonged inpatient stay with surgical wound.             PATIENT INSTRUCTIONS: Patient Instructions  Patient instructed to take medications as defined in the Anti-coagulation Track section of this encounter.  Patient instructed to take today's dose.  Patient verbalized understanding of these instructions.        FOLLOW-UP Return in 2 weeks (on 02/22/2012) for Follow up INR at 0915h.  Hulen Luster, III Pharm.D., CACP

## 2012-02-08 NOTE — Progress Notes (Signed)
She is here for another postoperative visit and continues to improve overall. Her midline wound is healing in by secondary intention.  On exam, the midline was opened with good granulation tissue present and no purulent drainage. I repacked the wound with saline moistened gauze followed by a dry dressing. The right sided wound is solid without evidence of recurrent hernia.  Assessment: Status post emergency repair of strangulated incisional hernia with complicated postoperative course. Midline wound is healing by secondary intention and she is improving.  Plan: Continue current wound care. We discussed the fact that she could get a hernia through the midline wound or even potentially a recurrent hernia through the right transverse incision. Return visit in one month.

## 2012-02-08 NOTE — Patient Instructions (Signed)
Patient instructed to take medications as defined in the Anti-coagulation Track section of this encounter.  Patient instructed to take today's dose.  Patient verbalized understanding of these instructions.    

## 2012-02-08 NOTE — Patient Instructions (Signed)
Continue your current wound care.

## 2012-02-10 ENCOUNTER — Telehealth (INDEPENDENT_AMBULATORY_CARE_PROVIDER_SITE_OTHER): Payer: Self-pay | Admitting: General Surgery

## 2012-02-10 NOTE — Telephone Encounter (Signed)
Onalee Hua, nurse with [facility], calling to get orders to visit once a week for the remainder of the month to keep check on pt's wound care.  She has appt with [facility] on 03/07/12 in follow-up.  Please advise.

## 2012-02-22 ENCOUNTER — Ambulatory Visit (INDEPENDENT_AMBULATORY_CARE_PROVIDER_SITE_OTHER): Payer: PRIVATE HEALTH INSURANCE | Admitting: Pharmacist

## 2012-02-22 DIAGNOSIS — I2699 Other pulmonary embolism without acute cor pulmonale: Secondary | ICD-10-CM

## 2012-02-22 DIAGNOSIS — Z7901 Long term (current) use of anticoagulants: Secondary | ICD-10-CM

## 2012-02-22 NOTE — Progress Notes (Signed)
Anti-Coagulation Progress Note  Tina Chase is a 71 y.o. female who is currently on an anti-coagulation regimen.    RECENT RESULTS: Recent results are below, the most recent result is correlated with a dose of 37.5 mg. per week: Lab Results  Component Value Date   INR 2.50 02/22/2012   INR 4.30 02/08/2012   INR 3/70 02/01/2012    ANTI-COAG DOSE:   Latest dosing instructions   Total [name]   37.5 5 mg 5 mg 5 mg 7.5 mg 5 mg 5 mg 5 mg    (5 mg1) (5 mg1) (5 mg1) (5 mg1.5) (5 mg1) (5 mg1) (5 mg1)         ANTICOAG SUMMARY: Anticoagulation Episode Summary              Current INR goal 2.0-3.0 Next INR check 03/21/2012   INR from last check 2.50 (02/22/2012)     Weekly max dose (mg)  Target end date 12/17/2012   Indications Pulmonary embolism, Encounter for long-term (current) use of anticoagulants   [facility] check location [facility]    Send INR reminders to    Comments Known provoking cause first episode of pulmonary embolism diagnosed May 2013 after surgery and prolonged inpatient stay with surgical wound.             ANTICOAG TODAY: Anticoagulation Summary as of 02/22/2012              INR goal 2.0-3.0     Selected INR 2.50 (02/22/2012) Next INR check 03/21/2012   Weekly max dose (mg)  Target end date 12/17/2012   Indications Pulmonary embolism, Encounter for long-term (current) use of anticoagulants    Anticoagulation Episode Summary              INR check location [facility]    Send INR reminders to    Comments Known provoking cause first episode of pulmonary embolism diagnosed May 2013 after surgery and prolonged inpatient stay with surgical wound.             PATIENT INSTRUCTIONS: Patient Instructions  Patient instructed to take medications as defined in the Anti-coagulation Track section of this encounter.  Patient instructed to take today's dose.  Patient verbalized understanding of these instructions.          FOLLOW-UP Return in 4 weeks (on 03/21/2012) for Follow up INR at 0915h.  Hulen Luster, III Pharm.D., CACP

## 2012-02-22 NOTE — Patient Instructions (Signed)
Patient instructed to take medications as defined in the Anti-coagulation Track section of this encounter.  Patient instructed to take today's dose.  Patient verbalized understanding of these instructions.    

## 2012-03-07 ENCOUNTER — Encounter (INDEPENDENT_AMBULATORY_CARE_PROVIDER_SITE_OTHER): Payer: Self-pay | Admitting: General Surgery

## 2012-03-07 ENCOUNTER — Ambulatory Visit (INDEPENDENT_AMBULATORY_CARE_PROVIDER_SITE_OTHER): Payer: PRIVATE HEALTH INSURANCE | Admitting: General Surgery

## 2012-03-07 VITALS — BP 132/84 | HR 71 | Temp 97.3°F | Ht 65.0 in | Wt 278.8 lb

## 2012-03-07 DIAGNOSIS — S31109A Unspecified open wound of abdominal wall, unspecified quadrant without penetration into peritoneal cavity, initial encounter: Secondary | ICD-10-CM

## 2012-03-07 NOTE — Patient Instructions (Signed)
Continue current wound care

## 2012-03-07 NOTE — Progress Notes (Signed)
Subjective:     Patient ID: Tina Chase, female   DOB: 08/12/1940, 70 y.o.   MRN: [id number]  HPI  She is here for followup of her open abdominal wound. She is doing her own dressing changes. These are normal saline damp to dry.   Review of Systems  She is eating well. Her bowels are moving without difficulty.     Objective:   Physical Exam Gen.-morbidly obese female in no acute distress.  Abdomen-midline wound is significantly smaller with no necrotic tissue and good granulation tissue.    Assessment:     Open abdominal wound healing in well by secondary intention.    Plan:     Continue current wound care. Return visit in 4 weeks.

## 2012-03-08 ENCOUNTER — Telehealth (INDEPENDENT_AMBULATORY_CARE_PROVIDER_SITE_OTHER): Payer: Self-pay | Admitting: General Surgery

## 2012-03-08 NOTE — Telephone Encounter (Signed)
Onalee Hua, nurse with [facility], calling for extension of orders.  Gave verbal order for visit one time per week until her next appt with Dr. Abbey Chatters on Sept 4th.

## 2012-03-17 ENCOUNTER — Other Ambulatory Visit: Payer: Self-pay | Admitting: *Deleted

## 2012-03-17 MED ORDER — FUROSEMIDE 40 MG PO TABS: 40.0000 mg | ORAL_TABLET | Freq: Two times a day (BID) | ORAL | Status: DC

## 2012-03-17 NOTE — Telephone Encounter (Signed)
Patient with diagnosis of diastolic heart failure while in hospital for surgical issue.  She was placed on lasix for volume overload post surgery.  She has not been seen in IM clinic since discharge and it is unclear if she continues to need this medication.  Will refill one month supply, but she needs to come in to get BMET and follow up with her new PCP Dr. Lonzo Cloud asap.

## 2012-03-17 NOTE — Addendum Note (Signed)
Addended by: Debe Coder B on: 03/17/2012 05:15 PM   Modules accepted: Orders

## 2012-03-17 NOTE — Telephone Encounter (Signed)
Message sent to front office to schedule pt an appt. 

## 2012-03-18 ENCOUNTER — Telehealth: Payer: Self-pay | Admitting: *Deleted

## 2012-03-18 NOTE — Telephone Encounter (Signed)
ERROR

## 2012-03-21 ENCOUNTER — Ambulatory Visit (INDEPENDENT_AMBULATORY_CARE_PROVIDER_SITE_OTHER): Payer: PRIVATE HEALTH INSURANCE | Admitting: Pharmacist

## 2012-03-21 DIAGNOSIS — I2699 Other pulmonary embolism without acute cor pulmonale: Secondary | ICD-10-CM

## 2012-03-21 DIAGNOSIS — Z7901 Long term (current) use of anticoagulants: Secondary | ICD-10-CM

## 2012-03-21 NOTE — Patient Instructions (Signed)
Patient instructed to take medications as defined in the Anti-coagulation Track section of this encounter.  Patient instructed to take today's dose.  Patient verbalized understanding of these instructions.    

## 2012-03-21 NOTE — Progress Notes (Signed)
Anti-Coagulation Progress Note  Tina Chase is a 71 y.o. female who is currently on an anti-coagulation regimen.    RECENT RESULTS: Recent results are below, the most recent result is correlated with a dose of 37.5 mg. per week: Lab Results  Component Value Date   INR 2.40 03/21/2012   INR 2.50 02/22/2012   INR 4.30 02/08/2012    ANTI-COAG DOSE:   Latest dosing instructions   Total [name]   37.5 5 mg 5 mg 5 mg 7.5 mg 5 mg 5 mg 5 mg    (5 mg1) (5 mg1) (5 mg1) (5 mg1.5) (5 mg1) (5 mg1) (5 mg1)         ANTICOAG SUMMARY: Anticoagulation Episode Summary              Current INR goal 2.0-3.0 Next INR check 04/18/2012   INR from last check 2.40 (03/21/2012)     Weekly max dose (mg)  Target end date 12/17/2012   Indications Pulmonary embolism, Encounter for long-term (current) use of anticoagulants   [facility] location [facility]    Send INR reminders to    Comments Known provoking cause first episode of pulmonary embolism diagnosed May 2013 after surgery and prolonged inpatient stay with surgical wound.             ANTICOAG TODAY: Anticoagulation Summary as of 03/21/2012              INR goal 2.0-3.0     Selected INR 2.40 (03/21/2012) Next INR check 04/18/2012   Weekly max dose (mg)  Target end date 12/17/2012   Indications Pulmonary embolism, Encounter for long-term (current) use of anticoagulants    Anticoagulation Episode Summary              INR check location [facility]    Send INR reminders to    Comments Known provoking cause first episode of pulmonary embolism diagnosed May 2013 after surgery and prolonged inpatient stay with surgical wound.             PATIENT INSTRUCTIONS: Patient Instructions  Patient instructed to take medications as defined in the Anti-coagulation Track section of this encounter.  Patient instructed to take today's dose.  Patient verbalized understanding of these instructions.          FOLLOW-UP Return in 4 weeks (on 04/18/2012) for Follow up INR  at 0915.  Hulen Luster, III Pharm.D., CACP

## 2012-03-22 NOTE — Progress Notes (Signed)
Agree 

## 2012-04-06 ENCOUNTER — Ambulatory Visit (INDEPENDENT_AMBULATORY_CARE_PROVIDER_SITE_OTHER): Payer: Medicaid Other | Admitting: General Surgery

## 2012-04-06 ENCOUNTER — Encounter (INDEPENDENT_AMBULATORY_CARE_PROVIDER_SITE_OTHER): Payer: Self-pay | Admitting: General Surgery

## 2012-04-06 VITALS — BP 134/88 | HR 68 | Temp 98.2°F | Resp 18 | Ht 65.0 in | Wt 276.6 lb

## 2012-04-06 DIAGNOSIS — S31109A Unspecified open wound of abdominal wall, unspecified quadrant without penetration into peritoneal cavity, initial encounter: Secondary | ICD-10-CM

## 2012-04-06 NOTE — Patient Instructions (Signed)
Do the dressing changes twice a day.

## 2012-04-06 NOTE — Progress Notes (Signed)
Subjective:     Patient ID: Tina Chase, female   DOB: 05-Jul-1941, 71 y.o.   MRN: [id number]  HPI  She is here for another followup visit  of her open abdominal wound. She is doing her own dressing changes. These are normal saline damp to dry once a day.  Her energy level is back to normal.  She is having some drainage from the wound.   Review of Systems       Objective:   Physical Exam Gen.-morbidly obese female in no acute distress.  Abdomen-midline wound is significantly smaller and measures about 4 cm in width and 1 cm in depth with good granulation tissue.    Assessment:     Open abdominal wound healing in well by secondary intention.    Plan:     Increase dressing change frequency to BID.  RTC one month.

## 2012-04-07 ENCOUNTER — Ambulatory Visit (INDEPENDENT_AMBULATORY_CARE_PROVIDER_SITE_OTHER): Payer: PRIVATE HEALTH INSURANCE | Admitting: Internal Medicine

## 2012-04-07 ENCOUNTER — Other Ambulatory Visit: Payer: Self-pay | Admitting: *Deleted

## 2012-04-07 VITALS — BP 138/78 | HR 62 | Temp 97.0°F | Ht 60.0 in | Wt 279.3 lb

## 2012-04-07 DIAGNOSIS — I5033 Acute on chronic diastolic (congestive) heart failure: Secondary | ICD-10-CM

## 2012-04-07 DIAGNOSIS — S31109A Unspecified open wound of abdominal wall, unspecified quadrant without penetration into peritoneal cavity, initial encounter: Secondary | ICD-10-CM

## 2012-04-07 DIAGNOSIS — I10 Essential (primary) hypertension: Secondary | ICD-10-CM

## 2012-04-07 DIAGNOSIS — D649 Anemia, unspecified: Secondary | ICD-10-CM

## 2012-04-07 DIAGNOSIS — I509 Heart failure, unspecified: Secondary | ICD-10-CM

## 2012-04-07 LAB — CBC
MCHC: 32.7 g/dL (ref 30.0–36.0)
Platelets: 239 10*3/uL (ref 150–400)
RDW: 14.4 % (ref 11.5–15.5)

## 2012-04-07 LAB — BASIC METABOLIC PANEL
CO2: 30 mEq/L (ref 19–32)
Calcium: 9.9 mg/dL (ref 8.4–10.5)
Creat: 0.73 mg/dL (ref 0.50–1.10)
Glucose, Bld: 78 mg/dL (ref 70–99)

## 2012-04-07 MED ORDER — POTASSIUM CHLORIDE CRYS ER 20 MEQ PO TBCR: 40.0000 meq | EXTENDED_RELEASE_TABLET | Freq: Two times a day (BID) | ORAL | Status: DC

## 2012-04-07 MED ORDER — FUROSEMIDE 40 MG PO TABS: 40.0000 mg | ORAL_TABLET | Freq: Two times a day (BID) | ORAL | Status: DC

## 2012-04-07 MED ORDER — TAB-A-VITE/IRON PO TABS: 1.0000 | ORAL_TABLET | Freq: Every day | ORAL | Status: DC

## 2012-04-07 MED ORDER — ZINC SULFATE ER 220 (50 ZN) MG PO TBCR: 220.0000 mg | EXTENDED_RELEASE_TABLET | Freq: Every day | ORAL | Status: DC

## 2012-04-07 NOTE — Patient Instructions (Addendum)
1.  Stop in the lab to have your blood drawn.  If we need to follow up on anything I will call you.  2.  Continue your medications as prescribed.  3.  Pick up the Compression stockings.  Put them on in the morning as soon as you can to keep the fluid out of your legs.  4.  Follow up with [place] in about 1-2 months to meet Dr. Lonzo Cloud

## 2012-04-07 NOTE — Telephone Encounter (Signed)
Dr Tonny Branch, can u change qty to #120 since pt 2 tabs 2 times daily.  Thanks

## 2012-04-07 NOTE — Progress Notes (Signed)
Subjective:   Patient ID: Tina Chase female   DOB: [birth year] 71 y.o.   MRN: [id number]  HPI: Tina Chase is a 71 y.o. woman who presents to clinic today for follow up and refill of her medications.  She underwent surgery for a strangulated hernia in May of 2013 and since then has been dealing with a ventral wound that is left open to heal by secondary intention.  She is followed by [facility] and saw then yesterday.  She has been doing her dressing changes twice daily with wet to dry dressings.  She denies bleeding, odor, pain, or erythema around the wound.    She was recently diagnosed with diastolic heart failure and has been taking Lasix and potassium supplements.  She has not undergone a repeat Bmet recently.  She denies orthopnea, SOB, DOE, or chest pain.  She has had problems with swelling in her legs that is "pretty good" in the morning but gets worse as the day goes on.   During her stay in May for her surgery she was noted to be anemic with a HgB of 8.9 on discharge.  She has not had this rechecked.  She denies SOB, dizziness on standing, bleeding, abnormal bruising, or blood in her stool.   Past Medical History  Diagnosis Date  . Hyperlipidemia   . Hypertension   . Hyperparathyroidism   . Obesity   . Hyperglycemia   . Hypercalcemia   . Asthma   . Shortness of breath     wtih exertion   . Chronic kidney disease     right non functioning kidney   . Arthritis     left knee  and left shoulder    Current Outpatient Prescriptions  Medication Sig Dispense Refill  . amLODipine (NORVASC) 10 MG tablet Take 10 mg by mouth daily before lunch.       . ferrous fumarate (HEMOCYTE - 106 MG FE) 325 (106 FE) MG TABS Take 1 tablet by mouth daily.      . ferrous sulfate 325 (65 FE) MG tablet Take 1 tablet (325 mg total) by mouth 2 (two) times daily with a meal.  60 tablet  5  . furosemide (LASIX) 40 MG tablet Take 1 tablet (40 mg total) by mouth 2 (two) times daily.  60 tablet  0  .  metoprolol (LOPRESSOR) 50 MG tablet Take 1 tablet (50 mg total) by mouth 2 (two) times daily.  60 tablet  11  . Multiple Vitamins-Iron (MULTIVITAMINS WITH IRON) TABS Take 1 tablet by mouth daily.  30 tablet  5  . potassium chloride SA (K-DUR,KLOR-CON) 20 MEQ tablet Take 2 tablets (40 mEq total) by mouth 2 (two) times daily.  60 tablet  2  . PROAIR HFA 108 (90 BASE) MCG/ACT inhaler USE 2 PUFFS EVERY 4 TO 6 HOURS AS NEEDEDFOR WHEEZING  1 Inhaler  10  . vitamin C (ASCORBIC ACID) 500 MG tablet Take 1 tablet (500 mg total) by mouth daily.  30 tablet  5  . warfarin (COUMADIN) 5 MG tablet Using FIVE (5) MILLIGRAM strength warfarin tablets---take as directed by anticoagulation clinic provider.  50 tablet  2  . Zinc Sulfate 220 (50 ZN) MG TBCR Take 220 mg by mouth daily.       Family History  Problem Relation Age of Onset  . Stroke Neg Hx   . Cancer Neg Hx   . Nephrolithiasis Neg Hx   . Hyperlipidemia Mother   . Hypertension Mother   . Heart  disease Father     had a heart attack at age 9  . Heart attack Father 53  . Heart disease Brother     at age 38  . Heart attack Brother 67   History   Social History  . Marital Status: Single    Spouse Name: N/A    Number of Children: N/A  . Years of Education: N/A   Occupational History  . retired     Lawyer   Social History Main Topics  . Smoking status: Former Smoker    Types: Cigarettes    Quit date: 08/03/1968  . Smokeless tobacco: Never Used  . Alcohol Use: No  . Drug Use: No  . Sexually Active: Not Currently   Other Topics Concern  . Not on file   Social History Narrative  . No narrative on file   Review of Systems: Constitutional: Denies fever, chills, diaphoresis, appetite change and fatigue.  HEENT: Denies photophobia, eye pain, redness, hearing loss, ear pain, congestion, sore throat, rhinorrhea, sneezing, mouth sores, trouble swallowing, neck pain, neck stiffness and tinnitus.   Respiratory: Denies SOB, DOE, cough, chest  tightness,  and wheezing.   Cardiovascular: Denies chest pain, palpitations and leg swelling.  Gastrointestinal: Denies nausea, vomiting, abdominal pain, diarrhea, constipation, blood in stool and abdominal distention.  Genitourinary: Denies dysuria, urgency, frequency, hematuria, flank pain and difficulty urinating.  Musculoskeletal: Denies myalgias, back pain, joint swelling, arthralgias and gait problem.  Skin: Denies pallor, rash and wound.  Neurological: Denies dizziness, seizures, syncope, weakness, light-headedness, numbness and headaches.  Hematological: Denies adenopathy. Easy bruising, personal or family bleeding history  Psychiatric/Behavioral: Denies suicidal ideation, mood changes, confusion, nervousness, sleep disturbance and agitation  Objective:  Physical Exam: Filed Vitals:   04/07/12 1007  BP: 138/78  Pulse: 62  Temp: 97 F (36.1 C)  TempSrc: Oral  Height: 5' (1.524 m)  Weight: 279 lb 4.8 oz (126.69 kg)  SpO2: 96%   Constitutional: Vital signs reviewed.  Patient is a well-developed and well-nourished woman in no acute distress and cooperative with exam. Alert and oriented x3.  Head: Normocephalic and atraumatic Ear: TM normal bilaterally Mouth: no erythema or exudates, MMM Eyes: PERRL, EOMI, conjunctivae normal, No scleral icterus.  Neck: Supple, Trachea midline normal ROM, No JVD, mass, thyromegaly, or carotid bruit present.  Cardiovascular: RRR, S1 normal, S2 normal, no MRG, pulses symmetric and intact bilaterally Pulmonary/Chest: CTAB, no wheezes, rales, or rhonchi Abdominal: Midline open abdominal wound noted that is 4 cm long and about 1 cm wide.  Granulation tissue noted in the base with no exudate or odor.  Wound was redressed with wet to dry dressing.  Soft. Non-tender, non-distended, bowel sounds are normal, no masses, organomegaly, or guarding present.  GU: no CVA tenderness Musculoskeletal: 1+ pitting edema to the knee bilaterally with no skin changes  noted.  No joint deformities, erythema, or stiffness, ROM full and no nontender Hematology: no cervical, inginal, or axillary adenopathy.  Neurological: A&O x3, Strength is normal and symmetric bilaterally, cranial nerve II-XII are grossly intact, no focal motor deficit, sensory intact to light touch bilaterally.  Skin: Warm, dry and intact. No rash, cyanosis, or clubbing.  Psychiatric: Normal mood and affect. speech and behavior is normal. Judgment and thought content normal. Cognition and memory are normal.   Assessment & Plan:

## 2012-04-08 MED ORDER — POTASSIUM CHLORIDE CRYS ER 20 MEQ PO TBCR: 40.0000 meq | EXTENDED_RELEASE_TABLET | Freq: Two times a day (BID) | ORAL | Status: DC

## 2012-04-18 ENCOUNTER — Ambulatory Visit: Payer: PRIVATE HEALTH INSURANCE

## 2012-04-18 ENCOUNTER — Encounter: Payer: Self-pay | Admitting: Internal Medicine

## 2012-04-18 ENCOUNTER — Ambulatory Visit (INDEPENDENT_AMBULATORY_CARE_PROVIDER_SITE_OTHER): Payer: PRIVATE HEALTH INSURANCE | Admitting: Pharmacist

## 2012-04-18 DIAGNOSIS — Z7901 Long term (current) use of anticoagulants: Secondary | ICD-10-CM

## 2012-04-18 DIAGNOSIS — I2699 Other pulmonary embolism without acute cor pulmonale: Secondary | ICD-10-CM

## 2012-04-18 NOTE — Progress Notes (Signed)
Anti-Coagulation Progress Note  Frank Pilger is a 71 y.o. female who is currently on an anti-coagulation regimen.    RECENT RESULTS: Recent results are below, the most recent result is correlated with a dose of 37.5 mg. per week: Lab Results  Component Value Date   INR 3.20 04/18/2012   INR 2.40 03/21/2012   INR 2.50 02/22/2012    ANTI-COAG DOSE:   Latest dosing instructions   Total [name]   35 5 mg 5 mg 5 mg 5 mg 5 mg 5 mg 5 mg    (5 mg1) (5 mg1) (5 mg1) (5 mg1) (5 mg1) (5 mg1) (5 mg1)         ANTICOAG SUMMARY: Anticoagulation Episode Summary              Current INR goal 2.0-3.0 Next INR check 05/16/2012   INR from last check 3.20! (04/18/2012)     Weekly max dose (mg)  Target end date 12/17/2012   Indications Pulmonary embolism, Encounter for long-term (current) use of anticoagulants   [facility] check location [facility]    Send INR reminders to    Comments Known provoking cause first episode of pulmonary embolism diagnosed May 2013 after surgery and prolonged inpatient stay with surgical wound.             ANTICOAG TODAY: Anticoagulation Summary as of 04/18/2012              INR goal 2.0-3.0     Selected INR 3.20! (04/18/2012) Next INR check 05/16/2012   Weekly max dose (mg)  Target end date 12/17/2012   Indications Pulmonary embolism, Encounter for long-term (current) use of anticoagulants    Anticoagulation Episode Summary              [facility]R check location [facility]    Send INR reminders to    Comments Known provoking cause first episode of pulmonary embolism diagnosed May 2013 after surgery and prolonged inpatient stay with surgical wound.             PATIENT INSTRUCTIONS: Patient Instructions  Patient instructed to take medications as defined in the Anti-coagulation Track section of this encounter.  Patient instructed to take today's dose.  Patient verbalized understanding of these instructions.       FOLLOW-UP Return in 4 weeks (on 05/16/2012) for Follow up INR at 0945h.  Hulen Luster, III Pharm.D., CACP

## 2012-04-18 NOTE — Patient Instructions (Signed)
Patient instructed to take medications as defined in the Anti-coagulation Track section of this encounter.  Patient instructed to take today's dose.  Patient verbalized understanding of these instructions.    

## 2012-04-26 ENCOUNTER — Other Ambulatory Visit: Payer: Self-pay | Admitting: *Deleted

## 2012-04-26 MED ORDER — AMLODIPINE BESYLATE 10 MG PO TABS: 10.0000 mg | ORAL_TABLET | Freq: Every day | ORAL | Status: DC

## 2012-05-11 ENCOUNTER — Ambulatory Visit (INDEPENDENT_AMBULATORY_CARE_PROVIDER_SITE_OTHER): Payer: PRIVATE HEALTH INSURANCE | Admitting: General Surgery

## 2012-05-11 ENCOUNTER — Encounter (INDEPENDENT_AMBULATORY_CARE_PROVIDER_SITE_OTHER): Payer: Self-pay | Admitting: General Surgery

## 2012-05-11 ENCOUNTER — Other Ambulatory Visit: Payer: Self-pay | Admitting: *Deleted

## 2012-05-11 VITALS — BP 138/74 | HR 76 | Temp 97.6°F | Resp 18 | Ht 65.0 in | Wt 285.0 lb

## 2012-05-11 DIAGNOSIS — S31109A Unspecified open wound of abdominal wall, unspecified quadrant without penetration into peritoneal cavity, initial encounter: Secondary | ICD-10-CM

## 2012-05-11 DIAGNOSIS — I10 Essential (primary) hypertension: Secondary | ICD-10-CM

## 2012-05-11 DIAGNOSIS — E785 Hyperlipidemia, unspecified: Secondary | ICD-10-CM

## 2012-05-11 NOTE — Progress Notes (Signed)
Subjective:     Patient ID: Tina Chase, female   DOB: 1941-05-13, 71 y.o.   MRN: [id number]  HPI  She is here for a follow up visit for her open abdominal wound.  She states that the wound has healed and she is otherwise doing okay.   Review of Systems     Objective:   Physical Exam General-she looks well and is in no acute distress  Abd-soft, incision are healed and solid with no evidence of hernia    Assessment:     Open wound has healed.  No evidence of hernia.    Plan:     I recommended that she avoid repetitive lifting and that she try to lose weight.  Return prn.

## 2012-05-11 NOTE — Patient Instructions (Signed)
Avoid repetitive lifting.  Try to lose weight.

## 2012-05-12 MED ORDER — METOPROLOL TARTRATE 50 MG PO TABS: 50.0000 mg | ORAL_TABLET | Freq: Two times a day (BID) | ORAL | Status: DC

## 2012-05-16 ENCOUNTER — Ambulatory Visit (INDEPENDENT_AMBULATORY_CARE_PROVIDER_SITE_OTHER): Payer: PRIVATE HEALTH INSURANCE | Admitting: Pharmacist

## 2012-05-16 DIAGNOSIS — Z7901 Long term (current) use of anticoagulants: Secondary | ICD-10-CM

## 2012-05-16 DIAGNOSIS — I2699 Other pulmonary embolism without acute cor pulmonale: Secondary | ICD-10-CM

## 2012-05-16 NOTE — Progress Notes (Signed)
Anti-Coagulation Progress Note  Tina Chase is a 71 y.o. female who is currently on an anti-coagulation regimen.    RECENT RESULTS: Recent results are below, the most recent result is correlated with a dose of 35 mg. per week: Lab Results  Component Value Date   INR 2.80 05/16/2012   INR 3.20 04/18/2012   INR 2.40 03/21/2012    ANTI-COAG DOSE:   Latest dosing instructions   Total [name]   35 5 mg 5 mg 5 mg 5 mg 5 mg 5 mg 5 mg    (5 mg1) (5 mg1) (5 mg1) (5 mg1) (5 mg1) (5 mg1) (5 mg1)         ANTICOAG SUMMARY: Anticoagulation Episode Summary              Current INR goal 2.0-3.0 Next INR check 06/20/2012   INR from last check 2.80 (05/16/2012)     Weekly max dose (mg)  Target end date 12/17/2012   Indications Pulmonary embolism, Encounter for long-term (current) use of anticoagulants   [facility] check location [facility]    Send INR reminders to    Comments Known provoking cause first episode of pulmonary embolism diagnosed May 2013 after surgery and prolonged inpatient stay with surgical wound.             ANTICOAG TODAY: Anticoagulation Summary as of 05/16/2012              INR goal 2.0-3.0     Selected INR 2.80 (05/16/2012) Next INR check 06/20/2012   Weekly max dose (mg)  Target end date 12/17/2012   Indications Pulmonary embolism, Encounter for long-term (current) use of anticoagulants    Anticoagulation Episode Summary              [facility]R check location [facility]    Send INR reminders to    Comments Known provoking cause first episode of pulmonary embolism diagnosed May 2013 after surgery and prolonged inpatient stay with surgical wound.             PATIENT INSTRUCTIONS: Patient Instructions  Patient instructed to take medications as defined in the Anti-coagulation Track section of this encounter.  Patient instructed to take today's dose.  Patient verbalized understanding of these instructions.       FOLLOW-UP Return in 5 weeks (on 06/20/2012) for Follow up INR at 0945h.  Hulen Luster, III Pharm.D., CACP

## 2012-05-16 NOTE — Progress Notes (Signed)
Agree with Dr. Groce's Assessment/plan for this patient.  

## 2012-05-16 NOTE — Patient Instructions (Signed)
Patient instructed to take medications as defined in the Anti-coagulation Track section of this encounter.  Patient instructed to take today's dose.  Patient verbalized understanding of these instructions.    

## 2012-06-01 ENCOUNTER — Other Ambulatory Visit: Payer: Self-pay | Admitting: Internal Medicine

## 2012-06-01 DIAGNOSIS — Z1231 Encounter for screening mammogram for malignant neoplasm of breast: Secondary | ICD-10-CM

## 2012-06-03 ENCOUNTER — Other Ambulatory Visit: Payer: Self-pay | Admitting: *Deleted

## 2012-06-03 DIAGNOSIS — I5033 Acute on chronic diastolic (congestive) heart failure: Secondary | ICD-10-CM

## 2012-06-03 MED ORDER — FUROSEMIDE 40 MG PO TABS: 40.0000 mg | ORAL_TABLET | Freq: Two times a day (BID) | ORAL | Status: DC

## 2012-06-20 ENCOUNTER — Ambulatory Visit (INDEPENDENT_AMBULATORY_CARE_PROVIDER_SITE_OTHER): Payer: PRIVATE HEALTH INSURANCE | Admitting: Pharmacist

## 2012-06-20 DIAGNOSIS — Z7901 Long term (current) use of anticoagulants: Secondary | ICD-10-CM

## 2012-06-20 DIAGNOSIS — I2699 Other pulmonary embolism without acute cor pulmonale: Secondary | ICD-10-CM

## 2012-06-20 MED ORDER — WARFARIN SODIUM 5 MG PO TABS: ORAL_TABLET | ORAL | Status: DC

## 2012-06-20 NOTE — Progress Notes (Signed)
Anti-Coagulation Progress Note  Tina Chase is a 71 y.o. female who is currently on an anti-coagulation regimen.    RECENT RESULTS: Recent results are below, the most recent result is correlated with a dose of 35 mg. per week: Lab Results  Component Value Date   INR 3.0 06/20/2012   INR 2.80 05/16/2012   INR 3.20 04/18/2012    ANTI-COAG DOSE:   Latest dosing instructions   Total [name]   35 5 mg 5 mg 5 mg 5 mg 5 mg 5 mg 5 mg    (5 mg1) (5 mg1) (5 mg1) (5 mg1) (5 mg1) (5 mg1) (5 mg1)         ANTICOAG SUMMARY: Anticoagulation Episode Summary              Current INR goal 2.0-3.0 Next INR check 07/18/2012   INR from last check 3.0 (06/20/2012)     Weekly max dose (mg)  Target end date 12/17/2012   Indications Pulmonary embolism [415.19], Encounter for long-term (current) use of anticoagulants [V58.61]   [facility] check location [facility]    Send INR reminders to    Comments Known provoking cause first episode of pulmonary embolism diagnosed May 2013 after surgery and prolonged inpatient stay with surgical wound.             ANTICOAG TODAY: Anticoagulation Summary as of 06/20/2012              INR goal 2.0-3.0     Selected INR 3.0 (06/20/2012) Next INR check 07/18/2012   Weekly max dose (mg)  Target end date 12/17/2012   Indications Pulmonary embolism [415.19], Encounter for long-term (current) use of anticoagulants [V58.61]    Anticoagulation Episode Summary              [facility] check location [facility]    Send INR reminders to    Comments Known provoking cause first episode of pulmonary embolism diagnosed May 2013 after surgery and prolonged inpatient stay with surgical wound.             PATIENT INSTRUCTIONS: Patient Instructions  Patient instructed to take medications as defined in the Anti-coagulation Track section of this encounter.  Patient instructed to take today's dose.  Patient verbalized  understanding of these instructions.        FOLLOW-UP Return in 4 weeks (on 07/18/2012) for Follow up INR at 1000h.  Hulen Luster, III Pharm.D., CACP

## 2012-06-20 NOTE — Patient Instructions (Signed)
Patient instructed to take medications as defined in the Anti-coagulation Track section of this encounter.  Patient instructed to take today's dose.  Patient verbalized understanding of these instructions.    

## 2012-07-07 NOTE — Assessment & Plan Note (Signed)
CBC:    Component Value Date/Time   WBC 7.8 04/07/2012 1041   HGB 12.9 04/07/2012 1041   HCT 39.5 04/07/2012 1041   PLT 239 04/07/2012 1041   MCV 88.8 04/07/2012 1041   NEUTROABS 6.8 12/13/2011 0440   LYMPHSABS 2.6 12/13/2011 0440   MONOABS 1.6* 12/13/2011 0440   EOSABS 0.4 12/13/2011 0440   BASOSABS 0.0 12/13/2011 0440   Her hemoglobin today is 12.9 which is well recovered from her last check when she was in the hospital for the incarcerated hernia.  She was encouraged to eat a well balanced diet.

## 2012-07-07 NOTE — Assessment & Plan Note (Signed)
Her wound is healing well and she continues to do her dressing changes and follows with the [facility].  We discussed warning signs including fever, chills, odor, drainage from the wound, or redness around the wound as when to seek help.

## 2012-07-07 NOTE — Assessment & Plan Note (Addendum)
BP Readings from Last 6 Encounters:  04/07/12 138/78  04/06/12 134/88  03/07/12 132/84  02/08/12 134/84  01/14/12 130/82   Her blood pressure is well controlled her on her current medications.  We will continue them for now and continue to monitor.  With her diastolic heart failure she would likely benefit from an ACEI if we are able to get that started in the future.

## 2012-07-07 NOTE — Assessment & Plan Note (Signed)
Her last echo in 2013 showed Grade 1 diastolic dysfunction with preserved EF.  She has no other signs of an acute heart failure exacerbation at this time.   Basic Metabolic Panel:    Component Value Date/Time   NA 141 04/07/2012 1041   K 3.9 04/07/2012 1041   CL 103 04/07/2012 1041   CO2 30 04/07/2012 1041   BUN 11 04/07/2012 1041   CREATININE 0.73 04/07/2012 1041   CREATININE 0.87 12/22/2011 0500   GLUCOSE 78 04/07/2012 1041   CALCIUM 9.9 04/07/2012 1041   CALCIUM 10.5 06/10/2009 1342   Her Bmet today was completely normal so I will refill her Lasix and potassium supplements today.  She was encouraged to keep her legs elevated when she is seated and when she is up and moving to use compression stockings which we will prescribe today for her.

## 2012-07-08 ENCOUNTER — Ambulatory Visit
Admission: RE | Admit: 2012-07-08 | Discharge: 2012-07-08 | Disposition: A | Discharge status: Home / Self Care | Payer: PRIVATE HEALTH INSURANCE | Source: Ambulatory Visit | Attending: Internal Medicine | Admitting: Internal Medicine

## 2012-07-08 ENCOUNTER — Other Ambulatory Visit: Payer: Self-pay | Admitting: Internal Medicine

## 2012-07-08 ENCOUNTER — Other Ambulatory Visit: Payer: Self-pay | Admitting: *Deleted

## 2012-07-08 DIAGNOSIS — I5033 Acute on chronic diastolic (congestive) heart failure: Secondary | ICD-10-CM

## 2012-07-08 DIAGNOSIS — Z1231 Encounter for screening mammogram for malignant neoplasm of breast: Secondary | ICD-10-CM

## 2012-07-15 MED ORDER — FUROSEMIDE 40 MG PO TABS: 40.0000 mg | ORAL_TABLET | Freq: Two times a day (BID) | ORAL | Status: DC

## 2012-07-18 ENCOUNTER — Ambulatory Visit (INDEPENDENT_AMBULATORY_CARE_PROVIDER_SITE_OTHER): Payer: PRIVATE HEALTH INSURANCE | Admitting: Pharmacist

## 2012-07-18 ENCOUNTER — Encounter: Payer: Self-pay | Admitting: Radiation Oncology

## 2012-07-18 DIAGNOSIS — I2699 Other pulmonary embolism without acute cor pulmonale: Secondary | ICD-10-CM

## 2012-07-18 DIAGNOSIS — Z7901 Long term (current) use of anticoagulants: Secondary | ICD-10-CM

## 2012-07-18 LAB — POCT INR: INR: 2.5

## 2012-07-18 NOTE — Progress Notes (Signed)
Anti-Coagulation Progress Note  Tina Chase is a 72 y.o. female who is currently on an anti-coagulation regimen.    RECENT RESULTS: Recent results are below, the most recent result is correlated with a dose of 35 mg. per week: Lab Results  Component Value Date   INR 2.50 07/18/2012   INR 3.0 06/20/2012   INR 2.80 05/16/2012    ANTI-COAG DOSE:   Latest dosing instructions   Total [name]   35 5 mg 5 mg 5 mg 5 mg 5 mg 5 mg 5 mg    (5 mg1) (5 mg1) (5 mg1) (5 mg1) (5 mg1) (5 mg1) (5 mg1)         ANTICOAG SUMMARY: Anticoagulation Episode Summary              Current INR goal 2.0-3.0 Next INR check 08/15/2012   INR from last check 2.50 (07/18/2012)     Weekly max dose (mg)  Target end date 12/17/2012   Indications Pulmonary embolism [415.19], Encounter for long-term (current) use of anticoagulants [V58.61]   INR check location [facility] Preferred lab    Send INR reminders to    Comments Known provoking cause first episode of pulmonary embolism diagnosed May 2013 after surgery and prolonged inpatient stay with surgical wound.             ANTICOAG TODAY: Anticoagulation Summary as of 07/18/2012              INR goal 2.0-3.0     Selected INR 2.50 (07/18/2012) Next INR check 08/15/2012   Weekly max dose (mg)  Target end date 12/17/2012   Indications Pulmonary embolism [415.19], Encounter for long-term (current) use of anticoagulants [V58.61]    Anticoagulation Episode Summary              INR check location [facility] Preferred lab    Send INR reminders to    Comments Known provoking cause first episode of pulmonary embolism diagnosed May 2013 after surgery and prolonged inpatient stay with surgical wound.             PATIENT INSTRUCTIONS: Patient Instructions  Patient instructed to take medications as defined in the Anti-coagulation Track section of this encounter.  Patient instructed to take today's dose.  Patient verbalized  understanding of these instructions.        FOLLOW-UP Return in 4 weeks (on 08/15/2012) for Follow up INR at 0945h.  Hulen Luster, III Pharm.D., CACP

## 2012-07-18 NOTE — Patient Instructions (Signed)
Patient instructed to take medications as defined in the Anti-coagulation Track section of this encounter.  Patient instructed to take today's dose.  Patient verbalized understanding of these instructions.    

## 2012-07-21 ENCOUNTER — Ambulatory Visit (INDEPENDENT_AMBULATORY_CARE_PROVIDER_SITE_OTHER): Payer: PRIVATE HEALTH INSURANCE | Admitting: Radiation Oncology

## 2012-07-21 ENCOUNTER — Ambulatory Visit: Payer: PRIVATE HEALTH INSURANCE | Admitting: Internal Medicine

## 2012-07-21 ENCOUNTER — Encounter: Payer: Self-pay | Admitting: Radiation Oncology

## 2012-07-21 VITALS — BP 157/73 | HR 59 | Temp 98.2°F | Resp 20 | Ht 62.5 in | Wt 293.0 lb

## 2012-07-21 DIAGNOSIS — D649 Anemia, unspecified: Secondary | ICD-10-CM

## 2012-07-21 DIAGNOSIS — Z8719 Personal history of other diseases of the digestive system: Secondary | ICD-10-CM

## 2012-07-21 DIAGNOSIS — E785 Hyperlipidemia, unspecified: Secondary | ICD-10-CM

## 2012-07-21 DIAGNOSIS — I2699 Other pulmonary embolism without acute cor pulmonale: Secondary | ICD-10-CM

## 2012-07-21 DIAGNOSIS — J45909 Unspecified asthma, uncomplicated: Secondary | ICD-10-CM

## 2012-07-21 DIAGNOSIS — Z9889 Other specified postprocedural states: Secondary | ICD-10-CM

## 2012-07-21 DIAGNOSIS — Z0000 Encounter for general adult medical examination without abnormal findings: Secondary | ICD-10-CM

## 2012-07-21 DIAGNOSIS — I10 Essential (primary) hypertension: Secondary | ICD-10-CM

## 2012-07-21 DIAGNOSIS — Z23 Encounter for immunization: Secondary | ICD-10-CM

## 2012-07-21 LAB — LIPID PANEL
HDL: 46 mg/dL (ref >39)
LDL Cholesterol: 112 mg/dL — ABNORMAL HIGH (ref 0–99)
Triglycerides: 120 mg/dL (ref <150)
VLDL: 24 mg/dL (ref 0–40)

## 2012-07-21 LAB — BASIC METABOLIC PANEL
BUN: 11 mg/dL (ref 6–23)
Calcium: 9.4 mg/dL (ref 8.4–10.5)
Glucose, Bld: 79 mg/dL (ref 70–99)
Potassium: 4.3 mEq/L (ref 3.5–5.3)

## 2012-07-21 LAB — CBC
HCT: 39.5 % (ref 36.0–46.0)
Hemoglobin: 12.9 g/dL (ref 12.0–15.0)
MCH: 30 pg (ref 26.0–34.0)
MCHC: 32.7 g/dL (ref 30.0–36.0)
RDW: 13.9 % (ref 11.5–15.5)

## 2012-07-21 LAB — IRON: Iron: 75 ug/dL (ref 42–145)

## 2012-07-21 LAB — FERRITIN: Ferritin: 773 ng/mL — ABNORMAL HIGH (ref 10–291)

## 2012-07-21 NOTE — Assessment & Plan Note (Signed)
Pt appears to have been started on ferrous sulfate post-operatively in 12/2011. Her Hb was wnl at last check in 04/2012. Will recheck CBC and iron panel today, and if wnl, will d/c ferrous sulfate.  - CBC - iron studies - d/c PO ferrous sulfate (unless studies are abnormal)

## 2012-07-21 NOTE — Assessment & Plan Note (Signed)
Patient was noted to have PE during a prolonged hospital stay in 12/2011 following presenting with SBO 2/2 incarcerated hernia which was surgically repaired. Given patient's PE was provoked, she has no further need for anticoagulation at this time, as she has already been treated for >7 months. She has no signs/symptoms today consistent with either DVT or PE. She was informed of the risks/benefits of d/c'ing warfarin at this time, and states that she wishes to d/c the medication.  - d/c warfarin

## 2012-07-21 NOTE — Progress Notes (Signed)
Patient: Tina Chase   MRN: [id number]  DOB: 08-28-1940  PCP: Elfredia Nevins, MD   Subjective:    HPI: Ms. Tina Chase is a 71 y.o. female with a PMHx of ventral hernia s/p repair in 12/2011, pulmonary embolism, grade 1 diastolic CHF, hypertension, who presents to clinic today for routine followup. Patient states she's been feeling well since her previous visit 04/2012, and her only complaints today are of occasional diarrhea and also of complaints of intermittent abdominal swelling. The patient states that she believes occasionally when she is standing she notices a mild degree of "swelling" of her right abdomen. She denies any associated pain, or any reducible masses, and states that the problem resolves by itself without intervention.  The patient states she was started on many new medications following her 12/2011 hospitalization, and requested some of these be discontinued today if possible. She also has been on warfarin since 12/2011 for a provoked DVT/PE, and states she's had no recurrent symptoms insistent with DVT/PE since that time. Denies unilateral leg swelling or any leg pain. Denies shortness of breath or chest pain.    Review of Systems: Per HPI.   Current Outpatient Medications: Current Outpatient Prescriptions  Medication Sig Dispense Refill  . amLODipine (NORVASC) 10 MG tablet Take 1 tablet (10 mg total) by mouth daily before lunch.  30 tablet  3  . ferrous fumarate (HEMOCYTE - 106 MG FE) 325 (106 FE) MG TABS Take 1 tablet by mouth daily.      . ferrous sulfate 325 (65 FE) MG tablet Take 1 tablet (325 mg total) by mouth 2 (two) times daily with a meal.  60 tablet  5  . furosemide (LASIX) 40 MG tablet Take 1 tablet (40 mg total) by mouth 2 (two) times daily.  60 tablet  1  . furosemide (LASIX) 40 MG tablet TAKE ONE TABLET TWICE DAILY  60 tablet  0  . metoprolol (LOPRESSOR) 50 MG tablet Take 1 tablet (50 mg total) by mouth 2 (two) times daily.  180 tablet  3  . Multiple  Vitamins-Iron (MULTIVITAMINS WITH IRON) TABS Take 1 tablet by mouth daily.  30 tablet  5  . potassium chloride SA (K-DUR,KLOR-CON) 20 MEQ tablet Take 2 tablets (40 mEq total) by mouth 2 (two) times daily.  120 tablet  2  . PROAIR HFA 108 (90 BASE) MCG/ACT inhaler USE 2 PUFFS EVERY 4 TO 6 HOURS AS NEEDEDFOR WHEEZING  1 Inhaler  10  . vitamin C (ASCORBIC ACID) 500 MG tablet Take 1 tablet (500 mg total) by mouth daily.  30 tablet  5  . warfarin (COUMADIN) 5 MG tablet Using FIVE (5) MILLIGRAM strength warfarin tablets---take as directed by anticoagulation clinic provider.  50 tablet  2  . Zinc Sulfate 220 (50 ZN) MG TBCR Take 1 tablet (220 mg total) by mouth daily.  30 tablet  2     Allergies  Allergen Reactions  . Hydralazine Anaphylaxis    Swelling tongue.  . Aspirin     REACTION: hives  . Crab (Shellfish Allergy) Hives  . Lisinopril     REACTION: angioedema    Past Medical History  Diagnosis Date  . Hyperlipidemia   . Hypertension   . Hyperparathyroidism   . Obesity   . Hyperglycemia   . Hypercalcemia   . Asthma   . Shortness of breath     wtih exertion   . Chronic kidney disease     right non functioning kidney   .  Arthritis     left knee  and left shoulder     Past Surgical History  Procedure Date  . Tubal ligation   . Other surgical history     D and C x 3   . Laparoscopic nephrectomy 08/19/2011    Procedure: LAPAROSCOPIC NEPHRECTOMY;  Surgeon: Milford Cage, MD;  Location: [facility];  Service: Urology;  Laterality: Right;  . Laparotomy 12/04/2011    Procedure: EXPLORATORY LAPAROTOMY;  Surgeon: Adolph Pollack, MD;  Location: [facility];  Service: General;  Laterality: N/A;   bowel resection   . Incisional hernia repair 12/04/2011    Procedure: HERNIA REPAIR INCISIONAL;  Surgeon: Adolph Pollack, MD;  Location: [facility];  Service: General;  Laterality: N/A;  . Application of wound vac 12/04/2011    Procedure: APPLICATION OF WOUND VAC;  Surgeon: Adolph Pollack, MD;   Location: [facility];  Service: General;;  x two  . Kidney cyst removal      Objective:    Physical Exam: Filed Vitals:   07/21/12 0919  BP: 155/78  Pulse: 71  Temp: 98.2 F (36.8 C)  Resp: 20      General: Vital signs reviewed and noted. Well-developed, well-nourished, in no acute distress; alert, appropriate and cooperative throughout examination.  Head: Normocephalic, atraumatic.  Lungs:  Normal respiratory effort. Clear to auscultation BL without crackles or wheezes.  Heart: RRR. S1 and S2 normal without gallop, rubs. no murmur.  Abdomen:  BS normoactive. Soft, Nondistended, non-tender.  No masses or organomegaly. No hernias. Midline surgical scar completely healed.  Extremities: No pretibial edema. No tenderness to palpation in BLE.     Assessment/ Plan:

## 2012-07-21 NOTE — Assessment & Plan Note (Signed)
Previous LDL in 09/2011 at goal. Will recheck lipid panel today as patient states she discontinued her statin at an undisclosed time.

## 2012-07-21 NOTE — Patient Instructions (Addendum)

## 2012-07-21 NOTE — Assessment & Plan Note (Signed)
Stable. Pt states she has not needed her albuterol inhaler in months.

## 2012-07-21 NOTE — Assessment & Plan Note (Signed)
Given flu vaccine today  

## 2012-07-21 NOTE — Assessment & Plan Note (Signed)
BP Readings from Last 3 Encounters:  07/21/12 157/73  05/11/12 138/74  04/07/12 138/78    Lab Results  Component Value Date   NA 141 04/07/2012   K 3.9 04/07/2012   CREATININE 0.73 04/07/2012    Assessment:  Slightly elevated today, however goal for this patient is <150/90 given age >48.  Plan:  Medications:  continue current medications  Educational resources provided:    Self management tools provided: home blood pressure logbook  Other plans: will reassess in 6 months, as patient states she has been having good control per her home BP monitor.

## 2012-07-21 NOTE — Assessment & Plan Note (Addendum)
Abdominal exam was within normal limits with well-healed scars from her previous surgery and with no masses or hernias identified. The patient was instructed to contact us if her complaints did not improve, or if she developed pain (or new/worsened symptoms), which would necessitate re-evaluation (and referral to surgery if pt indeed had developed a new ventral hernia).

## 2012-08-08 ENCOUNTER — Other Ambulatory Visit: Payer: Self-pay | Admitting: *Deleted

## 2012-08-08 DIAGNOSIS — I5033 Acute on chronic diastolic (congestive) heart failure: Secondary | ICD-10-CM

## 2012-08-08 MED ORDER — POTASSIUM CHLORIDE CRYS ER 20 MEQ PO TBCR: 40.0000 meq | EXTENDED_RELEASE_TABLET | Freq: Two times a day (BID) | ORAL | Status: DC

## 2012-08-08 MED ORDER — FUROSEMIDE 40 MG PO TABS: 40.0000 mg | ORAL_TABLET | Freq: Two times a day (BID) | ORAL | Status: DC

## 2012-08-15 ENCOUNTER — Ambulatory Visit: Payer: PRIVATE HEALTH INSURANCE

## 2012-09-14 ENCOUNTER — Other Ambulatory Visit: Payer: Self-pay | Admitting: *Deleted

## 2012-09-14 DIAGNOSIS — I10 Essential (primary) hypertension: Secondary | ICD-10-CM

## 2012-09-14 DIAGNOSIS — E785 Hyperlipidemia, unspecified: Secondary | ICD-10-CM

## 2012-09-14 MED ORDER — AMLODIPINE BESYLATE 10 MG PO TABS: 10.0000 mg | ORAL_TABLET | Freq: Every day | ORAL | Status: DC

## 2012-09-14 MED ORDER — METOPROLOL TARTRATE 50 MG PO TABS: 50.0000 mg | ORAL_TABLET | Freq: Two times a day (BID) | ORAL | Status: DC

## 2012-09-14 NOTE — Addendum Note (Signed)
Addended by: Lars Mage on: 09/14/2012 12:01 PM   Modules accepted: Orders

## 2012-09-14 NOTE — Telephone Encounter (Signed)
Talked with pharmacy about repeat Rx.

## 2012-10-31 ENCOUNTER — Other Ambulatory Visit: Payer: Self-pay | Admitting: *Deleted

## 2012-10-31 DIAGNOSIS — I5033 Acute on chronic diastolic (congestive) heart failure: Secondary | ICD-10-CM

## 2012-10-31 MED ORDER — FUROSEMIDE 40 MG PO TABS: 40.0000 mg | ORAL_TABLET | Freq: Two times a day (BID) | ORAL | Status: DC

## 2012-12-06 ENCOUNTER — Encounter: Payer: PRIVATE HEALTH INSURANCE | Admitting: Radiation Oncology

## 2012-12-06 ENCOUNTER — Ambulatory Visit (INDEPENDENT_AMBULATORY_CARE_PROVIDER_SITE_OTHER): Payer: PRIVATE HEALTH INSURANCE | Admitting: Internal Medicine

## 2012-12-06 ENCOUNTER — Ambulatory Visit (HOSPITAL_COMMUNITY)
Admission: RE | Admit: 2012-12-06 | Discharge: 2012-12-06 | Disposition: A | Discharge status: Home / Self Care | Payer: PRIVATE HEALTH INSURANCE | Source: Ambulatory Visit | Attending: Internal Medicine | Admitting: Internal Medicine

## 2012-12-06 ENCOUNTER — Encounter: Payer: Self-pay | Admitting: Internal Medicine

## 2012-12-06 VITALS — BP 162/90 | HR 70 | Temp 97.1°F | Ht 62.5 in | Wt 306.7 lb

## 2012-12-06 DIAGNOSIS — H6123 Impacted cerumen, bilateral: Secondary | ICD-10-CM

## 2012-12-06 DIAGNOSIS — E785 Hyperlipidemia, unspecified: Secondary | ICD-10-CM

## 2012-12-06 DIAGNOSIS — M542 Cervicalgia: Secondary | ICD-10-CM

## 2012-12-06 DIAGNOSIS — M47812 Spondylosis without myelopathy or radiculopathy, cervical region: Secondary | ICD-10-CM | POA: Insufficient documentation

## 2012-12-06 DIAGNOSIS — Z0000 Encounter for general adult medical examination without abnormal findings: Secondary | ICD-10-CM

## 2012-12-06 DIAGNOSIS — H6120 Impacted cerumen, unspecified ear: Secondary | ICD-10-CM

## 2012-12-06 DIAGNOSIS — I10 Essential (primary) hypertension: Secondary | ICD-10-CM

## 2012-12-06 DIAGNOSIS — B373 Candidiasis of vulva and vagina: Secondary | ICD-10-CM

## 2012-12-06 DIAGNOSIS — R300 Dysuria: Secondary | ICD-10-CM

## 2012-12-06 LAB — POCT URINALYSIS DIPSTICK
Glucose, UA: NEGATIVE
Nitrite, UA: NEGATIVE
Protein, UA: 100
Urobilinogen, UA: 0.2
pH, UA: 5

## 2012-12-06 LAB — BASIC METABOLIC PANEL WITH GFR
BUN: 10 mg/dL (ref 6–23)
CO2: 31 mEq/L (ref 19–32)
Chloride: 101 mEq/L (ref 96–112)
GFR, Est African American: >89 mL/min
Glucose, Bld: 93 mg/dL (ref 70–99)
Potassium: 3.7 mEq/L (ref 3.5–5.3)
Sodium: 141 mEq/L (ref 135–145)

## 2012-12-06 MED ORDER — CARBAMIDE PEROXIDE 6.5 % OT SOLN: 5.0000 [drp] | Freq: Two times a day (BID) | OTIC | Status: AC

## 2012-12-06 MED ORDER — FLUCONAZOLE 150 MG PO TABS: 150.0000 mg | ORAL_TABLET | Freq: Once | ORAL | Status: DC

## 2012-12-06 MED ORDER — PRAVASTATIN SODIUM 20 MG PO TABS: 20.0000 mg | ORAL_TABLET | Freq: Every evening | ORAL | Status: DC

## 2012-12-06 NOTE — Progress Notes (Signed)
Subjective:     Patient ID: Tina Chase, female   DOB: Jun 17, 1941, 72 y.o.   MRN: [id number]  HPI Tina Chase is a 72 year old african [place] female who follows up with the clinic for hypertension, hyperlipidemia, diastolic heart failure, history of pulmonary embolism, and nephrectomy. I am seeing her for the first time.   Old problems:   She is complaint with her hypertension medications (Amlodipine, lasix, and metoprolol). She denies any chest pain, palpitations, visual problems, worsening headaches or dizziness this visit. She stopped taking statin for her hyperlipidemia a long time ago and she wants to know her "good and bad cholesterol levels"  New complaints:   1. She complains of burning and itching during urination and does not know if she has any discharge. She denies any feeling of incomplete emptying or urgency. She admits to having frequency "I have stopped counting the number of times", however, to note that she is on lasix as well. She denies any fever or chills, or flank pain. She is not sexually active since a many years.   2. She also complains of pain on the left side of her neck for the past 3 months. It is intermittent, 8-9/10, feels like a spasm or cramp, comes on if she turns her neck to the far left or chews on food on the left side. The pain is relieved in a few minutes by turning her neck to the right side. She attributes it to the central venous catheter that she had in her neck when she was in ICU in April 2013. She denies any neck trauma or any sudden neck movements. She denies any radicular type of symptoms. She is helped by tylenol in her pain. She is getting scared to drive, because she fears that the pain will come when she looks to the left to cross lanes. She denies neck stiffness in the mornings, or at any other time. She admits to having stuffy ears.  Review of Systems  Constitutional: Negative for fever, chills and fatigue.  HENT: Positive for neck pain. Negative  for hearing loss, ear pain, nosebleeds, sore throat, mouth sores, trouble swallowing, neck stiffness, dental problem, voice change, sinus pressure, tinnitus and ear discharge.   Eyes: Negative.  Negative for visual disturbance.  Respiratory: Negative for cough, choking, chest tightness, shortness of breath and stridor.   Cardiovascular: Negative for chest pain, palpitations and leg swelling.  Gastrointestinal: Negative.   Genitourinary: Positive for dysuria, frequency and difficulty urinating. Negative for urgency, hematuria, flank pain, decreased urine volume, vaginal bleeding, vaginal pain and pelvic pain. Vaginal discharge: does not know.  Musculoskeletal: Negative for myalgias.  Skin: Negative.   Neurological: Negative for dizziness, syncope, speech difficulty, weakness, light-headedness, numbness and headaches.  Psychiatric/Behavioral: Negative.        Objective:   Physical Exam  Constitutional: She is oriented to person, place, and time. She appears well-developed and well-nourished.  HENT:  Nose: Nose normal.  Mouth/Throat: Oropharynx is clear and moist.  Impacted wax in both ears (left > right)  Eyes: Conjunctivae are normal. Pupils are equal, round, and reactive to light.  Neck: Normal range of motion and full passive range of motion without pain. Neck supple. Normal carotid pulses and no JVD present. No tracheal tenderness, no spinous process tenderness and no muscular tenderness present. Carotid bruit is not present. No rigidity. No tracheal deviation, no edema, no erythema and normal range of motion present.    Cardiovascular: Normal rate and regular rhythm.  Murmur (systolic) heard. Pulmonary/Chest: Effort normal and breath sounds normal. No stridor.  Abdominal: Soft.  Genitourinary:  White discharge in vagina. Mild erythema on vaginal walls. No pain elicited during exam.  Neurological: She is alert and oriented to person, place, and time.  Skin: Skin is warm.    Psychiatric: She has a normal mood and affect. Her behavior is normal.       Assessment & Plan:     1. Neck Pain - likely muscular origin, cause unclear. Will proceed with soft tissue neck x ray. The patient will let me know if she needs something more than tylenol. We will evaluate this neck pain further if Xray does not show anything  2. Impacted ear wax- Debrox ear drops prescribed.  Patient will schedule a visit in a week to get softened wax removed.   3. Dysuria - Likely vaginal candidiasis with or without UTI. Fluconazole 1 time prescribed. Urine dipstick shows some leukocytes, no nitrite. I will wait for the reflex microscopic UA to prescribe antibiotic for UTI.   4. Hypertension - uncontrolled this visit.  BP Readings from Last 3 Encounters:  12/06/12 162/90  07/21/12 157/73  05/11/12 138/74   Patient said that she did not take her pill. BMP for electrolytes ordered this visit. No change in medications today.    5. Dyslipidemia - Agreed to start Pravastatin 20 mg today.  Lipid Panel     Component Value Date/Time   CHOL 182 07/21/2012 1036   TRIG 120 07/21/2012 1036   HDL 46 07/21/2012 1036   CHOLHDL 4.0 07/21/2012 1036   VLDL 24 07/21/2012 1036   LDLCALC 112* 07/21/2012 1036   6. Preventive - Patient had colonoscopy in 2010 and we do not have results. I will get records.     NEXT VISIT in 3 months (barring the ear wax removal visit) - Readdress HTN and reassess need for additional medication - Neck pain evaluation.

## 2012-12-06 NOTE — Patient Instructions (Addendum)
Dear Tina Chase,  We met today and discussed about your neck pain and burning during urination.   Please take the new medicine FLUCONAZOLE 1 tablet only 1 time, for the YEAST INFECTION.   Please get the NECK X RAY done.   We have done blood work this visit. Whenever we do blood work, we will call you with abnormal results.   Please schedule next visit in 3 months or as needed.  Thanks, and take care,  Dr. [name] MD MPH Clinical Assistant Professor [facility]  12/06/2012 11:35 AM          Candidal Vulvovaginitis Candidal vulvovaginitis is an infection of the vagina and vulva. The vulva is the skin around the opening of the vagina. This may cause itching and discomfort in and around the vagina.  HOME CARE  Only take medicine as told by your doctor.  Do not have sex (intercourse) until the infection is healed or as told by your doctor.  Practice safe sex.  Tell your sex partner about your infection.  Do not douche or use tampons.  Wear cotton underwear. Do not wear tight pants or panty hose.  Eat yogurt. This may help treat and prevent yeast infections. GET HELP RIGHT AWAY IF:   You have a fever.  Your problems get worse during treatment or do not get better in 3 days.  You have discomfort, irritation, or itching in your vagina or vulva area.  You have pain after sex.  You start to get belly (abdominal) pain. MAKE SURE YOU:  Understand these instructions.  Will watch your condition.  Will get help right away if you are not doing well or get worse. Document Released: 10/16/2008 Document Revised: 10/12/2011 Document Reviewed: 10/16/2008 [facility] Patient Information 2013 [place], [place].

## 2012-12-07 LAB — URINALYSIS, MICROSCOPIC ONLY: Crystals: NONE SEEN

## 2012-12-07 LAB — URINALYSIS, ROUTINE W REFLEX MICROSCOPIC
Bilirubin Urine: NEGATIVE
Glucose, UA: NEGATIVE mg/dL
Ketones, ur: NEGATIVE mg/dL
Specific Gravity, Urine: 1.017 (ref 1.005–1.030)
Urobilinogen, UA: 0.2 mg/dL (ref 0.0–1.0)
pH: 5.5 (ref 5.0–8.0)

## 2012-12-07 NOTE — Progress Notes (Signed)
Patient ID: Tina Chase, female   DOB: [birth year], 72 y.o.   MRN: [id number]  Called patient to discuss results from her Neck Xray and urine tests. I told her that her X ray revealed mild cervical spondylosis and the pain could be a component of that. She says that she is not having any significant neck pain since yesterday. She understood that if it gets worse she will call the clinic. Her urine does not show any significant WBC or nitrite. She has not yet taken her fluconazole for her vaginal candida infection.  If she does not get better after taking fluconazole, we will reassess.

## 2012-12-08 LAB — URINE CULTURE: Organism ID, Bacteria: NO GROWTH

## 2013-02-26 ENCOUNTER — Other Ambulatory Visit: Payer: Self-pay | Admitting: Internal Medicine

## 2013-02-27 NOTE — Telephone Encounter (Signed)
Medication refill

## 2013-03-23 ENCOUNTER — Ambulatory Visit (INDEPENDENT_AMBULATORY_CARE_PROVIDER_SITE_OTHER): Payer: PRIVATE HEALTH INSURANCE | Admitting: Internal Medicine

## 2013-03-23 ENCOUNTER — Other Ambulatory Visit (HOSPITAL_COMMUNITY)
Admission: RE | Admit: 2013-03-23 | Discharge: 2013-03-23 | Disposition: A | Discharge status: Home / Self Care | Payer: PRIVATE HEALTH INSURANCE | Source: Ambulatory Visit | Attending: Internal Medicine | Admitting: Internal Medicine

## 2013-03-23 ENCOUNTER — Encounter: Payer: Self-pay | Admitting: Internal Medicine

## 2013-03-23 VITALS — BP 152/86 | HR 73 | Temp 97.6°F | Ht 62.75 in | Wt 308.1 lb

## 2013-03-23 DIAGNOSIS — N760 Acute vaginitis: Secondary | ICD-10-CM

## 2013-03-23 DIAGNOSIS — L293 Anogenital pruritus, unspecified: Secondary | ICD-10-CM

## 2013-03-23 DIAGNOSIS — A499 Bacterial infection, unspecified: Secondary | ICD-10-CM

## 2013-03-23 DIAGNOSIS — N898 Other specified noninflammatory disorders of vagina: Secondary | ICD-10-CM

## 2013-03-23 DIAGNOSIS — R300 Dysuria: Secondary | ICD-10-CM

## 2013-03-23 DIAGNOSIS — B9689 Other specified bacterial agents as the cause of diseases classified elsewhere: Secondary | ICD-10-CM

## 2013-03-23 LAB — POCT URINALYSIS DIPSTICK
Ketones, UA: NEGATIVE
Leukocytes, UA: NEGATIVE
Protein, UA: 30
pH, UA: 7.5

## 2013-03-23 LAB — URINALYSIS, ROUTINE W REFLEX MICROSCOPIC
Bilirubin Urine: NEGATIVE
Glucose, UA: NEGATIVE mg/dL
Hgb urine dipstick: NEGATIVE
Ketones, ur: NEGATIVE mg/dL
Nitrite: NEGATIVE
pH: 6 (ref 5.0–8.0)

## 2013-03-23 NOTE — Patient Instructions (Signed)
Tina Chase,  We are doing several tests for your itching.  We will call you with the results and call in for medications as per test results.   Thanks [employer]

## 2013-03-23 NOTE — Progress Notes (Signed)
Subjective:     Patient ID: Tina Chase, female   DOB: 11-09-1940, 72 y.o.   MRN: [id number]  HPI Ms Corzine comes in with complains of vaginal itching. She had this last visit and was prescribed fluconazole. At that time she had a white discharge from her vagina as well. She benefited from the treatment but has itching again. She has very little burning on urination complaint but does have it sometime. She does not have any discharge this time, and is not sexually active. She complains of a fishy odor from her urine. She denies fever, chills or abdominal pain.  She is regular with her other medications. She takes her blood pressure at home, and it is usually around 140/80. She has recently started walking for 1 hour as an exercise and feels good about it. No chest pain, palpitations, or interim illnesses noted.  Review of Systems As per HPI    Objective:   Physical Exam General: No acute distress. Morbid obesity HEENT: PERRL, EOMI.   CV: S1S2 RRR, no murmur Lungs: Bilateral Vesicular breath sounds.  Abdomen: Soft, non-tender, benign Pedal Edema: none  Neuro: Alert and oriented times 3. No gross deficits seen.  Vulvovaginal area - externally no abnormal lesions seen. Vaginal wall seems dry. Could not smell any offensive odors. No erythema. No lesions seen on the outside on the valval regions. Swab collected from vaginal secretions.      Assessment & Plan:     We will do a UA dipstick and send urine for analysis. We will also do a wet prep probe. The patient might also be suffering from dry vagina, and if the wet prep is negative then I will order a vaginal estrogen cream for her.   Her blood pressure was mildly elevated in clinic today, however, since her home blood pressure has been lower according to her report, I will not change her medications today.   Return to clinic in 3 months or earlier as needed.

## 2013-03-24 DIAGNOSIS — B9689 Other specified bacterial agents as the cause of diseases classified elsewhere: Secondary | ICD-10-CM | POA: Insufficient documentation

## 2013-03-24 LAB — URINALYSIS, MICROSCOPIC ONLY
Casts: NONE SEEN
Crystals: NONE SEEN

## 2013-03-24 MED ORDER — METRONIDAZOLE 500 MG PO TABS: 500.0000 mg | ORAL_TABLET | Freq: Two times a day (BID) | ORAL | Status: AC

## 2013-03-24 NOTE — Progress Notes (Signed)
Lab results positive for gardenella. Will prescribe metronidazole 500 mg twice daily for 7 days.

## 2013-05-25 ENCOUNTER — Other Ambulatory Visit: Payer: Self-pay | Admitting: Internal Medicine

## 2013-05-25 DIAGNOSIS — E790 Hyperuricemia without signs of inflammatory arthritis and tophaceous disease: Secondary | ICD-10-CM | POA: Insufficient documentation

## 2013-05-25 DIAGNOSIS — N200 Calculus of kidney: Secondary | ICD-10-CM

## 2013-06-14 ENCOUNTER — Other Ambulatory Visit: Payer: Self-pay

## 2013-06-14 DIAGNOSIS — Z1231 Encounter for screening mammogram for malignant neoplasm of breast: Secondary | ICD-10-CM

## 2013-07-11 ENCOUNTER — Other Ambulatory Visit: Payer: Self-pay | Admitting: *Deleted

## 2013-07-11 MED ORDER — AMLODIPINE BESYLATE 10 MG PO TABS: ORAL_TABLET | ORAL | Status: DC

## 2013-07-14 ENCOUNTER — Ambulatory Visit
Admission: RE | Admit: 2013-07-14 | Discharge: 2013-07-14 | Disposition: A | Discharge status: Home / Self Care | Payer: PRIVATE HEALTH INSURANCE | Source: Ambulatory Visit

## 2013-07-14 DIAGNOSIS — Z1231 Encounter for screening mammogram for malignant neoplasm of breast: Secondary | ICD-10-CM

## 2013-08-10 ENCOUNTER — Other Ambulatory Visit: Payer: Self-pay | Admitting: *Deleted

## 2013-08-10 DIAGNOSIS — I10 Essential (primary) hypertension: Secondary | ICD-10-CM

## 2013-08-10 DIAGNOSIS — E785 Hyperlipidemia, unspecified: Secondary | ICD-10-CM

## 2013-08-10 MED ORDER — METOPROLOL TARTRATE 50 MG PO TABS: 50.0000 mg | ORAL_TABLET | Freq: Two times a day (BID) | ORAL | Status: DC

## 2014-01-09 ENCOUNTER — Ambulatory Visit (INDEPENDENT_AMBULATORY_CARE_PROVIDER_SITE_OTHER): Payer: PRIVATE HEALTH INSURANCE | Admitting: Internal Medicine

## 2014-01-09 ENCOUNTER — Encounter: Payer: Self-pay | Admitting: Internal Medicine

## 2014-01-09 VITALS — BP 147/79 | HR 78 | Temp 98.6°F | Ht 62.75 in | Wt 319.8 lb

## 2014-01-09 DIAGNOSIS — R609 Edema, unspecified: Secondary | ICD-10-CM

## 2014-01-09 DIAGNOSIS — E669 Obesity, unspecified: Secondary | ICD-10-CM | POA: Insufficient documentation

## 2014-01-09 DIAGNOSIS — I5033 Acute on chronic diastolic (congestive) heart failure: Secondary | ICD-10-CM

## 2014-01-09 DIAGNOSIS — R600 Localized edema: Secondary | ICD-10-CM | POA: Insufficient documentation

## 2014-01-09 DIAGNOSIS — R197 Diarrhea, unspecified: Secondary | ICD-10-CM

## 2014-01-09 DIAGNOSIS — Z905 Acquired absence of kidney: Secondary | ICD-10-CM

## 2014-01-09 DIAGNOSIS — E785 Hyperlipidemia, unspecified: Secondary | ICD-10-CM

## 2014-01-09 DIAGNOSIS — Z0000 Encounter for general adult medical examination without abnormal findings: Secondary | ICD-10-CM

## 2014-01-09 DIAGNOSIS — I10 Essential (primary) hypertension: Secondary | ICD-10-CM

## 2014-01-09 MED ORDER — POTASSIUM CITRATE ER 15 MEQ (1620 MG) PO TBCR: 2.0000 | EXTENDED_RELEASE_TABLET | Freq: Two times a day (BID) | ORAL | Status: DC

## 2014-01-09 MED ORDER — MEDICAL COMPRESSION STOCKINGS MISC: Status: DC

## 2014-01-09 MED ORDER — ALLOPURINOL 300 MG PO TABS: 300.0000 mg | ORAL_TABLET | Freq: Every day | ORAL | Status: DC

## 2014-01-09 MED ORDER — FUROSEMIDE 40 MG PO TABS: 40.0000 mg | ORAL_TABLET | Freq: Two times a day (BID) | ORAL | Status: DC

## 2014-01-09 NOTE — Assessment & Plan Note (Signed)
Well controlled. There was a discussion about changing Amlodipine and swapping it with Clonidine this visit because amlodipine could be a cause of her leg edema. However, the patient at this time seems vary of making any changes to her medications regimen given her nephrectomy. I discussed the side effects of clonidine with her, and provided her with material to study before she can make this decision. No changes in medications made this visit.

## 2014-01-09 NOTE — Assessment & Plan Note (Signed)
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 Patient could not provide sample today.   Plan:  Continue imodium as need for diarrhea per insert instructions.   Provide sample when you can for testing.   Referral to GI for consultation.

## 2014-01-09 NOTE — Assessment & Plan Note (Signed)
Health Maintenance Due  Topic Date Due  . Zostavax  10/22/2002   Not interested in Zostavax at this time.  Rest HM - uptodate.

## 2014-01-09 NOTE — Progress Notes (Signed)
Subjective:     Patient ID: Tina Chase, female   DOB: February 22, 1941, 73 y.o.   MRN: [id number]  HPI [name] follows the clinic for hypertension, hyperlipidemia, asthma, and obesity. She is S/p nephrectomy, right side, for nephrolithiasis. She also has a history of primary ventral hernia repair 12/03/2101. At the time, she had SBO, secondary to strangulated hernia, she underwent an exploratory lap with lysis of adhesions and resection of about 90 cm of mid-ileum. She also has a history of PE in 2013, however she chooses not to be on anticoagulation.  New Problems [name] presents with no new complaints. She reports compliance with her medications. She denies increasing shortness of breath, orthopnea, PND,   Old problems:  Leg edema - chronic, stasis related but no stasis skin changes so far. No leg pain, no recent increase in edema. Etiology could be multifactorial - grade 1 diastolic dysfunction, high dose amlodipine therapy.    Diarrhea - multiple times during the day, since ileal resection, not present at night. Better with imodium.   Hypertension - Well controlled on amlodipine 10 daily, metoprolol 50 twice daily, and furosemide 40 twice daily.  Hyperlipidemia - The patient has stopped taking pravastatin on her own decision. She would like to see what her cholesterol levels are before starting to take it back.   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   Neck pain, left side - s/p catheter placement in 2013 per her report. She says it is better, and does not bother her that much now.   Health Maintenance The patient missed flu shot last season. She is willing to take it this time when season commences. She is uptodate on all other items.   Review of Systems  Constitutional: Negative for fever, fatigue and unexpected weight change.  Respiratory: Negative for cough, choking,  chest tightness, shortness of breath (Not more than usual) and wheezing.   Cardiovascular: Positive for leg swelling (Chronic, bilateral). Negative for chest pain and palpitations.  Gastrointestinal: Positive for diarrhea. Negative for nausea, vomiting, abdominal pain, constipation, blood in stool, abdominal distention and rectal pain.  Genitourinary: Negative for difficulty urinating, vaginal pain and pelvic pain.  Musculoskeletal: Positive for arthralgias (Multiple joints, knee - chronic). Negative for gait problem, joint swelling and myalgias.  Neurological: Negative for dizziness, syncope, light-headedness and headaches.       No falls recently       Objective:   Physical Exam  Constitutional: She is oriented to person, place, and time. She appears well-developed and well-nourished. No distress.  Morbid obesity, african american  HENT:  Head: Normocephalic and atraumatic.  Eyes: Conjunctivae are normal. Pupils are equal, round, and reactive to light. Right eye exhibits no discharge. Left eye exhibits no discharge. No scleral icterus.  Neck: Normal range of motion. Neck supple. No JVD present. No tracheal deviation present. No thyromegaly present.  Cardiovascular: Normal rate, regular rhythm and normal heart sounds.  Exam reveals no friction rub.   No murmur heard. Pulmonary/Chest: Effort normal and breath sounds normal. No respiratory distress. She has no wheezes. She has no rales. She exhibits no tenderness.  Abdominal: Soft. Bowel sounds are normal.  Musculoskeletal: She exhibits edema (2+ chronic, bilateral lower extremity; BP TP pulses could not be felt due to edema). She exhibits no tenderness (Non-tender lower extremities).  Lymphadenopathy:    She has no cervical adenopathy.  Neurological: She  is alert and oriented to person, place, and time.  No sensory abnormalities on lower extremities, no motor deficits.   Skin: Skin is warm and dry. No rash noted. No erythema.   Psychiatric: She has a normal mood and affect.       Assessment & Plan:     Please see problem based charting.

## 2014-01-09 NOTE — Patient Instructions (Addendum)
Tina Chase,   A pleasure to meet you today.   Please follow up with your GI appointment for Diarrhea. The clinic will call you with a date.   We will call you with blood work results if abnormal.   We discussed changing amlodipine to clonidine - but have not done it today. You want to read about it, so I am giving you information on clonidine. Please talk to your kidney doctor about it too, if you wish. This change might help decrease the swelling in your feet.  I did refills on lasix today.  I would advise you to start taking Pravastatin - which you were on previously. We will discuss this more when we meet again, and when I get your lipid panel back.  Let us meet in 3 months.    Thanks, Aletta EdouardShilpa Nilani Hugill MD MPH 01/09/2014 8:59 AM Clinic Phone: [phone]   Clonidine tablets What is this medicine? CLONIDINE (KLOE ni deen) is used to treat high blood pressure. This medicine may be used for other purposes; ask your health care provider or pharmacist if you have questions. COMMON BRAND NAME(S): Catapres What should I tell my health care provider before I take this medicine? They need to know if you have any of these conditions: -kidney disease -an unusual or allergic reaction to clonidine, other medicines, foods, dyes, or preservatives -pregnant or trying to get pregnant -breast-feeding How should I use this medicine? Take this medicine by mouth with a glass of water. Follow the directions on the prescription label. Take your doses at regular intervals. Do not take your medicine more often than directed. Do not suddenly stop taking this medicine. You must gradually reduce the dose or you may get a dangerous increase in blood pressure. Ask your doctor or health care professional for advice. Talk to your pediatrician regarding the use of this medicine in children. Special care may be needed. Overdosage: If you think you have taken too much of this medicine contact a poison control center or  emergency room at once. NOTE: This medicine is only for you. Do not share this medicine with others. What if I miss a dose? If you miss a dose, take it as soon as you can. If it is almost time for your next dose, take only that dose. Do not take double or extra doses. What may interact with this medicine? Do not take this medicine with any of the following medications: -MAOIs like Carbex, Eldepryl, Marplan, Nardil, and Parnate This medicine may also interact with the following medications: -barbiturate medicines for inducing sleep or treating seizures like phenobarbital -certain medicines for blood pressure, heart disease, irregular heart beat -certain medicines for depression, anxiety, or psychotic disturbances -prescription pain medicines This list may not describe all possible interactions. Give your health care provider a list of all the medicines, herbs, non-prescription drugs, or dietary supplements you use. Also tell them if you smoke, drink alcohol, or use illegal drugs. Some items may interact with your medicine. What should I watch for while using this medicine? Visit your doctor or health care professional for regular checks on your progress. Check your heart rate and blood pressure regularly while you are taking this medicine. Ask your doctor or health care professional what your heart rate should be and when you should contact him or her. You may get drowsy or dizzy. Do not drive, use machinery, or do anything that needs mental alertness until you know how this medicine affects you. To avoid dizzy or  fainting spells, do not stand or sit up quickly, especially if you are an older person. Alcohol can make you more drowsy and dizzy. Avoid alcoholic drinks. Your mouth may get dry. Chewing sugarless gum or sucking hard candy, and drinking plenty of water will help. Do not treat yourself for coughs, colds, or pain while you are taking this medicine without asking your doctor or health care  professional for advice. Some ingredients may increase your blood pressure. If you are going to have surgery tell your doctor or health care professional that you are taking this medicine. What side effects may I notice from receiving this medicine? Side effects that you should report to your doctor or health care professional as soon as possible: -allergic reactions like skin rash, itching or hives, swelling of the face, lips, or tongue -anxiety, nervousness -chest pain -depression -fast, irregular heartbeat -swelling of feet or legs -unusually weak or tired Side effects that usually do not require medical attention (report to your doctor or health care professional if they continue or are bothersome): -change in sex drive or performance -constipation -headache This list may not describe all possible side effects. Call your doctor for medical advice about side effects. You may report side effects to FDA at 1-800-FDA-1088. Where should I keep my medicine? Keep out of the reach of children. Store at room temperature between 15 and 30 degrees C (59 and 86 degrees F). Protect from light. Keep container tightly closed. Throw away any unused medicine after the expiration date. NOTE: This sheet is a summary. It may not cover all possible information. If you have questions about this medicine, talk to your doctor, pharmacist, or health care provider.  2014, Elsevier/Gold Standard. (2011-01-14 13:01:28)

## 2014-01-09 NOTE — Assessment & Plan Note (Signed)
Assessment Chronic edema, likely due to diastolic heart dysfunction, and amlodipine side effect possible. Patient at this time does not want any change in medications. She is well controlled for BP on amlodipine and metoprolol. She takes lasix 40BID for diastolic dysfuntion and does not report any symptoms of decompensated CHF.    Plan  Discussed with patient about the side effects of amlodipine and clonidine (which I think might be a suitable medicine for her to swap with amlodipine given her kidney issues - she is compliant, intelligent, allergic to hydralazine). I gave her information about clonidine. She will discuss this next visit.   Continue taking lasix 40 twice daily.  Conservative measures like leg elevation known to patient. She practices that.   Compression stockings prescription given.

## 2014-01-09 NOTE — Assessment & Plan Note (Signed)
Follows with [facility] 

## 2014-01-09 NOTE — Assessment & Plan Note (Signed)
Will check lipid panel today. Advised her to take pravastatin regardless of the lipids, but she wishes to see her lipids first.

## 2014-01-10 LAB — BASIC METABOLIC PANEL WITH GFR
BUN: 12 mg/dL (ref 6–23)
CALCIUM: 9.7 mg/dL (ref 8.4–10.5)
CO2: 33 mEq/L — ABNORMAL HIGH (ref 19–32)
CREATININE: 0.8 mg/dL (ref 0.50–1.10)
Chloride: 98 mEq/L (ref 96–112)
GFR, EST AFRICAN AMERICAN: 86 mL/min
GFR, Est Non African American: 74 mL/min
Glucose, Bld: 109 mg/dL — ABNORMAL HIGH (ref 70–99)
Potassium: 4.1 mEq/L (ref 3.5–5.3)
SODIUM: 140 meq/L (ref 135–145)

## 2014-01-10 LAB — LIPID PANEL
CHOL/HDL RATIO: 3.5 ratio
Cholesterol: 195 mg/dL (ref 0–200)
HDL: 55 mg/dL (ref >39)
LDL CALC: 112 mg/dL — AB (ref 0–99)
TRIGLYCERIDES: 139 mg/dL (ref <150)
VLDL: 28 mg/dL (ref 0–40)

## 2014-04-10 ENCOUNTER — Ambulatory Visit (INDEPENDENT_AMBULATORY_CARE_PROVIDER_SITE_OTHER): Payer: PRIVATE HEALTH INSURANCE | Admitting: Internal Medicine

## 2014-04-10 ENCOUNTER — Encounter: Payer: Self-pay | Admitting: Internal Medicine

## 2014-04-10 VITALS — BP 145/82 | HR 79 | Temp 98.3°F | Ht 62.75 in | Wt 329.1 lb

## 2014-04-10 DIAGNOSIS — Z862 Personal history of diseases of the blood and blood-forming organs and certain disorders involving the immune mechanism: Secondary | ICD-10-CM

## 2014-04-10 DIAGNOSIS — Z8639 Personal history of other endocrine, nutritional and metabolic disease: Secondary | ICD-10-CM

## 2014-04-10 DIAGNOSIS — R600 Localized edema: Secondary | ICD-10-CM

## 2014-04-10 DIAGNOSIS — I10 Essential (primary) hypertension: Secondary | ICD-10-CM

## 2014-04-10 DIAGNOSIS — Z0000 Encounter for general adult medical examination without abnormal findings: Secondary | ICD-10-CM

## 2014-04-10 DIAGNOSIS — Z23 Encounter for immunization: Secondary | ICD-10-CM

## 2014-04-10 DIAGNOSIS — R609 Edema, unspecified: Secondary | ICD-10-CM

## 2014-04-10 DIAGNOSIS — E785 Hyperlipidemia, unspecified: Secondary | ICD-10-CM

## 2014-04-10 LAB — GLUCOSE, CAPILLARY: GLUCOSE-CAPILLARY: 107 mg/dL — AB (ref 70–99)

## 2014-04-10 LAB — POCT GLYCOSYLATED HEMOGLOBIN (HGB A1C): Hemoglobin A1C: 5.5

## 2014-04-10 NOTE — Patient Instructions (Signed)
Ms Tina Chase, Igo to meet you today.  Your high blood pressure is under control. I understand that you do not want to change your medications. We talked about weight control. I gave you some information on cholesterol and weight management.  We will check you for diabetes today. If the result is abnormal, I will call you back.  We will do a flu shot today.   Let us meet in 6 months. Thanks, [name] MD MPH 04/10/2014 9:17 AM

## 2014-04-10 NOTE — Assessment & Plan Note (Signed)
Weight change 319 lbs last visit to 329 lbs today. Diet conuseling provided. Insurance does not cover visit with Lupita Leash. Information and brochures regarding weight loss provided.

## 2014-04-10 NOTE — Progress Notes (Signed)
Subjective:     Patient ID: Tina Chase, female   DOB: 1941/02/21, 73 y.o.   MRN: [id number]  HPI Tina Chase follows [facility] for hypertension and hyperlipidemia. She has past medical history significant for nephrectomy s/p nephrolithiasis, and ileal resection, and bilateral lower extremity edema. Today, she has no new complaints. She has thought about changing amlodipine or combining it with another medicine, but she does not want to do that this time. She also does not want to continue with statins. She thinks she has gained some weight since the past visit, because she had been eating high carbohydrate foods, and she wants to go on a plan to reduce weight.   Review of Systems  Constitutional: Negative for fever, activity change, appetite change and unexpected weight change.  Respiratory: Negative for cough, chest tightness and shortness of breath.   Cardiovascular: Positive for leg swelling (chronic). Negative for chest pain.  Gastrointestinal: Positive for diarrhea (chronic issue). Negative for nausea, vomiting and rectal pain.  Musculoskeletal: Positive for neck pain (sometimes when she exerts. ). Negative for gait problem, joint swelling and myalgias.       Objective:   Physical Exam  Constitutional: She is oriented to person, place, and time. She appears well-developed and well-nourished.  obese  HENT:  Head: Normocephalic and atraumatic.  Eyes: Conjunctivae and EOM are normal. Pupils are equal, round, and reactive to light. Right eye exhibits no discharge. Left eye exhibits no discharge.  Neck: Normal range of motion. Neck supple.  Cardiovascular: Normal rate, regular rhythm and normal heart sounds.   No murmur heard. Pulmonary/Chest: Effort normal and breath sounds normal.  Abdominal: Soft.  Midline scar  Musculoskeletal: She exhibits edema (2+ chronic, skin overlying healthy no breaks).  Lymphadenopathy:    She has no cervical adenopathy.  Neurological: She is alert and  oriented to person, place, and time.  Skin: Skin is warm.  Psychiatric: She has a normal mood and affect. Her behavior is normal. Judgment and thought content normal.       Assessment & Plan:   Please see problem based charting.  Hypertension - Continue the current treatment. No change in medications currently.   Lower extremity edema - Patient wants to continue with amlodipine. Her edema may be multifactorial as well, biggest contributor could be her weight.   Weight gain - Weight change 319 lbs last visit to 329 lbs today. Diet conuseling provided. Insurance does not cover visit with Lupita Leash. Information and brochures regarding weight loss provided.   Hyperlipidemia - Does not want to take statins. Okay given current lipid panel and if she continues to work on her diet. Will keep following.    Health maintenance - Flu shot today. Diabetes A1c done today.

## 2014-04-10 NOTE — Assessment & Plan Note (Signed)
Does not want to take statins. Okay given current lipid panel and if she continues to work on her diet. Will keep following.

## 2014-04-10 NOTE — Assessment & Plan Note (Signed)
Flu shot today. Diabetes A1c done today.

## 2014-04-10 NOTE — Assessment & Plan Note (Signed)
Patient wants to continue with amlodipine. Her edema may be multifactorial as well, biggest contributor could be her weight.

## 2014-04-10 NOTE — Assessment & Plan Note (Signed)
Continue the current treatment. No change in medications currently.

## 2014-05-02 ENCOUNTER — Encounter: Payer: Self-pay | Admitting: Gastroenterology

## 2014-06-22 ENCOUNTER — Other Ambulatory Visit: Payer: Self-pay

## 2014-06-22 DIAGNOSIS — Z1231 Encounter for screening mammogram for malignant neoplasm of breast: Secondary | ICD-10-CM

## 2014-07-04 ENCOUNTER — Encounter (HOSPITAL_COMMUNITY): Payer: Self-pay | Admitting: Family Medicine

## 2014-07-04 ENCOUNTER — Emergency Department (HOSPITAL_COMMUNITY): Payer: PRIVATE HEALTH INSURANCE

## 2014-07-04 ENCOUNTER — Emergency Department (HOSPITAL_COMMUNITY)
Admission: EM | Admit: 2014-07-04 | Discharge: 2014-07-04 | Disposition: A | Discharge status: Home / Self Care | Payer: PRIVATE HEALTH INSURANCE | Attending: Emergency Medicine | Admitting: Emergency Medicine

## 2014-07-04 DIAGNOSIS — J45901 Unspecified asthma with (acute) exacerbation: Secondary | ICD-10-CM | POA: Insufficient documentation

## 2014-07-04 DIAGNOSIS — N189 Chronic kidney disease, unspecified: Secondary | ICD-10-CM | POA: Diagnosis not present

## 2014-07-04 DIAGNOSIS — J189 Pneumonia, unspecified organism: Secondary | ICD-10-CM

## 2014-07-04 DIAGNOSIS — R5383 Other fatigue: Secondary | ICD-10-CM | POA: Diagnosis present

## 2014-07-04 DIAGNOSIS — I252 Old myocardial infarction: Secondary | ICD-10-CM | POA: Insufficient documentation

## 2014-07-04 DIAGNOSIS — Z8739 Personal history of other diseases of the musculoskeletal system and connective tissue: Secondary | ICD-10-CM | POA: Insufficient documentation

## 2014-07-04 DIAGNOSIS — E669 Obesity, unspecified: Secondary | ICD-10-CM | POA: Insufficient documentation

## 2014-07-04 DIAGNOSIS — J159 Unspecified bacterial pneumonia: Secondary | ICD-10-CM | POA: Insufficient documentation

## 2014-07-04 DIAGNOSIS — R05 Cough: Secondary | ICD-10-CM

## 2014-07-04 DIAGNOSIS — Z87891 Personal history of nicotine dependence: Secondary | ICD-10-CM | POA: Diagnosis not present

## 2014-07-04 DIAGNOSIS — I129 Hypertensive chronic kidney disease with stage 1 through stage 4 chronic kidney disease, or unspecified chronic kidney disease: Secondary | ICD-10-CM | POA: Diagnosis not present

## 2014-07-04 DIAGNOSIS — Z79899 Other long term (current) drug therapy: Secondary | ICD-10-CM | POA: Diagnosis not present

## 2014-07-04 DIAGNOSIS — R059 Cough, unspecified: Secondary | ICD-10-CM

## 2014-07-04 LAB — COMPREHENSIVE METABOLIC PANEL
ALBUMIN: 3.7 g/dL (ref 3.5–5.2)
ALT: 16 U/L (ref 0–35)
ANION GAP: 13 (ref 5–15)
AST: 23 U/L (ref 0–37)
Alkaline Phosphatase: 104 U/L (ref 39–117)
BUN: 9 mg/dL (ref 6–23)
CHLORIDE: 98 meq/L (ref 96–112)
CO2: 28 mEq/L (ref 19–32)
CREATININE: 0.68 mg/dL (ref 0.50–1.10)
Calcium: 10.1 mg/dL (ref 8.4–10.5)
GFR calc Af Amer: >90 mL/min (ref >90)
GFR calc non Af Amer: 86 mL/min — ABNORMAL LOW (ref >90)
Glucose, Bld: 100 mg/dL — ABNORMAL HIGH (ref 70–99)
Potassium: 4.7 mEq/L (ref 3.7–5.3)
Sodium: 139 mEq/L (ref 137–147)
TOTAL PROTEIN: 7.7 g/dL (ref 6.0–8.3)
Total Bilirubin: 0.4 mg/dL (ref 0.3–1.2)

## 2014-07-04 LAB — URINALYSIS, ROUTINE W REFLEX MICROSCOPIC
Bilirubin Urine: NEGATIVE
GLUCOSE, UA: NEGATIVE mg/dL
Hgb urine dipstick: NEGATIVE
Ketones, ur: NEGATIVE mg/dL
LEUKOCYTES UA: NEGATIVE
NITRITE: NEGATIVE
PH: 7 (ref 5.0–8.0)
Protein, ur: NEGATIVE mg/dL
SPECIFIC GRAVITY, URINE: 1.004 — AB (ref 1.005–1.030)
Urobilinogen, UA: 0.2 mg/dL (ref 0.0–1.0)

## 2014-07-04 LAB — CBC WITH DIFFERENTIAL/PLATELET
BASOS ABS: 0 10*3/uL (ref 0.0–0.1)
BASOS PCT: 0 % (ref 0–1)
EOS ABS: 0.3 10*3/uL (ref 0.0–0.7)
Eosinophils Relative: 4 % (ref 0–5)
HCT: 41.4 % (ref 36.0–46.0)
HEMOGLOBIN: 13.3 g/dL (ref 12.0–15.0)
Lymphocytes Relative: 29 % (ref 12–46)
Lymphs Abs: 2.6 10*3/uL (ref 0.7–4.0)
MCH: 29.7 pg (ref 26.0–34.0)
MCHC: 32.1 g/dL (ref 30.0–36.0)
MCV: 92.4 fL (ref 78.0–100.0)
MONOS PCT: 9 % (ref 3–12)
Monocytes Absolute: 0.8 10*3/uL (ref 0.1–1.0)
NEUTROS ABS: 5.2 10*3/uL (ref 1.7–7.7)
Neutrophils Relative %: 58 % (ref 43–77)
Platelets: 217 10*3/uL (ref 150–400)
RBC: 4.48 MIL/uL (ref 3.87–5.11)
RDW: 14.3 % (ref 11.5–15.5)
WBC: 8.9 10*3/uL (ref 4.0–10.5)

## 2014-07-04 MED ORDER — SODIUM CHLORIDE 0.9 % IV BOLUS (SEPSIS)
1000.0000 mL | INTRAVENOUS | Status: AC
  Administered 2014-07-04: 1000 mL via INTRAVENOUS

## 2014-07-04 MED ORDER — ONDANSETRON HCL 4 MG/2ML IJ SOLN
4.0000 mg | Freq: Once | INTRAMUSCULAR | Status: DC
  Filled 2014-07-04: qty 2

## 2014-07-04 MED ORDER — IPRATROPIUM BROMIDE 0.02 % IN SOLN
0.5000 mg | RESPIRATORY_TRACT | Status: AC
  Administered 2014-07-04: 0.5 mg via RESPIRATORY_TRACT
  Filled 2014-07-04: qty 2.5

## 2014-07-04 MED ORDER — ALBUTEROL SULFATE HFA 108 (90 BASE) MCG/ACT IN AERS: 2.0000 | INHALATION_SPRAY | RESPIRATORY_TRACT | Status: DC | PRN

## 2014-07-04 MED ORDER — LEVOFLOXACIN 250 MG PO TABS: 750.0000 mg | ORAL_TABLET | Freq: Every day | ORAL | Status: DC

## 2014-07-04 MED ORDER — ALBUTEROL SULFATE (2.5 MG/3ML) 0.083% IN NEBU
5.0000 mg | INHALATION_SOLUTION | Freq: Once | RESPIRATORY_TRACT | Status: AC
  Administered 2014-07-04: 5 mg via RESPIRATORY_TRACT
  Filled 2014-07-04: qty 6

## 2014-07-04 MED ORDER — PREDNISONE 20 MG PO TABS: 40.0000 mg | ORAL_TABLET | Freq: Every day | ORAL | Status: DC

## 2014-07-04 NOTE — ED Provider Notes (Signed)
Pt seen and evaluated.  Reports cough, and sob, as well as URI sx and HTN.  BP better here.  Relief after nebs.  CXR + Lingular infiltrate (subtle).  96%sats.  Plan is DC with Abx, MDI, prednisone.   Rolland PorterMark Jerlene Rockers, MD 07/04/14 2221

## 2014-07-04 NOTE — ED Provider Notes (Signed)
CSN: [id number]     Arrival date & time 07/04/14  1902 History   First MD Initiated Contact with Patient 07/04/14 2026     Chief Complaint  Patient presents with  . Hypertension  . Fatigue   (Consider location/radiation/quality/duration/timing/severity/associated sxs/prior Treatment) HPI Tina Chase is a 73 yo female presenting with report of feeling tired and generally weak this morning with some nausea. She took her blood pressure and the reading was higher than her normal.  She reports drinking some water and she felt better.  The feeling recurred tonight.  She also reports a cough and runny nose and some chills that started about 6 days ago.  She endorses diarrhea that she has had ever since surgery removing part of her bowel but denies any vomiting, or chest pain..     Past Medical History  Diagnosis Date  . Hyperlipidemia   . Hypertension   . Hyperparathyroidism   . Obesity   . Hyperglycemia   . Hypercalcemia   . Asthma   . Shortness of breath     wtih exertion   . Chronic kidney disease     right non functioning kidney   . Arthritis     left knee  and left shoulder   . Myocardial infarction    Past Surgical History  Procedure Laterality Date  . Tubal ligation    . Other surgical history      D and C x 3   . Laparoscopic nephrectomy  08/19/2011    Procedure: LAPAROSCOPIC NEPHRECTOMY;  Surgeon: Milford Cageaniel Young Woodruff, MD;  Location: [facility];  Service: Urology;  Laterality: Right;  . Laparotomy  12/04/2011    Procedure: EXPLORATORY LAPAROTOMY;  Surgeon: Adolph Pollackodd J Rosenbower, MD;  Location: [facility];  Service: General;  Laterality: N/A;   bowel resection   . Incisional hernia repair  12/04/2011    Procedure: HERNIA REPAIR INCISIONAL;  Surgeon: Adolph Pollackodd J Rosenbower, MD;  Location: [facility];  Service: General;  Laterality: N/A;  . Application of wound vac  12/04/2011    Procedure: APPLICATION OF WOUND VAC;  Surgeon: Adolph Pollackodd J Rosenbower, MD;  Location: [facility];  Service: General;;  x two  .  Kidney cyst removal     Family History  Problem Relation Age of Onset  . Stroke Neg Hx   . Cancer Neg Hx   . Nephrolithiasis Neg Hx   . Hyperlipidemia Mother   . Hypertension Mother   . Heart disease Father     had a heart attack at age 71  . Heart attack Father [id number]  . Heart disease Brother     at age 71  . Heart attack Brother 50   History  Substance Use Topics  . Smoking status: Former Smoker    Types: Cigarettes    Quit date: 08/03/1968  . Smokeless tobacco: Never Used  . Alcohol Use: No   OB History    No data available     Review of Systems  Constitutional: Positive for chills and fatigue. Negative for fever.  HENT: Positive for congestion and rhinorrhea. Negative for sore throat.   Eyes: Negative for visual disturbance.  Respiratory: Positive for cough. Negative for shortness of breath.   Cardiovascular: Negative for chest pain and leg swelling.  Gastrointestinal: Positive for nausea and diarrhea. Negative for vomiting.  Genitourinary: Negative for dysuria.  Musculoskeletal: Negative for myalgias.  Skin: Negative for rash.  Neurological: Negative for weakness, numbness and headaches.    Allergies  Hydralazine; Aspirin; Crab;  and Lisinopril  Home Medications   Prior to Admission medications   Medication Sig Start Date End Date Taking? Authorizing Provider  allopurinol (ZYLOPRIM) 300 MG tablet Take 1 tablet (300 mg total) by mouth daily. 01/09/14 01/09/15  Aletta EdouardShilpa Bhardwaj, MD  amLODipine (NORVASC) 10 MG tablet TAKE 1 TABLET BY MOUTH EVERY DAY BEFORE LUNCH 07/11/13   Aletta EdouardShilpa Bhardwaj, MD  Elastic Bandages & Supports (MEDICAL COMPRESSION STOCKINGS) MISC Please place on feet as needed for swelling. 01/09/14   Aletta EdouardShilpa Bhardwaj, MD  fluconazole (DIFLUCAN) 150 MG tablet Take 1 tablet (150 mg total) by mouth once. 12/06/12   Aletta EdouardShilpa Bhardwaj, MD  furosemide (LASIX) 40 MG tablet Take 1 tablet (40 mg total) by mouth 2 (two) times daily. 01/09/14 01/09/15  Aletta EdouardShilpa Bhardwaj, MD   metoprolol (LOPRESSOR) 50 MG tablet Take 1 tablet (50 mg total) by mouth 2 (two) times daily. 08/10/13   Ulyess MortStewart Rogers, MD  Potassium Citrate 15 MEQ (1620 MG) TBCR Take 2 tablets by mouth 2 (two) times daily. 01/09/14   Aletta EdouardShilpa Bhardwaj, MD  PROAIR HFA 108 (90 BASE) MCG/ACT inhaler USE 2 PUFFS EVERY 4 TO 6 HOURS AS NEEDEDFOR WHEEZING 07/31/11   Edsel PetrinElizabeth L Golding, DO   BP 160/80 mmHg  Pulse 84  Temp(Src) 98.2 F (36.8 C)  Resp 22  Wt 329 lb (149.233 kg)  SpO2 95%  LMP 09/23/1986 Physical Exam  Constitutional: She appears well-developed and well-nourished. No distress.  HENT:  Head: Normocephalic and atraumatic.  Mouth/Throat: Oropharynx is clear and moist. No oropharyngeal exudate.  Eyes: Conjunctivae are normal.  Neck: Neck supple. No thyromegaly present.  Cardiovascular: Normal rate, regular rhythm and intact distal pulses.   Pulmonary/Chest: Effort normal. No respiratory distress. She has wheezes in the right middle field, the right lower field, the left middle field and the left lower field. She has no rhonchi. She has no rales. She exhibits no tenderness.  Abdominal: Soft. There is no tenderness.  Musculoskeletal: She exhibits no tenderness.  Lymphadenopathy:    She has no cervical adenopathy.  Neurological: She is alert.  Skin: Skin is warm and dry. No rash noted. She is not diaphoretic.  Psychiatric: She has a normal mood and affect.  Nursing note and vitals reviewed.   ED Course  Procedures (including critical care time) Labs Review Labs Reviewed  COMPREHENSIVE METABOLIC PANEL - Abnormal; Notable for the following:    Glucose, Bld 100 (*)    GFR calc non Af Amer 86 (*)    All other components within normal limits  URINALYSIS, ROUTINE W REFLEX MICROSCOPIC - Abnormal; Notable for the following:    Specific Gravity, Urine 1.004 (*)    All other components within normal limits  CBC WITH DIFFERENTIAL    Imaging Review CHEST 2 VIEW  COMPARISON:  12/10/2011  FINDINGS: Moderate cardiomegaly. Normal vascularity. Right hilum remains prominent which is a chronic finding and related to prominent pulmonary arteries. Low lung volumes. Bibasilar atelectasis. No pleural effusion or pneumothorax. Airspace opacities in lingula.  IMPRESSION: Cardiomegaly without decompensation  Airspace disease in the lingula.    EKG Interpretation None      MDM   Final diagnoses:  Community acquired pneumonia   73 yo diagnosed with CAP via chest xray. She reports feeling better after neb tx and fluid bolus. Labs are without any significant abnormalities. Pt is well-appearing, in no acute distress and vital signs are stable.  They appear safe to be discharged.  Discharge include follow-up with their PCP.  Return precautions provided.  Pt verbalizes understanding and is agreeable with plan. Discussed case with Dr. Fayrene Fearing.  Filed Vitals:   07/04/14 2200 07/04/14 2214 07/04/14 2215 07/04/14 2240  BP: 124/57 124/57 132/43   Pulse: 87 91 82   Temp:  97.9 F (36.6 C)  98.8 F (37.1 C)  TempSrc:  Oral  Oral  Resp:  26 20   Weight:      SpO2: 97% 94% 97%    Meds given in ED:  Medications  albuterol (PROVENTIL) (2.5 MG/3ML) 0.083% nebulizer solution 5 mg (5 mg Nebulization Given 07/04/14 2120)  ipratropium (ATROVENT) nebulizer solution 0.5 mg (0.5 mg Nebulization Given 07/04/14 2120)  sodium chloride 0.9 % bolus 1,000 mL (0 mLs Intravenous Stopped 07/04/14 2224)    Discharge Medication List as of 07/04/2014 10:31 PM    START taking these medications   Details  !! albuterol (PROVENTIL HFA;VENTOLIN HFA) 108 (90 BASE) MCG/ACT inhaler Inhale 2 puffs into the lungs every 4 (four) hours as needed for wheezing or shortness of breath., Starting 07/04/2014, Until Discontinued, Print    levofloxacin (LEVAQUIN) 250 MG tablet Take 3 tablets (750 mg total) by mouth daily., Starting 07/04/2014, Until Discontinued, Print    predniSONE (DELTASONE) 20 MG tablet  Take 2 tablets (40 mg total) by mouth daily., Starting 07/04/2014, Until Discontinued, Print     !! - Potential duplicate medications found. Please discuss with provider.         Harle Battiest, NP 07/07/14 1150  Rolland Porter, MD 07/14/14 [phone]

## 2014-07-04 NOTE — ED Notes (Signed)
Pt denies any nausea or abdominal discomfort at present. Does report mild SOB and congestion. Breath sounds with some rhonchi noted.

## 2014-07-04 NOTE — Discharge Instructions (Signed)
Please follow the directions provided.  Be sure to follow-up with your primary care provider to ensure your are getting better.  Take your antibiotic as directed.  Take the prednisone 1 tablet daily x 5 days.  You may use the albuterol inhaler 2 puffs every 4 hours to help with cough and work of breathing.  Don't hesitate to return for any new, worsening or concerning symptoms.     SEEK IMMEDIATE MEDICAL CARE IF:  Your illness becomes worse. This is especially true if you are elderly or weakened from any other disease.  You cannot control your cough with suppressants and are losing sleep.  You begin coughing up blood.  You develop pain which is getting worse or is uncontrolled with medicines.  Any of the symptoms which initially brought you in for treatment are getting worse rather than better.  You develop shortness of breath or chest pain.

## 2014-07-04 NOTE — ED Notes (Addendum)
Per pt sts she woke up this am feeling weak, hypertensive and funny in her stomach, and shaky. Denies pain. sts some lower abdominal pressure and after she voids it feels better. sts also a cough and some SOB.

## 2014-07-16 ENCOUNTER — Ambulatory Visit
Admission: RE | Admit: 2014-07-16 | Discharge: 2014-07-16 | Disposition: A | Discharge status: Home / Self Care | Payer: PRIVATE HEALTH INSURANCE | Source: Ambulatory Visit

## 2014-07-16 DIAGNOSIS — Z1231 Encounter for screening mammogram for malignant neoplasm of breast: Secondary | ICD-10-CM

## 2014-08-29 ENCOUNTER — Other Ambulatory Visit: Payer: Self-pay | Admitting: Internal Medicine

## 2014-10-09 ENCOUNTER — Ambulatory Visit: Payer: PRIVATE HEALTH INSURANCE | Admitting: Internal Medicine

## 2014-11-12 ENCOUNTER — Telehealth: Payer: Self-pay | Admitting: Internal Medicine

## 2014-11-12 NOTE — Telephone Encounter (Signed)
Call to patient to confirm appointment for 11/13/14 at 9:15 lmtcb ° °

## 2014-11-13 ENCOUNTER — Ambulatory Visit: Payer: Medicaid Other | Admitting: Internal Medicine

## 2014-12-12 ENCOUNTER — Encounter: Payer: Self-pay | Admitting: *Deleted

## 2015-01-01 ENCOUNTER — Ambulatory Visit: Payer: Medicaid Other | Admitting: Internal Medicine

## 2015-01-20 ENCOUNTER — Encounter (HOSPITAL_COMMUNITY): Payer: Self-pay | Admitting: Emergency Medicine

## 2015-01-20 ENCOUNTER — Emergency Department (HOSPITAL_COMMUNITY)
Admission: EM | Admit: 2015-01-20 | Discharge: 2015-01-20 | Disposition: A | Discharge status: Home / Self Care | Payer: Medicaid Other | Attending: Emergency Medicine | Admitting: Emergency Medicine

## 2015-01-20 DIAGNOSIS — Z79899 Other long term (current) drug therapy: Secondary | ICD-10-CM | POA: Insufficient documentation

## 2015-01-20 DIAGNOSIS — I129 Hypertensive chronic kidney disease with stage 1 through stage 4 chronic kidney disease, or unspecified chronic kidney disease: Secondary | ICD-10-CM | POA: Diagnosis not present

## 2015-01-20 DIAGNOSIS — I252 Old myocardial infarction: Secondary | ICD-10-CM | POA: Diagnosis not present

## 2015-01-20 DIAGNOSIS — E669 Obesity, unspecified: Secondary | ICD-10-CM | POA: Diagnosis not present

## 2015-01-20 DIAGNOSIS — Z7951 Long term (current) use of inhaled steroids: Secondary | ICD-10-CM | POA: Insufficient documentation

## 2015-01-20 DIAGNOSIS — N189 Chronic kidney disease, unspecified: Secondary | ICD-10-CM | POA: Diagnosis not present

## 2015-01-20 DIAGNOSIS — R42 Dizziness and giddiness: Secondary | ICD-10-CM | POA: Diagnosis not present

## 2015-01-20 DIAGNOSIS — J45909 Unspecified asthma, uncomplicated: Secondary | ICD-10-CM | POA: Diagnosis not present

## 2015-01-20 DIAGNOSIS — Z87891 Personal history of nicotine dependence: Secondary | ICD-10-CM | POA: Insufficient documentation

## 2015-01-20 DIAGNOSIS — M1990 Unspecified osteoarthritis, unspecified site: Secondary | ICD-10-CM | POA: Diagnosis not present

## 2015-01-20 LAB — I-STAT CHEM 8, ED
BUN: 6 mg/dL (ref 6–20)
CHLORIDE: 99 mmol/L — AB (ref 101–111)
Calcium, Ion: 1.26 mmol/L (ref 1.13–1.30)
Creatinine, Ser: 0.9 mg/dL (ref 0.44–1.00)
Glucose, Bld: 113 mg/dL — ABNORMAL HIGH (ref 65–99)
HEMATOCRIT: 48 % — AB (ref 36.0–46.0)
HEMOGLOBIN: 16.3 g/dL — AB (ref 12.0–15.0)
Potassium: 4.9 mmol/L (ref 3.5–5.1)
Sodium: 138 mmol/L (ref 135–145)
TCO2: 29 mmol/L (ref 0–100)

## 2015-01-20 LAB — URINALYSIS, ROUTINE W REFLEX MICROSCOPIC
Bilirubin Urine: NEGATIVE
GLUCOSE, UA: NEGATIVE mg/dL
Hgb urine dipstick: NEGATIVE
KETONES UR: NEGATIVE mg/dL
LEUKOCYTES UA: NEGATIVE
Nitrite: NEGATIVE
PH: 7.5 (ref 5.0–8.0)
PROTEIN: NEGATIVE mg/dL
Specific Gravity, Urine: 1.003 — ABNORMAL LOW (ref 1.005–1.030)
UROBILINOGEN UA: 0.2 mg/dL (ref 0.0–1.0)

## 2015-01-20 NOTE — ED Provider Notes (Signed)
CSN: [id number]     Arrival date & time 01/20/15  4098 History   First MD Initiated Contact with Patient 01/20/15 [phone]     Chief Complaint  Patient presents with  . Dizziness      HPI Patient is brought to the emergency department with complaints of dizziness 3 days.  Last 3 mornings when she stood up she becomes somewhat lightheaded and felt a sense of the room moving and spinning.  She states throughout the day she's done fine.  No nausea or vomiting.  No fevers or chills.  No unilateral arm or leg weakness.  She's concerned about the possibility of elevated blood pressures the cause with her blood pressure being 180s over 80 at home.  No recent change in her medications.  She reports compliance with her medication.   Past Medical History  Diagnosis Date  . Hyperlipidemia   . Hypertension   . Hyperparathyroidism   . Obesity   . Hyperglycemia   . Hypercalcemia   . Asthma   . Shortness of breath     wtih exertion   . Chronic kidney disease     right non functioning kidney   . Arthritis     left knee  and left shoulder   . Myocardial infarction    Past Surgical History  Procedure Laterality Date  . Tubal ligation    . Other surgical history      D and C x 3   . Laparoscopic nephrectomy  08/19/2011    Procedure: LAPAROSCOPIC NEPHRECTOMY;  Surgeon: Milford Cage, MD;  Location: [facility];  Service: Urology;  Laterality: Right;  . Laparotomy  12/04/2011    Procedure: EXPLORATORY LAPAROTOMY;  Surgeon: Adolph Pollack, MD;  Location: [facility];  Service: General;  Laterality: N/A;   bowel resection   . Incisional hernia repair  12/04/2011    Procedure: HERNIA REPAIR INCISIONAL;  Surgeon: Adolph Pollack, MD;  Location: [facility];  Service: General;  Laterality: N/A;  . Application of wound vac  12/04/2011    Procedure: APPLICATION OF WOUND VAC;  Surgeon: Adolph Pollack, MD;  Location: [facility];  Service: General;;  x two  . Kidney cyst removal     Family History  Problem  Relation Age of Onset  . Stroke Neg Hx   . Cancer Neg Hx   . Nephrolithiasis Neg Hx   . Hyperlipidemia Mother   . Hypertension Mother   . Heart disease Father     had a heart attack at age 73  . Heart attack Father 28  . Heart disease Brother     at age 2  . Heart attack Brother 50   History  Substance Use Topics  . Smoking status: Former Smoker    Types: Cigarettes    Quit date: 08/03/1968  . Smokeless tobacco: Never Used  . Alcohol Use: No   OB History    No data available     Review of Systems  All other systems reviewed and are negative.     Allergies  Hydralazine; Aspirin; Crab; and Lisinopril  Home Medications   Prior to Admission medications   Medication Sig Start Date End Date Taking? Authorizing Provider  acetaminophen (TYLENOL) 325 MG tablet Take 325 mg by mouth every 6 (six) hours as needed.   Yes Historical Provider, MD  albuterol (PROVENTIL HFA;VENTOLIN HFA) 108 (90 BASE) MCG/ACT inhaler Inhale 2 puffs into the lungs every 4 (four) hours as needed for wheezing or shortness  of breath. 07/04/14  Yes Harle Battiest, NP  allopurinol (ZYLOPRIM) 300 MG tablet Take 1 tablet (300 mg total) by mouth daily. 01/09/14 01/20/15 Yes [name], MD  amLODipine (NORVASC) 10 MG tablet TAKE 1 TABLET BY MOUTH DAILY BEFORE LUNCH 08/31/14  Yes [name], MD  Elastic Bandages & Supports (MEDICAL COMPRESSION STOCKINGS) MISC Please place on feet as needed for swelling. 01/09/14  Yes [name], MD  furosemide (LASIX) 40 MG tablet Take 1 tablet (40 mg total) by mouth 2 (two) times daily. 01/09/14 01/20/15 Yes [name], MD  metoprolol (LOPRESSOR) 50 MG tablet TAKE 1 TABLET BY MOUTH TWICE DAILY 08/31/14  Yes [name], MD  Potassium Citrate 15 MEQ (1620 MG) TBCR Take 2 tablets by mouth 2 (two) times daily. 01/09/14  Yes [name], MD  PROAIR HFA 108 (90 BASE) MCG/ACT inhaler USE 2 PUFFS EVERY 4 TO 6 HOURS AS NEEDEDFOR WHEEZING 07/31/11  Yes Edsel Petrin, DO   BP 131/65 mmHg  Pulse 58  Temp(Src) 98 F (36.7 C)  Resp 18  Wt 329 lb (149.233 kg)  SpO2 99%  LMP 09/23/1986 Physical Exam  Constitutional: She is oriented to person, place, and time. She appears well-developed and well-nourished. No distress.  HENT:  Head: Normocephalic and atraumatic.  Eyes: EOM are normal.  Neck: Normal range of motion.  Cardiovascular: Normal rate, regular rhythm and normal heart sounds.   Pulmonary/Chest: Effort normal and breath sounds normal.  Abdominal: Soft. She exhibits no distension. There is no tenderness.  Musculoskeletal: Normal range of motion.  Neurological: She is alert and oriented to person, place, and time.  Skin: Skin is warm and dry.  Psychiatric: She has a normal mood and affect. Judgment normal.  Nursing note and vitals reviewed.   ED Course  Procedures (including critical care time) Labs Review Labs Reviewed  URINALYSIS, ROUTINE W REFLEX MICROSCOPIC (NOT AT [facility]) - Abnormal; Notable for the following:    Color, Urine STRAW (*)    Specific Gravity, Urine 1.003 (*)    All other components within normal limits  I-STAT CHEM 8, ED - Abnormal; Notable for the following:    Chloride 99 (*)    Glucose, Bld 113 (*)    Hemoglobin 16.3 (*)    HCT 48.0 (*)    All other components within normal limits    Imaging Review No results found.   EKG Interpretation None      MDM   Final diagnoses:  Dizziness    11:45 AM Patient feels much better this time without intervention here in the emergency department.  This may represent benign positional vertigo versus mild volume depletion.  Hemodynamically she is stable.  She feels better.  Her labs and urine are without abnormality.  Patient be discharged home in good condition.    Azalia Bilis, MD 01/20/15 1146

## 2015-01-20 NOTE — ED Notes (Signed)
Pt A&OX4, ambulatory at d/c with steady gait, NAD 

## 2015-01-20 NOTE — ED Notes (Signed)
Pt ambulated well to the restroom  

## 2015-01-20 NOTE — Discharge Instructions (Signed)

## 2015-01-20 NOTE — ED Notes (Signed)
Pt reports 3 days ago she felt dizzy, the room was spinning and she felt her BP was elevated, pt drank some water and was able to get out of bed. Pt A&OX4, NAD noted. Pt denies chest pain. Pt stated 180/80s for BPs. No changes with meds.

## 2015-02-11 DIAGNOSIS — N200 Calculus of kidney: Secondary | ICD-10-CM | POA: Diagnosis not present

## 2015-02-13 ENCOUNTER — Telehealth: Payer: Self-pay | Admitting: Internal Medicine

## 2015-02-13 NOTE — Telephone Encounter (Signed)
Call to patient to confirm appointment for 02/14/15 at 8:45 lmtcb

## 2015-02-14 ENCOUNTER — Ambulatory Visit (INDEPENDENT_AMBULATORY_CARE_PROVIDER_SITE_OTHER): Payer: Medicare Other | Admitting: Internal Medicine

## 2015-02-14 ENCOUNTER — Encounter: Payer: Self-pay | Admitting: Internal Medicine

## 2015-02-14 VITALS — BP 151/84 | HR 72 | Temp 97.8°F | Ht 64.0 in | Wt 314.1 lb

## 2015-02-14 DIAGNOSIS — N200 Calculus of kidney: Secondary | ICD-10-CM

## 2015-02-14 DIAGNOSIS — I10 Essential (primary) hypertension: Secondary | ICD-10-CM | POA: Diagnosis not present

## 2015-02-14 DIAGNOSIS — Z0000 Encounter for general adult medical examination without abnormal findings: Secondary | ICD-10-CM

## 2015-02-14 DIAGNOSIS — E785 Hyperlipidemia, unspecified: Secondary | ICD-10-CM

## 2015-02-14 DIAGNOSIS — Z6841 Body Mass Index (BMI) 40.0 and over, adult: Secondary | ICD-10-CM

## 2015-02-14 DIAGNOSIS — I5033 Acute on chronic diastolic (congestive) heart failure: Secondary | ICD-10-CM

## 2015-02-14 LAB — LIPID PANEL
Cholesterol: 159 mg/dL (ref 0–200)
HDL: 49 mg/dL (ref >=46)
LDL Cholesterol: 91 mg/dL (ref 0–99)
TRIGLYCERIDES: 97 mg/dL (ref <150)
Total CHOL/HDL Ratio: 3.2 Ratio
VLDL: 19 mg/dL (ref 0–40)

## 2015-02-14 LAB — CBC
HEMATOCRIT: 39.5 % (ref 36.0–46.0)
Hemoglobin: 12.9 g/dL (ref 12.0–15.0)
MCH: 30.6 pg (ref 26.0–34.0)
MCHC: 32.7 g/dL (ref 30.0–36.0)
MCV: 93.8 fL (ref 78.0–100.0)
MPV: 10.2 fL (ref 8.6–12.4)
Platelets: 237 10*3/uL (ref 150–400)
RBC: 4.21 MIL/uL (ref 3.87–5.11)
RDW: 14.5 % (ref 11.5–15.5)
WBC: 7.5 10*3/uL (ref 4.0–10.5)

## 2015-02-14 LAB — BASIC METABOLIC PANEL WITH GFR
BUN: 19 mg/dL (ref 6–23)
CALCIUM: 9.9 mg/dL (ref 8.4–10.5)
CO2: 33 mEq/L — ABNORMAL HIGH (ref 19–32)
Chloride: 99 mEq/L (ref 96–112)
Creat: 0.93 mg/dL (ref 0.50–1.10)
GFR, Est African American: 71 mL/min
GFR, Est Non African American: 62 mL/min
Glucose, Bld: 96 mg/dL (ref 70–99)
POTASSIUM: 3.8 meq/L (ref 3.5–5.3)
SODIUM: 143 meq/L (ref 135–145)

## 2015-02-14 MED ORDER — FUROSEMIDE 20 MG PO TABS: 40.0000 mg | ORAL_TABLET | Freq: Every day | ORAL | Status: DC

## 2015-02-14 MED ORDER — AMLODIPINE BESYLATE 10 MG PO TABS: 10.0000 mg | ORAL_TABLET | Freq: Every day | ORAL | Status: DC

## 2015-02-14 NOTE — Patient Instructions (Signed)
Ms Tina Chase,   Thank you for your visit. You are doing well with your "no sugar" diet. You seem to have lost weight! Keep it up.  We will do some preventive blood tests today. If the results are abnormal, I will call you.  Please tell about Potassium Citrate to your kidney doctor on Monday.  Please keep taking your medicines as you are.  We can see each other in 4 months.  Thanks Tina EdouardShilpa Clarice Zulauf MD MPH 02/14/2015 9:47 AM [facility] [phone]

## 2015-02-15 NOTE — Assessment & Plan Note (Addendum)
Recent chemistries done. Within normal ranges. Lipid panel done.

## 2015-02-15 NOTE — Assessment & Plan Note (Signed)
Patient actively trying to reduce weight.  Congratulated her on her efforts.

## 2015-02-15 NOTE — Assessment & Plan Note (Signed)
BP Readings from Last 3 Encounters:  02/14/15 151/84  01/20/15 165/85  07/04/14 132/43   Continue current therapy - amlodipine, lasix, metoprolol.

## 2015-02-15 NOTE — Progress Notes (Signed)
   Subjective:    Patient ID: Tina Chase, female    DOB: Apr 13, 1941, 74 y.o.   MRN: [id number]  HPI  Tina Chase is a 74 year old female with past medical history of hypertension, dyslipidemia, morbid obesity and nephrolithiasis, pulmonary embolism with completed warfarin course, and bilateral lower extremity edema. She comes in with no new complaints. She has lost weight as she has been practicing a "No sugar diet" for the past 1-2 months. She feels lighter and more active. She had an ED visit on 01/20/15 for dizziness which resolved without intervention. She does not feel dizzy anymore. She has some trouble digesting her potassium citrate tablets that her kidney doctor has put her on, she reports that she has an appointment on 01/19/15 and she will mention this to him. No new shortness of breath or chest pain reported.  Review of Systems  Constitutional: Negative for fever, diaphoresis and fatigue.  Respiratory: Positive for shortness of breath (baseline). Negative for cough, choking, chest tightness and wheezing.   Cardiovascular: Positive for leg swelling (chronic). Negative for chest pain and palpitations.  Gastrointestinal: Negative for nausea, vomiting, diarrhea and constipation.  Endocrine: Negative for polydipsia, polyphagia and polyuria.  Genitourinary: Negative for difficulty urinating.  Neurological: Negative for dizziness.       Objective:   Physical Exam  Constitutional: She is oriented to person, place, and time. She appears well-developed and well-nourished.  obese  HENT:  Head: Normocephalic and atraumatic.  Eyes: Conjunctivae and EOM are normal. Pupils are equal, round, and reactive to light. Right eye exhibits no discharge. Left eye exhibits no discharge.  Neck: Normal range of motion. Neck supple.  Cardiovascular: Normal rate, regular rhythm and normal heart sounds.   No murmur heard. Pulmonary/Chest: Effort normal and breath sounds normal.  Abdominal: Soft.    Midline scar  Musculoskeletal: She exhibits no edema.  Lymphadenopathy:    She has no cervical adenopathy.  Neurological: She is alert and oriented to person, place, and time.  Skin: Skin is warm.  Psychiatric: She has a normal mood and affect. Her behavior is normal. Judgment and thought content normal.      Assessment & Plan:   Please see problem based charting.

## 2015-02-15 NOTE — Assessment & Plan Note (Signed)
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 Next appointment on this Monday. No new symptoms.

## 2015-02-15 NOTE — Assessment & Plan Note (Signed)
Lipid panel today.  Lab Results  Component Value Date   CHOL 159 02/14/2015   HDL 49 02/14/2015   LDLCALC 91 02/14/2015   TRIG 97 02/14/2015   CHOLHDL 3.2 02/14/2015

## 2015-02-18 DIAGNOSIS — Z905 Acquired absence of kidney: Secondary | ICD-10-CM | POA: Diagnosis not present

## 2015-02-18 DIAGNOSIS — N200 Calculus of kidney: Secondary | ICD-10-CM | POA: Diagnosis not present

## 2015-02-18 DIAGNOSIS — R8299 Other abnormal findings in urine: Secondary | ICD-10-CM | POA: Diagnosis not present

## 2015-04-26 ENCOUNTER — Encounter: Payer: Self-pay | Admitting: *Deleted

## 2015-06-25 ENCOUNTER — Other Ambulatory Visit: Payer: Self-pay

## 2015-06-25 DIAGNOSIS — Z1231 Encounter for screening mammogram for malignant neoplasm of breast: Secondary | ICD-10-CM

## 2015-07-08 ENCOUNTER — Encounter: Payer: Self-pay | Admitting: Student in an Organized Health Care Education/Training Program

## 2015-07-08 ENCOUNTER — Ambulatory Visit (INDEPENDENT_AMBULATORY_CARE_PROVIDER_SITE_OTHER): Payer: Medicare Other | Admitting: Student in an Organized Health Care Education/Training Program

## 2015-07-08 VITALS — BP 168/89 | HR 82 | Temp 98.7°F | Ht 64.0 in | Wt 322.9 lb

## 2015-07-08 DIAGNOSIS — I10 Essential (primary) hypertension: Secondary | ICD-10-CM | POA: Diagnosis not present

## 2015-07-08 DIAGNOSIS — Z87442 Personal history of urinary calculi: Secondary | ICD-10-CM

## 2015-07-08 DIAGNOSIS — Z79899 Other long term (current) drug therapy: Secondary | ICD-10-CM

## 2015-07-08 DIAGNOSIS — Z23 Encounter for immunization: Secondary | ICD-10-CM

## 2015-07-08 DIAGNOSIS — N200 Calculus of kidney: Secondary | ICD-10-CM

## 2015-07-08 DIAGNOSIS — Z6841 Body Mass Index (BMI) 40.0 and over, adult: Secondary | ICD-10-CM | POA: Diagnosis not present

## 2015-07-08 DIAGNOSIS — G4730 Sleep apnea, unspecified: Secondary | ICD-10-CM

## 2015-07-08 DIAGNOSIS — R600 Localized edema: Secondary | ICD-10-CM

## 2015-07-08 DIAGNOSIS — Z905 Acquired absence of kidney: Secondary | ICD-10-CM

## 2015-07-08 MED ORDER — METOPROLOL TARTRATE 75 MG PO TABS: 75.0000 mg | ORAL_TABLET | Freq: Two times a day (BID) | ORAL | Status: DC

## 2015-07-08 NOTE — Assessment & Plan Note (Signed)
Blood pressure is elevated and above goal today. Initial systolic 168, repeat in the room still around 152. She has good compliance with her medications. Organic continue amlodipine and furosemide at their current doses. Increase metoprolol tartrate from 50 mg twice a day to 75 mg twice a day we are limited in the amount of agents that we can use because she had angioedema with ACE inhibitor. She was on HCTZ for some time but then had nephrolithiasis due to uric acid stones, so thiazide diuretics were considered contraindicated especially because she has a single kidney. I want to follow her up in about 2 months for another blood pressure check. If she continues to have elevated pressures we may have to resort to clonidine.

## 2015-07-08 NOTE — Progress Notes (Signed)
   See Encounters tab for problem-based medical decision making  __________________________________________________________  HPI:  74 year old woman with the below medical problems comes in today for follow-up of hypertension. She has had difficult to control hypertension historically. Reports good compliance with her medications. Blood pressure is elevated today on initial reading and recheck. Denies any chest pain or dyspnea on exertion. No lightheadedness on standing. To call her medications this morning. No fevers or chills, eating and drinking well. She does some exercise at home, [place] in [place] with a friend. She can walk about 30 minutes at a time. Denies much dyspnea on exertion.  She does have a large component of daytime somnolence. Reports that she snores at night and has for several years. Never had a sleep study. Has several family members with sleep apnea. She's had a long history of obesity that's been worsening over the last 5-10 years ever since [place]. Her weight has been pretty consistent around 320 pounds. She has a desire to lose weight, can't afford to programs like Weight Watchers. She has significant lower shimmery edema which she says is stable. Edema is much worse at the end of the day. She has to wear open toe shoes.   __________________________________________________________  Problem List: Patient Active Problem List   Diagnosis Date Noted  . Sleep apnea 07/08/2015  . Bilateral lower extremity edema 01/09/2014  . 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 Nephrolithiasis 05/25/2013  . Hypertension 12/22/2011  . Obesity, Class III, BMI 40-49.9 (morbid obesity) (HCC) 12/22/2011  . Asthma 12/04/2011  . Single kidney 09/15/2011  . Routine health maintenance 09/15/2011  . Dyslipidemia 08/13/2006    Medications: Reconciled today in Epic __________________________________________________________  Physical Exam:    Vital Signs: Filed Vitals:   07/08/15 0822  BP: 168/89  Pulse: 82  Temp: 98.7 F (37.1 C)  TempSrc: Oral  Height: 5\' 4"  (1.626 m)  Weight: 322 lb 14.4 oz (146.466 kg)  SpO2: 96%    Gen: Well appearing, NAD Neck: No cervical LAD, No thyromegaly or nodules, No JVD. CV: RRR, no murmurs Pulm: Normal effort, CTA throughout, no wheezing Abd: Soft, NT, ND, normal BS.  Ext: Warm, large bilateral LE edema with 1+ pitting and large non-pitting component.  Skin: No atypical appearing moles. No rashes

## 2015-07-08 NOTE — Assessment & Plan Note (Addendum)
Symptoms of daytime drowsiness and snoring episodes at night maker high risk for having obstructive sleep apnea related to her long-standing severe obesity. We talked about a screening sleep study which she is resistant to. We'll going to try to manage her weight over the next 2 months. She'll talk with her other family who uses CPAP machines. I think that's treating her sleep apnea would improve her obesity, Lotrimin edema, and hard to control hypertension. I encouraged her to strongly consider sleep study.

## 2015-07-08 NOTE — Assessment & Plan Note (Signed)
Obesity is her single largest factor which affects her quality of life. Currently weighs 320 pounds which is been consistent over the last several years. She is been as low as 280 pounds during 2013 when she was ill in the hospital and had her nephrectomy. She identified several lifestyle modifications she can make including improving her diet and walking on a daily basis.

## 2015-07-08 NOTE — Assessment & Plan Note (Signed)
Patient with prior high risk for nephrolithiasis leading to right-sided nephrectomy in 2013. Kidney stones or made up mostly of calcium citrate and urates. She is on allopurinol daily, not a candidate for thiazides, and takes potassium citrate for stone prevention. She follows intimately with urology. Has had no further symptoms suggestive of stone retention. We'll continue these medications and close monitoring.

## 2015-07-08 NOTE — Assessment & Plan Note (Signed)
Lower shimmery edema continues to be severe. 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 She has good response with Lasix, but looked over diuresed on Lasix 80 mg daily so now is only on 40 mg. Plan is to continue Lasix 40 mg daily. Last echocardiogram was 2012 and showed good ejection fraction. If her edema continues to be this severe and like to repeat the echo to look for pulmonary pressures to rule out right heart dysfunction and pulmonary hypertension.

## 2015-07-08 NOTE — Patient Instructions (Signed)
1. Increase Metoprolol to one and a half tablets twice daily. If you have light headedness or more fatigue, go back to just one tablet twice daily.   2. Continue to walk every day and eat a diet of vegetables, fruits, and whole grains. Losing weight will make you feel better, exercise more, and have less swelling in your legs.   3. Use tylenol as needed for your neck pain. We can think about a sleep study in the future if you have more trouble sleeping or daytime drowsiness.

## 2015-07-17 ENCOUNTER — Ambulatory Visit
Admission: RE | Admit: 2015-07-17 | Discharge: 2015-07-17 | Disposition: A | Discharge status: Home / Self Care | Payer: Medicare Other | Source: Ambulatory Visit

## 2015-07-17 DIAGNOSIS — Z1231 Encounter for screening mammogram for malignant neoplasm of breast: Secondary | ICD-10-CM | POA: Diagnosis not present

## 2015-07-18 ENCOUNTER — Encounter: Payer: Medicaid Other | Admitting: Internal Medicine

## 2015-09-09 ENCOUNTER — Ambulatory Visit (INDEPENDENT_AMBULATORY_CARE_PROVIDER_SITE_OTHER): Payer: Medicare Other | Admitting: Student in an Organized Health Care Education/Training Program

## 2015-09-09 ENCOUNTER — Encounter: Payer: Self-pay | Admitting: Student in an Organized Health Care Education/Training Program

## 2015-09-09 VITALS — BP 147/79 | HR 81 | Temp 98.2°F | Ht 64.0 in | Wt 323.2 lb

## 2015-09-09 DIAGNOSIS — R600 Localized edema: Secondary | ICD-10-CM | POA: Diagnosis not present

## 2015-09-09 DIAGNOSIS — Z6841 Body Mass Index (BMI) 40.0 and over, adult: Secondary | ICD-10-CM

## 2015-09-09 DIAGNOSIS — I10 Essential (primary) hypertension: Secondary | ICD-10-CM

## 2015-09-09 DIAGNOSIS — E66813 Obesity, class 3: Secondary | ICD-10-CM

## 2015-09-09 DIAGNOSIS — G4730 Sleep apnea, unspecified: Secondary | ICD-10-CM

## 2015-09-09 DIAGNOSIS — J4520 Mild intermittent asthma, uncomplicated: Secondary | ICD-10-CM

## 2015-09-09 DIAGNOSIS — Z7951 Long term (current) use of inhaled steroids: Secondary | ICD-10-CM

## 2015-09-09 MED ORDER — ALBUTEROL SULFATE HFA 108 (90 BASE) MCG/ACT IN AERS: 2.0000 | INHALATION_SPRAY | RESPIRATORY_TRACT | Status: DC | PRN

## 2015-09-09 MED ORDER — LOPERAMIDE HCL 2 MG PO CAPS: 2.0000 mg | ORAL_CAPSULE | Freq: Every day | ORAL | Status: DC | PRN

## 2015-09-09 NOTE — Patient Instructions (Signed)
1. Your blood pressure looks great. Keep up the medicines just as you are doing.   2. Think about a sleep study to make sure you dont have obstructive sleep apnea. Call me if you want me to order the study.   3. Come back in June for a weight check. We set a goal of 310 lbs at your next visit.

## 2015-09-09 NOTE — Assessment & Plan Note (Signed)
Had an upper respiratory infection a few weeks ago and use the last of her albuterol inhaler. No other symptoms currently. No wheezing. Good functional status and does not need rescue inhaler otherwise. Best characterized as intermittent asthma. I refilled albuterol inhaler for as needed.

## 2015-09-09 NOTE — Progress Notes (Signed)
   See Encounters tab for problem-based medical decision making  __________________________________________________________  HPI:  75 year old woman here for follow up of hypertension. We increased metoprolol at last visit. She reports good compliance. No side effects. Energy levels are good at home. No lightheadedness. Does not check BP at home. Eating and drinking well. Still eating a lot of simple sugars. Weight is stable from last visit. No fevers or chills. No CP, DOE, PND, or orthopnea. Sleeping fairly well, still has daytime fatigue.    __________________________________________________________  Problem List: Patient Active Problem List   Diagnosis Date Noted  . Sleep apnea 07/08/2015  . Bilateral lower extremity edema 01/09/2014  . 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 Nephrolithiasis 05/25/2013  . Hypertension 12/22/2011  . Obesity, Class III, BMI 40-49.9 (morbid obesity) (HCC) 12/22/2011  . Asthma 12/04/2011  . Single kidney 09/15/2011  . Routine health maintenance 09/15/2011  . Dyslipidemia 08/13/2006    Medications: Reconciled today in Epic __________________________________________________________  Physical Exam:  Vital Signs: Filed Vitals:   09/09/15 0850  BP: 147/79  Pulse: 81  Temp: 98.2 F (36.8 C)  TempSrc: Oral  Height:  (1.626 m)  Weight: 323 lb 3.2 oz (146.603 kg)  SpO2: 96%    Gen: Well appearing, NAD CV: RRR, 2/6 early systolic murmur Pulm: Normal effort, CTA throughout, no wheezing Abd: Soft, NT, ND, normal BS.  Ext: Warm, large lipedema bilaterally, no pitting Skin: No atypical appearing moles. No rashes

## 2015-09-09 NOTE — Assessment & Plan Note (Signed)
I counciled on the importance of nutrition. Offered a nutrition consult which she declined. I think she would see a lot of benefits from even modest weight loss. We set a goal of 10 lb loss by next visit in June.

## 2015-09-09 NOTE — Assessment & Plan Note (Signed)
Patient with thick neck circumference, daytime fatigue, and difficult to control hypertension making her high risk for underlying OSA. Never had a sleep study before. I recommended this again, as I did last visit, and she is going to continue to think about it.

## 2015-09-09 NOTE — Assessment & Plan Note (Addendum)
Blood pressure is under better control today since increasing metoprolol at our last visit. On recheck systolic was 138. Plan is to continue amlodipine, lasix, and metoprolol  bid. No ACE or ARB because of prior angioedema. No thiazide because of uric acid nephrolithiasis.

## 2015-09-09 NOTE — Assessment & Plan Note (Signed)
LE lipedema is stable, no pitting edema today. This is driven by obesity. She is doing fine on lasix  daily which we can continue.

## 2015-10-24 ENCOUNTER — Encounter (HOSPITAL_COMMUNITY): Payer: Self-pay | Admitting: Emergency Medicine

## 2015-10-24 DIAGNOSIS — I129 Hypertensive chronic kidney disease with stage 1 through stage 4 chronic kidney disease, or unspecified chronic kidney disease: Secondary | ICD-10-CM | POA: Insufficient documentation

## 2015-10-24 DIAGNOSIS — Z86711 Personal history of pulmonary embolism: Secondary | ICD-10-CM | POA: Diagnosis not present

## 2015-10-24 DIAGNOSIS — M1990 Unspecified osteoarthritis, unspecified site: Secondary | ICD-10-CM | POA: Diagnosis not present

## 2015-10-24 DIAGNOSIS — N189 Chronic kidney disease, unspecified: Secondary | ICD-10-CM | POA: Insufficient documentation

## 2015-10-24 DIAGNOSIS — K469 Unspecified abdominal hernia without obstruction or gangrene: Secondary | ICD-10-CM | POA: Insufficient documentation

## 2015-10-24 DIAGNOSIS — Z79899 Other long term (current) drug therapy: Secondary | ICD-10-CM | POA: Diagnosis not present

## 2015-10-24 DIAGNOSIS — Z87891 Personal history of nicotine dependence: Secondary | ICD-10-CM | POA: Insufficient documentation

## 2015-10-24 DIAGNOSIS — Z87442 Personal history of urinary calculi: Secondary | ICD-10-CM | POA: Diagnosis not present

## 2015-10-24 DIAGNOSIS — J45901 Unspecified asthma with (acute) exacerbation: Secondary | ICD-10-CM | POA: Diagnosis not present

## 2015-10-24 DIAGNOSIS — M549 Dorsalgia, unspecified: Secondary | ICD-10-CM | POA: Diagnosis present

## 2015-10-24 DIAGNOSIS — R0602 Shortness of breath: Secondary | ICD-10-CM | POA: Diagnosis not present

## 2015-10-24 DIAGNOSIS — M6281 Muscle weakness (generalized): Secondary | ICD-10-CM | POA: Insufficient documentation

## 2015-10-24 DIAGNOSIS — R002 Palpitations: Secondary | ICD-10-CM | POA: Insufficient documentation

## 2015-10-24 DIAGNOSIS — E669 Obesity, unspecified: Secondary | ICD-10-CM | POA: Diagnosis not present

## 2015-10-24 DIAGNOSIS — R109 Unspecified abdominal pain: Secondary | ICD-10-CM | POA: Diagnosis not present

## 2015-10-24 DIAGNOSIS — I252 Old myocardial infarction: Secondary | ICD-10-CM | POA: Diagnosis not present

## 2015-10-24 DIAGNOSIS — R000 Tachycardia, unspecified: Secondary | ICD-10-CM | POA: Diagnosis not present

## 2015-10-24 NOTE — ED Notes (Addendum)
Pt. reports right lateral back pain onset this week injured while sitting on her chair , denies fall , pain increases with movement and changing positions , respirations unlabored / denies chest pain . Hypertensive and tachycardic at arrival .

## 2015-10-24 NOTE — ED Notes (Signed)
EDP ( Dr. Clayborne DanaMesner) , charge nurse Alvy Beal( E. Brown ) , and nuse first ( E. Fields ) notified on pt.'s tachycardia/hypertension , no verbal order received from EDP , will move to a room as soon as possible .

## 2015-10-25 ENCOUNTER — Emergency Department (HOSPITAL_COMMUNITY)
Admission: EM | Admit: 2015-10-25 | Discharge: 2015-10-25 | Disposition: A | Discharge status: Home / Self Care | Payer: Medicare Other | Attending: Emergency Medicine | Admitting: Emergency Medicine

## 2015-10-25 ENCOUNTER — Emergency Department (HOSPITAL_COMMUNITY): Payer: Medicare Other

## 2015-10-25 DIAGNOSIS — R0602 Shortness of breath: Secondary | ICD-10-CM | POA: Diagnosis not present

## 2015-10-25 DIAGNOSIS — K469 Unspecified abdominal hernia without obstruction or gangrene: Secondary | ICD-10-CM

## 2015-10-25 DIAGNOSIS — R109 Unspecified abdominal pain: Secondary | ICD-10-CM | POA: Diagnosis not present

## 2015-10-25 LAB — URINALYSIS, ROUTINE W REFLEX MICROSCOPIC
Bilirubin Urine: NEGATIVE
GLUCOSE, UA: NEGATIVE mg/dL
HGB URINE DIPSTICK: NEGATIVE
KETONES UR: NEGATIVE mg/dL
Leukocytes, UA: NEGATIVE
Nitrite: NEGATIVE
PROTEIN: 30 mg/dL — AB
Specific Gravity, Urine: 1.012 (ref 1.005–1.030)
pH: 7.5 (ref 5.0–8.0)

## 2015-10-25 LAB — CBC WITH DIFFERENTIAL/PLATELET
BASOS ABS: 0 10*3/uL (ref 0.0–0.1)
BASOS PCT: 0 %
EOS ABS: 0.2 10*3/uL (ref 0.0–0.7)
EOS PCT: 2 %
HEMATOCRIT: 42.1 % (ref 36.0–46.0)
Hemoglobin: 14.2 g/dL (ref 12.0–15.0)
Lymphocytes Relative: 40 %
Lymphs Abs: 5.3 10*3/uL — ABNORMAL HIGH (ref 0.7–4.0)
MCH: 31.4 pg (ref 26.0–34.0)
MCHC: 33.7 g/dL (ref 30.0–36.0)
MCV: 93.1 fL (ref 78.0–100.0)
MONO ABS: 1.3 10*3/uL — AB (ref 0.1–1.0)
Monocytes Relative: 10 %
NEUTROS ABS: 6.4 10*3/uL (ref 1.7–7.7)
Neutrophils Relative %: 48 %
PLATELETS: 233 10*3/uL (ref 150–400)
RBC: 4.52 MIL/uL (ref 3.87–5.11)
RDW: 14 % (ref 11.5–15.5)
WBC: 13.3 10*3/uL — ABNORMAL HIGH (ref 4.0–10.5)

## 2015-10-25 LAB — URINE MICROSCOPIC-ADD ON

## 2015-10-25 LAB — BASIC METABOLIC PANEL
Anion gap: 10 (ref 5–15)
BUN: 8 mg/dL (ref 6–20)
CALCIUM: 10.1 mg/dL (ref 8.9–10.3)
CO2: 30 mmol/L (ref 22–32)
CREATININE: 0.89 mg/dL (ref 0.44–1.00)
Chloride: 101 mmol/L (ref 101–111)
GFR calc Af Amer: >60 mL/min (ref >60)
GLUCOSE: 105 mg/dL — AB (ref 65–99)
Potassium: 4.1 mmol/L (ref 3.5–5.1)
Sodium: 141 mmol/L (ref 135–145)

## 2015-10-25 MED ORDER — SODIUM CHLORIDE 0.9 % IV BOLUS (SEPSIS)
1000.0000 mL | Freq: Once | INTRAVENOUS | Status: AC
  Administered 2015-10-25: 1000 mL via INTRAVENOUS

## 2015-10-25 NOTE — ED Notes (Signed)
Dr. Mesner in to see pt. 

## 2015-10-25 NOTE — ED Provider Notes (Signed)
CSN: [id number]     Arrival date & time 10/24/15  2333 History  By signing my name below, I, Tina Chase, attest that this documentation has been prepared under the direction and in the presence of Marily Memos, MD. Electronically Signed: [name], ED Scribe. 10/25/2015. 2:06 AM.     Chief Complaint  Patient presents with  . Back Pain      The history is provided by the patient. No language interpreter was used.   HPI Comments: Tina Chase is a 75 y.o. female who has a PMHx of HLD, HTN, hyperparathyroidism, hyperglycemia, hypercalcemia, chronic kidney disease, MI, pulmonary embolism, and laparoscopic nephrectomy presents to the Emergency Department complaining of constant, gradual improving, moderate, stabbing, right lateral back pain with associated weakness in legs onset 5 days ago. Pt reports she injured her back last week and ever since she has been having back pain. She did not take any medication to alleviate her pain. Pt says her pain increases with change in position and movement. Pt has a hx of kidney stones but she says this feels different. She mentions smelling some weird fumes that she described as "burnt meat" in her apartment on Sunday which caused her to have palpitations, wheezing, SOB, and made her "jittery". Pt says she has felt like that in the past when she had pneumonia and wanted to be evaluated. Pt denies rash, fever, nausea, vomiting, diarrhea, constipation, dysuria, hematuria, or burning with urination.   Past Medical History  Diagnosis Date  . Hyperlipidemia   . Hypertension   . Hyperparathyroidism   . Obesity   . Hyperglycemia   . Hypercalcemia   . Asthma   . Shortness of breath     wtih exertion   . Chronic kidney disease     right non functioning kidney   . Arthritis     left knee  and left shoulder   . Myocardial infarction (HCC)   . Pulmonary embolism (HCC)     Provoked 2013   Past Surgical History  Procedure Laterality Date  . Tubal  ligation    . Other surgical history      D and C x 3   . Laparoscopic nephrectomy  08/19/2011    Procedure: LAPAROSCOPIC NEPHRECTOMY;  Surgeon: Milford Cage, MD;  Location: [facility];  Service: Urology;  Laterality: Right;  . Laparotomy  12/04/2011    Procedure: EXPLORATORY LAPAROTOMY;  Surgeon: Adolph Pollack, MD;  Location: [facility];  Service: General;  Laterality: N/A;   bowel resection   . Incisional hernia repair  12/04/2011    Procedure: HERNIA REPAIR INCISIONAL;  Surgeon: Adolph Pollack, MD;  Location: [facility];  Service: General;  Laterality: N/A;  . Application of wound vac  12/04/2011    Procedure: APPLICATION OF WOUND VAC;  Surgeon: Adolph Pollack, MD;  Location: [facility];  Service: General;;  x two  . Kidney cyst removal     Family History  Problem Relation Age of Onset  . Stroke Neg Hx   . Cancer Neg Hx   . Nephrolithiasis Neg Hx   . Hyperlipidemia Mother   . Hypertension Mother   . Heart disease Father     had a heart attack at age 63  . Heart attack Father 38  . Heart disease Brother     at age 27  . Heart attack Brother 34   Social History  Substance Use Topics  . Smoking status: Former Smoker    Types:  Cigarettes    Quit date: 08/03/1968  . Smokeless tobacco: Never Used  . Alcohol Use: No   OB History    No data available     Review of Systems  Constitutional: Negative for fever and appetite change.  Respiratory: Positive for wheezing.   Cardiovascular: Positive for palpitations.  Gastrointestinal: Negative for nausea, vomiting, diarrhea and constipation.  Musculoskeletal: Positive for back pain.  Neurological: Positive for weakness.  All other systems reviewed and are negative.     Allergies  Hydralazine; Aspirin; Crab; and Lisinopril  Home Medications   Prior to Admission medications   Medication Sig Start Date End Date Taking? Authorizing Provider  acetaminophen (TYLENOL) 325 MG tablet Take 325 mg by mouth every 6 (six) hours as  needed.   Yes Historical Provider, MD  albuterol (PROVENTIL HFA;VENTOLIN HFA) 108 (90 Base) MCG/ACT inhaler Inhale 2 puffs into the lungs every 4 (four) hours as needed for wheezing or shortness of breath. 09/09/15  Yes Tyson Alias, MD  allopurinol (ZYLOPRIM) 300 MG tablet Take 300 mg by mouth daily. 05/17/15  Yes Historical Provider, MD  amLODipine (NORVASC) 10 MG tablet Take 1 tablet (10 mg total) by mouth daily. 02/14/15  Yes [name], MD  furosemide (LASIX) 20 MG tablet Take 2 tablets (40 mg total) by mouth daily. 02/14/15 02/25/16 Yes [name], MD  loperamide (IMODIUM) 2 MG capsule Take 1 capsule (2 mg total) by mouth daily as needed for diarrhea or loose stools. 09/09/15  Yes Tyson Alias, MD  metoprolol 75 MG TABS Take 75 mg by mouth 2 (two) times daily. 07/08/15  Yes Tyson Alias, MD  Potassium Citrate 15 MEQ (1620 MG) TBCR Take 2 tablets by mouth 2 (two) times daily. 01/09/14  Yes [name], MD  Elastic Bandages & Supports (MEDICAL COMPRESSION STOCKINGS) MISC Please place on feet as needed for swelling. 01/09/14   [name], MD   Triage vitals: BP 171/112 mmHg  Pulse 127  Temp(Src) 98.6 F (37 C) (Oral)  Resp 20  SpO2 95%  LMP 09/23/1986 Physical Exam  Constitutional: She is oriented to person, place, and time. She appears well-developed and well-nourished. No distress.  HENT:  Head: Normocephalic and atraumatic.  Eyes: EOM are normal.  Neck: Normal range of motion.  Cardiovascular: Regular rhythm and normal heart sounds.  Tachycardia present.  Exam reveals no gallop and no friction rub.   No murmur heard. Slight tachycardia but still normal rhythm  Pulmonary/Chest: Effort normal and breath sounds normal.  Lungs are clear  Abdominal: Soft. She exhibits no distension. There is no tenderness.  Musculoskeletal: Normal range of motion.  No CVA tenderness, no rashes, no midline tenderness  Neurological: She is alert and oriented to person,  place, and time.  Skin: Skin is warm and dry.  Psychiatric: She has a normal mood and affect. Judgment normal.  Nursing note and vitals reviewed.   ED Course  Procedures  DIAGNOSTIC STUDIES: Oxygen Saturation is 95% on RA, adequate by my interpretation.  COORDINATION OF CARE:  2:03 AM Will order CT of abdomen and EKG. Discussed treatment plan which includes IV fluids and tracing of heart with pt at bedside and pt agreed to plan.  Labs Review Labs Reviewed  CBC WITH DIFFERENTIAL/PLATELET - Abnormal; Notable for the following:    WBC 13.3 (*)    Lymphs Abs 5.3 (*)    Monocytes Absolute 1.3 (*)    All other components within normal limits  BASIC METABOLIC PANEL - Abnormal;  Notable for the following:    Glucose, Bld 105 (*)    All other components within normal limits  URINALYSIS, ROUTINE W REFLEX MICROSCOPIC (NOT AT [facility]) - Abnormal; Notable for the following:    Protein, ur 30 (*)    All other components within normal limits  URINE MICROSCOPIC-ADD ON - Abnormal; Notable for the following:    Squamous Epithelial / LPF 6-30 (*)    Bacteria, UA FEW (*)    All other components within normal limits    Imaging Review Dg Chest 2 View  10/25/2015  CLINICAL DATA:  75 year old female with abdominal pain, and shortness of breath and right flank pain. EXAM: CHEST  2 VIEW COMPARISON:  Radiograph dated 07/04/2014 FINDINGS: Two views of the chest demonstrate mild bibasilar atelectatic changes. Slight increased density in the lingula may be atelectatic changes. Pneumonia is not excluded. There is no focal consolidation, pleural effusion, or pneumothorax. There is stable cardiomegaly. There is prominence of the right pulmonary artery is which may represent a degree of underlying pulmonary hypertension. No acute osseous pathology identified. IMPRESSION: Minimal increased density in the lingula, likely atelectatic changes. Pneumonia is less likely but not excluded. Clinical correlation is recommended.  Cardiomegaly with prominence of the central pulmonary vasculature may represent underlying pulmonary hypertension. Electronically Signed   By: Elgie CollardArash  Radparvar M.D.   On: 10/25/2015 03:19   Ct Renal Stone Study  10/25/2015  CLINICAL DATA:  Right flank pain this week. EXAM: CT ABDOMEN AND PELVIS WITHOUT CONTRAST TECHNIQUE: Multidetector CT imaging of the abdomen and pelvis was performed following the standard protocol without IV contrast. COMPARISON:  12/18/2011 FINDINGS: Lung bases are clear. Calcification in the aortic valve. Small esophageal hiatal hernia. Previous right nephrectomy. No residual or recurrent mass. Left kidney appears intact. No hydronephrosis or hydroureter. No renal, ureteral, or bladder stone. Bladder wall is not thickened. The unenhanced appearance of the liver, spleen, gallbladder, pancreas, adrenal glands, abdominal aorta, inferior vena cava, and retroperitoneal lymph nodes is unremarkable. Stomach and small bowel are decompressed. Scattered stool in the colon. No colonic distention. Scattered colonic diverticula. There is a broad-based right anterior abdominal wall hernia containing small bowel, mesenteric, and colon. There is some stranding in the mesenteric fat in the hernia suggesting fat necrosis. No evidence of proximal bowel or colonic obstruction. No free air or free fluid in the abdomen. Appendix is normal. Diverticulosis of the sigmoid colon without evidence of diverticulitis. Uterus and ovaries are not enlarged. No free or loculated pelvic fluid collections. No pelvic mass or lymphadenopathy. All degenerative changes in the spine and hips. No destructive bone lesions. IMPRESSION: Surgical absence of the right kidney. No stone or obstruction on the left. Large right abdominal wall hernia containing small and large bowel and mesenteric. Mesenteric stranding suggests fat necrosis. No bowel obstruction. Electronically Signed   By: Burman NievesWilliam  Stevens M.D.   On: 10/25/2015 03:32   I  have personally reviewed and evaluated these images and lab results as part of my medical decision-making.   EKG Interpretation   Date/Time:  Friday October 25 2015 03:12:05 EDT Ventricular Rate:  97 PR Interval:  179 QRS Duration: 95 QT Interval:  342 QTC Calculation: 434 R Axis:   -11 Text Interpretation:  Sinus rhythm Borderline T wave abnormalities  Confirmed by Olen Eaves MD, Barbara CowerJASON [phone]) on 10/25/2015 3:34:39 AM      MDM   Final diagnoses:  Abdominal hernia without obstruction and without gangrene, recurrence not specified, unspecified hernia type    Some  fat necrosis in abdomen which may be related to her abdominal pain. No other acute findings to explain symptoms (aside from non strangulated hernia). Symptoms improved in ED, Vitals improved in ED with pain and thus I feel they are related.  No indication for surgery at this time, however will likely need elective repair, CCS info given.  Atelectasis in lungs. No fever, significant cough or respiratory symptoms, so doubt it is pneumonia.   New Prescriptions: New Prescriptions   No medications on file     I have personally and contemperaneously reviewed labs and imaging and used in my decision making as above.   A medical screening exam was performed and I feel the patient has had an appropriate workup for their chief complaint at this time and likelihood of emergent condition existing is low. Their vital signs are stable. They have been counseled on decision, discharge, follow up and which symptoms necessitate immediate return to the emergency department.  They verbally stated understanding and agreement with plan and discharged in stable condition.   I personally performed the services described in this documentation, which was scribed in my presence. The recorded information has been reviewed and is accurate.      Marily Memos, MD 10/25/15 [phone]

## 2015-11-06 ENCOUNTER — Inpatient Hospital Stay (HOSPITAL_COMMUNITY)
Admission: EM | Admit: 2015-11-06 | Discharge: 2015-11-12 | DRG: 175 | Disposition: A | Discharge status: Home / Self Care | Payer: Medicare Other | Attending: Internal Medicine | Admitting: Internal Medicine

## 2015-11-06 ENCOUNTER — Emergency Department (HOSPITAL_COMMUNITY): Payer: Medicare Other

## 2015-11-06 ENCOUNTER — Encounter (HOSPITAL_COMMUNITY): Payer: Self-pay

## 2015-11-06 DIAGNOSIS — E213 Hyperparathyroidism, unspecified: Secondary | ICD-10-CM | POA: Diagnosis present

## 2015-11-06 DIAGNOSIS — I130 Hypertensive heart and chronic kidney disease with heart failure and stage 1 through stage 4 chronic kidney disease, or unspecified chronic kidney disease: Secondary | ICD-10-CM | POA: Diagnosis present

## 2015-11-06 DIAGNOSIS — I252 Old myocardial infarction: Secondary | ICD-10-CM | POA: Diagnosis not present

## 2015-11-06 DIAGNOSIS — G4733 Obstructive sleep apnea (adult) (pediatric): Secondary | ICD-10-CM | POA: Diagnosis not present

## 2015-11-06 DIAGNOSIS — R069 Unspecified abnormalities of breathing: Secondary | ICD-10-CM | POA: Diagnosis not present

## 2015-11-06 DIAGNOSIS — D6859 Other primary thrombophilia: Secondary | ICD-10-CM | POA: Diagnosis present

## 2015-11-06 DIAGNOSIS — I10 Essential (primary) hypertension: Secondary | ICD-10-CM | POA: Diagnosis present

## 2015-11-06 DIAGNOSIS — J9601 Acute respiratory failure with hypoxia: Secondary | ICD-10-CM | POA: Insufficient documentation

## 2015-11-06 DIAGNOSIS — I2699 Other pulmonary embolism without acute cor pulmonale: Secondary | ICD-10-CM | POA: Diagnosis present

## 2015-11-06 DIAGNOSIS — I2602 Saddle embolus of pulmonary artery with acute cor pulmonale: Principal | ICD-10-CM

## 2015-11-06 DIAGNOSIS — E785 Hyperlipidemia, unspecified: Secondary | ICD-10-CM | POA: Diagnosis not present

## 2015-11-06 DIAGNOSIS — Z79899 Other long term (current) drug therapy: Secondary | ICD-10-CM

## 2015-11-06 DIAGNOSIS — Z6841 Body Mass Index (BMI) 40.0 and over, adult: Secondary | ICD-10-CM | POA: Diagnosis not present

## 2015-11-06 DIAGNOSIS — Z87891 Personal history of nicotine dependence: Secondary | ICD-10-CM

## 2015-11-06 DIAGNOSIS — I517 Cardiomegaly: Secondary | ICD-10-CM | POA: Diagnosis not present

## 2015-11-06 DIAGNOSIS — R911 Solitary pulmonary nodule: Secondary | ICD-10-CM | POA: Diagnosis not present

## 2015-11-06 DIAGNOSIS — Z86711 Personal history of pulmonary embolism: Secondary | ICD-10-CM | POA: Diagnosis not present

## 2015-11-06 DIAGNOSIS — Z87442 Personal history of urinary calculi: Secondary | ICD-10-CM | POA: Diagnosis not present

## 2015-11-06 DIAGNOSIS — E790 Hyperuricemia without signs of inflammatory arthritis and tophaceous disease: Secondary | ICD-10-CM | POA: Diagnosis not present

## 2015-11-06 DIAGNOSIS — N200 Calculus of kidney: Secondary | ICD-10-CM

## 2015-11-06 DIAGNOSIS — I248 Other forms of acute ischemic heart disease: Secondary | ICD-10-CM | POA: Diagnosis present

## 2015-11-06 DIAGNOSIS — N189 Chronic kidney disease, unspecified: Secondary | ICD-10-CM | POA: Diagnosis present

## 2015-11-06 DIAGNOSIS — M1990 Unspecified osteoarthritis, unspecified site: Secondary | ICD-10-CM | POA: Diagnosis not present

## 2015-11-06 DIAGNOSIS — I5032 Chronic diastolic (congestive) heart failure: Secondary | ICD-10-CM | POA: Diagnosis not present

## 2015-11-06 DIAGNOSIS — Z7901 Long term (current) use of anticoagulants: Secondary | ICD-10-CM | POA: Diagnosis not present

## 2015-11-06 DIAGNOSIS — R0600 Dyspnea, unspecified: Secondary | ICD-10-CM | POA: Diagnosis present

## 2015-11-06 DIAGNOSIS — I2690 Septic pulmonary embolism without acute cor pulmonale: Secondary | ICD-10-CM | POA: Diagnosis not present

## 2015-11-06 DIAGNOSIS — E669 Obesity, unspecified: Secondary | ICD-10-CM | POA: Diagnosis present

## 2015-11-06 DIAGNOSIS — J4520 Mild intermittent asthma, uncomplicated: Secondary | ICD-10-CM | POA: Diagnosis not present

## 2015-11-06 DIAGNOSIS — E66813 Obesity, class 3: Secondary | ICD-10-CM | POA: Diagnosis present

## 2015-11-06 DIAGNOSIS — J45909 Unspecified asthma, uncomplicated: Secondary | ICD-10-CM | POA: Diagnosis not present

## 2015-11-06 DIAGNOSIS — R600 Localized edema: Secondary | ICD-10-CM | POA: Diagnosis present

## 2015-11-06 DIAGNOSIS — I890 Lymphedema, not elsewhere classified: Secondary | ICD-10-CM | POA: Diagnosis present

## 2015-11-06 LAB — CBC WITH DIFFERENTIAL/PLATELET
BASOS ABS: 0 10*3/uL (ref 0.0–0.1)
Basophils Relative: 0 %
EOS ABS: 0.2 10*3/uL (ref 0.0–0.7)
EOS PCT: 3 %
HCT: 42.6 % (ref 36.0–46.0)
Hemoglobin: 13.7 g/dL (ref 12.0–15.0)
LYMPHS PCT: 25 %
Lymphs Abs: 2.3 10*3/uL (ref 0.7–4.0)
MCH: 29.9 pg (ref 26.0–34.0)
MCHC: 32.2 g/dL (ref 30.0–36.0)
MCV: 93 fL (ref 78.0–100.0)
Monocytes Absolute: 0.6 10*3/uL (ref 0.1–1.0)
Monocytes Relative: 6 %
Neutro Abs: 6 10*3/uL (ref 1.7–7.7)
Neutrophils Relative %: 66 %
PLATELETS: 224 10*3/uL (ref 150–400)
RBC: 4.58 MIL/uL (ref 3.87–5.11)
RDW: 14.3 % (ref 11.5–15.5)
WBC: 9.2 10*3/uL (ref 4.0–10.5)

## 2015-11-06 LAB — I-STAT TROPONIN, ED: Troponin i, poc: 0.07 ng/mL (ref 0.00–0.08)

## 2015-11-06 LAB — I-STAT CHEM 8, ED
BUN: 13 mg/dL (ref 6–20)
Calcium, Ion: 1.18 mmol/L (ref 1.13–1.30)
Chloride: 102 mmol/L (ref 101–111)
Creatinine, Ser: 0.9 mg/dL (ref 0.44–1.00)
GLUCOSE: 127 mg/dL — AB (ref 65–99)
HCT: 47 % — ABNORMAL HIGH (ref 36.0–46.0)
HEMOGLOBIN: 16 g/dL — AB (ref 12.0–15.0)
POTASSIUM: 3.6 mmol/L (ref 3.5–5.1)
SODIUM: 141 mmol/L (ref 135–145)
TCO2: 28 mmol/L (ref 0–100)

## 2015-11-06 LAB — GLUCOSE, CAPILLARY: GLUCOSE-CAPILLARY: 102 mg/dL — AB (ref 65–99)

## 2015-11-06 LAB — D-DIMER, QUANTITATIVE (NOT AT ARMC)

## 2015-11-06 LAB — MRSA PCR SCREENING: MRSA BY PCR: NEGATIVE

## 2015-11-06 MED ORDER — ALLOPURINOL 300 MG PO TABS
300.0000 mg | ORAL_TABLET | Freq: Every day | ORAL | Status: DC
  Administered 2015-11-07 – 2015-11-12 (×6): 300 mg via ORAL
  Filled 2015-11-06 (×7): qty 1

## 2015-11-06 MED ORDER — ACETAMINOPHEN 650 MG RE SUPP: 650.0000 mg | Freq: Four times a day (QID) | RECTAL | Status: DC | PRN

## 2015-11-06 MED ORDER — IOPAMIDOL (ISOVUE-370) INJECTION 76%
INTRAVENOUS | Status: AC
  Administered 2015-11-06: 100 mL
  Filled 2015-11-06: qty 100

## 2015-11-06 MED ORDER — SODIUM CHLORIDE 0.9% FLUSH: 3.0000 mL | INTRAVENOUS | Status: DC | PRN

## 2015-11-06 MED ORDER — POTASSIUM CITRATE ER 10 MEQ (1080 MG) PO TBCR
20.0000 meq | EXTENDED_RELEASE_TABLET | Freq: Two times a day (BID) | ORAL | Status: DC
  Administered 2015-11-07 – 2015-11-12 (×12): 20 meq via ORAL
  Filled 2015-11-06 (×12): qty 2

## 2015-11-06 MED ORDER — HEPARIN (PORCINE) IN NACL 100-0.45 UNIT/ML-% IJ SOLN
1500.0000 [IU]/h | INTRAMUSCULAR | Status: DC
  Administered 2015-11-06: 1500 [IU]/h via INTRAVENOUS
  Filled 2015-11-06 (×2): qty 250

## 2015-11-06 MED ORDER — ACETAMINOPHEN 325 MG PO TABS: 650.0000 mg | ORAL_TABLET | Freq: Four times a day (QID) | ORAL | Status: DC | PRN

## 2015-11-06 MED ORDER — SODIUM CHLORIDE 0.9% FLUSH
3.0000 mL | Freq: Two times a day (BID) | INTRAVENOUS | Status: DC
  Administered 2015-11-06 – 2015-11-11 (×8): 3 mL via INTRAVENOUS

## 2015-11-06 MED ORDER — HEPARIN BOLUS VIA INFUSION
5000.0000 [IU] | Freq: Once | INTRAVENOUS | Status: AC
  Administered 2015-11-06: 5000 [IU] via INTRAVENOUS
  Filled 2015-11-06: qty 5000

## 2015-11-06 MED ORDER — ALBUTEROL SULFATE (2.5 MG/3ML) 0.083% IN NEBU
2.5000 mg | INHALATION_SOLUTION | RESPIRATORY_TRACT | Status: DC | PRN
  Administered 2015-11-08: 2.5 mg via RESPIRATORY_TRACT
  Filled 2015-11-06: qty 3

## 2015-11-06 NOTE — ED Notes (Signed)
Called CT to have patient scanned ASAP.

## 2015-11-06 NOTE — H&P (Signed)
PULMONARY / CRITICAL CARE MEDICINE   Name: Tina Chase MRN: [id number] DOB: 1940-10-20    ADMISSION DATE:  11/06/2015 CONSULTATION DATE:  11/06/2015  REFERRING MD:  Dr. Benjiman CoreNathan Chase (ED physician)  CHIEF COMPLAINT:  Dyspnea w/ exertion x 2 weeks  HISTORY OF PRESENT ILLNESS:  Ms. Tina Chase is a 75 year old woman with PMH of asthma, HLD, HTN, lipedema, OSA, nephrolithiasis s/p nephrectomy (2013) and hx of PE (2013 provoked by recent surgery) here with c/o DOE x 2 weeks.  Denies dyspnea at rest.  Reports occasional sharp chest pain that last a few seconds and resolves spontaneously.  She denies chest pain with breathing and does not feel the sharp chest pain is related to her dyspnea.  She denies recent surgery, prolonged immobilization, travel, cancer hx, stroke or bleeding.  She reports chronic B/L leg edema (lipedema noted in hx) and denies leg pain or unilateral swelling.  She increased her inhaler use (three time daily) to help with her dyspnea and said it was working until this morning.  She also noted feeling lightheaded this AM but no syncope.  Because her dyspnea was not responsive to inhaler this AM she went next door and asked her neighbor to call 911.  She also notes decreased appetite in past two weeks but able to eat and stay hydrated.    She has a past hx of PE in 2013 after protracted hospital stay for SBO due to strangulated hernia requiring small bowel resection.  She has been off of warfarin for several years (completed A/C time-frame for first provoked PE in 2013).  She tolerated warfarin in the past w/o bleeding issues.  She is not aware of any family hx of blood clots.  PAST MEDICAL HISTORY :  She  has a past medical history of Hyperlipidemia; Hypertension; Hyperparathyroidism; Obesity; Hyperglycemia; Hypercalcemia; Asthma; Shortness of breath; Chronic kidney disease; Arthritis; Myocardial infarction (HCC); Pulmonary embolism (HCC); and CHF (congestive heart failure)  (HCC).  PAST SURGICAL HISTORY: She  has past surgical history that includes Tubal ligation; Other surgical history; Laparoscopic nephrectomy (08/19/2011); laparotomy (12/04/2011); Incisional hernia repair (12/04/2011); Application if wound vac (12/04/2011); and Kidney cyst removal.  Allergies  Allergen Reactions  . Hydralazine Anaphylaxis    Swelling tongue.  . Aspirin Other (See Comments)    REACTION: hives  . Crab [Shellfish Allergy] Hives    Can eat shrimp and fish and lobster  . Lisinopril Swelling    REACTION: angioedema Tongue swelling    No current facility-administered medications on file prior to encounter.   Current Outpatient Prescriptions on File Prior to Encounter  Medication Sig  . acetaminophen (TYLENOL) 325 MG tablet Take 325 mg by mouth every 6 (six) hours as needed.  Marland Kitchen. albuterol (PROVENTIL HFA;VENTOLIN HFA) 108 (90 Base) MCG/ACT inhaler Inhale 2 puffs into the lungs every 4 (four) hours as needed for wheezing or shortness of breath.  . allopurinol (ZYLOPRIM) 300 MG tablet Take 300 mg by mouth daily.  Marland Kitchen. amLODipine (NORVASC) 10 MG tablet Take 1 tablet (10 mg total) by mouth daily.  Jae Dire. Elastic Bandages & Supports (MEDICAL COMPRESSION STOCKINGS) MISC Please place on feet as needed for swelling.  . furosemide (LASIX) 20 MG tablet Take 2 tablets (40 mg total) by mouth daily.  Marland Kitchen. loperamide (IMODIUM) 2 MG capsule Take 1 capsule (2 mg total) by mouth daily as needed for diarrhea or loose stools.  . metoprolol 75 MG TABS Take 75 mg by mouth 2 (two) times daily.  . Potassium  Citrate 15 MEQ (1620 MG) TBCR Take 2 tablets by mouth 2 (two) times daily.    FAMILY HISTORY:  Her indicated that her mother is deceased. She indicated that her father is deceased.  She is not aware of any family hx of blood clots.  SOCIAL HISTORY: She  reports that she quit smoking about 47 years ago. Her smoking use included Cigarettes. She has never used smokeless tobacco. She reports that she does not  drink alcohol or use illicit drugs.  REVIEW OF SYSTEMS:   General:  Reports decreased appetite in past two weeks. Pulmonary:  Per HPI Cardiac:  Per HPI GI:  Denies N/V, abdominal pain or change in bowel habits.  Reports intermittent diarrhea since bowel resection in 2013.  GU:  Denies difficult urination, dysuria or hematuria. Neuro:  Denies unilateral weakness or change in vision/hearing  SUBJECTIVE:  DOE x 2 weeks  VITAL SIGNS: BP 154/76 mmHg  Pulse 102  Temp(Src) 97.6 F (36.4 C) (Oral)  Resp 31  Ht  (1.626 m)  Wt 310 lb 8 oz (140.842 kg)  BMI 53.27 kg/m2  SpO2 95%  LMP 09/23/1986   PHYSICAL EXAMINATION: General:  In hospital bed in NAD Neuro:  AAO x 3, responding appropriately, following commands, strength and sensation grossly intact upper/lower ext HEENT:  [place]/AT, PERRL, EOMI, MMM Cardiovascular:  Sinus tachycardia, no murmurs/rubs/gallops Lungs:  Limited by size but clear anteriorly, w/o w/r/r Abdomen:  +BS, soft, NT Extremities:  Non-pitting edema B/L  Skin:  No rashes noted.  LABS:  BMET  Recent Labs Lab 11/06/15 1315  NA 141  K 3.6  CL 102  BUN 13  CREATININE 0.90  GLUCOSE 127*    Electrolytes No results for input(s): CALCIUM, MG, PHOS in the last 168 hours.  CBC  Recent Labs Lab 11/06/15 1310 11/06/15 1315  WBC 9.2  --   HGB 13.7 16.0*  HCT 42.6 47.0*  PLT 224  --     Cardiac Enzymes Troponin 0.07  D-dimer:   > 20.00  Imaging Ct Angio Chest Pe W/cm &/or Wo Cm  11/06/2015  ADDENDUM REPORT: 11/06/2015 15:22 ADDENDUM: Critical Value/emergent results were called by telephone at the time of interpretation on 11/06/2015 at at 1518 hours to Dr. Benjiman Chase , who verbally acknowledged these results. Electronically Signed   By: Odessa Fleming M.D.   On: 11/06/2015 15:22  11/06/2015  CLINICAL DATA:  75 year old female with shortness of breath for several weeks, progressive today. Initial encounter. Personal history of pulmonary embolism and  2013 EXAM: CT ANGIOGRAPHY CHEST WITH CONTRAST TECHNIQUE: Multidetector CT imaging of the chest was performed using the standard protocol during bolus administration of intravenous contrast. Multiplanar CT image reconstructions and MIPs were obtained to evaluate the vascular anatomy. CONTRAST:  100 mL Omnipaque 350. COMPARISON:  Chest radiograph 1244 hours today and earlier. Chest CT 12/18/2011 FINDINGS: Adequate contrast bolus timing in the pulmonary arterial tree. Positive for saddle pulmonary embolus with confluent bilateral hilar, and especially lobar pulmonary thrombus on the right side. RV / LV ratio = 1.9. Chronic cardiomegaly appears stable with no pericardial effusion. Calcified aortic and coronary artery atherosclerosis. No pleural effusion. No mediastinal lymphadenopathy. Major airways are patent. There is a stable chronic 12 mm right hilar pulmonary nodule on series 6, image 29. Areas of mild streaky or ground-glass opacity, but no more confluent pulmonary opacity. Negative visualized liver, spleen, pancreas, and bowel in the upper abdomen. No acute osseous abnormality identified. Review of the MIP images confirms  the above findings. IMPRESSION: 1. Positive for acute PE with CT evidence of right heart strain (RV/LV Ratio = 1.9) consistent with at least submassive (intermediate risk) PE. The presence of right heart strain has been associated with an increased risk of morbidity and mortality. Please activate Code PE by paging [phone]. 2. No pleural effusion or pulmonary infarction. A 12 mm right perihilar lung nodule is stable since 2013 and benign. Electronically Signed: By: Odessa Fleming M.D. On: 11/06/2015 15:15   Dg Chest Portable 1 View  11/06/2015  CLINICAL DATA:  Chest pain/sob for 2 weeks,worse today,hx chf EXAM: PORTABLE CHEST - 1 VIEW COMPARISON:  10/25/2015 FINDINGS: Moderate cardiomegaly. Tortuous atheromatous aorta. Enlarged central pulmonary arteries. No focal infiltrate. Mild central  pulmonary vascular congestion. No effusion.  No pneumothorax. Visualized skeletal structures are unremarkable. IMPRESSION: 1. Cardiomegaly and central pulmonary vascular congestion. Electronically Signed   By: Corlis Leak M.D.   On: 11/06/2015 12:53    STUDIES:  None  CULTURES: None  ANTIBIOTICS: None  SIGNIFICANT EVENTS: CTA reveals intermediate risk PE.  LINES/TUBES: Peripheral IV  DISCUSSION: 75 year old woman with unprovoked PE.  CTA suggest RV strain, however she is hemodynamically stable and feeling better with oxygen supplement.  Heparin gtt started in ED.  ASSESSMENT / PLAN:  PULMONARY A: Acute hypoxia 2/2 PE Asthma, mild intermittent - no sign of exacerbation P:   Continuous oxygen supplementation (SpO2 > 92%) and pulse ox monitoring. Continuous monitoring in ICU. A/C with heparin as below. Albuterol nebs prn.  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 HTN - BP normotensive to slightly elevated. P:  Continue heparin gtt.  No indication for tPa currently given stability. Low threshold to initiate tPA if she becomes hemodynamically unstable. Continuous monitoring in ICU. Hold BP medications for now in the setting of acute PE.  Monitor BP closely in ICU.  RENAL A:   No acute issues. Hx of nephrolithiasis s/p nephrectomy. P:   Continue home allopurinol and potassium citrate for stone ppx.  GASTROINTESTINAL A:   No acute issues. P:   Regular diet.  HEMATOLOGIC A:   No acute issues. P:  Monitor for signs of blood loss and CBC while on heparin.  INFECTIOUS A:   No acute issues. P:     ENDOCRINE A:   No acute issues.   P:     NEUROLOGIC A:   No acute issues. P:     FAMILY  - Updates: no family present.  Patient says she does not want to worry her children who live out of state. I advised that she call and tell them she is in the hospital and she plans to do so.  -  Inter-disciplinary family meet or Palliative Care meeting due by:  11/13/15.   Evelena Peat, DO IMTS, PGY3 11/06/2015, 3:58 PM   Pulmonary and Critical Care Medicine [facility] Pager: [phone]

## 2015-11-06 NOTE — ED Notes (Signed)
Pt transported to CT with this RN 

## 2015-11-06 NOTE — ED Provider Notes (Signed)
CSN: [id number]     Arrival date & time 11/06/15  1209 History   First MD Initiated Contact with Patient 11/06/15 1217     Chief Complaint  Patient presents with  . Shortness of Breath     Patient is a 75 y.o. female presenting with shortness of breath.  Shortness of Breath Associated symptoms: cough   Associated symptoms: no abdominal pain and no chest pain   Patient presents with shortness of breath. Began acutely today. Found to be hypoxic in 70% for EMS. Improved somewhat after breathing treatment and oxygen. States she feels better. No chest pain. She has occasional dry cough. No fevers. No abdominal pain. No swelling or legs.Previous history of pulmonary embolism around 3 years ago after hernia surgery. Not currently on anticoagulation.  Past Medical History  Diagnosis Date  . Hyperlipidemia   . Hypertension   . Hyperparathyroidism   . Obesity   . Hyperglycemia   . Hypercalcemia   . Asthma   . Shortness of breath     wtih exertion   . Chronic kidney disease     right non functioning kidney   . Arthritis     left knee  and left shoulder   . Myocardial infarction (HCC)   . Pulmonary embolism (HCC)     Provoked 2013  . CHF (congestive heart failure) [facility])    Past Surgical History  Procedure Laterality Date  . Tubal ligation    . Other surgical history      D and C x 3   . Laparoscopic nephrectomy  08/19/2011    Procedure: LAPAROSCOPIC NEPHRECTOMY;  Surgeon: Milford Cage, MD;  Location: [facility];  Service: Urology;  Laterality: Right;  . Laparotomy  12/04/2011    Procedure: EXPLORATORY LAPAROTOMY;  Surgeon: Adolph Pollack, MD;  Location: [facility];  Service: General;  Laterality: N/A;   bowel resection   . Incisional hernia repair  12/04/2011    Procedure: HERNIA REPAIR INCISIONAL;  Surgeon: Adolph Pollack, MD;  Location: [facility];  Service: General;  Laterality: N/A;  . Application of wound vac  12/04/2011    Procedure: APPLICATION OF WOUND VAC;  Surgeon: Adolph Pollack, MD;  Location: [facility];  Service: General;;  x two  . Kidney cyst removal     Family History  Problem Relation Age of Onset  . Stroke Neg Hx   . Cancer Neg Hx   . Nephrolithiasis Neg Hx   . Hyperlipidemia Mother   . Hypertension Mother   . Heart disease Father     had a heart attack at age 9  . Heart attack Father 28  . Heart disease Brother     at age 49  . Heart attack Brother 6   Social History  Substance Use Topics  . Smoking status: Former Smoker    Types: Cigarettes    Quit date: 08/03/1968  . Smokeless tobacco: Never Used  . Alcohol Use: No   OB History    No data available     Review of Systems  Constitutional: Negative for appetite change and fatigue.  Respiratory: Positive for cough and shortness of breath.   Cardiovascular: Negative for chest pain.  Gastrointestinal: Negative for abdominal pain.  Musculoskeletal: Negative for back pain.  Skin: Negative for wound.  Neurological: Negative for numbness.      Allergies  Hydralazine; Aspirin; Crab; and Lisinopril  Home Medications   Prior to Admission medications   Medication Sig Start Date End  Date Taking? Authorizing Provider  acetaminophen (TYLENOL) 325 MG tablet Take 325 mg by mouth every 6 (six) hours as needed.    Historical Provider, MD  albuterol (PROVENTIL HFA;VENTOLIN HFA) 108 (90 Base) MCG/ACT inhaler Inhale 2 puffs into the lungs every 4 (four) hours as needed for wheezing or shortness of breath. 09/09/15   Tyson Alias, MD  allopurinol (ZYLOPRIM) 300 MG tablet Take 300 mg by mouth daily. 05/17/15   Historical Provider, MD  amLODipine (NORVASC) 10 MG tablet Take 1 tablet (10 mg total) by mouth daily. 02/14/15   [name], MD  Elastic Bandages & Supports (MEDICAL COMPRESSION STOCKINGS) MISC Please place on feet as needed for swelling. 01/09/14   [name], MD  furosemide (LASIX) 20 MG tablet Take 2 tablets (40 mg total) by mouth daily. 02/14/15 02/25/16  [name],  MD  loperamide (IMODIUM) 2 MG capsule Take 1 capsule (2 mg total) by mouth daily as needed for diarrhea or loose stools. 09/09/15   Tyson Alias, MD  metoprolol 75 MG TABS Take 75 mg by mouth 2 (two) times daily. 07/08/15   Tyson Alias, MD  Potassium Citrate 15 MEQ (1620 MG) TBCR Take 2 tablets by mouth 2 (two) times daily. 01/09/14   [name], MD   BP 154/76 mmHg  Pulse 102  Temp(Src) 97.6 F (36.4 C) (Oral)  Resp 31  Ht  (1.626 m)  Wt 310 lb 8 oz (140.842 kg)  BMI 53.27 kg/m2  SpO2 95%  LMP 09/23/1986 Physical Exam  Constitutional: She appears well-developed.  HENT:  Head: Atraumatic.  Neck: Neck supple.  Cardiovascular:  Mild tachycardia  Pulmonary/Chest: Effort normal. She has no wheezes.  Abdominal: There is no tenderness.  Musculoskeletal: She exhibits no edema.  Neurological: She is alert.  Skin: Skin is warm.    ED Course  Procedures (including critical care time) Labs Review Labs Reviewed  D-DIMER, QUANTITATIVE (NOT AT [facility]) - Abnormal; Notable for the following:    D-Dimer, Quant >20.00 (*)    All other components within normal limits  I-STAT CHEM 8, ED - Abnormal; Notable for the following:    Glucose, Bld 127 (*)    Hemoglobin 16.0 (*)    HCT 47.0 (*)    All other components within normal limits  CBC WITH DIFFERENTIAL/PLATELET  HEPARIN LEVEL (UNFRACTIONATED)  I-STAT TROPOININ, ED    Imaging Review Ct Angio Chest Pe W/cm &/or Wo Cm  11/06/2015  ADDENDUM REPORT: 11/06/2015 15:22 ADDENDUM: Critical Value/emergent results were called by telephone at the time of interpretation on 11/06/2015 at at 1518 hours to Dr. Benjiman Core , who verbally acknowledged these results. Electronically Signed   By: Odessa Fleming M.D.   On: 11/06/2015 15:22  11/06/2015  CLINICAL DATA:  75 year old female with shortness of breath for several weeks, progressive today. Initial encounter. Personal history of pulmonary embolism and 2013 EXAM: CT ANGIOGRAPHY CHEST  WITH CONTRAST TECHNIQUE: Multidetector CT imaging of the chest was performed using the standard protocol during bolus administration of intravenous contrast. Multiplanar CT image reconstructions and MIPs were obtained to evaluate the vascular anatomy. CONTRAST:  100 mL Omnipaque 350. COMPARISON:  Chest radiograph 1244 hours today and earlier. Chest CT 12/18/2011 FINDINGS: Adequate contrast bolus timing in the pulmonary arterial tree. Positive for saddle pulmonary embolus with confluent bilateral hilar, and especially lobar pulmonary thrombus on the right side. RV / LV ratio = 1.9. Chronic cardiomegaly appears stable with no pericardial effusion. Calcified aortic and coronary artery atherosclerosis.  No pleural effusion. No mediastinal lymphadenopathy. Major airways are patent. There is a stable chronic 12 mm right hilar pulmonary nodule on series 6, image 29. Areas of mild streaky or ground-glass opacity, but no more confluent pulmonary opacity. Negative visualized liver, spleen, pancreas, and bowel in the upper abdomen. No acute osseous abnormality identified. Review of the MIP images confirms the above findings. IMPRESSION: 1. Positive for acute PE with CT evidence of right heart strain (RV/LV Ratio = 1.9) consistent with at least submassive (intermediate risk) PE. The presence of right heart strain has been associated with an increased risk of morbidity and mortality. Please activate Code PE by paging [phone]. 2. No pleural effusion or pulmonary infarction. A 12 mm right perihilar lung nodule is stable since 2013 and benign. Electronically Signed: By: Odessa FlemingH  Hall M.D. On: 11/06/2015 15:15   Dg Chest Portable 1 View  11/06/2015  CLINICAL DATA:  Chest pain/sob for 2 weeks,worse today,hx chf EXAM: PORTABLE CHEST - 1 VIEW COMPARISON:  10/25/2015 FINDINGS: Moderate cardiomegaly. Tortuous atheromatous aorta. Enlarged central pulmonary arteries. No focal infiltrate. Mild central pulmonary vascular congestion. No  effusion.  No pneumothorax. Visualized skeletal structures are unremarkable. IMPRESSION: 1. Cardiomegaly and central pulmonary vascular congestion. Electronically Signed   By: Corlis Leak  Hassell M.D.   On: 11/06/2015 12:53   I have personally reviewed and evaluated these images and lab results as part of my medical decision-making.   EKG Interpretation   Date/Time:  Wednesday November 06 2015 12:25:48 EDT Ventricular Rate:  100 PR Interval:  193 QRS Duration: 94 QT Interval:  375 QTC Calculation: 484 R Axis:   103 Text Interpretation:  Sinus tachycardia Low voltage with right axis  deviation Consider anterior infarct Confirmed by Damion Kant  MD, Harrold DonathNATHAN  [phone]([id number]) on 11/06/2015 1:47:01 PM      MDM   Final diagnoses:  Acute saddle pulmonary embolism with acute cor pulmonale (HCC)    Patient with acute onset shortness of breath. Has refractive hypoxia that requires nonrebreather. Blood pressure is maintained however she does have large saddle pulmonary embolism. Started on IV heparin. To be seen by ICU since has elevated RV/LV ratio.  CRITICAL CARE Performed by: Billee CashingPICKERING,Darnice Comrie R. Total critical care time: 30 minutes Critical care time was exclusive of separately billable procedures and treating other patients. Critical care was necessary to treat or prevent imminent or life-threatening deterioration. Critical care was time spent personally by me on the following activities: development of treatment plan with patient and/or surrogate as well as nursing, discussions with consultants, evaluation of patient's response to treatment, examination of patient, obtaining history from patient or surrogate, ordering and performing treatments and interventions, ordering and review of laboratory studies, ordering and review of radiographic studies, pulse oximetry and re-evaluation of patient's condition.     Benjiman CoreNathan Oluwademilade Mckiver, MD 11/06/15 [phone]

## 2015-11-06 NOTE — ED Notes (Signed)
Pt placed on venturi mask at 8L/min. Pt tolerating well, O2 saturation at 92%.

## 2015-11-06 NOTE — Progress Notes (Signed)
ANTICOAGULATION CONSULT NOTE   Pharmacy Consult for heparin Indication: pulmonary embolus  Allergies  Allergen Reactions  . Hydralazine Anaphylaxis    Swelling tongue.  . Aspirin Other (See Comments)    REACTION: hives  . Crab [Shellfish Allergy] Hives    Can eat shrimp and fish and lobster  . Lisinopril Swelling    REACTION: angioedema Tongue swelling    Patient Measurements: Height: 5\' 4"  (162.6 cm) Weight: (!) 310 lb 8 oz (140.842 kg) IBW/kg (Calculated) : 54.7 Heparin Dosing Weight: 90 kg  Vital Signs: Temp: 97.6 F (36.4 C) (04/05 1219) Temp Source: Oral (04/05 1219) BP: 158/86 mmHg (04/05 1445) Pulse Rate: 103 (04/05 1445)  Labs:  Recent Labs  11/06/15 1310 11/06/15 1315  HGB 13.7 16.0*  HCT 42.6 47.0*  PLT 224  --   CREATININE  --  0.90    Estimated Creatinine Clearance: 78.3 mL/min (by C-G formula based on Cr of 0.9).   Medications:  See EMR  Assessment: 75 yo female admitted with SOB, CTA with submassive PE with R heart strain RV / LV 1.9. SCr 0.9, hgb 16, plts wnl. To start heparin infusion.   Goal of Therapy:  Heparin level 0.3-0.7 units/ml Monitor platelets by anticoagulation protocol: Yes   Plan:  -Heparin 5000 units x1 then 1500 units/hr -Daily HL, CBC -First level this evening -Monitor s/sx bleeding   Tina Chase, Tina Macfarlane M 11/06/2015,3:15 PM

## 2015-11-06 NOTE — ED Notes (Signed)
[facility]- EMS called out today for increased shortness of breath. Pt recently admitted for same. Pt initially noted to be 70% on RA. EMS noted wheezes throughout. Given 5mg  of albuterol.

## 2015-11-06 NOTE — ED Notes (Signed)
Pt O2 saturation level initially noted to be 70% on room air. Pt placed on 4L nasal cannula without improvement. Pt placed on 15L NRB mask and O2 saturation level at 96%.

## 2015-11-06 NOTE — ED Notes (Signed)
Critical Care at bedside.  

## 2015-11-06 NOTE — ED Notes (Signed)
Pt continuing to have O2 saturation levels at 88% on 6L nasal cannula. Pt increased to 15L NRB. Pt tolerating this well at this time with O2 at 97%.

## 2015-11-07 ENCOUNTER — Inpatient Hospital Stay (HOSPITAL_COMMUNITY): Payer: Medicare Other

## 2015-11-07 DIAGNOSIS — I2699 Other pulmonary embolism without acute cor pulmonale: Secondary | ICD-10-CM

## 2015-11-07 DIAGNOSIS — J4520 Mild intermittent asthma, uncomplicated: Secondary | ICD-10-CM

## 2015-11-07 DIAGNOSIS — J9601 Acute respiratory failure with hypoxia: Secondary | ICD-10-CM

## 2015-11-07 LAB — HEPARIN LEVEL (UNFRACTIONATED): Heparin Unfractionated: 1.26 IU/mL — ABNORMAL HIGH (ref 0.30–0.70)

## 2015-11-07 LAB — CBC
HCT: 40.8 % (ref 36.0–46.0)
HEMOGLOBIN: 13.4 g/dL (ref 12.0–15.0)
MCH: 30.9 pg (ref 26.0–34.0)
MCHC: 32.8 g/dL (ref 30.0–36.0)
MCV: 94 fL (ref 78.0–100.0)
PLATELETS: 210 10*3/uL (ref 150–400)
RBC: 4.34 MIL/uL (ref 3.87–5.11)
RDW: 14.4 % (ref 11.5–15.5)
WBC: 9.3 10*3/uL (ref 4.0–10.5)

## 2015-11-07 LAB — BASIC METABOLIC PANEL
Anion gap: 11 (ref 5–15)
BUN: 6 mg/dL (ref 6–20)
CHLORIDE: 106 mmol/L (ref 101–111)
CO2: 26 mmol/L (ref 22–32)
CREATININE: 0.79 mg/dL (ref 0.44–1.00)
Calcium: 9.4 mg/dL (ref 8.9–10.3)
GFR calc Af Amer: >60 mL/min (ref >60)
GFR calc non Af Amer: >60 mL/min (ref >60)
Glucose, Bld: 106 mg/dL — ABNORMAL HIGH (ref 65–99)
Potassium: 3.3 mmol/L — ABNORMAL LOW (ref 3.5–5.1)
SODIUM: 143 mmol/L (ref 135–145)

## 2015-11-07 LAB — OCCULT BLOOD X 1 CARD TO LAB, STOOL: Fecal Occult Bld: NEGATIVE

## 2015-11-07 MED ORDER — LOPERAMIDE HCL 2 MG PO CAPS
2.0000 mg | ORAL_CAPSULE | Freq: Every day | ORAL | Status: DC | PRN
  Administered 2015-11-08 – 2015-11-11 (×4): 2 mg via ORAL
  Filled 2015-11-07 (×5): qty 1

## 2015-11-07 MED ORDER — HEPARIN (PORCINE) IN NACL 100-0.45 UNIT/ML-% IJ SOLN
1200.0000 [IU]/h | INTRAMUSCULAR | Status: DC
  Administered 2015-11-07 (×2): 1200 [IU]/h via INTRAVENOUS
  Filled 2015-11-07 (×2): qty 250

## 2015-11-07 MED ORDER — RIVAROXABAN 15 MG PO TABS
15.0000 mg | ORAL_TABLET | Freq: Two times a day (BID) | ORAL | Status: DC
Start: 2015-11-07 — End: 2015-11-11
  Administered 2015-11-07 – 2015-11-11 (×8): 15 mg via ORAL
  Filled 2015-11-07 (×9): qty 1

## 2015-11-07 NOTE — Evaluation (Signed)
Occupational Therapy Evaluation Patient Details Name: Tina Chase MRN: [id number] DOB: 1941/02/06 Today's Date: 11/07/2015    History of Present Illness Pt is a 75 yo female admitted with 3 wk h/o dyspnea, cough.  O2 sats in ER at 70%.  Treated for asthma earlier in week with no improvements.  Pt with PE.  Pt w PE 3 years ago after abdominal surgery.  BLE dopplers scheduled for later this am.     Clinical Impression   Pt admitted with the above diagnosis and has the deficits listed below. Pt overall is doing well with adls but gets very SOB and could benefit from some energy conservation education and practice before she returns home alone.  Pt with O2 sats between 85% and 94% on 4 L of O2 during session.  HR ranged from 110-134 with activity.  Pt did not use O2 at home PTA.  If pt progresses well, she will not need OT follow up at home.     Follow Up Recommendations  Supervision - Intermittent;No OT follow up    Equipment Recommendations  None recommended by OT    Recommendations for Other Services       Precautions / Restrictions Precautions Precautions: Fall Restrictions Weight Bearing Restrictions: No Other Position/Activity Restrictions: watch O2 sats      Mobility Bed Mobility Overal bed mobility: Needs Assistance Bed Mobility: Supine to Sit     Supine to sit: Supervision;HOB elevated     General bed mobility comments: HOB at 40 degrees  Transfers Overall transfer level: Needs assistance Equipment used: 1 person hand held assist Transfers: Sit to/from Stand Sit to Stand: Min guard         General transfer comment: Pt safe with good hand position when transferring. First time up so min guard provided.    Balance Overall balance assessment: Needs assistance Sitting-balance support: Feet supported Sitting balance-Leahy Scale: Good     Standing balance support: During functional activity;No upper extremity supported Standing balance-Leahy Scale:  Fair Standing balance comment: Pt able to stand near chair for adls w/o support for short amounts of time.  Pt becomes fatigued quickly.                            ADL Overall ADL's : Needs assistance/impaired Eating/Feeding: Independent   Grooming: Wash/dry hands;Wash/dry face;Oral care;Set up;Sitting   Upper Body Bathing: Set up;Sitting   Lower Body Bathing: Min guard;Sit to/from stand   Upper Body Dressing : Set up;Sitting   Lower Body Dressing: Minimal assistance;Sit to/from stand Lower Body Dressing Details (indicate cue type and reason): pts sofa at home at perfect level for her to reach her feet. pt needs some assist reaching feet at this time. Toilet Transfer: Min guard;Ambulation;BSC   Toileting- ArchitectClothing Manipulation and Hygiene: Min guard;Sit to/from stand       Functional mobility during ADLs: Min guard General ADL Comments: Pt doing very well with basic adls. Pt does get SOB easily and O2 sats dropped to mid 80s on 4L of O2.       Vision Vision Assessment?: No apparent visual deficits   Perception     Praxis      Pertinent Vitals/Pain Pain Assessment: No/denies pain     Hand Dominance Right   Extremity/Trunk Assessment Upper Extremity Assessment Upper Extremity Assessment: Overall WFL for tasks assessed   Lower Extremity Assessment Lower Extremity Assessment: Defer to PT evaluation   Cervical / Trunk Assessment Cervical /  Trunk Assessment: Normal   Communication Communication Communication: No difficulties   Cognition Arousal/Alertness: Awake/alert Behavior During Therapy: WFL for tasks assessed/performed Overall Cognitive Status: Within Functional Limits for tasks assessed                     General Comments       Exercises       Shoulder Instructions      Home Living Family/patient expects to be discharged to:: Private residence Living Arrangements: Alone Available Help at Discharge: Family;Available  PRN/intermittently;Other (Comment) (brothers live in town and neighbors check in.) Type of Home: Apartment Home Access: Elevator     Home Layout: One level     Bathroom Shower/Tub: Tub/shower unit;Curtain Shower/tub characteristics: Curtain Firefighter: Standard     Home Equipment: Environmental consultant - 2 wheels   Additional Comments: pt on 4L O2 but does not normally use O2 at home.      Prior Functioning/Environment Level of Independence: Independent        Comments: Pt has a car but has not driven in awhile.  Has transportation service to medical appts and family can take her to grocery etc.    OT Diagnosis: Generalized weakness   OT Problem List: Decreased activity tolerance;Impaired balance (sitting and/or standing);Decreased knowledge of use of DME or AE;Obesity   OT Treatment/Interventions: Self-care/ADL training;Energy conservation    OT Goals(Current goals can be found in the care plan section) Acute Rehab OT Goals Patient Stated Goal: to get back to normal. OT Goal Formulation: With patient Time For Goal Achievement: 11/21/15 Potential to Achieve Goals: Good ADL Goals Pt Will Perform Upper Body Dressing: with modified independence;standing Additional ADL Goal #1: Pt will walk to bathroom and toilet with mod I. Additional ADL Goal #2: Pt will state 3 things she can incorporate at home into adl routine to conserve energy.  OT Frequency: Min 2X/week   Barriers to D/C: Decreased caregiver support  Pt lives alone but may be ok to d/c home alone with PRN assist when medically stable.       Co-evaluation PT/OT/SLP Co-Evaluation/Treatment: Yes Reason for Co-Treatment: For patient/therapist safety PT goals addressed during session: Mobility/safety with mobility OT goals addressed during session: ADL's and self-care      End of Session Equipment Utilized During Treatment: Gait belt;Oxygen Nurse Communication: Mobility status;Other (comment) (O2 sats from 86% to 94% on  4L O2 when walking.)  Activity Tolerance: Patient limited by fatigue Patient left: in chair;with call bell/phone within reach   Time: 0835-0903 OT Time Calculation (min): 28 min Charges:  OT General Charges $OT Visit: 1 Procedure OT Evaluation $OT Eval Moderate Complexity: 1 Procedure G-Codes:    Hope Budds 11-17-15, 9:23 AM  [phone]

## 2015-11-07 NOTE — Progress Notes (Signed)
ANTICOAGULATION CONSULT NOTE   Pharmacy Consult for heparin Indication: pulmonary embolus  Allergies  Allergen Reactions  . Hydralazine Anaphylaxis    Swelling tongue.  . Aspirin Other (See Comments)    REACTION: hives  . Crab [Shellfish Allergy] Hives    Can eat shrimp and fish and lobster  . Lisinopril Swelling    REACTION: angioedema Tongue swelling    Patient Measurements: Height: 5\' 4"  (162.6 cm) Weight: (!) 310 lb 8 oz (140.842 kg) IBW/kg (Calculated) : 54.7 Heparin Dosing Weight: 90 kg  Vital Signs: Temp: 98 F (36.7 C) (04/05 2027) Temp Source: Oral (04/05 2027) BP: 135/79 mmHg (04/06 0000) Pulse Rate: 98 (04/06 0100)  Labs:  Recent Labs  11/06/15 1310 11/06/15 1315 11/07/15 0035  HGB 13.7 16.0*  --   HCT 42.6 47.0*  --   PLT 224  --   --   HEPARINUNFRC  --   --  1.26*  CREATININE  --  0.90  --     Estimated Creatinine Clearance: 78.3 mL/min (by C-G formula based on Cr of 0.9).  Assessment: 75 yo female on heparin for submassive PE with R heart strain RV / LV 1.9. Heparin level supratherapeutic (1.26) on 1500 units/hr. RN confirmed that heparin drawn appropriately - from arm opposite where heparin infusing. No bleeding noted.   Goal of Therapy:  Heparin level 0.3-0.7 units/ml Monitor platelets by anticoagulation protocol: Yes   Plan:  Hold heparin x 1 hour then restart at decreased rate of 1200 Will f/u 8 hour heparin level after restart  Christoper Fabianaron Kel Senn, PharmD, BCPS Clinical pharmacist, pager [phone] 11/07/2015,1:28 AM

## 2015-11-07 NOTE — Progress Notes (Signed)
ANTICOAGULATION CONSULT NOTE - Initial Consult  Pharmacy Consult for Xarelto Indication: pulmonary embolus  Allergies  Allergen Reactions  . Hydralazine Anaphylaxis    Swelling tongue.  . Aspirin Other (See Comments)    REACTION: hives  . Crab [Shellfish Allergy] Hives    Can eat shrimp and fish and lobster  . Lisinopril Swelling    REACTION: angioedema Tongue swelling    Patient Measurements: Height: 5\' 4"  (162.6 cm) Weight: (!) 300 lb 14.9 oz (136.5 kg) IBW/kg (Calculated) : 54.7 Vital Signs: Temp: 97.8 F (36.6 C) (04/06 1543) Temp Source: Oral (04/06 1543) BP: 108/93 mmHg (04/06 1100) Pulse Rate: 102 (04/06 1100)  Labs:  Recent Labs  11/06/15 1310 11/06/15 1315 11/07/15 0035 11/07/15 0542  HGB 13.7 16.0*  --  13.4  HCT 42.6 47.0*  --  40.8  PLT 224  --   --  210  HEPARINUNFRC  --   --  1.26*  --   CREATININE  --  0.90  --  0.79    Estimated Creatinine Clearance: 86.4 mL/min (by C-G formula based on Cr of 0.79).   Medical History: Past Medical History  Diagnosis Date  . Hyperlipidemia   . Hypertension   . Hyperparathyroidism   . Obesity   . Hyperglycemia   . Hypercalcemia   . Asthma   . Shortness of breath     wtih exertion   . Chronic kidney disease     right non functioning kidney   . Arthritis     left knee  and left shoulder   . Myocardial infarction (HCC)   . Pulmonary embolism (HCC)     Provoked 2013  . CHF (congestive heart failure) [facility](HCC)    Assessment: 75 year old female on IV heparin for PE. Currently unable to monitor heparin effectively due to inability to obtain accurate levels. Patient's respiratory status has improved and now off mask and on [place]. Discussed case with Dr. Christene Slatese Dios and he has agreed to start Xarelto for now. Case management still looking at cost and will let us know if we need to change therapy. This was discussed with the patient and she has agreed to this change.   Goal of Therapy:  Monitor platelets by  anticoagulation protocol: Yes   Plan:  Start Xarelto 15 mg PO BID x21 days at dinner this PM then decrease to Xarelto 20mg  PO daily.  Stop heparin at the same time Xarelto is given.  Monitor CBC and for signs and symptoms of bleeding.  Follow-up with case management on cost.   Link SnufferJessica Hamed Debella, PharmD, BCPS Clinical Pharmacist [phone] 11/07/2015,4:22 PM

## 2015-11-07 NOTE — Evaluation (Signed)
Physical Therapy Evaluation Patient Details Name: Tina Chase MRN: [id number] DOB: 17-Dec-1940 Today's Date: 11/07/2015   History of Present Illness  Pt is a 75 yo female admitted with 3 wk h/o dyspnea, cough.  O2 sats in ER at 70%.  Treated for asthma earlier in week with no improvements.  Pt with PE.  Pt w PE 3 years ago after abdominal surgery.  BLE dopplers scheduled for later this am.    Clinical Impression  Patient presents with dyspnea on exertion requiring supplemental 02 and fatigue impacting mobility. Pt tolerated ambulation with Min guard assist for safety. Required 1 seated rest break due to SOB. SP02 dropped to mid 80s on 4L/min 02. Encouraged ambulation with RN daily. Will follow acutely to maximize independence and mobility prior to return home.    Follow Up Recommendations No PT follow up;Supervision - Intermittent    Equipment Recommendations  None recommended by PT    Recommendations for Other Services       Precautions / Restrictions Precautions Precautions: Fall Precaution Comments: watch 02 sats Restrictions Weight Bearing Restrictions: No Other Position/Activity Restrictions: watch O2 sats      Mobility  Bed Mobility Overal bed mobility: Needs Assistance Bed Mobility: Supine to Sit     Supine to sit: Supervision;HOB elevated     General bed mobility comments: HOB at 40 degrees, no assist needed. No dizziness.  Transfers Overall transfer level: Needs assistance Equipment used: 1 person hand held assist Transfers: Sit to/from Stand Sit to Stand: Min guard         General transfer comment: Pt safe with good hand position when transferring. First time up so min guard provided for safety. Transferred to chair post ambulation bout.  Ambulation/Gait Ambulation/Gait assistance: Min guard Ambulation Distance (Feet): 100 Feet (+ 75') Assistive device: None Gait Pattern/deviations: Step-through pattern;Decreased stride length;Wide base of support    Gait velocity interpretation: Below normal speed for age/gender General Gait Details: Very slow, mildly unsteady gait. Waddling like gait pattern due to body habitus. HR up to 133 bpm and SP02 ranged from 85-95% on 4L/min 02.  Stairs            Wheelchair Mobility    Modified Rankin (Stroke Patients Only)       Balance Overall balance assessment: Needs assistance Sitting-balance support: Feet supported;No upper extremity supported Sitting balance-Leahy Scale: Good     Standing balance support: During functional activity Standing balance-Leahy Scale: Fair Standing balance comment: Pt able to stand near chair for adls w/o support for short amounts of time.  Pt becomes fatigued quickly.                             Pertinent Vitals/Pain Pain Assessment: No/denies pain    Home Living Family/patient expects to be discharged to:: Private residence Living Arrangements: Alone Available Help at Discharge: Family;Available PRN/intermittently;Other (Comment) (brother lives in town and neighbors check in) Type of Home: Apartment Home Access: Elevator     Home Layout: One level Home Equipment: Walker - 2 wheels Additional Comments: pt on 4L O2 but does not normally use O2 at home.    Prior Function Level of Independence: Independent         Comments: Pt has a car but has not driven in awhile.  Has transportation service to medical appts and family can take her to grocery etc.     Hand Dominance   Dominant Hand: Right  Extremity/Trunk Assessment   Upper Extremity Assessment: Defer to OT evaluation           Lower Extremity Assessment: Generalized weakness      Cervical / Trunk Assessment: Normal  Communication   Communication: No difficulties  Cognition Arousal/Alertness: Awake/alert Behavior During Therapy: WFL for tasks assessed/performed Overall Cognitive Status: Within Functional Limits for tasks assessed                       General Comments General comments (skin integrity, edema, etc.): Pt with LE edema/obesity.    Exercises        Assessment/Plan    PT Assessment Patient needs continued PT services  PT Diagnosis Difficulty walking   PT Problem List Decreased strength;Cardiopulmonary status limiting activity;Decreased activity tolerance;Decreased balance;Decreased mobility  PT Treatment Interventions Balance training;Gait training;Functional mobility training;Therapeutic activities;Therapeutic exercise;Patient/family education   PT Goals (Current goals can be found in the Care Plan section) Acute Rehab PT Goals Patient Stated Goal: to get back to normal. PT Goal Formulation: With patient Time For Goal Achievement: 11/21/15 Potential to Achieve Goals: Good    Frequency Min 3X/week   Barriers to discharge Decreased caregiver support lives alone    Co-evaluation PT/OT/SLP Co-Evaluation/Treatment: Yes Reason for Co-Treatment: For patient/therapist safety PT goals addressed during session: Mobility/safety with mobility OT goals addressed during session: ADL's and self-care       End of Session Equipment Utilized During Treatment: Gait belt;Oxygen Activity Tolerance: Treatment limited secondary to medical complications (Comment) (drop in Sp02) Patient left: in chair;with call bell/phone within reach Nurse Communication: Mobility status         Time: [id number] PT Time Calculation (min) (ACUTE ONLY): 27 min   Charges:   PT Evaluation $PT Eval Moderate Complexity: 1 Procedure     PT G Codes:        Tyrone Balash A Artavius Stearns 11/07/2015, 10:20 AM Mylo RedShauna Nat Lowenthal, PT, DPT [phone]

## 2015-11-07 NOTE — Progress Notes (Signed)
PULMONARY / CRITICAL CARE MEDICINE   Name: Tina Chase MRN: [id number] DOB: 04-27-1941    ADMISSION DATE:  11/06/2015 CONSULTATION DATE:  11/06/2015  REFERRING MD:  Dr. Benjiman CoreNathan Chase (ED physician)  CHIEF COMPLAINT:  Dyspnea w/ exertion x 2 weeks  Brief Hx-  Ms. Tina Chase is a 75 year old woman with PMH of asthma, HLD, HTN, lipedema, OSA, nephrolithiasis s/p nephrectomy (2013) and hx of PE (2013 provoked by recent surgery) here with c/o progressive DOE x 2 weeks, intermittent chest pain. No hx of recent surgery, prolonged immobilization, travel, cancer hx, stroke or bleeding.  Hx of chronic B/L leg edema, denies leg pain or unilateral swelling.   She also noted feeling lightheaded this AM but no syncope.  Hx of PE in 2013 after protracted hospital stay for SBO due to strangulated hernia requiring small bowel resection.  She has been off of warfarin for several years (completed A/C time-frame for first provoked PE in 2013).  She tolerated warfarin in the past w/o bleeding issues.  She is not aware of any family hx of blood clots.  PAST MEDICAL HISTORY :  She  has a past medical history of Hyperlipidemia; Hypertension; Hyperparathyroidism; Obesity; Hyperglycemia; Hypercalcemia; Asthma; Shortness of breath; Chronic kidney disease; Arthritis; Myocardial infarction (HCC); Pulmonary embolism (HCC); and CHF (congestive heart failure) (HCC).  PAST SURGICAL HISTORY: She  has past surgical history that includes Tubal ligation; Other surgical history; Laparoscopic nephrectomy (08/19/2011); laparotomy (12/04/2011); Incisional hernia repair (12/04/2011); Application if wound vac (12/04/2011); and Kidney cyst removal.  SUBJECTIVE:  Feeling well, feels better than when she came in. She has no chest pain. She is on nasal canula.  VITAL SIGNS: BP 124/75 mmHg  Pulse 96  Temp(Src) 97.9 F (36.6 C) (Oral)  Resp 32  Ht 5\' 4"  (1.626 m)  Wt 300 lb 14.9 oz (136.5 kg)  BMI 51.63 kg/m2  SpO2 94%  LMP  09/23/1986  PHYSICAL EXAMINATION: General:  In hospital bed in NAD, awake and alert Neuro:  AAO x 3, responding appropriately, following commands HEENT:  [place]/AT, PERRL, EOMI Cardiovascular:  Sinus tachycardia, no murmurs/rubs/gallops Lungs:  Limited by size but clear anteriorly, No wheezing. Abdomen:  +BS, soft, NT Extremities:  Puffy feet, not pitting  LABS:  BMET  Recent Labs Lab 11/06/15 1315 11/07/15 0542  NA 141 143  K 3.6 3.3*  CL 102 106  CO2  --  26  BUN 13 6  CREATININE 0.90 0.79  GLUCOSE 127* 106*    Electrolytes  Recent Labs Lab 11/07/15 0542  CALCIUM 9.4    CBC  Recent Labs Lab 11/06/15 1310 11/06/15 1315 11/07/15 0542  WBC 9.2  --  9.3  HGB 13.7 16.0* 13.4  HCT 42.6 47.0* 40.8  PLT 224  --  210    Cardiac Enzymes Troponin 0.07  D-dimer:   > 20.00  Imaging Ct Angio Chest Pe W/cm &/or Wo Cm  11/06/2015  ADDENDUM REPORT: 11/06/2015 15:22 ADDENDUM: Critical Value/emergent results were called by telephone at the time of interpretation on 11/06/2015 at at 1518 hours to Dr. Benjiman CoreNATHAN Chase , who verbally acknowledged these results. Electronically Signed   By: Tina Chase M.D.   On: 11/06/2015 15:22  11/06/2015  CLINICAL DATA:  75 year old female with shortness of breath for several weeks, progressive today. Initial encounter. Personal history of pulmonary embolism and 2013 EXAM: CT ANGIOGRAPHY CHEST WITH CONTRAST TECHNIQUE: Multidetector CT imaging of the chest was performed using the standard protocol during bolus administration of intravenous contrast.  Multiplanar CT image reconstructions and MIPs were obtained to evaluate the vascular anatomy. CONTRAST:  100 mL Omnipaque 350. COMPARISON:  Chest radiograph 1244 hours today and earlier. Chest CT 12/18/2011 FINDINGS: Adequate contrast bolus timing in the pulmonary arterial tree. Positive for saddle pulmonary embolus with confluent bilateral hilar, and especially lobar pulmonary thrombus on the right side. RV /  LV ratio = 1.9. Chronic cardiomegaly appears stable with no pericardial effusion. Calcified aortic and coronary artery atherosclerosis. No pleural effusion. No mediastinal lymphadenopathy. Major airways are patent. There is a stable chronic 12 mm right hilar pulmonary nodule on series 6, image 29. Areas of mild streaky or ground-glass opacity, but no more confluent pulmonary opacity. Negative visualized liver, spleen, pancreas, and bowel in the upper abdomen. No acute osseous abnormality identified. Review of the MIP images confirms the above findings. IMPRESSION: 1. Positive for acute PE with CT evidence of right heart strain (RV/LV Ratio = 1.9) consistent with at least submassive (intermediate risk) PE. The presence of right heart strain has been associated with an increased risk of morbidity and mortality. Please activate Code PE by paging [phone]. 2. No pleural effusion or pulmonary infarction. A 12 mm right perihilar lung nodule is stable since 2013 and benign. Electronically Signed: By: Tina Chase M.D. On: 11/06/2015 15:15   Dg Chest Portable 1 View  11/06/2015  CLINICAL DATA:  Chest pain/sob for 2 weeks,worse today,hx chf EXAM: PORTABLE CHEST - 1 VIEW COMPARISON:  10/25/2015 FINDINGS: Moderate cardiomegaly. Tortuous atheromatous aorta. Enlarged central pulmonary arteries. No focal infiltrate. Mild central pulmonary vascular congestion. No effusion.  No pneumothorax. Visualized skeletal structures are unremarkable. IMPRESSION: 1. Cardiomegaly and central pulmonary vascular congestion. Electronically Signed   By: Tina Chase M.D.   On: 11/06/2015 12:53    STUDIES:  None  CULTURES: None  ANTIBIOTICS: None  SIGNIFICANT EVENTS: 11/06/15- Positive for acute PE with CT evidence of right heart strain  LINES/TUBES: Peripheral IV  DISCUSSION: 75 year old woman with unprovoked PE.  CTA suggest RV strain, however she is hemodynamically stable and feeling better with oxygen supplement.  Heparin gtt  started in ED. Desaturated at night and so had to be placed on venti mask.  ASSESSMENT / PLAN:  PULMONARY A: Pulmonary Embolism- submassive. Acute hypoxia 2/2 PE Asthma, mild intermittent - no sign of exacerbation Tachypneic OSA P:   Continuous oxygen supplementation (SpO2 > 92%) and pulse ox monitoring. Continuous monitoring in ICU. A/C with heparin as below. Albuterol nebs prn. Sleep studies needed out-pt Lower extremity dopplers  CARDIOVASCULAR A:  Acute PE - unclear etiology; this is her 2nd PE (first was provoked in 2013 setting of SBO) with Right heart strain HTN - BP normotensive to slightly elevated. TAchycardic P:  Continue heparin gtt.  No indication for tPa currently given stability. Low threshold to initiate tPA if she becomes hemodynamically unstable. Continuous monitoring in ICU. Hold BP medications   Monitor BP closely in ICU. Echo Venous dopplers  RENAL A:   No acute issues. Hx of nephrolithiasis s/p nephrectomy. P:   Continue home allopurinol and potassium citrate for stone ppx.  GASTROINTESTINAL A:   No acute issues. Obesity P:   Regular diet. PPI  HEMATOLOGIC A:   No acute issues. P:  Monitor for signs of blood loss and CBC while on heparin.  INFECTIOUS A:   No acute issues. P:    ENDOCRINE A:   No acute issues.   P:    NEUROLOGIC A:   No acute issues.  P:    FAMILY  - Updates: no family present.  Patient updated about status  - Inter-disciplinary family meet or Palliative Care meeting due by:  11/13/15.   Inocente Salles, MD. Danise Edge, PGY3 11/07/2015, 7:24 AM  [phone]

## 2015-11-07 NOTE — Progress Notes (Signed)
VASCULAR LAB PRELIMINARY  PRELIMINARY  PRELIMINARY  PRELIMINARY  Bilateral lower extremity venous duplex completed.     Bilateral:  No evidence of DVT, superficial thrombosis, or Baker's Cyst.  Exam difficult due to patient habitus.  Jenetta Logesami Annalisse Minkoff, RVT, RDMS 11/07/2015, 4:27 PM

## 2015-11-08 ENCOUNTER — Other Ambulatory Visit (HOSPITAL_COMMUNITY): Payer: Medicare Other

## 2015-11-08 LAB — CBC
HCT: 41.1 % (ref 36.0–46.0)
Hemoglobin: 13 g/dL (ref 12.0–15.0)
MCH: 29.8 pg (ref 26.0–34.0)
MCHC: 31.6 g/dL (ref 30.0–36.0)
MCV: 94.3 fL (ref 78.0–100.0)
PLATELETS: 183 10*3/uL (ref 150–400)
RBC: 4.36 MIL/uL (ref 3.87–5.11)
RDW: 14.5 % (ref 11.5–15.5)
WBC: 9.4 10*3/uL (ref 4.0–10.5)

## 2015-11-08 LAB — BASIC METABOLIC PANEL
ANION GAP: 11 (ref 5–15)
BUN: 8 mg/dL (ref 6–20)
CALCIUM: 9.6 mg/dL (ref 8.9–10.3)
CO2: 25 mmol/L (ref 22–32)
Chloride: 105 mmol/L (ref 101–111)
Creatinine, Ser: 0.74 mg/dL (ref 0.44–1.00)
Glucose, Bld: 105 mg/dL — ABNORMAL HIGH (ref 65–99)
POTASSIUM: 4.2 mmol/L (ref 3.5–5.1)
SODIUM: 141 mmol/L (ref 135–145)

## 2015-11-08 LAB — BRAIN NATRIURETIC PEPTIDE: B NATRIURETIC PEPTIDE 5: 60.8 pg/mL (ref 0.0–100.0)

## 2015-11-08 LAB — TROPONIN I: TROPONIN I: 0.04 ng/mL — AB (ref <0.031)

## 2015-11-08 MED ORDER — PANTOPRAZOLE SODIUM 40 MG IV SOLR: 40.0000 mg | INTRAVENOUS | Status: DC

## 2015-11-08 MED ORDER — RIVAROXABAN 20 MG PO TABS: 20.0000 mg | ORAL_TABLET | Freq: Every day | ORAL | Status: DC

## 2015-11-08 MED ORDER — PANTOPRAZOLE SODIUM 40 MG PO TBEC
40.0000 mg | DELAYED_RELEASE_TABLET | Freq: Every day | ORAL | Status: DC
  Administered 2015-11-08 – 2015-11-12 (×5): 40 mg via ORAL
  Filled 2015-11-08 (×3): qty 1

## 2015-11-08 MED ORDER — IPRATROPIUM BROMIDE 0.02 % IN SOLN
0.5000 mg | Freq: Four times a day (QID) | RESPIRATORY_TRACT | Status: DC
  Administered 2015-11-08 (×2): 0.5 mg via RESPIRATORY_TRACT
  Filled 2015-11-08 (×2): qty 2.5

## 2015-11-08 MED ORDER — BUDESONIDE 0.25 MG/2ML IN SUSP
0.2500 mg | Freq: Two times a day (BID) | RESPIRATORY_TRACT | Status: DC
  Administered 2015-11-08 – 2015-11-12 (×9): 0.25 mg via RESPIRATORY_TRACT
  Filled 2015-11-08 (×9): qty 2

## 2015-11-08 MED ORDER — IPRATROPIUM-ALBUTEROL 0.5-2.5 (3) MG/3ML IN SOLN
3.0000 mL | Freq: Two times a day (BID) | RESPIRATORY_TRACT | Status: DC
  Administered 2015-11-08 – 2015-11-12 (×8): 3 mL via RESPIRATORY_TRACT
  Filled 2015-11-08 (×8): qty 3

## 2015-11-08 MED ORDER — ARFORMOTEROL TARTRATE 15 MCG/2ML IN NEBU: 15.0000 ug | INHALATION_SOLUTION | Freq: Two times a day (BID) | RESPIRATORY_TRACT | Status: DC

## 2015-11-08 NOTE — Progress Notes (Signed)
Physical Therapy Treatment Patient Details Name: Tina BlendRebecca J Level MRN: [id number] DOB: Mar 17, 1941 Today's Date: 11/08/2015    History of Present Illness Pt is a 75 yo female admitted with 3 wk h/o dyspnea, cough.  O2 sats in ER at 70%.  Treated for asthma earlier in week with no improvements.  Pt with PE.  Pt w PE 3 years ago after abdominal surgery.  BLE dopplers scheduled for later this am.      PT Comments    Patient progressing well towards PT goals. Trying to wean pt as appropriate. Pt able to maintain Sp02 >94% on 4L/min 02 however desaturates to 85% on 2-3L/min 02. Encouraged daily ambulation with RN to improve ventilation and help with weaning. Educated pt on importance of performing short bouts of activity with longer rest breaks. Will continue to follow.   Follow Up Recommendations  No PT follow up;Supervision - Intermittent     Equipment Recommendations  None recommended by PT    Recommendations for Other Services       Precautions / Restrictions Precautions Precautions: Fall Precaution Comments: watch 02 sats Restrictions Weight Bearing Restrictions: No    Mobility  Bed Mobility               General bed mobility comments: Up in chair upon PT arrival.   Transfers Overall transfer level: Needs assistance Equipment used: None Transfers: Sit to/from Stand Sit to Stand: Supervision         General transfer comment: Supervision for safety. Stood from Landscape architectchair x2.   Ambulation/Gait Ambulation/Gait assistance: Supervision Ambulation Distance (Feet): 150 Feet (x2 bouts) Assistive device: None Gait Pattern/deviations: Step-through pattern;Wide base of support   Gait velocity interpretation: Below normal speed for age/gender General Gait Details: Slow, steady gait. HR up to 138 bpm. Sp02 stayed >94% on 4L/min 02. On 2-3L/min 02, Sp02 dropped to 85%, cues for pursed lip breathing and numbers improved quickly. Long seated rest break   Stairs             Wheelchair Mobility    Modified Rankin (Stroke Patients Only)       Balance Overall balance assessment: Needs assistance Sitting-balance support: Feet supported;No upper extremity supported Sitting balance-Leahy Scale: Good     Standing balance support: During functional activity Standing balance-Leahy Scale: Fair                      Cognition Arousal/Alertness: Awake/alert Behavior During Therapy: WFL for tasks assessed/performed Overall Cognitive Status: Within Functional Limits for tasks assessed                      Exercises      General Comments General comments (skin integrity, edema, etc.): Daughter present in room.      Pertinent Vitals/Pain Pain Assessment: No/denies pain    Home Living                      Prior Function            PT Goals (current goals can now be found in the care plan section) Progress towards PT goals: Progressing toward goals    Frequency  Min 3X/week    PT Plan Current plan remains appropriate    Co-evaluation             End of Session Equipment Utilized During Treatment: Gait belt;Oxygen Activity Tolerance: Patient tolerated treatment well Patient left: in chair;with call bell/phone within reach;with family/visitor  present     Time: 1610-9604 PT Time Calculation (min) (ACUTE ONLY): 23 min  Charges:  $Gait Training: 8-22 mins $Therapeutic Exercise: 8-22 mins                    G Codes:      Sania Noy A Evalyn Shultis 11/08/2015, 9:55 AM  Mylo Red, PT, DPT [phone]

## 2015-11-08 NOTE — Care Management Note (Addendum)
Case Management Note  Patient Details  Name: [name] Matters MRN: [id number] Date of Birth: [birth year]  Subjective/Objective:       Benefits check:  Xarelto: Tier 3, 25% of total cost of medication, Berkley Harveyauth is requried, [phone]   Eliquis: Tier 3, 25% of total cost of medication, Berkley Harveyauth is required [phone]   Patient still has $156.29 remaining on deductible    Action/Plan:   Expected Discharge Date:                  Expected Discharge Plan:  Home/Self Care  In-House Referral:     Discharge planning Services  CM Consult  Post Acute Care Choice:    Choice offered to:     DME Arranged:    DME Agency:     HH Arranged:    HH Agency:     Status of Service:  In process, will continue to follow  Medicare Important Message Given:    Date Medicare IM Given:    Medicare IM give by:    Date Additional Medicare IM Given:    Additional Medicare Important Message give by:     If discussed at Long Length of Stay Meetings, dates discussed:    Additional Comments:  Lives at home alone, daughter present in room.  Independent prior.  Is on oxygen at this time, is not on oxygen at home.  Usual pharmacy is [facility] [address] [phone]. Attempted to call and see if they could tell co pay, but of course need prescription first.   Pharmacy states that patient considering going home on coumadin, but then will need lovenox probably.  States that she would have 30 day free card initially with copay card also.   Vangie BickerBrown, Uilani Sanville Jane, RN 11/08/2015, 8:17 AM

## 2015-11-08 NOTE — Progress Notes (Signed)
PULMONARY / CRITICAL CARE MEDICINE   Name: Tina Chase MRN: [id number] DOB: 1941/02/14    ADMISSION DATE:  11/06/2015 CONSULTATION DATE:  11/06/2015  REFERRING MD:  Dr. Benjiman Core (ED physician)  CHIEF COMPLAINT:  Dyspnea w/ exertion x 2 weeks  Brief Hx-  Ms. Canevari is a 75 year old woman with PMH of asthma, HLD, HTN, lipedema, OSA, nephrolithiasis s/p nephrectomy (2013) and hx of PE (2013 provoked by recent surgery) here with c/o progressive DOE x 2 weeks, intermittent chest pain. No hx of recent surgery, prolonged immobilization, travel, cancer hx, stroke or bleeding.  Hx of chronic B/L leg edema, denies leg pain or unilateral swelling.   She also noted feeling lightheaded this AM but no syncope.  Hx of PE in 2013 after protracted hospital stay for SBO due to strangulated hernia requiring small bowel resection.  She has been off of warfarin for several years (completed A/C time-frame for first provoked PE in 2013).  She tolerated warfarin in the past w/o bleeding issues.  She is not aware of any family hx of blood clots.  PAST MEDICAL HISTORY :  She  has a past medical history of Hyperlipidemia; Hypertension; Hyperparathyroidism; Obesity; Hyperglycemia; Hypercalcemia; Asthma; Shortness of breath; Chronic kidney disease; Arthritis; Myocardial infarction (HCC); Pulmonary embolism (HCC); and CHF (congestive heart failure) (HCC).  PAST SURGICAL HISTORY: She  has past surgical history that includes Tubal ligation; Other surgical history; Laparoscopic nephrectomy (08/19/2011); laparotomy (12/04/2011); Incisional hernia repair (12/04/2011); Application if wound vac (12/04/2011); and Kidney cyst removal.  SUBJECTIVE:  Wheezing overnight, improved with Albuterol inhaler. She feels much better today. She has no complaints.   VITAL SIGNS: BP 125/87 mmHg  Pulse 104  Temp(Src) 97.9 F (36.6 C) (Oral)  Resp 30  Ht  (1.626 m)  Wt 300 lb 14.9 oz (136.5 kg)  BMI 51.63 kg/m2  SpO2 94%  LMP  09/23/1986  PHYSICAL EXAMINATION: General: In no distress. Neuro:  Moving all extremities, following commands.  HEENT:  Moist oral mucosa, Pupils equal round. Cardiovascular:  Sinus tachycardia, no murmurs/rubs/gallops Lungs:  Mild expiratory wheeze anteriorly Abdomen:  +BS, soft, not tender, obese, midline infra-umb scar. Extremities:  Puffy feet, not pitting  LABS:  BMET  Recent Labs Lab 11/06/15 1315 11/07/15 0542 11/08/15 0300  NA 141 143 141  K 3.6 3.3* 4.2  CL 102 106 105  CO2  --  26 25  BUN CREATININE 0.90 0.79 0.74  GLUCOSE 127* 106* 105*    Electrolytes  Recent Labs Lab 11/07/15 0542 11/08/15 0300  CALCIUM 9.4 9.6    CBC  Recent Labs Lab 11/06/15 1310 11/06/15 1315 11/07/15 0542 11/08/15 0300  WBC 9.2  --  9.3 9.4  HGB 13.7 16.0* 13.4 13.0  HCT 42.6 47.0* 40.8 41.1  PLT 224  --  210 183    Cardiac Enzymes Troponin 0.07  D-dimer:   > 20.00  Imaging CTA- 4/5- Positive for acute PE with CT evidence of right heart strain (RV/LV Ratio = 1.9) consistent with at least submassive (intermediate risk) PE. Chest xray 4/5- Cardiomegaly and central pulmonary vascular congestion.  STUDIES:  Lower Extremity Dopplers- No DVT, no BAkers Cyst. Exam difficult due to patient habitus.  CULTURES: None  ANTIBIOTICS: None  SIGNIFICANT EVENTS: 11/06/15- Positive for acute PE with CT evidence of right heart strain 4/5 Started on heparin drip, >>4/6  4/6- Transitioned to NOACKerin Salen  LINES/TUBES: Peripheral IV  DISCUSSION: 75 year old woman with unprovoked PE.  CTA suggest RV strain, however she is hemodynamically stable and feeling better with oxygen supplement.  Heparin gtt started in ED. Desaturated at night and so had to be placed on venti mask.  ASSESSMENT / PLAN:  PULMONARY A: Pulmonary Embolism- submassive. Acute hypoxia 2/2 PE Asthma, mild intermittent - Mild exacerbation. Tachypneic OSA P:   Continuous oxygen  supplementation (SpO2 > 92%) and pulse ox monitoring. Heparin D/c due to frequent monitoring and pt is a hard stick. Same requirment for monitoring with Lovenox [facility] 4/7 Albuterol nebs prn. Sleep studies needed out-pt Lower extremity dopplers- No DVT. Transfer to tele- IMTS Budesonide  CARDIOVASCULAR A:  Acute PE - unclear etiology; this is her 2nd PE (first was provoked in 2013 setting of SBO) Right heart strain due to PE HTN - BP normotensive to slightly elevated. TAchycardic P:  D/c heparin, cont Xalreto, No indication for tPa currently given stability. Hold BP medications  Echo- Pending Venous dopplers BNP- WNL Trop- 0.04, [facility]  RENAL A:   No acute issues. Hx of nephrolithiasis s/p nephrectomy- 2013 P:   Continue home allopurinol and potassium citrate for stone ppx.  GASTROINTESTINAL A:   No acute issues. Obesity P:   Regular diet. PPI  HEMATOLOGIC A:   No acute issues. Stable Hgb P:  Monitor for signs of blood loss and CBC while on heparin.  INFECTIOUS A:   No acute issues. P:    ENDOCRINE A:   No acute issues.   P:    NEUROLOGIC A:   No acute issues. P:    FAMILY  - Updates: no family present.  Patient updated about status  - Inter-disciplinary family meet or Palliative Care meeting due by:  11/13/15.  Transfer to IMTS.   Inocente SallesEjiro Thompson Mckim, MD. IMTS, PGY3 11/08/2015, 8:16 AM  [phone]

## 2015-11-08 NOTE — Care Management Important Message (Signed)
Important Message  Patient Details  Name: Tina Chase MRN: [id number] Date of Birth: [birth year]   Medicare Important Message Given:  Yes    Kyla BalzarineShealy, Safa Derner Abena 11/08/2015, 11:05 AM

## 2015-11-08 NOTE — Progress Notes (Signed)
Pt to be transferred to 2W per Md order. Report called to Elmarie Shileyiffany, Charity fundraiserN. Pt to be transferred via wheelchair with personal belongings.

## 2015-11-09 ENCOUNTER — Inpatient Hospital Stay (HOSPITAL_COMMUNITY): Payer: Medicare Other

## 2015-11-09 DIAGNOSIS — Z905 Acquired absence of kidney: Secondary | ICD-10-CM

## 2015-11-09 DIAGNOSIS — R911 Solitary pulmonary nodule: Secondary | ICD-10-CM

## 2015-11-09 DIAGNOSIS — J45909 Unspecified asthma, uncomplicated: Secondary | ICD-10-CM

## 2015-11-09 DIAGNOSIS — Z8709 Personal history of other diseases of the respiratory system: Secondary | ICD-10-CM

## 2015-11-09 DIAGNOSIS — Z87442 Personal history of urinary calculi: Secondary | ICD-10-CM

## 2015-11-09 LAB — CBC
HCT: 38 % (ref 36.0–46.0)
Hemoglobin: 12 g/dL (ref 12.0–15.0)
MCH: 29.7 pg (ref 26.0–34.0)
MCHC: 31.6 g/dL (ref 30.0–36.0)
MCV: 94.1 fL (ref 78.0–100.0)
PLATELETS: 214 10*3/uL (ref 150–400)
RBC: 4.04 MIL/uL (ref 3.87–5.11)
RDW: 14.6 % (ref 11.5–15.5)
WBC: 8.6 10*3/uL (ref 4.0–10.5)

## 2015-11-09 LAB — ECHOCARDIOGRAM COMPLETE
Height: 64 in
Weight: 4921.6 oz

## 2015-11-09 NOTE — Progress Notes (Signed)
  Date: 11/09/2015  Patient name: Tina Chase  Medical record number: [id number]  Date of birth: 1941-06-01   This patient has been seen and the plan of care was discussed with the house staff. Please see their note for complete details. I concur with their findings with the following additions/corrections:   [name] came in with DOE and chest pain.  She was found to have submassive PE on the right and was initially started on heparin.  She had a previous clot for which she was on coumadin.  She is being transitioned out of the ICU today.  She stated she is feeling much better.  She is still requiring oxygen.  She is concerned about the price of Xarelto and would rather be on coumadin if Xarelto has a high co-pay.  TTE is pending.  She will likely be able to be discharged in the near future.   Inez CatalinaEmily B Ceirra Belli, MD 11/09/2015, 9:46 PM

## 2015-11-09 NOTE — Progress Notes (Addendum)
Brief Hx- Ms. Tina Chase is a 75 year old woman with PMH of asthma, HLD, HTN, lipedema, OSA, nephrolithiasis s/p nephrectomy (2013) and hx of PE (2013 provoked by recent surgery) here on 11/06/15 with c/o progressive DOE x 2 weeks, intermittent chest pain. No hx of recent surgery, prolonged immobilization, travel, cancer hx, stroke or bleeding. Hx of chronic B/L leg edema, denied leg pain or unilateral swelling.Also reported feeling lightheaded but no syncope. Patient was found to have a R submassive PE, almost saddle embolus. PE was thought to be unprovoked. BP was stable. Troponin 0.04, likely in the setting of R heart strain. BNP was normal. Started on heparin drip. Was on 50% VM > tapered down to 4L O [place]. Patient is being transferred to our service from the ICU today.  She also has a hx of PE in 2013 after protracted hospital stay for SBO due to strangulated hernia requiring small bowel resection.She has been off of warfarin for several years (completed A/C time-frame for first provoked PE in 2013). She tolerated warfarin in the past w/o bleeding issues. She is not aware of any family hx of blood clots.  Subjective: Patient was seen and examined at bedside this morning. Denies having any SOB or chest pain. No other complaints.   Objective: Vital signs in last 24 hours: Filed Vitals:   11/08/15 1938 11/08/15 2011 11/09/15 0358 11/09/15 0726  BP:  147/53 102/66   Pulse:  103 102   Temp:  98.1 F (36.7 C) 98 F (36.7 C)   TempSrc:  Oral Oral   Resp:  18 18   Height:      Weight:   307 lb 9.6 oz (139.526 kg)   SpO2: 93% 93% 90% 93%   Weight change:   Intake/Output Summary (Last 24 hours) at 11/09/15 0835 Last data filed at 11/08/15 1805  Gross per 24 hour  Intake    360 ml  Output      0 ml  Net    360 ml   General: In no acute distress. Neuro: Moving all extremities, following commands.  HEENT: Moist oral mucosa, EOMI. Cardiovascular: Sinus tachycardia, no  murmurs/rubs/gallops Lungs: Mild expiratory wheezes. On 2L O2 via [place].  Abdomen: +BS, soft, not tender, obese, midline infra-umb scar. Extremities: Puffy feet, not pitting  Lab Results: Basic Metabolic Panel:  Recent Labs Lab 11/07/15 0542 11/08/15 0300  NA 143 141  K 3.3* 4.2  CL 106 105  CO2 26 25  GLUCOSE 106* 105*  BUN 6 8  CREATININE 0.79 0.74  CALCIUM 9.4 9.6   CBC:  Recent Labs Lab 11/06/15 1310  11/08/15 0300 11/09/15 0455  WBC 9.2  < > 9.4 8.6  NEUTROABS 6.0  --   --   --   HGB 13.7  < > 13.0 12.0  HCT 42.6  < > 41.1 38.0  MCV 93.0  < > 94.3 94.1  PLT 224  < > 183 214  < > = values in this interval not displayed. Cardiac Enzymes:  Recent Labs Lab 11/08/15 0817  TROPONINI 0.04*    D-Dimer:  Recent Labs Lab 11/06/15 1310  DDIMER >20.00*   CBG:  Recent Labs Lab 11/06/15 1806  GLUCAP 102*   Urine Drug Screen: Drugs of Abuse     Component Value Date/Time   LABOPIA NEG 06/08/2007 2121   COCAINSCRNUR NEG 06/08/2007 2121   LABBENZ NEG 06/08/2007 2121   AMPHETMU NEG 06/08/2007 2121     Micro Results: Recent Results (  from the past 240 hour(s))  MRSA PCR Screening     Status: None   Collection Time: 11/06/15  6:11 PM  Result Value Ref Range Status   MRSA by PCR NEGATIVE NEGATIVE Final    Comment:        The GeneXpert MRSA Assay (FDA approved for NASAL specimens only), is one component of a comprehensive MRSA colonization surveillance program. It is not intended to diagnose MRSA infection nor to guide or monitor treatment for MRSA infections.    Studies/Results: No results found. Medications: I have reviewed the patient's current medications. Scheduled Meds: . allopurinol  300 mg Oral Daily  . budesonide  0.25 mg Nebulization BID  . ipratropium-albuterol  3 mL Nebulization BID  . pantoprazole  40 mg Oral Q1200  . potassium citrate  20 mEq Oral BID  . Rivaroxaban  15 mg Oral BID WC  . [START ON 11/28/2015] rivaroxaban  20 mg  Oral Q supper  . sodium chloride flush  3 mL Intravenous Q12H   Continuous Infusions:  PRN Meds:.acetaminophen **OR** acetaminophen, albuterol, loperamide, sodium chloride flush Assessment/Plan: Principal Problem:   Acute saddle pulmonary embolism with acute cor pulmonale (HCC) Active Problems:   Asthma   Hypertension   Obesity, Class III, BMI 40-49.9 (morbid obesity) (HCC)   Hyperuricemia   Nephrolithiasis   Bilateral lower extremity edema   Acute respiratory failure with hypoxia (HCC)  Acute submassive PE: Patient was admitted on 11/06/15 for a R submassive PE, almost saddle embolus. PE was thought to be unprovoked. This is her 2nd PE (first was provoked in 2013 setting of SBO). BP was stable. Troponin 0.04, likely from [facility] strain. BNP was normal. She was started on heparin drip. Was on 50% VM > tapered down to 4L O [place]. Lower extremity dopplers done 11/07/15 showing no evidence of DVT, superficial thrombosis, or Baker's Cyst. Patient is feeling well today and denies having any SOB or CP. However, requiring 2L O2 via [place].  She was transitioned from heparin drip to Xarelto on 4/7.  - Monitor on tele  - Continuous oxygen supplementation (SpO2 > 92%) - Continue Xalreto - Echo pending    Asthma: Mild expiratory wheezing on exam today.  - Albuterol nebs prn. - Budesonide  Lung nodule: CT from 11/06/15 showing a 12 mm R perihilar lung nodule, stable since 2013 and benign.  - Pt may need PET/ Biopsy as outpatient   Hx of OSA -Sleep studies needed out-pt  Hx of nephrolithiasis s/p nephrectomy- 2013 - Continue home allopurinol and potassium citrate for stone ppx.  GI ppx: PPI  DVT/PE ppx: Xarelto   Dispo: Disposition is deferred at this time, awaiting improvement of current medical problems.  Anticipated discharge in approximately 1-2 day(s).   The patient does have a current PCP Para March Orma Flaming, MD) and does need an [facility] hospital follow-up appointment after discharge.  The patient  does not have transportation limitations that hinder transportation to clinic appointments.  .Services Needed at time of discharge: Y = Yes, Blank = No PT:   OT:   RN:   Equipment:   Other:     LOS: 3 days   John Giovanni, MD 11/09/2015, 8:35 AM

## 2015-11-09 NOTE — Progress Notes (Signed)
  Echocardiogram 2D Echocardiogram has been performed.  Tina Chase, Leasa Kincannon 11/09/2015, 10:20 AM

## 2015-11-09 NOTE — Progress Notes (Signed)
Physical Therapy Treatment Patient Details Name: Tina BlendRebecca J Pata MRN: [id number] DOB: November 08, 1940 Today's Date: 11/09/2015    History of Present Illness Pt is a 75 yo female admitted with 3 wk h/o dyspnea, cough.  O2 sats in ER at 70%.  Treated for asthma earlier in week with no improvements.  Pt with PE.  Pt w PE 3 years ago after abdominal surgery.  BLE dopplers negative.    PT Comments    Patient progressing well towards PT goals. Pt with dyspnea on exertion and drop in Sp02 with mobility to 83% on 1L/min 02 and mid 80s on 2-3L/min 02. HR up to 137 bpm with ambulation. Education on pursed lip breathing. Encouraged ambulation 2 more times today to attempt to wean 02 and improve ventilation/saturations. Will follow acutely.   Follow Up Recommendations  No PT follow up;Supervision - Intermittent     Equipment Recommendations  None recommended by PT    Recommendations for Other Services       Precautions / Restrictions Precautions Precautions: Fall Precaution Comments: watch 02 sats Restrictions Weight Bearing Restrictions: No    Mobility  Bed Mobility               General bed mobility comments: Up in chair upon PT arrival.   Transfers Overall transfer level: Needs assistance Equipment used: None Transfers: Sit to/from Stand Sit to Stand: Modified independent (Device/Increase time)         General transfer comment: Stood from chair x2. No assist needed.   Ambulation/Gait Ambulation/Gait assistance: Supervision Ambulation Distance (Feet): 200 Feet (x2 bouts) Assistive device: None Gait Pattern/deviations: Step-through pattern;Decreased stride length;Wide base of support   Gait velocity interpretation: Below normal speed for age/gender General Gait Details: Steady gait. Sp02 dropped to 83% on 1L/min 02. Dropped to 86% on 3L/min 02. Resolved to 91% with rest and cues for pursed lip breathing. Long seated rest break. HR up to 137 bpm.   Stairs             Wheelchair Mobility    Modified Rankin (Stroke Patients Only)       Balance Overall balance assessment: Needs assistance Sitting-balance support: Feet supported;No upper extremity supported Sitting balance-Leahy Scale: Good     Standing balance support: During functional activity Standing balance-Leahy Scale: Fair                      Cognition Arousal/Alertness: Awake/alert Behavior During Therapy: WFL for tasks assessed/performed Overall Cognitive Status: Within Functional Limits for tasks assessed                      Exercises      General Comments        Pertinent Vitals/Pain Pain Assessment: No/denies pain    Home Living                      Prior Function            PT Goals (current goals can now be found in the care plan section) Progress towards PT goals: Progressing toward goals    Frequency  Min 3X/week    PT Plan Current plan remains appropriate    Co-evaluation             End of Session Equipment Utilized During Treatment: Gait belt;Oxygen Activity Tolerance: Treatment limited secondary to medical complications (Comment) (drop in SP02 with ambulation) Patient left: in chair;with call bell/phone within reach;with family/visitor present  Time: 1610-9604 PT Time Calculation (min) (ACUTE ONLY): 20 min  Charges:  $Therapeutic Exercise: 8-22 mins                    G Codes:      Corynne Scibilia A Steve Youngberg 11/09/2015, 2:11 PM Mylo Red, PT, DPT [phone]

## 2015-11-10 MED ORDER — RIVAROXABAN 15 MG PO TABS: 15.0000 mg | ORAL_TABLET | Freq: Two times a day (BID) | ORAL | Status: DC

## 2015-11-10 MED ORDER — RIVAROXABAN 20 MG PO TABS: 20.0000 mg | ORAL_TABLET | Freq: Every day | ORAL | Status: DC

## 2015-11-10 MED ORDER — BUDESONIDE 0.25 MG/2ML IN SUSP: 0.2500 mg | Freq: Two times a day (BID) | RESPIRATORY_TRACT | Status: DC

## 2015-11-10 NOTE — Progress Notes (Signed)
  Date: 11/10/2015  Patient name: Tina Chase  Medical record number: [id number]  Date of birth: 05-Jun-1941   This patient has been seen and the plan of care was discussed with the house staff. Please see their note for complete details. I concur with their findings.  Can likely be discharged today.   Inez CatalinaEmily B Mullen, MD 11/10/2015, 12:18 PM

## 2015-11-10 NOTE — Progress Notes (Signed)
Subjective: Patient was seen and examined at bedside this morning. No acute overnight events. Denies having any SOB or chest pain. No other complaints.   Objective: Vital signs in last 24 hours: Filed Vitals:   11/10/15 0355 11/10/15 0815 11/10/15 0930 11/10/15 0934  BP: 107/65  122/75   Pulse: 98  107   Temp: 97.8 F (36.6 C)  97.8 F (36.6 C)   TempSrc: Oral  Oral   Resp: 20  18   Height:      Weight: 310 lb 1.6 oz (140.66 kg)     SpO2: 96% 92% 100% 99%   Weight change: 2 lb 8 oz (1.134 kg)  Intake/Output Summary (Last 24 hours) at 11/10/15 1016 Last data filed at 11/10/15 0533  Gross per 24 hour  Intake      0 ml  Output    700 ml  Net   -700 ml   General: In no acute distress. Neuro: Moving all extremities, following commands.  HEENT: Moist oral mucosa, EOMI. Cardiovascular: Sinus tachycardia, no murmurs/rubs/gallops Lungs: Lungs CTAB. No wheezes, rales, or rhonchi.  Abdomen: obese, +BS, soft, not tender, non-distended Extremities: Puffy feet, not pitting  Lab Results: Basic Metabolic Panel:  Recent Labs Lab 11/07/15 0542 11/08/15 0300  NA 143 141  K 3.3* 4.2  CL 106 105  CO2 26 25  GLUCOSE 106* 105*  BUN 6 8  CREATININE 0.79 0.74  CALCIUM 9.4 9.6   CBC:  Recent Labs Lab 11/06/15 1310  11/08/15 0300 11/09/15 0455  WBC 9.2  < > 9.4 8.6  NEUTROABS 6.0  --   --   --   HGB 13.7  < > 13.0 12.0  HCT 42.6  < > 41.1 38.0  MCV 93.0  < > 94.3 94.1  PLT 224  < > 183 214  < > = values in this interval not displayed. Cardiac Enzymes:  Recent Labs Lab 11/08/15 0817  TROPONINI 0.04*    D-Dimer:  Recent Labs Lab 11/06/15 1310  DDIMER >20.00*   CBG:  Recent Labs Lab 11/06/15 1806  GLUCAP 102*   Urine Drug Screen: Drugs of Abuse     Component Value Date/Time   LABOPIA NEG 06/08/2007 2121   COCAINSCRNUR NEG 06/08/2007 2121   LABBENZ NEG 06/08/2007 2121   AMPHETMU NEG 06/08/2007 2121     Micro Results: Recent Results (from  the past 240 hour(s))  MRSA PCR Screening     Status: None   Collection Time: 11/06/15  6:11 PM  Result Value Ref Range Status   MRSA by PCR NEGATIVE NEGATIVE Final    Comment:        The GeneXpert MRSA Assay (FDA approved for NASAL specimens only), is one component of a comprehensive MRSA colonization surveillance program. It is not intended to diagnose MRSA infection nor to guide or monitor treatment for MRSA infections.    Studies/Results: No results found. Medications: I have reviewed the patient's current medications. Scheduled Meds: . allopurinol  300 mg Oral Daily  . budesonide  0.25 mg Nebulization BID  . ipratropium-albuterol  3 mL Nebulization BID  . pantoprazole  40 mg Oral Q1200  . potassium citrate  20 mEq Oral BID  . Rivaroxaban  15 mg Oral BID WC  . [START ON 11/28/2015] rivaroxaban  20 mg Oral Q supper  . sodium chloride flush  3 mL Intravenous Q12H   Continuous Infusions:  PRN Meds:.acetaminophen **OR** acetaminophen, albuterol, loperamide, sodium chloride flush Assessment/Plan: Principal Problem:  Acute saddle pulmonary embolism with acute cor pulmonale (HCC) Active Problems:   Asthma   Hypertension   Obesity, Class III, BMI 40-49.9 (morbid obesity) (HCC)   Hyperuricemia   Nephrolithiasis   Bilateral lower extremity edema   Acute respiratory failure with hypoxia (HCC)  Acute submassive PE: Patient was admitted on 11/06/15 for a R submassive PE, almost saddle embolus. PE was thought to be unprovoked. This is her 2nd PE (first was provoked in 2013 setting of SBO). BP was stable. [facility] BNP was normal. She was started on heparin drip. Was on 50% VM > tapered down to 4L O [place]. Lower extremity dopplers done 11/07/15 showing no evidence of DVT. Echo done 11/09/15 showing LVEF 55-60%, mild LVH, grade 1 diastolic dysfunction, RV pressure overload, mild RA dilation, mild RV dilation, moderate-severe tricuspid regurgitation, PA peak pressure  increased at 87 mmHg, and a trivial pericardial effusion. Patient is feeling well today and denies having any SOB or CP. She was transitioned from heparin drip to Xarelto on 4/7.  - Monitor on tele  - Continue Kerin SalenXalreto - Patient will need echo repeated as outpatient   Asthma: Lungs CTAB, no wheezes heard.   - Albuterol nebs prn. - Budesonide  Lung nodule: CT from 11/06/15 showing a 12 mm R perihilar lung nodule, stable since 2013 and benign.  - Pt may need PET/ Biopsy as outpatient   Hx of OSA -Sleep studies needed out-pt  Hx of nephrolithiasis s/p nephrectomy- 2013 - Continue home allopurinol and potassium citrate for stone ppx.  GI ppx: PPI  DVT/PE ppx: Xarelto   Dispo: Disposition is deferred at this time, awaiting improvement of current medical problems.  Anticipated discharge in approximately 0-1 day(s).   The patient does have a current PCP Para March(Duncan Orma Flaminghomas Vincent, MD) and [facility].  The patient does not have transportation limitations that hinder transportation to clinic appointments.  .Services Needed at time of discharge: Y = Yes, Blank = No PT:   OT:   RN:   Equipment:   Other:     LOS: 4 days   John GiovanniVasundhra Montrez [place], MD 11/10/2015, 10:16 AM

## 2015-11-10 NOTE — Discharge Instructions (Addendum)
Continue using Lovenox twice daily as instructed.  Continue taking Coumadin as instructed.  Please go to [facility] on Friday 11/15/15 between 7:30 am - 4:30 pm for INR check.  They are located on: [address] [place], [place] [address]  Also go the [facility] on Monday 11/18/15 at 10:30 to see Dr. Michaell Cowing for an INR check.      Pulmonary Embolism A pulmonary embolism (PE) is a sudden blockage or decrease of blood flow in one lung or both lungs. Most blockages come from a blood clot that travels from the legs or the pelvis to the lungs. PE is a dangerous and potentially life-threatening condition if it is not treated right away. CAUSES A pulmonary embolism occurs most commonly when a blood clot travels from one of your veins to your lungs. Rarely, PE is caused by air, fat, amniotic fluid, or part of a tumor traveling through your veins to your lungs. RISK FACTORS A PE is more likely to develop in:  People who smoke.  People who areolder, especially over 61 years of age.  People who are overweight (obese).  People who sit or lie still for a long time, such as during long-distance travel (over 4 hours), bed rest, hospitalization, or during recovery from certain medical conditions like a stroke.  People who do not engage in much physical activity (sedentary lifestyle).  People who have chronic breathing disorders.  People whohave a personal or family history of blood clots or blood clotting disease.  People whohave peripheral vascular disease (PVD), diabetes, or some types of cancer.  People who haveheart disease, especially if the person had a recent heart attack or has congestive heart failure.  People who have neurological diseases that affect the legs (leg paresis).  People who have had a traumatic injury, such as breaking a hip or leg.  People whohave recently had major or lengthy surgery, especially on the hip, knee, or  abdomen.  People who have hada central line placed inside a large vein.  People who takemedicines that contain the hormone estrogen. These include birth control pills and hormone replacement therapy.  Pregnancy or during childbirth or the postpartum period. SIGNS AND SYMPTOMS  The symptoms of a PE usually start suddenly and include:  Shortness of breath while active or at rest.  Coughing or coughing up blood or blood-tinged mucus.  Chest pain that is often worse with deep breaths.  Rapid or irregular heartbeat.  Feeling light-headed or dizzy.  Fainting.  Feelinganxious.  Sweating. There may also be pain and swelling in a leg if that is where the blood clot started. These symptoms may represent a serious problem that is an emergency. Do not wait to see if the symptoms will go away. Get medical help right away. Call your local emergency services (911 in the [place] Do not drive yourself to the hospital. DIAGNOSIS Your health care provider will take a medical history and perform a physical exam. You may also have other tests, including:  Blood tests to assess the clotting properties of your blood, assess oxygen levels in your blood, and find blood clots.  Imaging tests, such as CT, ultrasound, MRI, X-ray, and other tests to see if you have clots anywhere in your body.  An electrocardiogram (ECG) to look for heart strain from blood clots in the lungs. TREATMENT The main goals of PE treatment are:  To stop a blood clot from growing larger.  To stop new blood clots from  forming. The type of treatment that you receive depends on many factors, such as the cause of your PE, your risk for bleeding or developing more clots, and other medical conditions that you have. Sometimes, a combination of treatments is necessary. This condition may be treated with:  Medicines, including newer oral blood thinners (anticoagulants), warfarin, low molecular weight heparins, thrombolytics, or  heparins.  Wearing compression stockings or using different types of devices.  Surgery (rare) to remove the blood clot or to place a filter in your abdomen to stop the blood clot from traveling to your lungs. Treatments for a PE are often divided into immediate treatment, long-term treatment (up to 3 months after PE), and extended treatment (more than 3 months after PE). Your treatment may continue for several months. This is called maintenance therapy, and it is used to prevent the forming of new blood clots. You can work with your health care provider to choose the treatment program that is best for you. What are anticoagulants? Anticoagulants are medicines that treat PEs. They can stop current blood clots from growing and stop new clots from forming. They cannot dissolve existing clots. Your body dissolves clots by itself over time. Anticoagulants are given by mouth, by injection, or through an IV tube. What are thrombolytics? Thrombolytics are clot-dissolving medicines that are used to dissolve a PE. They carry a high risk of bleeding, so they tend to be used only in severe cases or if you have very low blood pressure. HOME CARE INSTRUCTIONS If you are taking a newer oral anticoagulant:  Take the medicine every single day at the same time each day.  Understand what foods and drugs interact with this medicine.  Understand that there are no regular blood tests required when using this medicine.  Understandthe side effects of this medicine, including excessive bruising or bleeding. Ask your health care provider or pharmacist about other possible side effects. If you are taking warfarin:  Understand how to take warfarin and know which foods can affect how warfarin works in Public relations account executiveyour body.  Understand that it is dangerous to taketoo much or too little warfarin. Too much warfarin increases the risk of bleeding. Too little warfarin continues to allow the risk for blood clots.  Follow your PT  and INR blood testing schedule. The PT and INR results allow your health care provider to adjust your dose of warfarin. It is very important that you have your PT and INR tested as often as told by your health care provider.  Avoid major changes in your diet, or tell your health care provider before you change your diet. Arrange a visit with a registered dietitian to answer your questions. Many foods, especially foods that are high in vitamin K, can interfere with warfarin and affect the PT and INR results. Eat a consistent amount of foods that are high in vitamin K, such as:  Spinach, kale, broccoli, cabbage, collard greens, turnip greens, Brussels sprouts, peas, cauliflower, seaweed, and parsley.  Beef liver and pork liver.  Green tea.  Soybean oil.  Tell your health care provider about any and all medicines, vitamins, and supplements that you take, including aspirin and other over-the-counter anti-inflammatory medicines. Be especially cautious with aspirin and anti-inflammatory medicines. Do not take those before you ask your health care provider if it is safe to do so. This is important because many medicines can interfere with warfarin and affect the PT and INR results.  Do not start or stop taking any over-the-counter or prescription  medicine unless your health care provider or pharmacist tells you to do so. If you take warfarin, you will also need to do these things:  Hold pressure over cuts for longer than usual.  Tell your dentist and other health care providers that you are taking warfarin before you have any procedures in which bleeding may occur.  Avoid alcohol or drink very small amounts. Tell your health care provider if you change your alcohol intake.  Do not use tobacco products, including cigarettes, chewing tobacco, and e-cigarettes. If you need help quitting, ask your health care provider.  Avoid contact sports. General Instructions  Take over-the-counter and  prescription medicines only as told by your health care provider. Anticoagulant medicines can have side effects, including easy bruising and difficulty stopping bleeding. If you are prescribed an anticoagulant, you will also need to do these things:  Hold pressure over cuts for longer than usual.  Tell your dentist and other health care providers that you are taking anticoagulants before you have any procedures in which bleeding may occur.  Avoid contact sports.  Wear a medical alert bracelet or carry a medical alert card that says you have had a PE.  Ask your health care provider how soon you can go back to your normal activities. Stay active to prevent new blood clots from forming.  Make sure to exercise while traveling or when you have been sitting or standing for a long period of time. It is very important to exercise. Exercise your legs by walking or by tightening and relaxing your leg muscles often. Take frequent walks.  Wear compression stockings as told by your health care provider to help prevent more blood clots from forming.  Do not use tobacco products, including cigarettes, chewing tobacco, and e-cigarettes. If you need help quitting, ask your health care provider.  Keep all follow-up appointments with your health care provider. This is important. PREVENTION Take these actions to decrease your risk of developing another PE:  Exercise regularly. For at least 30 minutes every day, engage in:  Activity that involves moving your arms and legs.  Activity that encourages good blood flow through your body by increasing your heart rate.  Exercise your arms and legs every hour during long-distance travel (over 4 hours). Drink plenty of water and avoid drinking alcohol while traveling.  Avoid sitting or lying in bed for long periods of time without moving your legs.  Maintain a weight that is appropriate for your height. Ask your health care provider what weight is healthy for  you.  If you are a woman who is over 69 years of age, avoid unnecessary use of medicines that contain estrogen. These include birth control pills.  Do not smoke, especially if you take estrogen medicines. If you need help quitting, ask your health care provider.  If you are at very high risk for PE, wear compression stockings.  If you recently had a PE, have regularly scheduled ultrasound testing on your legs to check for new blood clots. If you are hospitalized, prevention measures may include:  Early walking after surgery, as soon as your health care provider says that it is safe.  Receiving anticoagulants to prevent blood clots. If you cannot take anticoagulants, other options may be available, such as wearing compression stockings or using different types of devices. SEEK IMMEDIATE MEDICAL CARE IF:  You have new or increased pain, swelling, or redness in an arm or leg.  You have numbness or tingling in an arm or leg.  You have shortness of breath while active or at rest.  You have chest pain.  You have a rapid or irregular heartbeat.  You feel light-headed or dizzy.  You cough up blood.  You notice blood in your vomit, bowel movement, or urine.  You have a fever. These symptoms may represent a serious problem that is an emergency. Do not wait to see if the symptoms will go away. Get medical help right away. Call your local emergency services (911 in the [place] Do not drive yourself to the hospital.   This information is not intended to replace advice given to you by your health care provider. Make sure you discuss any questions you have with your health care provider.   Document Released: 07/17/2000 Document Revised: 04/10/2015 Document Reviewed: 11/14/2014 Elsevier Interactive Patient Education [employer]  Information on my medicine - Coumadin   (Warfarin)  This medication education was reviewed with me or my healthcare representative as part of my discharge  preparation.  The pharmacist that spoke with me during my hospital stay was:  Severiano Gilbert, [facility]  Why was Coumadin prescribed for you? Coumadin was prescribed for you because you have a blood clot or a medical condition that can cause an increased risk of forming blood clots. Blood clots can cause serious health problems by blocking the flow of blood to the heart, lung, or brain. Coumadin can prevent harmful blood clots from forming. As a reminder your indication for Coumadin is:   Pulmonary Embolism Treatment  What test will check on my response to Coumadin? While on Coumadin (warfarin) you will need to have an INR test regularly to ensure that your dose is keeping you in the desired range. The INR (international normalized ratio) number is calculated from the result of the laboratory test called prothrombin time (PT).  If an INR APPOINTMENT HAS NOT ALREADY BEEN MADE FOR YOU please schedule an appointment to have this lab work done by your health care provider within 7 days. Your INR goal is usually a number between:  2 to 3 or your provider may give you a more narrow range like 2-2.5.  Ask your health care provider during an office visit what your goal INR is.  What  do you need to  know  About  COUMADIN? Take Coumadin (warfarin) exactly as prescribed by your healthcare provider about the same time each day.  DO NOT stop taking without talking to the doctor who prescribed the medication.  Stopping without other blood clot prevention medication to take the place of Coumadin may increase your risk of developing a new clot or stroke.  Get refills before you run out.  What do you do if you miss a dose? If you miss a dose, take it as soon as you remember on the same day then continue your regularly scheduled regimen the next day.  Do not take two doses of Coumadin at the same time.  Important Safety Information A possible side effect of Coumadin (Warfarin) is an increased risk of bleeding. You  should call your healthcare provider right away if you experience any of the following: ? Bleeding from an injury or your nose that does not stop. ? Unusual colored urine (red or dark brown) or unusual colored stools (red or black). ? Unusual bruising for unknown reasons. ? A serious fall or if you hit your head (even if there is no bleeding).  Some foods or medicines interact with Coumadin (warfarin) and might alter your  response to warfarin. To help avoid this: ? Eat a balanced diet, maintaining a consistent amount of Vitamin K. ? Notify your provider about major diet changes you plan to make. ? Avoid alcohol or limit your intake to 1 drink for women and 2 drinks for men per day. (1 drink is 5 oz. wine, 12 oz. beer, or 1.5 oz. liquor.)  Make sure that ANY health care provider who prescribes medication for you knows that you are taking Coumadin (warfarin).  Also make sure the healthcare provider who is monitoring your Coumadin knows when you have started a new medication including herbals and non-prescription products.  Coumadin (Warfarin)  Major Drug Interactions  Increased Warfarin Effect Decreased Warfarin Effect  Alcohol (large quantities) Antibiotics (esp. Septra/Bactrim, Flagyl, Cipro) Amiodarone (Cordarone) Aspirin (ASA) Cimetidine (Tagamet) Megestrol (Megace) NSAIDs (ibuprofen, naproxen, etc.) Piroxicam (Feldene) Propafenone (Rythmol SR) Propranolol (Inderal) Isoniazid (INH) Posaconazole (Noxafil) Barbiturates (Phenobarbital) Carbamazepine (Tegretol) Chlordiazepoxide (Librium) Cholestyramine (Questran) Griseofulvin Oral Contraceptives Rifampin Sucralfate (Carafate) Vitamin K   Coumadin (Warfarin) Major Herbal Interactions  Increased Warfarin Effect Decreased Warfarin Effect  Garlic Ginseng Ginkgo biloba Coenzyme Q10 Green tea St. Johns wort    Coumadin (Warfarin) FOOD Interactions  Eat a consistent number of servings per week of foods HIGH in Vitamin K (1  serving =  cup)  Collards (cooked, or boiled & drained) Kale (cooked, or boiled & drained) Mustard greens (cooked, or boiled & drained) Parsley *serving size only =  cup Spinach (cooked, or boiled & drained) Swiss chard (cooked, or boiled & drained) Turnip greens (cooked, or boiled & drained)  Eat a consistent number of servings per week of foods MEDIUM-HIGH in Vitamin K (1 serving = 1 cup)  Asparagus (cooked, or boiled & drained) Broccoli (cooked, boiled & drained, or raw & chopped) Brussel sprouts (cooked, or boiled & drained) *serving size only =  cup Lettuce, raw (green leaf, endive, romaine) Spinach, raw Turnip greens, raw & chopped   These websites have more information on Coumadin (warfarin):  http://www.king-russell.com/; https://www.hines.net/;

## 2015-11-10 NOTE — Progress Notes (Signed)
Pt ambulated [id number] on roomair in the hallway, 02 sat was 100 when pt started walking, along the way it droped to about 65%, pt was instructed to do pursed lip breathing and her O2 sat increased to 98%. HR  Increased to 124 during ambulation. Pt reported no SOB, will continue to monitor.

## 2015-11-10 NOTE — Progress Notes (Signed)
CM received call from MD to please give pt free trial Xarelto card.  CM gave pt 30 day free trial card and pt verbalized understanding this card will cover today's cost for discharge prescription and giv e enough time for her insurance to authorize the refills.  Pt is now on room air and states she doesn't need home O2.  No other CM needs were communicated.

## 2015-11-11 DIAGNOSIS — Z7901 Long term (current) use of anticoagulants: Secondary | ICD-10-CM

## 2015-11-11 DIAGNOSIS — I2690 Septic pulmonary embolism without acute cor pulmonale: Secondary | ICD-10-CM

## 2015-11-11 MED ORDER — FUROSEMIDE 40 MG PO TABS
40.0000 mg | ORAL_TABLET | Freq: Every day | ORAL | Status: DC
  Administered 2015-11-11 – 2015-11-12 (×2): 40 mg via ORAL
  Filled 2015-11-11 (×2): qty 1

## 2015-11-11 MED ORDER — WARFARIN - PHARMACIST DOSING INPATIENT: Freq: Every day | Status: DC

## 2015-11-11 MED ORDER — ENOXAPARIN SODIUM 150 MG/ML ~~LOC~~ SOLN
1.0000 mg/kg | Freq: Two times a day (BID) | SUBCUTANEOUS | Status: DC
  Administered 2015-11-11 – 2015-11-12 (×3): 140 mg via SUBCUTANEOUS
  Filled 2015-11-11 (×4): qty 0.94

## 2015-11-11 MED ORDER — WARFARIN SODIUM 7.5 MG PO TABS
7.5000 mg | ORAL_TABLET | Freq: Once | ORAL | Status: AC
  Administered 2015-11-11: 7.5 mg via ORAL
  Filled 2015-11-11: qty 1

## 2015-11-11 NOTE — Progress Notes (Signed)
SATURATION QUALIFICATIONS: (This note is used to comply with regulatory documentation for home oxygen)  Patient Saturations on Room Air at Rest = 97%  Patient Saturations on Room Air while Ambulating = *87%  Patient Saturations on 2 Liters of oxygen while Ambulating = 95%  Please briefly explain why patient needs home oxygen: YES, pt desats while ambulating

## 2015-11-11 NOTE — Discharge Summary (Signed)
Name: Tina Chase MRN: [id number] DOB: Feb 18, 1941 75 y.o. PCP: Tyson Alias, MD  Date of Admission: 11/06/2015 12:09 PM Date of Discharge: 11/12/2015 Attending Physician: No att. providers found  Discharge Diagnosis:  Principal Problem:   Acute saddle pulmonary embolism with acute cor pulmonale (HCC) Active Problems:   Asthma   Hypertension   Obesity, Class III, BMI 40-49.9 (morbid obesity) (HCC)   Hyperuricemia   Nephrolithiasis   Bilateral lower extremity edema   Acute respiratory failure with hypoxia (HCC)  Discharge Medications:   Medication List    TAKE these medications        acetaminophen 325 MG tablet  Commonly known as:  TYLENOL  Take 325 mg by mouth every 6 (six) hours as needed for mild pain.     albuterol 108 (90 Base) MCG/ACT inhaler  Commonly known as:  PROVENTIL HFA;VENTOLIN HFA  Inhale 2 puffs into the lungs every 4 (four) hours as needed for wheezing or shortness of breath.     allopurinol 300 MG tablet  Commonly known as:  ZYLOPRIM  Take 300 mg by mouth daily.     amLODipine 10 MG tablet  Commonly known as:  NORVASC  Take 1 tablet (10 mg total) by mouth daily.     budesonide 0.25 MG/2ML nebulizer solution  Commonly known as:  PULMICORT  Take 2 mLs (0.25 mg total) by nebulization 2 (two) times daily.     enoxaparin 150 MG/ML injection  Commonly known as:  LOVENOX  Inject 0.94 mLs (140 mg total) into the skin every 12 (twelve) hours.     furosemide 20 MG tablet  Commonly known as:  LASIX  Take 2 tablets (40 mg total) by mouth daily.     guaiFENesin 600 MG 12 hr tablet  Commonly known as:  MUCINEX  Take 1 tablet (600 mg total) by mouth 2 (two) times daily.     loperamide 2 MG capsule  Commonly known as:  IMODIUM  Take 1 capsule (2 mg total) by mouth daily as needed for diarrhea or loose stools.     Medical Compression Stockings Misc  Please place on feet as needed for swelling.     Metoprolol Tartrate 75 MG Tabs  Take 75  mg by mouth 2 (two) times daily.     Potassium Citrate 15 MEQ (1620 MG) Tbcr  Take 2 tablets by mouth 2 (two) times daily.     warfarin 7.5 MG tablet  Commonly known as:  COUMADIN  Take 1 tablet (7.5 mg total) by mouth daily at 6 PM.        Disposition and follow-up:   Tina Chase was discharged from [facility] in Good condition.  At the hospital follow up visit please address:  1.  Acute submassive PE Patient has been started on Coumadin with a minimum 5 day Lovenox bridge. Plan is to continue Coumadin for 3 months and then transition patient to Xarelto for lifelong anticoagulation.   2.  Labs / imaging needed at time of follow-up: INR check, repeat echo in 3 months  3.  Pending labs/ test needing follow-up:   Follow-up Appointments: Follow-up Information    Follow up with Boykin Peek, MD. Go on 11/25/2015.   Specialty:  Internal Medicine   Why:  Follow up appointment at 10:15 am.    Contact information:   [address] [place] [address] [phone]       Follow up with [facility].   Why:  Home 02 arranged- portable tank to be delivered to room prior to discharge   Contact information:   [address] [place] [address] [phone]       Follow up with [facility]. Go on 11/18/2015.   Why:  Go at 10:30 am for INR check with Dr. Leanne ChangGross   Contact information:   [address] [place] [id number] [phone]      Discharge Instructions:   Continue using Lovenox twice daily as instructed.  Continue taking Coumadin as instructed.  Please go to [facility] on Friday 11/15/15 between 7:30 am - 4:30 pm for INR check.  They are located on: [address] [place], [place] [address]  [facility] on Monday 11/18/15 at 10:30 to see Dr. Michaell CowingGross for an INR check.   Consultations: Critical Care Medicine   Procedures Performed:  Dg  Chest 2 View  10/25/2015  CLINICAL DATA:  [age] year old female with abdominal pain, and shortness of breath and right flank pain. EXAM: CHEST  2 VIEW COMPARISON:  Radiograph dated 07/04/2014 FINDINGS: Two views of the chest demonstrate mild bibasilar atelectatic changes. Slight increased density in the lingula may be atelectatic changes. Pneumonia is not excluded. There is no focal consolidation, pleural effusion, or pneumothorax. There is stable cardiomegaly. There is prominence of the right pulmonary artery is which may represent a degree of underlying pulmonary hypertension. No acute osseous pathology identified. IMPRESSION: Minimal increased density in the lingula, likely atelectatic changes. Pneumonia is less likely but not excluded. Clinical correlation is recommended. Cardiomegaly with prominence of the central pulmonary vasculature may represent underlying pulmonary hypertension. Electronically Signed   By: Elgie CollardArash  Radparvar M.D.   On: 10/25/2015 03:19   Ct Angio Chest Pe W/cm &/or Wo Cm  11/06/2015  ADDENDUM REPORT: 11/06/2015 15:22 ADDENDUM: Critical Value/emergent results were called by telephone at the time of interpretation on 11/06/2015 at at 1518 hours to Dr. Benjiman CoreNATHAN PICKERING , who verbally acknowledged these results. Electronically Signed   By: Odessa FlemingH  Hall M.D.   On: 11/06/2015 15:22  11/06/2015  CLINICAL DATA:  [age] year old female with shortness of breath for several weeks, progressive today. Initial encounter. Personal history of pulmonary embolism and 2013 EXAM: CT ANGIOGRAPHY CHEST WITH CONTRAST TECHNIQUE: Multidetector CT imaging of the chest was performed using the standard protocol during bolus administration of intravenous contrast. Multiplanar CT image reconstructions and MIPs were obtained to evaluate the vascular anatomy. CONTRAST:  100 mL Omnipaque 350. COMPARISON:  Chest radiograph 1244 hours today and earlier. Chest CT 12/18/2011 FINDINGS: Adequate contrast bolus timing in the pulmonary  arterial tree. Positive for saddle pulmonary embolus with confluent bilateral hilar, and especially lobar pulmonary thrombus on the right side. RV / LV ratio = 1.9. Chronic cardiomegaly appears stable with no pericardial effusion. Calcified aortic and coronary artery atherosclerosis. No pleural effusion. No mediastinal lymphadenopathy. Major airways are patent. There is a stable chronic 12 mm right hilar pulmonary nodule on series 6, image 29. Areas of mild streaky or ground-glass opacity, but no more confluent pulmonary opacity. Negative visualized liver, spleen, pancreas, and bowel in the upper abdomen. No acute osseous abnormality identified. Review of the MIP images confirms the above findings. IMPRESSION: 1. Positive for acute PE with CT evidence of right heart strain (RV/LV Ratio = 1.9) consistent with at least submassive (intermediate risk) PE. The presence of right heart strain has been associated with an increased risk  of morbidity and mortality. Please activate Code PE by paging [phone]. 2. No pleural effusion or pulmonary infarction. A 12 mm right perihilar lung nodule is stable since 2013 and benign. Electronically Signed: By: Odessa Fleming M.D. On: 11/06/2015 15:15   Dg Chest Portable 1 View  11/06/2015  CLINICAL DATA:  Chest pain/sob for 2 weeks,worse today,hx chf EXAM: PORTABLE CHEST - 1 VIEW COMPARISON:  10/25/2015 FINDINGS: Moderate cardiomegaly. Tortuous atheromatous aorta. Enlarged central pulmonary arteries. No focal infiltrate. Mild central pulmonary vascular congestion. No effusion.  No pneumothorax. Visualized skeletal structures are unremarkable. IMPRESSION: 1. Cardiomegaly and central pulmonary vascular congestion. Electronically Signed   By: Corlis Leak M.D.   On: 11/06/2015 12:53   Ct Renal Stone Study  10/25/2015  CLINICAL DATA:  Right flank pain this week. EXAM: CT ABDOMEN AND PELVIS WITHOUT CONTRAST TECHNIQUE: Multidetector CT imaging of the abdomen and pelvis was performed following  the standard protocol without IV contrast. COMPARISON:  12/18/2011 FINDINGS: Lung bases are clear. Calcification in the aortic valve. Small esophageal hiatal hernia. Previous right nephrectomy. No residual or recurrent mass. Left kidney appears intact. No hydronephrosis or hydroureter. No renal, ureteral, or bladder stone. Bladder wall is not thickened. The unenhanced appearance of the liver, spleen, gallbladder, pancreas, adrenal glands, abdominal aorta, inferior vena cava, and retroperitoneal lymph nodes is unremarkable. Stomach and small bowel are decompressed. Scattered stool in the colon. No colonic distention. Scattered colonic diverticula. There is a broad-based right anterior abdominal wall hernia containing small bowel, mesenteric, and colon. There is some stranding in the mesenteric fat in the hernia suggesting fat necrosis. No evidence of proximal bowel or colonic obstruction. No free air or free fluid in the abdomen. Appendix is normal. Diverticulosis of the sigmoid colon without evidence of diverticulitis. Uterus and ovaries are not enlarged. No free or loculated pelvic fluid collections. No pelvic mass or lymphadenopathy. All degenerative changes in the spine and hips. No destructive bone lesions. IMPRESSION: Surgical absence of the right kidney. No stone or obstruction on the left. Large right abdominal wall hernia containing small and large bowel and mesenteric. Mesenteric stranding suggests fat necrosis. No bowel obstruction. Electronically Signed   By: Burman Nieves M.D.   On: 10/25/2015 03:32    2D Echo: 11/09/15 Study Conclusions  - Left ventricle: The cavity size was normal. Wall thickness was  increased in a pattern of mild LVH. Systolic function was normal.  The estimated ejection fraction was in the range of 55% to 60%.  Wall motion was normal; there were no regional wall motion  abnormalities. Doppler parameters are consistent with abnormal  left ventricular relaxation  (grade 1 diastolic dysfunction). - Ventricular septum: The contour showed moderate systolic  flattening. These changes are consistent with RV pressure  overload. - Left atrium: The atrium was mildly dilated. - Right ventricle: The cavity size was dilated. - Tricuspid valve: There was moderate-severe regurgitation. - Pulmonary arteries: Systolic pressure was severely increased. PA  peak pressure: 87 mm Hg (S). - Pericardium, extracardiac: A trivial pericardial effusion was  identified.  Admission HPI: Ms. Ledee is a 75 year old woman with PMH of asthma, HLD, HTN, lipedema, OSA, nephrolithiasis s/p nephrectomy (2013) and hx of PE (2013 provoked by recent surgery) here with c/o DOE x 2 weeks. Denies dyspnea at rest. Reports occasional sharp chest pain that last a few seconds and resolves spontaneously. She denies chest pain with breathing and does not feel the sharp chest pain is related to her dyspnea. She denies  recent surgery, prolonged immobilization, travel, cancer hx, stroke or bleeding. She reports chronic B/L leg edema (lipedema noted in hx) and denies leg pain or unilateral swelling. She increased her inhaler use (three time daily) to help with her dyspnea and said it was working until this morning. She also noted feeling lightheaded this AM but no syncope. Because her dyspnea was not responsive to inhaler this AM she went next door and asked her neighbor to call 911. She also notes decreased appetite in past two weeks but able to eat and stay hydrated.   She has a past hx of PE in 2013 after protracted hospital stay for SBO due to strangulated hernia requiring small bowel resection. She has been off of warfarin for several years (completed A/C time-frame for first provoked PE in 2013). She tolerated warfarin in the past w/o bleeding issues. She is not aware of any family hx of blood clots.  Hospital Course by problem list: Principal Problem:   Acute saddle pulmonary embolism  with acute cor pulmonale (HCC) Active Problems:   Asthma   Hypertension   Obesity, Class III, BMI 40-49.9 (morbid obesity) (HCC)   Hyperuricemia   Nephrolithiasis   Bilateral lower extremity edema   Acute respiratory failure with hypoxia (HCC)   Acute submassive PE Patient was admitted on 11/06/15 for a R submassive PE, almost saddle embolus. PE was thought to be unprovoked. This is her 2nd PE (first was provoked in 2013 in the setting of protracted hospital stay for SBO due to strangulated hernia requiring small bowel resection). BP was stable. Troponin 0.04, likely from [facility] strain. BNP was normal. She was started on heparin drip and transferred to the ICU. Was on 50% VM, then tapered down to 4L O2 via [place]. Lower extremity dopplers did not show evidence of DVT. She was on heparin drip for 48 hours and then transitioned to Xarelto in the ICU. Patient was then transferred to our service. Her condition improved. She was not dyspneic at rest but required 2 L O2 via [place] to maintain adequate O2 sats with ambulation. Echo showing LVEF 55-60%, mild LVH, grade 1 diastolic dysfunction, RV pressure overload, mild RA dilation, mild RV dilation, moderate-severe tricuspid regurgitation, PA peak pressure increased at 87 mmHg, and a trivial pericardial effusion. The day prior to discharge, we made the decision to stop Xarelto for now. Instead, we started patient on Coumadin with a minimum 5 day Lovenox bridge. Plan is to continue Coumadin for 3 months and then transition patient to Xarelto for lifelong anticoagulation. Patient was feeling well on the day of discharge and was set up for home O2 prior to leaving the hospital. She has been set up for an INR check at [facility] on 11/15/15 and then has a follow-up appointment with Dr. Michaell Cowing at the coumadin clinic on 11/18/15. She will be following up at the [facility] clinic on 11/25/15.  -Please repeat echo as outpatient.   Lung nodule: CT from 11/06/15 showing a 12 mm R  perihilar lung nodule, stable since 2013 and benign.  - Patient may need PET/ Biopsy as outpatient   Discharge Vitals:   BP 147/70 mmHg  Pulse 96  Temp(Src) 97.9 F (36.6 C) (Oral)  Resp 18  Ht  (1.626 m)  Wt 309 lb 1.4 oz (140.2 kg)  BMI 53.03 kg/m2  SpO2 96%  LMP 09/23/1986  Discharge Labs:  No results found for this or any previous visit (from the past 24 hour(s)).  Signed: Ulyess Blossom  Loney Loh, MD 11/13/2015, 7:17 AM    Services Ordered on Discharge: Home O2 Equipment Ordered on Discharge:

## 2015-11-11 NOTE — Progress Notes (Signed)
ANTICOAGULATION CONSULT NOTE - Initial Consult  Pharmacy Consult for Lovenox and Coumadin Indication: pulmonary embolus  Allergies  Allergen Reactions  . Hydralazine Anaphylaxis    Swelling tongue.  . Aspirin Other (See Comments)    REACTION: hives  . Crab [Shellfish Allergy] Hives    Can eat shrimp and fish and lobster  . Lisinopril Swelling    REACTION: angioedema Tongue swelling    Patient Measurements: Height: 5\' 4"  (162.6 cm) Weight: (!) 310 lb 6.5 oz (140.8 kg) IBW/kg (Calculated) : 54.7 Heparin Dosing Weight:   Vital Signs: Temp: 98.3 F (36.8 C) (04/10 1233) Temp Source: Oral (04/10 1233) BP: 131/66 mmHg (04/10 1233) Pulse Rate: 87 (04/10 1233)  Labs:  Recent Labs  11/09/15 0455  HGB 12.0  HCT 38.0  PLT 214    Estimated Creatinine Clearance: 88.1 mL/min (by C-G formula based on Cr of 0.74).   Medical History: Past Medical History  Diagnosis Date  . Hyperlipidemia   . Hypertension   . Hyperparathyroidism   . Obesity   . Hyperglycemia   . Hypercalcemia   . Asthma   . Shortness of breath     wtih exertion   . Chronic kidney disease     right non functioning kidney   . Arthritis     left knee  and left shoulder   . Myocardial infarction (HCC)   . Pulmonary embolism (HCC)     Provoked 2013  . CHF (congestive heart failure) (HCC)     Medications:  Scheduled:  . allopurinol  300 mg Oral Daily  . budesonide  0.25 mg Nebulization BID  . enoxaparin (LOVENOX) injection  1 mg/kg Subcutaneous Q12H  . ipratropium-albuterol  3 mL Nebulization BID  . pantoprazole  40 mg Oral Q1200  . potassium citrate  20 mEq Oral BID  . sodium chloride flush  3 mL Intravenous Q12H    Assessment: 75yo female who has been receiving treatment for PE with Xarelto, discharge planned for today.  Now changing to Lovenox bridge to Coumadin for submassive PE with R heart strain (hx PE 4472yrs ago post-hernia surgery tx with warfarin).  Doppler (-)DVT.  Spoke with  IM to see  why changed to Enox/Coum from planned d/c on Xarelto- had spoken with DrGranfortuna who feels not enough evidence for Xarelto in recurrent PE in this wt pt to use in acute setting.  New plan is Enox/Coum x 3 mo for acute tx then change to Xarelto for chronic.    Pt will require minimum overlap of Lovenox/Coumadin of 5 days with INR therapeutic x 24hr.  Last dose of Xarelto was at 8AM today.  Heparin 4/5 >> 4/6 Xarelto 4/6 >> 4/10 AM Enox/Coum 4/10 PM >>  Goal of Therapy:  INR 2-3 Monitor platelets by anticoagulation protocol: Yes   Plan:  - Deleted Xarelto handout from D/C AVS - Lovenox 140mg  SQ 12, start tonight (LD Riva at 8AM) - Coumadin 7.5mg  tonight at 8PM (d/t LD Riva) - Daily INR - CBC q72 - Coum book, video  Marisue HumbleKendra Sacoya Mcgourty, PharmD Clinical Pharmacist [facility]

## 2015-11-11 NOTE — Progress Notes (Signed)
Subjective: Patient was seen and examined at bedside this morning. No acute overnight events. Denies having any SOB or chest pain. No other complaints.   Objective: Vital signs in last 24 hours: Filed Vitals:   11/11/15 0502 11/11/15 1022 11/11/15 1024 11/11/15 1233  BP: 98/60   131/66  Pulse: 103   87  Temp: 98 F (36.7 C)   98.3 F (36.8 C)  TempSrc: Oral   Oral  Resp: 19   20  Height:      Weight: 310 lb 6.5 oz (140.8 kg)     SpO2: 96% 99% 99% 94%   Weight change: 4.9 oz (0.139 kg)  Intake/Output Summary (Last 24 hours) at 11/11/15 1507 Last data filed at 11/11/15 1247  Gross per 24 hour  Intake    240 ml  Output    600 ml  Net   -360 ml   General: In no acute distress. Sitting comfortably on bedside chair watching TV.  Neuro: Moving all extremities, following commands.  HEENT: Moist oral mucosa, EOMI. Cardiovascular: Sinus tachycardia, no murmurs/rubs/gallops Lungs: Lungs CTAB. No wheezes, rales, or rhonchi.  Abdomen: obese, +BS, soft, not tender, non-distended Extremities: Puffy feet, not pitting  Lab Results: Basic Metabolic Panel:  Recent Labs Lab 11/07/15 0542 11/08/15 0300  NA 143 141  K 3.3* 4.2  CL 106 105  CO2 26 25  GLUCOSE 106* 105*  BUN 6 8  CREATININE 0.79 0.74  CALCIUM 9.4 9.6   CBC:  Recent Labs Lab 11/06/15 1310  11/08/15 0300 11/09/15 0455  WBC 9.2  < > 9.4 8.6  NEUTROABS 6.0  --   --   --   HGB 13.7  < > 13.0 12.0  HCT 42.6  < > 41.1 38.0  MCV 93.0  < > 94.3 94.1  PLT 224  < > 183 214  < > = values in this interval not displayed. Cardiac Enzymes:  Recent Labs Lab 11/08/15 0817  TROPONINI 0.04*    D-Dimer:  Recent Labs Lab 11/06/15 1310  DDIMER >20.00*   CBG:  Recent Labs Lab 11/06/15 1806  GLUCAP 102*   Urine Drug Screen: Drugs of Abuse     Component Value Date/Time   LABOPIA NEG 06/08/2007 2121   COCAINSCRNUR NEG 06/08/2007 2121   LABBENZ NEG 06/08/2007 2121   AMPHETMU NEG 06/08/2007 2121     Micro Results: Recent Results (from the past 240 hour(s))  MRSA PCR Screening     Status: None   Collection Time: 11/06/15  6:11 PM  Result Value Ref Range Status   MRSA by PCR NEGATIVE NEGATIVE Final    Comment:        The GeneXpert MRSA Assay (FDA approved for NASAL specimens only), is one component of a comprehensive MRSA colonization surveillance program. It is not intended to diagnose MRSA infection nor to guide or monitor treatment for MRSA infections.    Studies/Results: No results found. Medications: I have reviewed the patient's current medications. Scheduled Meds: . allopurinol  300 mg Oral Daily  . budesonide  0.25 mg Nebulization BID  . enoxaparin (LOVENOX) injection  1 mg/kg Subcutaneous Q12H  . ipratropium-albuterol  3 mL Nebulization BID  . pantoprazole  40 mg Oral Q1200  . potassium citrate  20 mEq Oral BID  . sodium chloride flush  3 mL Intravenous Q12H  . warfarin  7.5 mg Oral Once  . Warfarin - Pharmacist Dosing Inpatient   Does not apply q1800   Continuous Infusions:  PRN  Meds:.acetaminophen **OR** acetaminophen, albuterol, loperamide, sodium chloride flush Assessment/Plan: Principal Problem:   Acute saddle pulmonary embolism with acute cor pulmonale (HCC) Active Problems:   Asthma   Hypertension   Obesity, Class III, BMI 40-49.9 (morbid obesity) (HCC)   Hyperuricemia   Nephrolithiasis   Bilateral lower extremity edema   Acute respiratory failure with hypoxia (HCC)  Acute submassive PE: Patient is feeling well today and denies having any SOB or CP. She was transitioned from heparin drip to Xarelto on 4/7. She was continued on Xarelto until today.  - Monitor on tele  - D/c Xalreto - Start Coumadin with a minimum 5 day Lovenox bridge tonight. Plan is to continue Coumadin for 3 months and then transition patient to Xarelto. She will be needing lifelong anticoagulation in the setting of this unprovoked PE.    - Patient will need echo repeated as  outpatient   Asthma: Lungs CTAB, no wheezes heard.   - Albuterol nebs prn. - Budesonide  Lung nodule: CT from 11/06/15 showing a 12 mm R perihilar lung nodule, stable since 2013 and benign.  - Pt may need PET/ Biopsy as outpatient   Hx of OSA -Sleep studies needed out-pt  Hx of nephrolithiasis s/p nephrectomy- 2013 - Continue home allopurinol and potassium citrate for stone ppx.  GI ppx: PPI  DVT/PE ppx: Coumadin with Lovenox bridge   Dispo: Disposition is deferred at this time, awaiting improvement of current medical problems.  Anticipated discharge in approximately 0-1 day(s).   The patient does have a current PCP Para March(Duncan Orma Flaminghomas Vincent, MD) and [facility] after discharge.  The patient does not have transportation limitations that hinder transportation to clinic appointments.  .Services Needed at time of discharge: Y = Yes, Blank = No PT:   OT:   RN:   Equipment:   Other:     LOS: 5 days   John GiovanniVasundhra Giannis Corpuz, MD 11/11/2015, 3:07 PM

## 2015-11-11 NOTE — Progress Notes (Signed)
  Date: 11/11/2015  Patient name: Tina Chase  Medical record number: [id number]  Date of birth: 07-22-1941   This patient has been seen and the plan of care was discussed with the house staff. Please see their note for complete details. I concur with their findings with the following additions/corrections: [name] was seen on AM rounds. She is sitting in recliner. No complaints. Will need O2 temp at D/C. Team discussed DOAC with dr Reece AgarG who rec warfarin 3 months 2/2 weight. Stable for D/C.  Burns SpainElizabeth A Butcher, MD 11/11/2015, 2:26 PM

## 2015-11-11 NOTE — Care Management Important Message (Signed)
Important Message  Patient Details  Name: Tina Chase MRN: [id number] Date of Birth: 11-Mar-1941   Medicare Important Message Given:  Yes    Kyla BalzarineShealy, Brentney Goldbach [place] 11/11/2015, 12:26 PM

## 2015-11-11 NOTE — Progress Notes (Signed)
Occupational Therapy Treatment Patient Details Name: Tina BlendRebecca J Chase MRN: [id number] DOB: 1941/04/05 Today's Date: 11/11/2015    History of present illness Pt is a 75 y.o. female admitted with 3 wk h/o dyspnea, cough.  O2 sats in ER at 70%.  Treated for asthma earlier in week with no improvements.  Pt with PE.  Pt w PE 3 years ago after abdominal surgery.  BLE dopplers negative.   OT comments  Pt progressing. Education provided in session. Energy conservation handout given to pt.  Follow Up Recommendations  Supervision - Intermittent;No OT follow up    Equipment Recommendations  None recommended by OT    Recommendations for Other Services      Precautions / Restrictions Precautions Precautions: Fall Precaution Comments: watch HR Restrictions Weight Bearing Restrictions: No       Mobility Bed Mobility               General bed mobility comments: not assessed-pt in chair  Transfers Overall transfer level: Modified independent   Transfers: Sit to/from Stand Sit to Stand: Modified independent (Device/Increase time)              Balance                                   ADL Overall ADL's : Needs assistance/impaired     Grooming: Applying deodorant;Oral care;Standing;Supervision/safety;Set up;Sitting Grooming Details (indicate cue type and reason): pt gathered most items and brought to sink; OT retrieved cup and handed her deodorant while she was sitting Upper Body Bathing: Supervision/ safety;Standing (pt also gathered items)   Lower Body Bathing: Supervison/ safety (standing-pt also gathered items)   Upper Body Dressing : Minimal assistance;Sitting;Standing Upper Body Dressing Details (indicate cue type and reason): OT assisted in untying knot in gown     Toilet Transfer: Supervision/safety;Ambulation (sit to stand/stand to sit to/from chair-Mod I)           Functional mobility during ADLs: Supervision/safety General ADL Comments:  Educated on energy conservation techniques. Educated on safety such recommended someone be with her for tub transfer at least initially. Discussed tub transfer technique.       Vision                     Perception     Praxis      Cognition  Awake/Alert Behavior During Therapy: WFL for tasks assessed/performed Overall Cognitive Status: Within Functional Limits for tasks assessed                       Extremity/Trunk Assessment               Exercises     Shoulder Instructions       General Comments      Pertinent Vitals/ Pain       Pain Assessment: No/denies pain; HR up to 130s in session; cues given to take breaks and HR would trend down  Home Living                                          Prior Functioning/Environment              Frequency Min 2X/week     Progress Toward Goals  OT Goals(current goals can now be found in  the care plan section)  Progress towards OT goals: Progressing toward goals; also added to a goal  Acute Rehab OT Goals Patient Stated Goal: go home and take a bath OT Goal Formulation: With patient Time For Goal Achievement: 11/21/15 Potential to Achieve Goals: Good ADL Goals Pt Will Perform Upper Body Dressing: with modified independence;standing Additional ADL Goal #1: Pt will walk to bathroom and toilet with mod I. Additional ADL Goal #2: Pt will state 3 things she can incorporate at home into ADL routine to conserve energy and utilize in session as needed.  Plan Discharge plan remains appropriate    Co-evaluation                 End of Session     Activity Tolerance Patient tolerated treatment well   Patient Left in chair;with call bell/phone within reach   Nurse Communication          Time: [phone] (short period of time going to get ADL items outside of room) OT Time Calculation (min): 22 min  Charges: OT General Charges $OT Visit: 1 Procedure OT Treatments $Self  Care/Home Management : 8-22 mins  Earlie Raveling OTR/L [phone] 11/11/2015, 9:36 AM

## 2015-11-12 ENCOUNTER — Other Ambulatory Visit: Payer: Self-pay | Admitting: Internal Medicine

## 2015-11-12 LAB — CBC
HEMATOCRIT: 38.1 % (ref 36.0–46.0)
HEMOGLOBIN: 12 g/dL (ref 12.0–15.0)
MCH: 29.3 pg (ref 26.0–34.0)
MCHC: 31.5 g/dL (ref 30.0–36.0)
MCV: 93.2 fL (ref 78.0–100.0)
Platelets: 229 10*3/uL (ref 150–400)
RBC: 4.09 MIL/uL (ref 3.87–5.11)
RDW: 14.2 % (ref 11.5–15.5)
WBC: 8.1 10*3/uL (ref 4.0–10.5)

## 2015-11-12 LAB — PROTIME-INR
INR: 1.38 (ref 0.00–1.49)
PROTHROMBIN TIME: 17.1 s — AB (ref 11.6–15.2)

## 2015-11-12 MED ORDER — GUAIFENESIN ER 600 MG PO TB12
600.0000 mg | ORAL_TABLET | Freq: Two times a day (BID) | ORAL | Status: DC
  Administered 2015-11-12 (×2): 600 mg via ORAL
  Filled 2015-11-12 (×2): qty 1

## 2015-11-12 MED ORDER — ENOXAPARIN SODIUM 150 MG/ML ~~LOC~~ SOLN: 1.0000 mg/kg | Freq: Two times a day (BID) | SUBCUTANEOUS | Status: DC

## 2015-11-12 MED ORDER — WARFARIN SODIUM 7.5 MG PO TABS: 7.5000 mg | ORAL_TABLET | Freq: Every day | ORAL | Status: DC

## 2015-11-12 MED ORDER — GUAIFENESIN ER 600 MG PO TB12: 600.0000 mg | ORAL_TABLET | Freq: Two times a day (BID) | ORAL | Status: DC

## 2015-11-12 MED ORDER — WARFARIN SODIUM 7.5 MG PO TABS
7.5000 mg | ORAL_TABLET | Freq: Every day | ORAL | Status: DC
  Administered 2015-11-12: 7.5 mg via ORAL
  Filled 2015-11-12: qty 1

## 2015-11-12 MED ORDER — DM-GUAIFENESIN ER 30-600 MG PO TB12: 1.0000 | ORAL_TABLET | Freq: Two times a day (BID) | ORAL | Status: DC

## 2015-11-12 NOTE — Telephone Encounter (Signed)
This came in via surescripts, hard to do my review on if refill needed, last one written, etc due to patient being in house.

## 2015-11-12 NOTE — Consult Note (Signed)
   [facility] Inpatient Consult   11/12/2015  Tina Chase Apr 05, 1941 [id number] Patient screened for potential [facility] Care Management services. Patient is eligible for [facility] Services. Spoke with inpatient RNCM and states patient has no current care management needs at this time.  If this changes please contact: Charlesetta ShanksVictoria Margaret Staggs, RN BSN CCM [facility] Liaison  [phone] business mobile phone Toll free office [phone]

## 2015-11-12 NOTE — Progress Notes (Signed)
Subjective: Patient was seen and examined at bedside this morning. No acute overnight events. Denies having any SOB or chest pain. No other complaints.   Objective: Vital signs in last 24 hours: Filed Vitals:   11/11/15 1233 11/11/15 2026 11/11/15 2108 11/12/15 0433  BP: 131/66 125/71  112/60  Pulse: 87 92  95  Temp: 98.3 F (36.8 C) 98.4 F (36.9 C)  97.6 F (36.4 C)  TempSrc: Oral Oral  Oral  Resp: 20 18  18   Height:      Weight:    309 lb 1.4 oz (140.2 kg)  SpO2: 94% 98% 96% 93%   Weight change: -1 lb 5.2 oz (-0.6 kg)  Intake/Output Summary (Last 24 hours) at 11/12/15 0956 Last data filed at 11/12/15 0900  Gross per 24 hour  Intake    840 ml  Output    200 ml  Net    640 ml   General: In no acute distress. Sitting comfortably on bedside chair watching TV.  Neuro: Moving all extremities, following commands.  HEENT: Moist oral mucosa, EOMI. Cardiovascular: Sinus tachycardia, no murmurs/rubs/gallops Lungs: Bibasilar rales. No wheezes, rales, or rhonchi.  Abdomen: obese, +BS, soft, not tender, non-distended Extremities: Puffy feet, not pitting  Lab Results: Basic Metabolic Panel:  Recent Labs Lab 11/07/15 0542 11/08/15 0300  NA 143 141  K 3.3* 4.2  CL 106 105  CO2 26 25  GLUCOSE 106* 105*  BUN 6 8  CREATININE 0.79 0.74  CALCIUM 9.4 9.6   CBC:  Recent Labs Lab 11/06/15 1310  11/09/15 0455 11/12/15 0620  WBC 9.2  < > 8.6 8.1  NEUTROABS 6.0  --   --   --   HGB 13.7  < > 12.0 12.0  HCT 42.6  < > 38.0 38.1  MCV 93.0  < > 94.1 93.2  PLT 224  < > 214 229  < > = values in this interval not displayed. Cardiac Enzymes:  Recent Labs Lab 11/08/15 0817  TROPONINI 0.04*    D-Dimer:  Recent Labs Lab 11/06/15 1310  DDIMER >20.00*   CBG:  Recent Labs Lab 11/06/15 1806  GLUCAP 102*   Urine Drug Screen: Drugs of Abuse     Component Value Date/Time   LABOPIA NEG 06/08/2007 2121   COCAINSCRNUR NEG 06/08/2007 2121   LABBENZ NEG  06/08/2007 2121   AMPHETMU NEG 06/08/2007 2121     Micro Results: Recent Results (from the past 240 hour(s))  MRSA PCR Screening     Status: None   Collection Time: 11/06/15  6:11 PM  Result Value Ref Range Status   MRSA by PCR NEGATIVE NEGATIVE Final    Comment:        The GeneXpert MRSA Assay (FDA approved for NASAL specimens only), is one component of a comprehensive MRSA colonization surveillance program. It is not intended to diagnose MRSA infection nor to guide or monitor treatment for MRSA infections.    Studies/Results: No results found. Medications: I have reviewed the patient's current medications. Scheduled Meds: . allopurinol  300 mg Oral Daily  . budesonide  0.25 mg Nebulization BID  . enoxaparin (LOVENOX) injection  1 mg/kg Subcutaneous Q12H  . furosemide  40 mg Oral Daily  . guaiFENesin  600 mg Oral BID  . ipratropium-albuterol  3 mL Nebulization BID  . pantoprazole  40 mg Oral Q1200  . potassium citrate  20 mEq Oral BID  . sodium chloride flush  3 mL Intravenous Q12H  . Warfarin -  Pharmacist Dosing Inpatient   Does not apply q1800   Continuous Infusions:  PRN Meds:.acetaminophen **OR** acetaminophen, albuterol, loperamide, sodium chloride flush Assessment/Plan: Principal Problem:   Acute saddle pulmonary embolism with acute cor pulmonale (HCC) Active Problems:   Asthma   Hypertension   Obesity, Class III, BMI 40-49.9 (morbid obesity) (HCC)   Hyperuricemia   Nephrolithiasis   Bilateral lower extremity edema   Acute respiratory failure with hypoxia (HCC)  Acute submassive PE: Patient is feeling well today and denies having any SOB or CP. She was transitioned from heparin drip to Xarelto on 4/7. Xarelto was stopped 11/11/15 and patient was started on Warfarin with Lovenox bridge instead.   - Continue Coumadin with a minimum 5 day Lovenox bridge tonight. Plan is to continue Coumadin for 3 months and then transition patient to Xarelto. She will be needing  lifelong anticoagulation in the setting of this unprovoked PE.     CHF Echo done 11/09/15 showing LVEF 55-60%, mild LVH, grade 1 diastolic dysfunction, RV pressure overload, mild RA dilation, mild RV dilation, moderate-severe tricuspid regurgitation, PA peak pressure increased at 87 mmHg, and a trivial pericardial effusion. Patient is feeling well today and denies having any SOB or CP. Bibasilar rales on exam today.  -Continue Lasix 40 mg daily   Asthma: No wheezes heard.   - Albuterol nebs prn. - Budesonide  Lung nodule: CT from 11/06/15 showing a 12 mm R perihilar lung nodule, stable since 2013 and benign.  - Pt may need PET/ Biopsy as outpatient   Hx of OSA -Sleep studies needed out-pt  Hx of nephrolithiasis s/p nephrectomy- 2013 - Continue home allopurinol and potassium citrate for stone ppx.  GI ppx: PPI  DVT/PE ppx: Coumadin with Lovenox bridge   Dispo:  Anticipated discharge in approximately 0-1 day(s).   The patient does have a current PCP Para March Orma Flaming, MD) and does need [facility] follow[facility] appointment after discharge.  The patient does not have transportation limitations that hinder transportation to clinic appointments.  .Services Needed at time of discharge: Y = Yes, Blank = No PT:   OT:   RN:   Equipment:   Other:     LOS: 6 days   John Giovanni, MD 11/12/2015, 9:56 AM

## 2015-11-12 NOTE — Progress Notes (Signed)
  Date: 11/12/2015  Patient name: Tina BlendRebecca J Morino  Medical record number: [id number]  Date of birth: [birth year]   This patient has been seen and the plan of care was discussed with the house staff. Please see their note for complete details. I concur with their findings with the following additions/corrections: [name] was seen with Dr Beckie Saltsivet. She took some time to understand that lovenox would be BID and warfarin QHS. She will need continual education and support to ensure compliance. She gets medical transport through her insurance company. Stable for D/C.  Burns SpainElizabeth A Gilma Bessette, MD 11/12/2015, 11:52 AM

## 2015-11-12 NOTE — Progress Notes (Signed)
Pt given d/c instructions. Instructions and appointments reviewed in detail. Tele removed. Pt told to call when daughter arrives to pick her up. No further questions or need at this time.

## 2015-11-12 NOTE — Care Management Note (Signed)
Case Management Note Previous CM note initiated by Avie ArenasSarah Brown RN, CM  Patient Details  Name: Tina BlendRebecca J Dilger MRN: [id number] Date of Birth: 13-Jan-1941  Subjective/Objective:       Benefits check:  Xarelto: Tier 3, 25% of total cost of medication, Berkley Harveyauth is requried, [phone]   Eliquis: Tier 3, 25% of total cost of medication, Berkley Harveyauth is required [phone]   Patient still has $156.29 remaining on deductible    Action/Plan: Pt admitted with acute saddle PE, PTA pt lived at home- plan to return home will need home 02 - orders for home 02 have been placed- [facility] to be [facility] to room prior to discharge. Pt has been given Xarelto 30 day free card, plan is to d/c on Lovenox-coumadin bridge and change over to Xarelto in the future.   Expected Discharge Date:    11/12/15              Expected Discharge Plan:  Home/Self Care  In-House Referral:     Discharge planning Services  CM Consult  Post Acute Care Choice:  Durable Medical Equipment Choice offered to:  Patient  DME Arranged:  Oxygen DME Agency:  [facility]  HH Arr[facility]    [facility] Agency:     Status of Service:  Completed, signed off  Medicare Important Message Given:  Yes Date Medicare IM Given:    Medicare IM give by:    Date Additional Medicare IM Given:    Additional Medicare Important Message give by:     If discussed at Long Length of Stay Meetings, dates discussed:  11/12/15  Discharge Disposition: home/self care   Additional Comments:  Lives at home alone, daughter present in room.  Independent prior.  Is on oxygen at this time, is not on oxygen at home.  Usual pharmacy is [facility] on [address] [phone]. Attempted to call and see if they could tell co pay, but of course need prescription first.   Pharmacy states that patient considering going home on coumadin, but then will need lovenox probably.  States that she would have 30 day free  card initially with copay card also.    Zenda AlpersWebster, DenverKristi Hall, RN 11/12/2015, 12:05 PM [phone]

## 2015-11-12 NOTE — Progress Notes (Signed)
Physical Therapy Treatment Patient Details Name: Tina Chase MRN: [id number] DOB: 23-Jul-1941 Today's Date: 11/12/2015    History of Present Illness Pt is a 75 y.o. female admitted with 3 wk h/o dyspnea, cough.  O2 sats in ER at 70%.  Treated for asthma earlier in week with no improvements.  Pt with PE.  Pt w PE 3 years ago after abdominal surgery.  BLE dopplers negative.    PT Comments    Pt performed increased gait with SPO2 decreasing to 86% briefly after gait trial, with pursed lip breathing O2 sats improve >90%.  Pt performed with HR elevating 137 bpm.  Pt maintains HR at 130s and returns to 102 after seated rest break.  Pt asymptomatic to elevation in bpm.    Follow Up Recommendations  No PT follow up;Supervision - Intermittent     Equipment Recommendations  None recommended by PT    Recommendations for Other Services       Precautions / Restrictions Precautions Precautions: Fall Precaution Comments: watch HR Restrictions Weight Bearing Restrictions: No Other Position/Activity Restrictions: watch O2 sats    Mobility  Bed Mobility Overal bed mobility:  (Pt received in recliner chair.  )             General bed mobility comments: not assessed-pt in chair  Transfers Overall transfer level: Modified independent Equipment used: None Transfers: Sit to/from Stand Sit to Stand: Modified independent (Device/Increase time)         General transfer comment: Stood from chair x2. No assist needed.   Ambulation/Gait Ambulation/Gait assistance: Supervision Ambulation Distance (Feet): 340 Feet Assistive device: None Gait Pattern/deviations: Step-through pattern;Decreased stride length;Wide base of support   Gait velocity interpretation: Below normal speed for age/gender General Gait Details: Steady gait. Sp02 dropped to 86% on RA. Resolved to >90% with rest and cues for pursed lip breathing. HR up to 137 bpm.   Stairs            Wheelchair Mobility     Modified Rankin (Stroke Patients Only)       Balance Overall balance assessment: Needs assistance Sitting-balance support: Feet unsupported;No upper extremity supported Sitting balance-Leahy Scale: Good       Standing balance-Leahy Scale: Good                      Cognition Arousal/Alertness: Awake/alert Behavior During Therapy: WFL for tasks assessed/performed Overall Cognitive Status: Within Functional Limits for tasks assessed                      Exercises      General Comments        Pertinent Vitals/Pain Pain Assessment: No/denies pain    Home Living                      Prior Function            PT Goals (current goals can now be found in the care plan section) Acute Rehab PT Goals Patient Stated Goal: go home and take a bath Potential to Achieve Goals: Good Progress towards PT goals: Progressing toward goals    Frequency  Min 3X/week    PT Plan Current plan remains appropriate    Co-evaluation             End of Session Equipment Utilized During Treatment: Gait belt Activity Tolerance: Patient tolerated treatment well       Time: [id number] PT Time Calculation (min) (  ACUTE ONLY): 14 min  Charges:  $Gait Training: 8-22 mins                    G Codes:      Tina Chase Nov 27, 2015, 11:59 AM  Tina Chase, PTA pager [phone]

## 2015-11-12 NOTE — Progress Notes (Signed)
ANTICOAGULATION CONSULT NOTE  Pharmacy Consult for Lovenox and Coumadin Indication: pulmonary embolus  Allergies  Allergen Reactions  . Hydralazine Anaphylaxis    Swelling tongue.  . Aspirin Other (See Comments)    REACTION: hives  . Crab [Shellfish Allergy] Hives    Can eat shrimp and fish and lobster  . Lisinopril Swelling    REACTION: angioedema Tongue swelling    Patient Measurements: Height: 5\' 4"  (162.6 cm) Weight: (!) 309 lb 1.4 oz (140.2 kg) IBW/kg (Calculated) : 54.7 Heparin Dosing Weight:   Vital Signs: Temp: 97.6 F (36.4 C) (04/11 0433) Temp Source: Oral (04/11 0433) BP: 112/60 mmHg (04/11 0433) Pulse Rate: 95 (04/11 0433)  Labs:  Recent Labs  11/12/15 0620  HGB 12.0  HCT 38.1  PLT 229  LABPROT 17.1*  INR 1.38    Estimated Creatinine Clearance: 87.9 mL/min (by C-G formula based on Cr of 0.74).   Assessment: Tina Chase who has been receiving treatment for PE with Xarelto, discharge planned for today.  Now changing to Lovenox bridge to Coumadin for submassive PE with R heart strain (hx PE 4926yrs ago post-hernia surgery tx with warfarin).  Doppler (-)DVT.  Spoke with  IM to see why changed to Enox/Coum from planned d/c on Xarelto- had spoken with DrGranfortuna who feels not enough evidence for Xarelto in recurrent PE in this wt pt to use in acute setting.  New plan is Enox/Coum x 3 mo for acute tx then change to Xarelto for chronic.    Pt will require minimum overlap of Lovenox/Coumadin of 5 days with INR therapeutic x 24hr.  Last dose of Xarelto was at 8AM 4/10.  Heparin 4/5 >> 4/6 Xarelto 4/6 >> 4/10 AM Enox/Coum 4/10 PM >>  INR 1.38 this morning after warfarin started last night. It is possible that residual xarelto is affecting INR as no baseline was checked prior to starting warfarin yesterday. Will continue with 7.5mg  of warfarin daily and current lovenox dosing. QD lovenox is not recommended or feasible in obese population.  Goal of Therapy:   INR 2-3 Monitor platelets by anticoagulation protocol: Yes   Plan:  - Lovenox 140mg  SQ 12 - Coumadin 7.5mg  daily for now - Daily INR - CBC q72 - Coum book, video ordered - Will discuss AC with patient prior to d/c  Sheppard CoilFrank Vinnie Gombert PharmD., BCPS Clinical Pharmacist Pager [phone] 11/12/2015 10:14 AM

## 2015-11-12 NOTE — Telephone Encounter (Signed)
This is a new Rx. Would you pls call the pharmacy and make certain they receive it? Nothing else needs done if they received it. Thanks

## 2015-11-13 DIAGNOSIS — J9601 Acute respiratory failure with hypoxia: Secondary | ICD-10-CM | POA: Diagnosis not present

## 2015-11-15 ENCOUNTER — Other Ambulatory Visit: Payer: Self-pay | Admitting: Internal Medicine

## 2015-11-15 ENCOUNTER — Other Ambulatory Visit: Payer: Self-pay | Admitting: Student in an Organized Health Care Education/Training Program

## 2015-11-15 DIAGNOSIS — I2699 Other pulmonary embolism without acute cor pulmonale: Secondary | ICD-10-CM | POA: Diagnosis not present

## 2015-11-15 LAB — PROTIME-INR
INR: 1.9 — ABNORMAL HIGH (ref 0.8–1.2)
PROTHROMBIN TIME: 20.7 s — AB (ref 9.1–12.0)

## 2015-11-15 NOTE — Telephone Encounter (Signed)
Called [facility] on 11/15/15 at 2:05 pm. Spoke to Maralyn SagoSarah and she informed me patient's INR is 1.9 and PT 20.7. Spoke to patient over the phone and advised her to continue using Lovenox until she finishes her 5 day course. Patient told me she has 3 more doses left - one tonight and 2 doses tomorrow. Also advised her to go to her appointment with Dr. Michaell CowingGross on Monday (11/18/15) for an INR check.

## 2015-11-16 MED ORDER — ENOXAPARIN SODIUM 150 MG/ML ~~LOC~~ SOLN: 1.0000 mg/kg | Freq: Two times a day (BID) | SUBCUTANEOUS | Status: DC

## 2015-11-16 NOTE — Addendum Note (Signed)
Addended by: Blanch MediaBUTCHER, Samira Acero A on: 11/16/2015 11:21 AM   Modules accepted: Orders

## 2015-11-16 NOTE — Telephone Encounter (Signed)
INR not therapeutic. Needs at minimum another two days. Called pharmacy - co pay < $2. dalled pt. She will pick up.

## 2015-11-18 ENCOUNTER — Ambulatory Visit (INDEPENDENT_AMBULATORY_CARE_PROVIDER_SITE_OTHER): Payer: Medicare Other | Admitting: Pharmacist

## 2015-11-18 DIAGNOSIS — Z7901 Long term (current) use of anticoagulants: Secondary | ICD-10-CM | POA: Diagnosis not present

## 2015-11-18 DIAGNOSIS — I2602 Saddle embolus of pulmonary artery with acute cor pulmonale: Secondary | ICD-10-CM | POA: Diagnosis not present

## 2015-11-18 LAB — POCT INR: INR: 2.7

## 2015-11-18 MED ORDER — WARFARIN SODIUM 7.5 MG PO TABS: ORAL_TABLET | ORAL | Status: DC

## 2015-11-18 NOTE — Progress Notes (Signed)
Indication: Recurrent venous thromboembolism. Duration: Lifelong anticoagulation. INR: At target. Agree with Dr. Saralyn PilarGroce's assessment and plan.

## 2015-11-18 NOTE — Progress Notes (Signed)
Anti-Coagulation Progress Note  Tina Chase is a 75 y.o. female who is currently on an anti-coagulation regimen.    RECENT RESULTS: Recent results are below, the most recent result is correlated with a dose of 7.5mg  warfarin daily with concomitant Lovenox for a total planned overlap of 5 days minimum. She has 3 syringes remaining and was advised to continue Lovenox, i.e. Use remaining 3 syringes by instructions provided at time of discharge from hospital.  Lab Results  Component Value Date   INR 2.70 11/18/2015   INR 1.38 11/12/2015   INR 2.50 07/18/2012    ANTI-COAG DOSE: Anticoagulation Dose Instructions as of 11/18/2015      Glynis SmilesSun Mon Tue Wed Thu Fri Sat   New Dose 3.75 mg 7.5 mg 7.5 mg 3.75 mg 7.5 mg 3.75 mg 7.5 mg    Description        CONTINUE Lovenox injections every 12 hours using your last 3 syringes. (patient states she has 3 syringes remaining; this will complete her 5 day overlap therapy).        ANTICOAG SUMMARY: Anticoagulation Episode Summary    Current INR goal 2.0-3.0  Next INR check 11/25/2015  INR from last check 2.70 (11/18/2015)  Weekly max dose   Target end date Indefinite  [facility]  Preferred lab   Send INR reminders to    Indications  Acute saddle pulmonary embolism with acute cor pulmonale (HCC) [I26.02]        Comments Per discharge summary for her acute PE, indicates 3 months warfarin, then switch to rivaroxaban for indeterminate length of therapy in setting of recurrence of VTE        ANTICOAG TODAY: Anticoagulation Summary as of 11/18/2015    INR goal 2.0-3.0  Selected INR 2.70 (11/18/2015)  Next INR check 11/25/2015  Target end date Indefinite   Indications  Acute saddle pulmonary embolism with acute cor pulmonale (HCC) [I26.02]      Anticoagulation Episode Summary    [facility]   Preferred lab    Send INR reminders to    Comments Per discharge summary for her acute PE, indicates 3  months warfarin, then switch to rivaroxaban for indeterminate length of therapy in setting of recurrence of VTE      PATIENT INSTRUCTIONS: Patient Instructions  Patient instructed to take medications as defined in the Anti-coagulation Track section of this encounter.  Patient instructed to take today's dose.  Patient instructed to CONTINUE her Lovenox syringes and administer the remaining 3 syringes as she had been instructed (1 injection every 12 hours).Patient verbalized understanding of these instructions.       FOLLOW-UP Return in 7 days (on 11/25/2015) for Follow up INR at 1030h.  Hulen LusterJames Emmalise Huard, III Pharm.D., CACP

## 2015-11-18 NOTE — Patient Instructions (Signed)
Patient instructed to take medications as defined in the Anti-coagulation Track section of this encounter.  Patient instructed to take today's dose.  Patient instructed to CONTINUE her Lovenox syringes and administer the remaining 3 syringes as she had been instructed (1 injection every 12 hours).Patient verbalized understanding of these instructions.

## 2015-11-19 ENCOUNTER — Encounter: Payer: Self-pay | Admitting: General Surgery

## 2015-11-19 DIAGNOSIS — K432 Incisional hernia without obstruction or gangrene: Secondary | ICD-10-CM | POA: Diagnosis not present

## 2015-11-19 NOTE — Progress Notes (Unsigned)
Tina Chase 11/19/2015 9:35 AM Location: [facility] Patient #: [id number] DOB: 04/18/1941 Single / Language: [place] / Race: Black or African American Female   History of Present Illness Tina Chase(Tina Caridi J. Genola Yuille MD; 11/19/2015 10:45 AM) Patient words: ventral hernia.  The patient is a 75 year old female.  Note:She is referred by Dr. Blanch MediaElizabeth Chase for consultation regarding a recurrent ventral hernia. In May 2013, she developed an strangulated ventral incisional hernia after a laparoscopic-assisted right nephrectomy 4 months earlier. She underwent emergency exploratory laparotomy with resection of 90 cm of small bowel and primary repair of the ventral incisional hernia. Her wound healed by secondary intention. Her course was complicated by pulmonary embolism. Earlier this month, she present to the hospital with some abdominal pain and shortness of breath. She was found to have a recurrent saddle pulmonary embolism with right heart strain. She is also found to have a recurrent right-sided abdominal wall hernia containing some small bowel and colon. No evidence of obstruction or strangulation at the hernia site. She is asymptomatic from this. She was recently discharged from the hospital and has been taking Tina Chase shots and Coumadin. She states the plan is to eventually convert her over Xarelto. No obstructive symptoms. In fact, since the emergency surgery, she's had diarrhea and takes Imodium when necessary. She is going to be on lifelong anticoagulation.  Her past medical history is notable for recurrent pulmonary embolism, hypertension, nephrolithiasis, morbid obesity, sleep apnea, asthma  Other Problems Tina Chase(Tina Chase, CMA; 11/19/2015 9:35 AM) Arthritis Asthma Congestive Heart Failure Heart murmur High blood pressure  Past Surgical History Tina Chase(Tina Chase, CMA; 11/19/2015 9:35 AM) Nephrectomy Right. Oral Surgery Resection of Small Bowel Resection of  Stomach  Diagnostic Studies History Tina Chase(Tina Chase, CMA; 11/19/2015 9:35 AM) Colonoscopy 1-5 years ago Mammogram >3 years ago  Allergies Tina Chase(Tina Chase, CMA; 11/19/2015 9:37 AM) Lisinopril *ANTIHYPERTENSIVES* Aspirin *ANALGESICS - NonNarcotic* Hydralazine & HCTZ *ANTIHYPERTENSIVES*  Medication History (Tina Chase, CMA; 11/19/2015 9:39 AM) Allopurinol (300MG  Tablet, Oral) Active. AmLODIPine Besylate (10MG  Tablet, Oral) Active. Furosemide (20MG  Tablet, Oral) Active. Metoprolol Tartrate (50MG  Tablet, Oral) Active. ProAir HFA (108 (90 Base)MCG/ACT Aerosol Soln, Inhalation) Active. Warfarin Sodium (7.5MG  Tablet, Oral) Active. Loperamide HCl (2MG  Capsule, Oral) Active. Enoxaparin Sodium (150MG /ML Solution, Subcutaneous) Active. Medications Reconciled  Social History Tina Chase(Tina Chase, CMA; 11/19/2015 9:35 AM) Alcohol use Remotely quit alcohol use. Caffeine use Carbonated beverages, Coffee, Tea. Tobacco use Former smoker.  Family History Tina Chase(Tina Chase, CMA; 11/19/2015 9:35 AM) Alcohol Abuse Brother, Family Members In General, Father, Sister. Arthritis Mother. Diabetes Mellitus Brother. Heart Disease Father, Mother. Heart disease in female family member before age 73 Hypertension Brother, Mother, Sister. Respiratory Condition Brother, Sister.  Pregnancy / Birth History Tina Chase(Tina Chase, CMA; 11/19/2015 9:35 AM) Age at menarche 14 years. Age of menopause 51-55 Contraceptive History Intrauterine device. Gravida 5 Maternal age 615-20 Para 5 Regular periods    Review of Systems Tina Chase(Tina Chase CMA; 11/19/2015 9:35 AM) General Present- Night Sweats and Weight Loss. Not Present- Appetite Loss, Chills, Fatigue, Fever and Weight Gain. Skin Present- Dryness. Not Present- Change in Wart/Mole, Hives, Jaundice, New Lesions, Non-Healing Wounds, Rash and Ulcer. HEENT Present- Seasonal Allergies. Not Present- Earache, Hearing Loss, Hoarseness, Nose Bleed, Oral Ulcers, Ringing in the  Ears, Sinus Pain, Sore Throat, Visual Disturbances, Wears glasses/contact lenses and Yellow Eyes. Respiratory Present- Wheezing. Not Present- Bloody sputum, Chronic Cough, Difficulty Breathing and Snoring. Breast Not Present- Breast Mass, Breast Pain, Nipple Discharge and Skin Changes. Cardiovascular Present- Swelling of Extremities. Not Present- Chest  Pain, Difficulty Breathing Lying Down, Leg Cramps, Palpitations, Rapid Heart Rate and Shortness of Breath. Gastrointestinal Present- Chronic diarrhea and Excessive gas. Not Present- Abdominal Pain, Bloating, Bloody Stool, Change in Bowel Habits, Constipation, Difficulty Swallowing, Gets full quickly at meals, Hemorrhoids, Indigestion, Nausea, Rectal Pain and Vomiting. Female Genitourinary Not Present- Frequency, Nocturia, Painful Urination, Pelvic Pain and Urgency. Musculoskeletal Not Present- Back Pain, Joint Pain, Joint Stiffness, Muscle Pain, Muscle Weakness and Swelling of Extremities. Neurological Not Present- Decreased Memory, Fainting, Headaches, Numbness, Seizures, Tingling, Tremor, Trouble walking and Weakness. Psychiatric Not Present- Anxiety, Bipolar, Change in Sleep Pattern, Depression, Fearful and Frequent crying. Endocrine Not Present- Cold Intolerance, Excessive Hunger, Hair Changes, Heat Intolerance, Hot flashes and New Diabetes. Hematology Present- Easy Bruising. Not Present- Excessive bleeding, Gland problems, HIV and Persistent Infections.  Vitals (Tina Chase CMA; 11/19/2015 9:36 AM) 11/19/2015 9:36 AM Weight: 307 lb Height: 64in Body Surface Area: 2.35 m Body Mass Index: 52.7 kg/m  Temp.: 22F(Temporal)  Pulse: 79 (Regular)  BP: 130/80 (Sitting, Left Arm, Standard)  Physical Exam Tina Pollack MD; 11/19/2015 10:46 AM) The physical exam findings are as follows: Note:General-morbidly obese female (BMI 52.7) in no acute distress.  HEENT-no facial lesions.  Eyes-no icterus.  Abdomen-morbidly obese with  midline scar and right lower quadrant scar. Multiple areas of ecchymosis from Tina Chase injections. No abdominal wall asymmetry. There is a palpable bulge to the right of the midline when she coughs that is reducible in the supine position.    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 She has a recent saddle pulmonary embolism. I do not recommend repair at this time given the recent pulmonary embolism.  Plan: No lifting over 20 pounds. Return visit in 6 months for reexamination and to discuss elective repair. We'll also need to speak with her primary care physician regarding perioperative anticoagulation if we decide to proceed. Other option is just to do expected management.  Avel Peace, MD

## 2015-11-25 ENCOUNTER — Encounter: Payer: Self-pay | Admitting: Internal Medicine

## 2015-11-25 ENCOUNTER — Ambulatory Visit (INDEPENDENT_AMBULATORY_CARE_PROVIDER_SITE_OTHER): Payer: Medicare Other | Admitting: Internal Medicine

## 2015-11-25 ENCOUNTER — Ambulatory Visit (INDEPENDENT_AMBULATORY_CARE_PROVIDER_SITE_OTHER): Payer: Medicare Other | Admitting: Pharmacist

## 2015-11-25 VITALS — BP 129/76 | HR 72 | Temp 98.5°F | Ht 64.0 in | Wt 312.6 lb

## 2015-11-25 DIAGNOSIS — I10 Essential (primary) hypertension: Secondary | ICD-10-CM | POA: Diagnosis not present

## 2015-11-25 DIAGNOSIS — Z7901 Long term (current) use of anticoagulants: Secondary | ICD-10-CM

## 2015-11-25 DIAGNOSIS — I2699 Other pulmonary embolism without acute cor pulmonale: Secondary | ICD-10-CM | POA: Diagnosis not present

## 2015-11-25 DIAGNOSIS — I2602 Saddle embolus of pulmonary artery with acute cor pulmonale: Secondary | ICD-10-CM

## 2015-11-25 LAB — POCT INR: INR: 3

## 2015-11-25 NOTE — Progress Notes (Signed)
Anti-Coagulation Progress Note  [name] is a 75 y.o. female who is currently on an anti-coagulation regimen.    RECENT RESULTS: Recent results are below, the most recent result is correlated with a dose of 41.25 mg. per week: Lab Results  Component Value Date   INR 3.0 11/25/2015   INR 2.70 11/18/2015   INR 1.9* 11/15/2015    ANTI-COAG DOSE: Anticoagulation Dose Instructions as of 11/25/2015      Glynis SmilesSun Mon Tue Wed Thu Fri Sat   New Dose 3.75 mg 7.5 mg 3.75 mg 7.5 mg 3.75 mg 7.5 mg 3.75 mg       ANTICOAG SUMMARY: Anticoagulation Episode Summary    Current INR goal 2.0-3.0  Next INR check 12/09/2015  INR from last check 3.0 (11/25/2015)  Weekly max dose   Target end date Indefinite  [facility]  Preferred lab   Send INR reminders to    Indications  Acute saddle pulmonary embolism with acute cor pulmonale (HCC) [I26.02]        Comments Per discharge summary for her acute PE, indicates 3 months warfarin, then switch to rivaroxaban for indeterminate length of therapy in setting of recurrence of VTE        ANTICOAG TODAY: Anticoagulation Summary as of 11/25/2015    INR goal 2.0-3.0  Selected INR 3.0 (11/25/2015)  Next INR check 12/09/2015  Target end date Indefinite   Indications  Acute saddle pulmonary embolism with acute cor pulmonale (HCC) [I26.02]      Anticoagulation Episode Summary    [facility]   Preferred lab    Send INR reminders to    Comments Per discharge summary for her acute PE, indicates 3 months warfarin, then switch to rivaroxaban for indeterminate length of therapy in setting of recurrence of VTE      PATIENT INSTRUCTIONS: Patient Instructions  Patient instructed to take medications as defined in the Anti-coagulation Track section of this encounter.  Patient instructed to take today's dose.  Patient verbalized understanding of these instructions.       FOLLOW-UP Return in 2 weeks (on 12/09/2015)  for Follow up INR at 0945h.  Hulen LusterJames Mclain Freer, III Pharm.D., CACP

## 2015-11-25 NOTE — Patient Instructions (Signed)
Thank you for your visit today.   Please return to the internal medicine clinic in about 2-3 month(s) or sooner if needed.     I have made the following additions/changes to your medications:  Continue current medications.   Please be sure to bring all of your medications with you to every visit; this includes herbal supplements, vitamins, eye drops, and any over-the-counter medications.   Should you have any questions regarding your medications and/or any new or worsening symptoms, please be sure to call the clinic at [phone].   If you believe that you are suffering from a life threatening condition or one that may result in the loss of limb or function, then you should call 911 and proceed to the nearest Emergency Department.   A healthy lifestyle and preventative care can promote health and wellness.   Maintain regular health, dental, and eye exams.  Eat a healthy diet. Foods like vegetables, fruits, whole grains, low-fat dairy products, and lean protein foods contain the nutrients you need without too many calories. Decrease your intake of foods high in solid fats, added sugars, and salt. Get information about a proper diet from your caregiver, if necessary.  Regular physical exercise is one of the most important things you can do for your health. Most adults should get at least 150 minutes of moderate-intensity exercise (any activity that increases your heart rate and causes you to sweat) each week. In addition, most adults need muscle-strengthening exercises on 2 or more days a week.   Maintain a healthy weight. The body mass index (BMI) is a screening tool to identify possible weight problems. It provides an estimate of body fat based on height and weight. Your caregiver can help determine your BMI, and can help you achieve or maintain a healthy weight. For adults 20 years and older:  A BMI below 18.5 is considered underweight.  A BMI of 18.5 to 24.9 is normal.  A BMI of 25  to 29.9 is considered overweight.  A BMI of 30 and above is considered obese.

## 2015-11-25 NOTE — Progress Notes (Addendum)
Patient ID: Tina Chase, female   DOB: [birth year], 75 y.o.   MRN: [id number]     Subjective:   Patient ID: Tina Chase female    DOB: 04/20/1941 75 y.o.    MRN: [id number] Health Maintenance Due: Health Maintenance Due  Topic Date Due  . ZOSTAVAX  10/22/2002    _________________________________________________  HPI: [name] is a 75 y.o. female here for hospital f/u.  Pt has a PMH outlined below.  Please see problem-based charting assessment and plan for further status of patient's chronic medical problems addressed at today's visit.  PMH: Past Medical History  Diagnosis Date  . Hyperlipidemia   . Hypertension   . Hyperparathyroidism   . Obesity   . Hyperglycemia   . Hypercalcemia   . Asthma   . Shortness of breath     wtih exertion   . Chronic kidney disease     right non functioning kidney   . Arthritis     left knee  and left shoulder   . Myocardial infarction (HCC)   . Pulmonary embolism (HCC)     Provoked 2013  . CHF (congestive heart failure) (HCC)     Medications: Current Outpatient Prescriptions on File Prior to Visit  Medication Sig Dispense Refill  . acetaminophen (TYLENOL) 325 MG tablet Take 325 mg by mouth every 6 (six) hours as needed for mild pain. Reported on 11/25/2015    . albuterol (PROVENTIL HFA;VENTOLIN HFA) 108 (90 Base) MCG/ACT inhaler Inhale 2 puffs into the lungs every 4 (four) hours as needed for wheezing or shortness of breath. 1 Inhaler 3  . allopurinol (ZYLOPRIM) 300 MG tablet Take 300 mg by mouth daily.  3  . amLODipine (NORVASC) 10 MG tablet Take 1 tablet (10 mg total) by mouth daily. 90 tablet 6  . budesonide (PULMICORT) 0.25 MG/2ML nebulizer solution Take 2 mLs (0.25 mg total) by nebulization 2 (two) times daily. (Patient not taking: Reported on 11/25/2015) 60 mL 0  . Elastic Bandages & Supports (MEDICAL COMPRESSION STOCKINGS) MISC Please place on feet as needed for swelling. (Patient not taking: Reported on 11/25/2015) 2 each  0  . furosemide (LASIX) 20 MG tablet Take 2 tablets (40 mg total) by mouth daily. 90 tablet 3  . guaiFENesin (MUCINEX) 600 MG 12 hr tablet Take 1 tablet (600 mg total) by mouth 2 (two) times daily. 14 tablet 0  . loperamide (IMODIUM) 2 MG capsule Take 1 capsule (2 mg total) by mouth daily as needed for diarrhea or loose stools. (Patient not taking: Reported on 11/25/2015) 60 capsule 5  . metoprolol 75 MG TABS Take 75 mg by mouth 2 (two) times daily. 60 tablet 5  . Potassium Citrate 15 MEQ (1620 MG) TBCR Take 2 tablets by mouth 2 (two) times daily. 120 tablet   . warfarin (COUMADIN) 7.5 MG tablet Take 1 tablet daily EXCEPT on Wednesdays and Fridays--take only 1/2 tablet on those days. 24 tablet 1   No current facility-administered medications on file prior to visit.    Allergies: Allergies  Allergen Reactions  . Hydralazine Anaphylaxis    Swelling tongue.  . Aspirin Other (See Comments)    REACTION: hives  . Crab [Shellfish Allergy] Hives    Can eat shrimp and fish and lobster  . Lisinopril Swelling    REACTION: angioedema Tongue swelling    FH: Family History  Problem Relation Age of Onset  . Stroke Neg Hx   . Cancer Neg Hx   . Nephrolithiasis  Neg Hx   . Hyperlipidemia Mother   . Hypertension Mother   . Heart disease Father     had a heart attack at age 73  . Heart attack Father 3052  . Heart disease Brother     at age 73  . Heart attack Brother 5050    SH: Social History   Social History  . Marital Status: Single    Spouse Name: N/A  . Number of Children: N/A  . Years of Education: N/A   Occupational History  . retired     LawyerCNA   Social History Main Topics  . Smoking status: Former Smoker    Types: Cigarettes    Quit date: 08/03/1968  . Smokeless tobacco: Never Used  . Alcohol Use: No  . Drug Use: No  . Sexual Activity: Not Currently   Other Topics Concern  . None   Social History Narrative    Review of Systems: Constitutional: Negative for fever,  chills.  Respiratory: Negative for cough and shortness of breath.  Cardiovascular: Negative for chest pain.  Gastrointestinal: Negative for nausea, vomiting. Neurological: Negative for dizziness.   Objective:   Vital Signs: Filed Vitals:   11/25/15 1034  BP: 129/76  Pulse: 72  Temp: 98.5 F (36.9 C)  TempSrc: Oral  Height: 5\' 4"  (1.626 m)  Weight: 312 lb 9.6 oz (141.794 kg)  SpO2: 96%      BP Readings from Last 3 Encounters:  11/25/15 129/76  11/12/15 147/70  10/25/15 117/66    Physical Exam: Constitutional: Vital signs reviewed.  Patient is in NAD and cooperative with exam.  Head: Normocephalic and atraumatic. Eyes: EOMI, conjunctivae nl, no scleral icterus.  Neck: Supple. Cardiovascular: RRR, no MRG. Pulmonary/Chest: normal effort, CTAB, no wheezes, rales, or rhonchi. Abdominal: Soft. NT/ND +BS. Neurological: A&O x3, cranial nerves II-XII are grossly intact, moving all extremities. Extremities: No LE edema. Skin: Warm, dry and intact. No rash.   Assessment & Plan:   Assessment and plan was discussed and formulated with my attending.

## 2015-11-25 NOTE — Assessment & Plan Note (Addendum)
Pt presents for hospital f/u for a right submassive PE, almost saddle embolus.  This is 2nd PE (1st in 2013).  She was on heparin gtt and transferred to ICU.  She was initially on xarelto but due to her weight she was started on coumadin.  INR today 3.0.  She had RV pressure overload , mild RA dilation, and PA pressure of 87 mmHg on echo.  Denies chest pain or difficulty breathing currently.   -repeat echo in 3 months -cont coumadin f/u with Dr. Alexandria LodgeGroce to recheck INR -return for f/u with Dr. Oswaldo DoneVincent in June

## 2015-11-25 NOTE — Progress Notes (Signed)
Indication: Recurrent venous thromboembolism. Duration: Indefinite. INR: At target. Agree with Dr. Groce's assessment and plan. 

## 2015-11-25 NOTE — Patient Instructions (Signed)
Patient instructed to take medications as defined in the Anti-coagulation Track section of this encounter.  Patient instructed to take today's dose.  Patient verbalized understanding of these instructions.    

## 2015-11-25 NOTE — Assessment & Plan Note (Signed)
BP well controlled.  -cont current meds 

## 2015-11-25 NOTE — Progress Notes (Signed)
Case discussed with Dr. Delane GingerGill soon after the resident saw the patient. We reviewed the resident's history and exam and pertinent patient test results. I agree with the assessment, diagnosis, and plan of care documented in the resident's note with the following caveat:  I do not believe a follow-up Echo after a PE is evidence or guideline based if the patient is asymptomatic from a cardiopulmonary standpoint.

## 2015-12-06 ENCOUNTER — Other Ambulatory Visit: Payer: Self-pay | Admitting: Student in an Organized Health Care Education/Training Program

## 2015-12-09 ENCOUNTER — Ambulatory Visit (INDEPENDENT_AMBULATORY_CARE_PROVIDER_SITE_OTHER): Payer: Medicare Other | Admitting: Pharmacist

## 2015-12-09 DIAGNOSIS — Z7901 Long term (current) use of anticoagulants: Secondary | ICD-10-CM | POA: Diagnosis not present

## 2015-12-09 DIAGNOSIS — I2602 Saddle embolus of pulmonary artery with acute cor pulmonale: Secondary | ICD-10-CM

## 2015-12-09 LAB — POCT INR: INR: 2.4

## 2015-12-09 NOTE — Patient Instructions (Signed)
Patient instructed to take medications as defined in the Anti-coagulation Track section of this encounter.  Patient instructed to take today's dose.  Patient verbalized understanding of these instructions.    

## 2015-12-09 NOTE — Progress Notes (Signed)
INTERNAL MEDICINE TEACHING ATTENDING ADDENDUM - Earl LagosNischal Dhanya Bogle M.D  Duration- indefinite, Indication- saddle PE, INR- therapeutic. Agree with pharmacy recommendations as outlined in their note.

## 2015-12-09 NOTE — Progress Notes (Signed)
Anti-Coagulation Progress Note  [name] is a 75 y.o. female who is currently on an anti-coagulation regimen.    RECENT RESULTS: Recent results are below, the most recent result is correlated with a dose of 37.5 mg. per week: Lab Results  Component Value Date   INR 2.40 12/09/2015   INR 3.0 11/25/2015   INR 2.70 11/18/2015    ANTI-COAG DOSE: Anticoagulation Dose Instructions as of 12/09/2015      Glynis SmilesSun Mon Tue Wed Thu Fri Sat   New Dose 3.75 mg 7.5 mg 3.75 mg 7.5 mg 3.75 mg 7.5 mg 3.75 mg       ANTICOAG SUMMARY: Anticoagulation Episode Summary    Current INR goal 2.0-3.0  Next INR check 12/23/2015  INR from last check 2.40 (12/09/2015)  Weekly max dose   Target end date Indefinite  [facility]  Preferred lab   Send INR reminders to    Indications  Acute saddle pulmonary embolism with acute cor pulmonale (HCC) [I26.02]        Comments Per discharge summary for her acute PE, indicates 3 months warfarin, then switch to rivaroxaban for indeterminate length of therapy in setting of recurrence of VTE        ANTICOAG TODAY: Anticoagulation Summary as of 12/09/2015    INR goal 2.0-3.0  Selected INR 2.40 (12/09/2015)  Next INR check 12/23/2015  Target end date Indefinite   Indications  Acute saddle pulmonary embolism with acute cor pulmonale (HCC) [I26.02]      Anticoagulation Episode Summary    [facility]   Preferred lab    Send INR reminders to    Comments Per discharge summary for her acute PE, indicates 3 months warfarin, then switch to rivaroxaban for indeterminate length of therapy in setting of recurrence of VTE      PATIENT INSTRUCTIONS: Patient Instructions  Patient instructed to take medications as defined in the Anti-coagulation Track section of this encounter.  Patient instructed to take today's dose.  Patient verbalized understanding of these instructions.       FOLLOW-UP Return in 2 weeks (on 12/23/2015)  for Follow up INR at 1000h.Hulen Luster.  Herby Amick, III Pharm.D., CACP

## 2015-12-13 DIAGNOSIS — J9601 Acute respiratory failure with hypoxia: Secondary | ICD-10-CM | POA: Diagnosis not present

## 2015-12-23 ENCOUNTER — Ambulatory Visit (INDEPENDENT_AMBULATORY_CARE_PROVIDER_SITE_OTHER): Payer: Medicare Other | Admitting: Pharmacist

## 2015-12-23 DIAGNOSIS — I2602 Saddle embolus of pulmonary artery with acute cor pulmonale: Secondary | ICD-10-CM | POA: Diagnosis not present

## 2015-12-23 DIAGNOSIS — Z7901 Long term (current) use of anticoagulants: Secondary | ICD-10-CM | POA: Diagnosis not present

## 2015-12-23 LAB — POCT INR: INR: 1.9

## 2015-12-23 NOTE — Progress Notes (Signed)
Anti-Coagulation Progress Note  Tina BlendRebecca J Galka is a 75 y.o. female who is currently on an anti-coagulation regimen.    RECENT RESULTS: Recent results are below, the most recent result is correlated with a dose of 37.5 mg. per week: Lab Results  Component Value Date   INR 1.90 12/23/2015   INR 2.40 12/09/2015   INR 3.0 11/25/2015    ANTI-COAG DOSE: Anticoagulation Dose Instructions as of 12/23/2015      Glynis SmilesSun Mon Tue Wed Thu Fri Sat   New Dose 3.75 mg 7.5 mg 7.5 mg 7.5 mg 3.75 mg 7.5 mg 3.75 mg       ANTICOAG SUMMARY: Anticoagulation Episode Summary    Current INR goal 2.0-3.0  Next INR check 01/06/2016  INR from last check 1.90! (12/23/2015)  Weekly max dose   Target end date Indefinite  [facility]  Preferred lab   Send INR reminders to    Indications  Acute saddle pulmonary embolism with acute cor pulmonale (HCC) [I26.02]        Comments Per discharge summary for her acute PE, indicates 3 months warfarin, then switch to rivaroxaban for indeterminate length of therapy in setting of recurrence of VTE        ANTICOAG TODAY: Anticoagulation Summary as of 12/23/2015    INR goal 2.0-3.0  Selected INR 1.90! (12/23/2015)  Next INR check 01/06/2016  Target end date Indefinite   Indications  Acute saddle pulmonary embolism with acute cor pulmonale (HCC) [I26.02]      Anticoagulation Episode Summary    [facility]   Preferred lab    Send INR reminders to    Comments Per discharge summary for her acute PE, indicates 3 months warfarin, then switch to rivaroxaban for indeterminate length of therapy in setting of recurrence of VTE      PATIENT INSTRUCTIONS: Patient Instructions  Patient instructed to take medications as defined in the Anti-coagulation Track section of this encounter.  Patient instructed to take today's dose.  Patient verbalized understanding of these instructions.       FOLLOW-UP Return in 2 weeks (on 01/06/2016)  for Follow up INR at 0845h.  Hulen LusterJames Groce, III Pharm.D., CACP

## 2015-12-23 NOTE — Patient Instructions (Signed)
Patient instructed to take medications as defined in the Anti-coagulation Track section of this encounter.  Patient instructed to take today's dose.  Patient verbalized understanding of these instructions.    

## 2015-12-24 DIAGNOSIS — H10413 Chronic giant papillary conjunctivitis, bilateral: Secondary | ICD-10-CM | POA: Diagnosis not present

## 2015-12-24 DIAGNOSIS — H2513 Age-related nuclear cataract, bilateral: Secondary | ICD-10-CM | POA: Diagnosis not present

## 2015-12-24 DIAGNOSIS — H11123 Conjunctival concretions, bilateral: Secondary | ICD-10-CM | POA: Diagnosis not present

## 2015-12-26 NOTE — Progress Notes (Signed)
INTERNAL MEDICINE TEACHING ATTENDING ADDENDUM - Earl LagosNischal Ceonna Frazzini M.D  Duration- indefinite, Indication- saddle PE, INR- subtherapeutic. Agree with pharmacy recommendations as outlined in their note.

## 2016-01-06 ENCOUNTER — Encounter: Payer: Self-pay | Admitting: Student in an Organized Health Care Education/Training Program

## 2016-01-06 ENCOUNTER — Ambulatory Visit (INDEPENDENT_AMBULATORY_CARE_PROVIDER_SITE_OTHER): Payer: Medicare Other | Admitting: Pharmacist

## 2016-01-06 ENCOUNTER — Ambulatory Visit (INDEPENDENT_AMBULATORY_CARE_PROVIDER_SITE_OTHER): Payer: Medicare Other | Admitting: Student in an Organized Health Care Education/Training Program

## 2016-01-06 VITALS — BP 132/80 | HR 74 | Temp 98.3°F | Ht 64.0 in | Wt 322.1 lb

## 2016-01-06 DIAGNOSIS — R600 Localized edema: Secondary | ICD-10-CM | POA: Diagnosis not present

## 2016-01-06 DIAGNOSIS — Z7901 Long term (current) use of anticoagulants: Secondary | ICD-10-CM

## 2016-01-06 DIAGNOSIS — I2782 Chronic pulmonary embolism: Secondary | ICD-10-CM

## 2016-01-06 DIAGNOSIS — I2699 Other pulmonary embolism without acute cor pulmonale: Secondary | ICD-10-CM

## 2016-01-06 DIAGNOSIS — I10 Essential (primary) hypertension: Secondary | ICD-10-CM | POA: Diagnosis not present

## 2016-01-06 DIAGNOSIS — J45909 Unspecified asthma, uncomplicated: Secondary | ICD-10-CM

## 2016-01-06 DIAGNOSIS — G4730 Sleep apnea, unspecified: Secondary | ICD-10-CM

## 2016-01-06 DIAGNOSIS — J4530 Mild persistent asthma, uncomplicated: Secondary | ICD-10-CM

## 2016-01-06 DIAGNOSIS — E785 Hyperlipidemia, unspecified: Secondary | ICD-10-CM

## 2016-01-06 LAB — POCT INR: INR: 2.2

## 2016-01-06 MED ORDER — WARFARIN SODIUM 7.5 MG PO TABS: ORAL_TABLET | ORAL | Status: DC

## 2016-01-06 MED ORDER — FLUTICASONE PROPIONATE HFA 44 MCG/ACT IN AERO: 2.0000 | INHALATION_SPRAY | Freq: Every day | RESPIRATORY_TRACT | Status: DC

## 2016-01-06 NOTE — Progress Notes (Signed)
Anti-Coagulation Progress Note  Tina BlendRebecca J Saxby is a 75 y.o. female who is currently on an anti-coagulation regimen.    RECENT RESULTS: Recent results are below, the most recent result is correlated with a dose of 41.25 mg. per week: Lab Results  Component Value Date   INR 2.20 01/06/2016   INR 1.90 12/23/2015   INR 2.40 12/09/2015    ANTI-COAG DOSE: Anticoagulation Dose Instructions as of 01/06/2016      Glynis SmilesSun Mon Tue Wed Thu Fri Sat   New Dose 3.75 mg 7.5 mg 7.5 mg 7.5 mg 7.5 mg 7.5 mg 3.75 mg       ANTICOAG SUMMARY: Anticoagulation Episode Summary    Current INR goal 2.0-3.0  Next INR check 01/27/2016  INR from last check 2.20 (01/06/2016)  Weekly max dose   Target end date Indefinite  [facility]  Preferred lab   Send INR reminders to    Indications  Pulmonary embolism (HCC) [I26.99]        Comments Per discharge summary for her acute PE, indicates 3 months warfarin, then switch to rivaroxaban for indeterminate length of therapy in setting of recurrence of VTE        ANTICOAG TODAY: Anticoagulation Summary as of 01/06/2016    INR goal 2.0-3.0  Selected INR 2.20 (01/06/2016)  Next INR check 01/27/2016  Target end date Indefinite   Indications  Pulmonary embolism (HCC) [I26.99]      Anticoagulation Episode Summary    [facility] check location [facility]   Preferred lab    Send INR reminders to    Comments Per discharge summary for her acute PE, indicates 3 months warfarin, then switch to rivaroxaban for indeterminate length of therapy in setting of recurrence of VTE      PATIENT INSTRUCTIONS: Patient Instructions  Patient instructed to take medications as defined in the Anti-coagulation Track section of this encounter.  Patient instructed to take today's dose.  Patient verbalized understanding of these instructions.       FOLLOW-UP Return in 3 weeks (on 01/27/2016) for Follow up INR at 0900h.  Hulen LusterJames Moselle Rister, III Pharm.D., CACP

## 2016-01-06 NOTE — Assessment & Plan Note (Signed)
Blood pressure a little difficult to gauge in office as she needs a very large cuff due to obesity. When I used our largest cuff for repeat measurements I see her pressure is around 132/80. We probably have adequate control of her hypertension. Plan is to continue amlodipine and metoprolol at current doses.

## 2016-01-06 NOTE — Patient Instructions (Signed)
1. Start using the Flovent inhaler once daily for your asthma  2. We will schedule a sleep study to treat your sleep apnea.   3. Keep taking the coumadin and follow up with Dr. Alexandria LodgeGroce for your levels.   4. Keep taking your blood pressure medicines like you are.

## 2016-01-06 NOTE — Assessment & Plan Note (Signed)
Patient with chronic bilateral lower extremity lipedema which she reports as being worse than usual. A PVL during recent admission was negative for DVT. On measurements today the left calf is 62 cm and right calf is 58 cm. She reports the left leg is always larger than the right for many years. We'll watch these very carefully, there certainly a risk for future DVTs. Continue lasix 20mg  daily for symptomatic relief. I advised compression stockings again, but she has difficulty getting them on and off.

## 2016-01-06 NOTE — Assessment & Plan Note (Signed)
Patient reports using rescue inhaler several times daily for wheezing and shortness of breath. Little hard to tell if her shortness of breath may be coming from the recent pulmonary embolism. She does also get seasonal allergies. I don't appreciate much wheezing on exam today. Plan is to start fluticasone inhaler to step up therapy to try to improve control over her shortness of breath symptoms. Continue albuterol as needed. I spent several minutes talking about appropriate inhaler technique. She'll return to clinic in 3 months and we can monitor her response.

## 2016-01-06 NOTE — Progress Notes (Signed)
   See Encounters tab for problem-based medical decision making  __________________________________________________________  HPI:   75 year old woman here for follow-up of pulmonary embolism. She was hospitalized in April because of acute hypoxic respiratory failure found to be due to large unprovoked pulmonary embolism. She was discharged with Coumadin for anticoagulation and follows with our anticoagulation clinic with good compliance. Since discharge she has been doing fairly well. Still has difficulty with exertional capacity because of shortness of breath. She feels she is wheezing a lot and uses her albuterol rescue inhaler several times a day. She also reports significant daytime somnolence, so she naps in her couch several times a day. She's gained about 10 pounds since discharge, she denies increased swelling of either of her lower extremities. Says her left leg is always larger than her right. Denies orthopnea or PND, sleeps on one pillow. No chest pain with exertion. Eating and drinking well. She lives by herself in an apartment in a public housing units. Says that smoker mother departments to bother her but the rules are changing in the future banding all tobacco use throughout the building. Reports good social support, likes to walk with friends during the day.   __________________________________________________________  Problem List: Patient Active Problem List   Diagnosis Date Noted  . Pulmonary embolism (HCC)   . Sleep apnea 07/08/2015  . Bilateral lower extremity edema 01/09/2014  . 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 Nephrolithiasis 05/25/2013  . Hypertension 12/22/2011  . Obesity, Class III, BMI 40-49.9 (morbid obesity) (HCC) 12/22/2011  . Asthma 12/04/2011  . Single kidney 09/15/2011  . Routine health maintenance 09/15/2011  . Dyslipidemia 08/13/2006    Medications: Reconciled today in Epic __________________________________________________________  Physical Exam:  Vital  Signs: Filed Vitals:   01/06/16 0824 01/06/16 0945  BP: 156/86 132/80  Pulse: 74   Temp: 98.3 F (36.8 C)   TempSrc: Oral   Height: 5\' 4"  (1.626 m)   Weight: 322 lb 1.6 oz (146.104 kg)   SpO2: 99%     Gen: Well appearing, NAD CV: RRR, no murmurs Pulm: Normal effort, CTA throughout, no wheezing Abd: Soft, NT, ND, normal BS.  Ext: Warm, chronic lipedema bilaterally, left calf 62cm, right calf 58cm Skin: No atypical appearing moles. No rashes

## 2016-01-06 NOTE — Assessment & Plan Note (Addendum)
Patient hospitalized April 2017 for acute hypoxia due to a large unprovoked pulmonary embolism. She was anticoagulated with Coumadin. Since discharge she's been doing fairly well. Still has difficulty with her exertional capacity. Good compliance with Coumadin therapy, [facility]. I don't think a DOAC will be a good option for her in the future because she is so obese and these medicines are not well studied in that population. If she develops a large Coumadin hate factor we can consider using a DOAC empirically. I would plan for life long indefinite anticoagulation.

## 2016-01-06 NOTE — Progress Notes (Signed)
INTERNAL MEDICINE TEACHING ATTENDING ADDENDUM - Earl LagosNischal Corra Kaine M.D  Duration- indefinite, Indication- acute PE, INR- therapeutic. Agree with pharmacy recommendations as outlined in their note.

## 2016-01-06 NOTE — Assessment & Plan Note (Signed)
She has symptoms of daytime somnolence and an exam that is classic for untreated sleep apnea. Nursing notes during recent hospitalization identified clear apneic episodes. I've offered her sleep studies in the past, today she is willing to comply. I ordered a split night polysomnography with CPAP titration and I will follow-up for future DME orders.

## 2016-01-06 NOTE — Patient Instructions (Signed)
Patient instructed to take medications as defined in the Anti-coagulation Track section of this encounter.  Patient instructed to take today's dose.  Patient verbalized understanding of these instructions.    

## 2016-01-13 DIAGNOSIS — J9601 Acute respiratory failure with hypoxia: Secondary | ICD-10-CM | POA: Diagnosis not present

## 2016-01-22 ENCOUNTER — Other Ambulatory Visit: Payer: Self-pay | Admitting: Internal Medicine

## 2016-01-27 ENCOUNTER — Ambulatory Visit (INDEPENDENT_AMBULATORY_CARE_PROVIDER_SITE_OTHER): Payer: Medicare Other | Admitting: Pharmacist

## 2016-01-27 DIAGNOSIS — I2699 Other pulmonary embolism without acute cor pulmonale: Secondary | ICD-10-CM | POA: Diagnosis not present

## 2016-01-27 DIAGNOSIS — Z7901 Long term (current) use of anticoagulants: Secondary | ICD-10-CM | POA: Diagnosis not present

## 2016-01-27 LAB — POCT INR: INR: 2.7

## 2016-01-27 NOTE — Progress Notes (Signed)
Anti-Coagulation Progress Note  [name] is a 75 y.o. female who is currently on an anti-coagulation regimen.    RECENT RESULTS: Recent results are below, the most recent result is correlated with a dose of 45 mg. per week: Lab Results  Component Value Date   INR 2.70 01/27/2016   INR 2.20 01/06/2016   INR 1.90 12/23/2015    ANTI-COAG DOSE: Anticoagulation Dose Instructions as of 01/27/2016      Glynis SmilesSun Mon Tue Wed Thu Fri Sat   New Dose 3.75 mg 7.5 mg 7.5 mg 7.5 mg 7.5 mg 7.5 mg 3.75 mg       ANTICOAG SUMMARY: Anticoagulation Episode Summary    Current INR goal 2.0-3.0  Next INR check 03/02/2016  INR from last check 2.70 (01/27/2016)  Weekly max dose   Target end date Indefinite  [facility]  Preferred lab   Send INR reminders to    Indications  Pulmonary embolism (HCC) [I26.99]        Comments Per discharge summary for her acute PE, indicates 3 months warfarin, then switch to rivaroxaban for indeterminate length of therapy in setting of recurrence of VTE        ANTICOAG TODAY: Anticoagulation Summary as of 01/27/2016    INR goal 2.0-3.0  Selected INR 2.70 (01/27/2016)  Next INR check 03/02/2016  Target end date Indefinite   Indications  Pulmonary embolism (HCC) [I26.99]      Anticoagulation Episode Summary    INR check location [facility]   Preferred lab    Send INR reminders to    Comments Per discharge summary for her acute PE, indicates 3 months warfarin, then switch to rivaroxaban for indeterminate length of therapy in setting of recurrence of VTE      PATIENT INSTRUCTIONS: Patient Instructions  Patient instructed to take medications as defined in the Anti-coagulation Track section of this encounter.  Patient instructed to take today's dose.  Patient verbalized understanding of these instructions.       FOLLOW-UP Return in 5 weeks (on 03/02/2016) for Follow up INR at 0915h.  Hulen LusterJames Jolisa Intriago, III Pharm.D., CACP

## 2016-01-27 NOTE — Patient Instructions (Signed)
Patient instructed to take medications as defined in the Anti-coagulation Track section of this encounter.  Patient instructed to take today's dose.  Patient verbalized understanding of these instructions.    

## 2016-02-11 ENCOUNTER — Ambulatory Visit (HOSPITAL_BASED_OUTPATIENT_CLINIC_OR_DEPARTMENT_OTHER)
Payer: Medicare Other | Attending: Student in an Organized Health Care Education/Training Program | Admitting: Internal Medicine

## 2016-02-11 DIAGNOSIS — G4730 Sleep apnea, unspecified: Secondary | ICD-10-CM

## 2016-02-11 DIAGNOSIS — G4733 Obstructive sleep apnea (adult) (pediatric): Secondary | ICD-10-CM | POA: Insufficient documentation

## 2016-02-11 DIAGNOSIS — R0683 Snoring: Secondary | ICD-10-CM | POA: Insufficient documentation

## 2016-02-11 DIAGNOSIS — G4736 Sleep related hypoventilation in conditions classified elsewhere: Secondary | ICD-10-CM | POA: Insufficient documentation

## 2016-02-11 DIAGNOSIS — R5383 Other fatigue: Secondary | ICD-10-CM | POA: Insufficient documentation

## 2016-02-11 DIAGNOSIS — I10 Essential (primary) hypertension: Secondary | ICD-10-CM | POA: Insufficient documentation

## 2016-02-11 DIAGNOSIS — E669 Obesity, unspecified: Secondary | ICD-10-CM | POA: Diagnosis not present

## 2016-02-11 DIAGNOSIS — F513 Sleepwalking [somnambulism]: Secondary | ICD-10-CM | POA: Insufficient documentation

## 2016-02-11 DIAGNOSIS — Z6841 Body Mass Index (BMI) 40.0 and over, adult: Secondary | ICD-10-CM | POA: Diagnosis not present

## 2016-02-12 DIAGNOSIS — J9601 Acute respiratory failure with hypoxia: Secondary | ICD-10-CM | POA: Diagnosis not present

## 2016-02-13 DIAGNOSIS — R8299 Other abnormal findings in urine: Secondary | ICD-10-CM | POA: Diagnosis not present

## 2016-02-20 DIAGNOSIS — N200 Calculus of kidney: Secondary | ICD-10-CM | POA: Diagnosis not present

## 2016-02-22 DIAGNOSIS — G4730 Sleep apnea, unspecified: Secondary | ICD-10-CM

## 2016-02-22 NOTE — Procedures (Signed)
  Patient Name: Tina Chase, Tina Chase Date: 02/11/2016 Gender: Female D.O.B: 06/30/1941 Age (years): 75 Referring Provider: Tyson Alias Height (inches): 63 Interpreting Physician: Jetty Duhamel MD, ABSM Weight (lbs): 316 RPSGT: Shelah Lewandowsky BMI: 56 MRN: [id number] Neck Size: 16.00 CLINICAL INFORMATION Sleep Study Type: NPSG Indication for sleep study: Fatigue, Hypertension, Obesity, Sleep walking/talking/parasomnias, Snoring, Witnessed Apneas Epworth Sleepiness Score: 10  SLEEP STUDY TECHNIQUE As per the AASM Manual for the Scoring of Sleep and Associated Events v2.3 (April 2016) with a hypopnea requiring 4% desaturations. The channels recorded and monitored were frontal, central and occipital EEG, electrooculogram (EOG), submentalis EMG (chin), nasal and oral airflow, thoracic and abdominal wall motion, anterior tibialis EMG, snore microphone, electrocardiogram, and pulse oximetry.  MEDICATIONS Patient's medications include: charted for review. Medications self-administered by patient during sleep study : No sleep medicine administered.  SLEEP ARCHITECTURE The study was initiated at 10:56:29 PM and ended at 4:54:53 AM. Sleep onset time was 85.9 minutes and the sleep efficiency was 22.2%. The total sleep time was 79.5 minutes. Stage REM latency was N/A minutes. The patient spent 54.09% of the night in stage N1 sleep, 45.91% in stage N2 sleep, 0.00% in stage N3 and 0.00% in REM. Alpha intrusion was absent. Supine sleep was 27.04%. Wake after sleep onset 193 minutes  RESPIRATORY PARAMETERS The overall apnea/hypopnea index (AHI) was 124.5 per hour. There were 5 total apneas, including 4 obstructive, 1 central and 0 mixed apneas. There were 160 hypopneas and 14 RERAs. The AHI during Stage REM sleep was N/A per hour. AHI while supine was 136.7 per hour. The mean oxygen saturation was 87.10%. The minimum SpO2 during sleep was 80.00%. Moderate snoring was noted during  this study.  CARDIAC DATA The 2 lead EKG demonstrated sinus rhythm. The mean heart rate was 72.87 beats per minute. Other EKG findings include: None.  LEG MOVEMENT DATA The total PLMS were 0 with a resulting PLMS index of 0.00. Associated arousal with leg movement index was 0.0 .  IMPRESSIONS - Severe obstructive sleep apnea occurred during this study (AHI = 124.5/h). - No significant central sleep apnea occurred during this study (CAI = 0.8/h). - Moderate oxygen desaturation was noted during this study (Min O2 = 80.00%). - Significant difficulty initiating and maintaining sleep, - Insufficient early sleep to permit application of split CPAP prorocol. - The patient snored with Moderate snoring volume. - No cardiac abnormalities were noted during this study. - Clinically significant periodic limb movements did not occur during sleep. No significant associated arousals.  DIAGNOSIS - Obstructive Sleep Apnea (327.23 [G47.33 ICD-10]) - Nocturnal Hypoxemia (327.26 [G47.36 ICD-10])  RECOMMENDATIONS - Therapeutic CPAP titration to determine optimal pressure required to alleviate sleep disordered breathing. - Positional therapy avoiding supine position during sleep. - Avoid alcohol, sedatives and other CNS depressants that may worsen sleep apnea and disrupt normal sleep architecture. - Sleep hygiene should be reviewed to assess factors that may improve sleep quality. - Weight management and regular exercise should be initiated or continued if appropriate.  [Electronically signed] 02/22/2016 12:22 PM  Jetty Duhamel MD, ABSM Diplomate, American Board of Sleep Medicine   NPI: [id number] Waymon Budge Diplomate, American Board of Sleep Medicine  ELECTRONICALLY SIGNED ON:  02/22/2016, 12:22 PM [facility] PH: [phone]   FX: [phone] ACCREDITED BY THE AMERICAN ACADEMY OF SLEEP MEDICINE

## 2016-02-25 ENCOUNTER — Telehealth: Payer: Self-pay | Admitting: Student in an Organized Health Care Education/Training Program

## 2016-02-25 DIAGNOSIS — G4733 Obstructive sleep apnea (adult) (pediatric): Secondary | ICD-10-CM

## 2016-02-25 NOTE — Telephone Encounter (Signed)
I called Ms. Flight today about sleep study results which show severe OSA. This is a new diagnosis. They were unable to complete the CPAP titration protocol because of time restraints and we will need to arrange for a subsequent study. I have placed an order for that. Based on those results I will order her a CPAP machine.

## 2016-03-02 ENCOUNTER — Ambulatory Visit (INDEPENDENT_AMBULATORY_CARE_PROVIDER_SITE_OTHER): Payer: Medicare Other | Admitting: Pharmacist

## 2016-03-02 ENCOUNTER — Other Ambulatory Visit: Payer: Self-pay | Admitting: Student in an Organized Health Care Education/Training Program

## 2016-03-02 ENCOUNTER — Encounter (INDEPENDENT_AMBULATORY_CARE_PROVIDER_SITE_OTHER): Payer: Self-pay

## 2016-03-02 DIAGNOSIS — Z7901 Long term (current) use of anticoagulants: Secondary | ICD-10-CM | POA: Diagnosis not present

## 2016-03-02 DIAGNOSIS — I2699 Other pulmonary embolism without acute cor pulmonale: Secondary | ICD-10-CM

## 2016-03-02 LAB — POCT INR: INR: 2.6

## 2016-03-02 MED ORDER — WARFARIN SODIUM 7.5 MG PO TABS: ORAL_TABLET | ORAL | 1 refills | Status: DC

## 2016-03-02 NOTE — Patient Instructions (Signed)
Patient educated about medication as defined in this encounter and verbalized understanding by repeating back instructions provided.   

## 2016-03-02 NOTE — Progress Notes (Signed)
Anticoagulation Management Tina Chase is a 75 y.o. female who reports to the clinic for monitoring of warfarin treatment.    Indication: PE Duration: indefinite  Anticoagulation Clinic Visit History: Patient does not report signs/symptoms of bleeding or thromboembolism or any other changes. Anticoagulation Episode Summary    Current INR goal:   2.0-3.0  TTR:   92.2 % (3.2 mo)  Next INR check:   03/30/2016  INR from last check:   2.6 (03/02/2016)  Weekly max dose:     Target end date:   Indefinite  INR check location:   [facility]  Preferred lab:     Send INR reminders to:      Indications   Pulmonary embolism (HCC) [I26.99]       Comments:   Per discharge summary for her acute PE, indicates 3 months warfarin, then switch to rivaroxaban for indeterminate length of therapy in setting of recurrence of VTE       ASSESSMENT Recent Results: The most recent result is correlated with 45 mg per week: Lab Results  Component Value Date   INR 2.6 03/02/2016   INR 2.70 01/27/2016   INR 2.20 01/06/2016   Anticoagulation Dosing: INR as of 03/02/2016 and Previous Dosing Information    INR Dt INR Goal [facility] [name]   03/02/2016 2.6 2.0-3.0 45 mg 3.75 mg 7.5 mg 7.5 mg 7.5 mg 7.5 mg 7.5 mg 3.75 mg    Anticoagulation Dose Instructions as of 03/02/2016      Total [name]   New Dose 45 mg 3.75 mg 7.5 mg 7.5 mg 7.5 mg 7.5 mg 7.5 mg 3.75 mg     (7.5 mg x 0.5)  (7.5 mg x 1)  (7.5 mg x 1)  (7.5 mg x 1)  (7.5 mg x 1)  (7.5 mg x 1)  (7.5 mg x 0.5)                           INR today: Therapeutic  PLAN Weekly dose was unchanged. Prescription updated to reflect current dosing  Patient Instructions  Patient educated about medication as defined in this encounter and verbalized understanding by repeating back instructions provided.    Patient advised to contact clinic or seek medical attention if signs/symptoms of bleeding or  thromboembolism occur.  Patient verbalized understanding by repeating back information and was advised to contact me if further medication-related questions arise. Patient was also provided an information handout.  Follow-up Return in about 4 weeks (around 03/30/2016) for Follow up INR 03/30/2016 around 9am.  Tina Chase  15 minutes spent face-to-face with the patient during the encounter. 50% of time spent on education. 50% of time was spent on assessment and plan.

## 2016-03-12 NOTE — Progress Notes (Signed)
I have reviewed Dr. Elmyra RicksKim's note.  [facility]  INR at goal.

## 2016-03-14 DIAGNOSIS — J9601 Acute respiratory failure with hypoxia: Secondary | ICD-10-CM | POA: Diagnosis not present

## 2016-03-27 ENCOUNTER — Telehealth: Payer: Self-pay | Admitting: Student in an Organized Health Care Education/Training Program

## 2016-03-27 NOTE — Telephone Encounter (Signed)
APT. REMINDER CALL, LMTCB °

## 2016-03-30 ENCOUNTER — Ambulatory Visit (INDEPENDENT_AMBULATORY_CARE_PROVIDER_SITE_OTHER): Payer: Medicare Other | Admitting: Student in an Organized Health Care Education/Training Program

## 2016-03-30 ENCOUNTER — Encounter: Payer: Self-pay | Admitting: Student in an Organized Health Care Education/Training Program

## 2016-03-30 ENCOUNTER — Ambulatory Visit (INDEPENDENT_AMBULATORY_CARE_PROVIDER_SITE_OTHER): Payer: Medicare Other | Admitting: Pharmacist

## 2016-03-30 VITALS — BP 153/78 | HR 84 | Temp 97.9°F | Ht 64.0 in | Wt 317.6 lb

## 2016-03-30 DIAGNOSIS — E785 Hyperlipidemia, unspecified: Secondary | ICD-10-CM

## 2016-03-30 DIAGNOSIS — Z23 Encounter for immunization: Secondary | ICD-10-CM | POA: Diagnosis not present

## 2016-03-30 DIAGNOSIS — Z7901 Long term (current) use of anticoagulants: Secondary | ICD-10-CM

## 2016-03-30 DIAGNOSIS — I10 Essential (primary) hypertension: Secondary | ICD-10-CM

## 2016-03-30 DIAGNOSIS — Z9981 Dependence on supplemental oxygen: Secondary | ICD-10-CM

## 2016-03-30 DIAGNOSIS — Z86711 Personal history of pulmonary embolism: Secondary | ICD-10-CM | POA: Diagnosis not present

## 2016-03-30 DIAGNOSIS — R600 Localized edema: Secondary | ICD-10-CM | POA: Diagnosis not present

## 2016-03-30 DIAGNOSIS — G4730 Sleep apnea, unspecified: Secondary | ICD-10-CM | POA: Diagnosis not present

## 2016-03-30 DIAGNOSIS — I2699 Other pulmonary embolism without acute cor pulmonale: Secondary | ICD-10-CM

## 2016-03-30 LAB — POCT INR: INR: 3

## 2016-03-30 NOTE — Assessment & Plan Note (Signed)
Blood pressure above goal today. I am going to hold on escalating medications for now, I am hopeful that initiating CPAP therapy next month will improve blood pressure control. Will continue amlodipine and metoprolol for now. Recheck in 3 months.

## 2016-03-30 NOTE — Patient Instructions (Signed)
Patient instructed to take medications as defined in the Anti-coagulation Track section of this encounter.  Patient instructed to take today's dose.  Patient instructed to take 1/2 tablet (using her green 2.5mg  strength warfarin tablets) on Mondays, Wednesdays, Fridays; Take 1 tablet all other days.  Patient verbalized understanding of these instructions.

## 2016-03-30 NOTE — Assessment & Plan Note (Signed)
Sleep study in June positive for severe sleep apnea. There was not enough time that night to complete the CPAP titration, this is scheduled for September 21. I advised her to keep this appointment and we will initiate home CPAP once we have the settings.

## 2016-03-30 NOTE — Assessment & Plan Note (Signed)
Non-pitting edema. Right leg is 56 cm, and left is 60 cm, both a little smaller than last evaluation in June. Plan to continue lasix 40 mg daily for symptomatic relief. Weight is currently stable. I advised compression stockings.

## 2016-03-30 NOTE — Progress Notes (Signed)
Assessment and Plan:  See Encounters tab for problem-based medical decision making.   __________________________________________________________  HPI:  75 year old woman here for follow-up from unprovoked pulmonary embolism in April of this year. She is doing very well on Coumadin therapy. Compliant with Coumadin clinic and her INRs have been in an excellent range. She is compliant with the dietary restrictions though having some frustration, says that she would like to eat more greens besides just beans. Denies any bleeding, no easy bruising. She does complain that her legs are continued to be swollen, they're worse with ambulation. Still does not wear compression stockings She has difficulty putting them on. The left leg is chronically more swollen than the right, denies any changes in this. She feels her shortness of breath is stable. She still has an oxygen tank at home, but says that she doesn't use it very much anymore. No fevers or chills. No chest pain. She didn't complete her sleep study in July which showed severe sleep apnea, she has a scheduled follow-up to have CPAP titration at the end of September. For now she is still feeling very tired throughout the day due to the sleep apnea. Lives by herself and doing fairly well at home, seems to be very independent.  __________________________________________________________  Problem List: Patient Active Problem List   Diagnosis Date Noted  . History of pulmonary embolism     Priority: High  . Sleep apnea 07/08/2015    Priority: High  . Bilateral lower extremity edema 01/09/2014    Priority: Medium  . Hyperuricemia 05/25/2013    Priority: Medium  . Nephrolithiasis 05/25/2013    Priority: Medium  . Hypertension 12/22/2011    Priority: Medium  . Obesity, Class III, BMI 40-49.9 (morbid obesity) (HCC) 12/22/2011    Priority: Medium  . Asthma 12/04/2011    Priority: Low  . 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 Routine health  maintenance 09/15/2011    Priority: Low    Medications: Reconciled today in Epic __________________________________________________________  Physical Exam:  Vital Signs: Vitals:   03/30/16 0856  BP: (!) 153/78  Pulse: 84  Temp: 97.9 F (36.6 C)  TempSrc: Oral  SpO2: 95%  Weight: (!) 317 lb 9.6 oz (144.1 kg)  Height: 5\' 4"  (1.626 m)    Gen: Well appearing, NAD CV: RRR, no murmurs Pulm: Normal effort, CTA throughout, no wheezing, distant sounds Abd: Soft, NT, ND, normal BS.  Ext: Warm, chronic lipedema bilaterally, non-pitting, right leg circumference 56cm and left leg 60cm. Skin: No atypical appearing moles. No rashes

## 2016-03-30 NOTE — Patient Instructions (Signed)
1. You are doing great on coumadin, keep up the great work.   2. Please complete your sleep study and start CPAP at night when able.   3. Come see me again in three  Months.   4. Let me know if your leg swelling gets worse.   5. Try to exercise every day, walking is great.

## 2016-03-30 NOTE — Assessment & Plan Note (Signed)
Patient with an admission four months ago for a unprovoked large pulmonary embolism, currently doing well anticoagulated on Coumadin. She is a little frustrated with the dietary restrictions while on Coumadin. We talked about the risk of transitioning to a DOAC given her extreme obesity. For now I advised continuing Coumadin indefinitely, she may be willing to accept the risk and switch over to a DOAC in the future. She has home oxygen for exertion arranged [facility]. On ambulation in the clinic today she still desaturated to 88%; we will continue exertional supplemental oxygen at home for now and recheck ambulatory sats in three months.

## 2016-03-30 NOTE — Progress Notes (Signed)
Anti-Coagulation Progress Note  Tina Chase is a 75 y.o. female who is currently on an anti-coagulation regimen.    RECENT RESULTS: Recent results are below, the most recent result is correlated with a dose of 45 mg. per week: Lab Results  Component Value Date   INR 3.0 03/30/2016   INR 2.6 03/02/2016   INR 2.70 01/27/2016    ANTI-COAG DOSE: Anticoagulation Dose Instructions as of 03/30/2016      Glynis SmilesSun Mon Tue Wed Thu Fri Sat   New Dose 7.5 mg 3.75 mg 7.5 mg 3.75 mg 7.5 mg 3.75 mg 7.5 mg       ANTICOAG SUMMARY: Anticoagulation Episode Summary    Current INR goal:   2.0-3.0  TTR:   93.9 % (4.1 mo)  Next INR check:   04/27/2016  INR from last check:   3.0 (03/30/2016)  Weekly max dose:     Target end date:   Indefinite  INR check location[facility]   [facility]  Preferred lab:     Send INR reminders to:      Indications   History of pulmonary embolism [Z86.711]       Comments:   Per discharge summary for her acute PE, indicates 3 months warfarin, then switch to rivaroxaban for indeterminate length of therapy in setting of recurrence of VTE        ANTICOAG TODAY: Anticoagulation Summary  As of 03/30/2016   INR goal:   2.0-3.0  TTR:     Today's INR:   3.0  Next INR check:   04/27/2016  Target end date:   Indefinite   Indications   History of pulmonary embolism [Z86.711]        Anticoagulation Episode Summary    INR check location[facility]   [facility]   Preferred lab:      Send INR reminders to:      Comments:   Per discharge summary for her acute PE, indicates 3 months warfarin, then switch to rivaroxaban for indeterminate length of therapy in setting of recurrence of VTE      PATIENT INSTRUCTIONS: Patient Instructions  Patient instructed to take medications as defined in the Anti-coagulation Track section of this encounter.  Patient instructed to take today's dose.  Patient instructed to take 1/2 tablet (using her green 2.5mg  strength warfarin tablets) on Mondays,  Wednesdays, Fridays; Take 1 tablet all other days.  Patient verbalized understanding of these instructions.       FOLLOW-UP Return in about 4 weeks (around 04/27/2016) for Follow up INR at 0915h.  Hulen LusterJames Groce, III Pharm.D., CACP

## 2016-04-07 ENCOUNTER — Other Ambulatory Visit: Payer: Self-pay | Admitting: *Deleted

## 2016-04-07 MED ORDER — AMLODIPINE BESYLATE 10 MG PO TABS: 10.0000 mg | ORAL_TABLET | Freq: Every day | ORAL | 1 refills | Status: DC

## 2016-04-14 DIAGNOSIS — J9601 Acute respiratory failure with hypoxia: Secondary | ICD-10-CM | POA: Diagnosis not present

## 2016-04-23 ENCOUNTER — Ambulatory Visit (HOSPITAL_BASED_OUTPATIENT_CLINIC_OR_DEPARTMENT_OTHER)
Payer: Medicare Other | Attending: Student in an Organized Health Care Education/Training Program | Admitting: Internal Medicine

## 2016-04-23 VITALS — Ht 63.0 in | Wt 316.0 lb

## 2016-04-23 DIAGNOSIS — R0683 Snoring: Secondary | ICD-10-CM | POA: Insufficient documentation

## 2016-04-23 DIAGNOSIS — Z79899 Other long term (current) drug therapy: Secondary | ICD-10-CM | POA: Diagnosis not present

## 2016-04-23 DIAGNOSIS — G4733 Obstructive sleep apnea (adult) (pediatric): Secondary | ICD-10-CM | POA: Insufficient documentation

## 2016-04-23 DIAGNOSIS — G4730 Sleep apnea, unspecified: Secondary | ICD-10-CM | POA: Diagnosis present

## 2016-04-23 DIAGNOSIS — G4736 Sleep related hypoventilation in conditions classified elsewhere: Secondary | ICD-10-CM | POA: Insufficient documentation

## 2016-04-25 DIAGNOSIS — G4733 Obstructive sleep apnea (adult) (pediatric): Secondary | ICD-10-CM | POA: Diagnosis not present

## 2016-04-25 NOTE — Procedures (Signed)
Patient Name: Tina Chase, Tina Chase Gender: Female D.O.B: [birth year] Age (years): 75 Referring Provider: Tyson Aliasuncan Thomas Vincent Height (inches): 63 Interpreting Physician: Jetty Duhamellinton Jarrel Knoke MD, ABSM Weight (lbs): 316 RPSGT: Tina Chase, Tina Chase BMI: 56 MRN: [id number] Neck Size: 16.00 CLINICAL INFORMATION The patient is referred for a CPAP titration to treat sleep apnea.   Date of NPSG, Split Night or HST:  Diagnostic NPSG 02/11/16  AHI 124.5/ hr  Desaturation to 80%, body weight 316 lbs SLEEP STUDY TECHNIQUE As per the AASM Manual for the Scoring of Sleep and Associated Events v2.3 (April 2016) with a hypopnea requiring 4% desaturations. The channels recorded and monitored were frontal, central and occipital EEG, electrooculogram (EOG), submentalis EMG (chin), nasal and oral airflow, thoracic and abdominal wall motion, anterior tibialis EMG, snore microphone, electrocardiogram, and pulse oximetry. Continuous positive airway pressure (CPAP) was initiated at the beginning of the study and titrated to treat sleep-disordered breathing.  MEDICATIONS Medications taken by the patient : charted for review Medications administered by patient during sleep study :METOPROLOL  TECHNICIAN COMMENTS Comments added by technician: O2 initiated due to low sats. Patient tolerated CPAP well  Comments added by scorer: N/A  RESPIRATORY PARAMETERS Optimal PAP Pressure (cm): 20 AHI at Optimal Pressure (/hr): 1.4 Overall Minimal O2 (%): 69.00 Supine % at Optimal Pressure (%): 100 Minimal O2 at Optimal Pressure (%): 89.0    SLEEP ARCHITECTURE The study was initiated at 10:29:42 PM and ended at 4:35:47 AM. Sleep onset time was 10.7 minutes and the sleep efficiency was 76.4%. The total sleep time was 279.9 minutes. The patient spent 9.47% of the night in stage N1 sleep, 72.31% in stage N2 sleep, 1.97% in stage N3 and 16.26% in REM.Stage REM latency was 105.0 minutes Wake after sleep onset was  75.5 minutes.   Alpha intrusion was absent. Supine sleep was 100.00%.  CARDIAC DATA The 2 lead EKG demonstrated sinus rhythm. The mean heart rate was 64.71 beats per minute. Other EKG findings include: None.  LEG MOVEMENT DATA The total Periodic Limb Movements of Sleep (PLMS) were 0. The PLMS index was 0.00. A PLMS index of <15 is considered normal in adults.  IMPRESSIONS - The optimal PAP pressure was 20 cm of water. - Central sleep apnea was not noted during this titration (CAI = 0.0/h). - Severe oxygen desaturations were observed during this titration (min O2 = 69.00%). - Supplemental O2 2L min added at 04:33 AM because of sustained O2 desaturation < 88% for more than 5 minutes despite CPAP17 at that tiime. - The patient snored with Soft snoring volume during this titration study. - No cardiac abnormalities were observed during this study. - Clinically significant periodic limb movements were not noted during this study. Arousals associated with PLMs were rare.  DIAGNOSIS - Obstructive Sleep Apnea (327.23 [G47.33 ICD-10]) - Nocturnal Hypoxemia (327.26 [G47.36 ICD-10])  RECOMMENDATIONS - Trial of CPAP therapy on 20 cm H2O with a Medium size Resmed Full Face Mask AirFit F20 mask and heated humidification. - Supplemental O2 2L was required for persistent oxygen saturation - Avoid alcohol, sedatives and other CNS depressants that may worsen sleep apnea and disrupt normal sleep architecture. - Sleep hygiene should be reviewed to assess factors that may improve sleep quality. - Weight management and regular exercise should be initiated or continued.  [Electronically signed] 04/25/2016 03:17 PM  Jetty Duhamellinton Kyian Obst MD, ABSM Diplomate, American Board of Sleep Medicine   NPI: [id number]  Waymon BudgeYOUNG,Laurren Lepkowski D Diplomate, American Board of Sleep Medicine  ELECTRONICALLY SIGNED ON:  04/25/2016, 3:10 PM [facility] PH: [phone]   FX: [id number] ACCREDITED BY THE  AMERICAN ACADEMY OF SLEEP MEDICINE

## 2016-04-27 ENCOUNTER — Ambulatory Visit (INDEPENDENT_AMBULATORY_CARE_PROVIDER_SITE_OTHER): Payer: Medicare Other | Admitting: Pharmacist

## 2016-04-27 DIAGNOSIS — Z86711 Personal history of pulmonary embolism: Secondary | ICD-10-CM

## 2016-04-27 DIAGNOSIS — Z7901 Long term (current) use of anticoagulants: Secondary | ICD-10-CM | POA: Diagnosis not present

## 2016-04-27 LAB — POCT INR: INR: 3

## 2016-04-27 NOTE — Progress Notes (Signed)
Anti-Coagulation Progress Note  Tina Chase is a 75 y.o. female who is currently on an anti-coagulation regimen.    RECENT RESULTS: Recent results are below, the most recent result is correlated with a dose of 41.25 mg. per week: Lab Results  Component Value Date   INR 3.00 04/27/2016   INR 3.0 03/30/2016   INR 2.6 03/02/2016    ANTI-COAG DOSE: Anticoagulation Dose Instructions as of 04/27/2016      Glynis SmilesSun Mon Tue Wed Thu Fri Sat   New Dose 7.5 mg 3.75 mg 7.5 mg 3.75 mg 7.5 mg 3.75 mg 7.5 mg       ANTICOAG SUMMARY: Anticoagulation Episode Summary    Current INR goal:   2.0-3.0  TTR:   95.1 % (5 mo)  Next INR check:   05/18/2016  INR from last check:   3.00 (04/27/2016)  Weekly max dose:     Target end date:   Indefinite  [facility]  Preferred lab:     Send INR reminders to:      Indications   History of pulmonary embolism [Z86.711]       Comments:   Per discharge summary for her acute PE, indicates 3 months warfarin, then switch to rivaroxaban for indeterminate length of therapy in setting of recurrence of VTE        ANTICOAG TODAY: Anticoagulation Summary  As of 04/27/2016   INR goal:   2.0-3.0  TTR:     Today's INR:   3.00  Next INR check:   05/18/2016  Target end date:   Indefinite   Indications   History of pulmonary embolism [Z86.711]        Anticoagulation Episode Summary    [facility]   [facility]   Preferred lab:      Send INR reminders to:      Comments:   Per discharge summary for her acute PE, indicates 3 months warfarin, then switch to rivaroxaban for indeterminate length of therapy in setting of recurrence of VTE      PATIENT INSTRUCTIONS: There are no Patient Instructions on file for this visit.   FOLLOW-UP Return in 3 weeks (on 05/18/2016) for Follow up INR at 0900h.  Hulen LusterJames Hiawatha Dressel, III Pharm.D., CACP

## 2016-04-27 NOTE — Patient Instructions (Signed)
Patient instructed to take medications as defined in the Anti-coagulation Track section of this encounter.  Patient instructed to take today's dose.  Patient verbalized understanding of these instructions.    

## 2016-05-11 NOTE — Addendum Note (Signed)
Addended by: Erlinda HongVINCENT, Worth Kober T on: 05/11/2016 08:37 AM   Modules accepted: Orders

## 2016-05-14 DIAGNOSIS — J9601 Acute respiratory failure with hypoxia: Secondary | ICD-10-CM | POA: Diagnosis not present

## 2016-05-15 ENCOUNTER — Other Ambulatory Visit: Payer: Self-pay | Admitting: *Deleted

## 2016-05-15 DIAGNOSIS — I5033 Acute on chronic diastolic (congestive) heart failure: Secondary | ICD-10-CM

## 2016-05-18 ENCOUNTER — Ambulatory Visit (INDEPENDENT_AMBULATORY_CARE_PROVIDER_SITE_OTHER): Payer: Medicare Other | Admitting: Pharmacist

## 2016-05-18 ENCOUNTER — Other Ambulatory Visit: Payer: Self-pay | Admitting: Internal Medicine

## 2016-05-18 DIAGNOSIS — Z7901 Long term (current) use of anticoagulants: Secondary | ICD-10-CM | POA: Diagnosis not present

## 2016-05-18 DIAGNOSIS — Z86711 Personal history of pulmonary embolism: Secondary | ICD-10-CM | POA: Diagnosis not present

## 2016-05-18 LAB — POCT INR: INR: 4.4

## 2016-05-18 MED ORDER — FUROSEMIDE 20 MG PO TABS: 40.0000 mg | ORAL_TABLET | Freq: Every day | ORAL | 1 refills | Status: DC

## 2016-05-18 MED ORDER — WARFARIN SODIUM 7.5 MG PO TABS: ORAL_TABLET | ORAL | 2 refills | Status: DC

## 2016-05-18 NOTE — Patient Instructions (Signed)
Patient instructed to take medications as defined in the Anti-coagulation Track section of this encounter.  Patient instructed to OMIT today's dose.  Patient verbalized understanding of these instructions.    

## 2016-05-18 NOTE — Progress Notes (Signed)
Anti-Coagulation Progress Note  Tina BlendRebecca J Chase is a 75 y.o. female who is currently on an anti-coagulation regimen.    RECENT RESULTS: Recent results are below, the most recent result is correlated with a dose of 41.25 mg. per week: Lab Results  Component Value Date   INR 4.4 05/18/2016   INR 3.00 04/27/2016   INR 3.0 03/30/2016    ANTI-COAG DOSE: Anticoagulation Dose Instructions as of 05/18/2016      Glynis SmilesSun Mon Tue Wed Thu Fri Sat   New Dose 7.5 mg 3.75 mg 3.75 mg 3.75 mg 7.5 mg 3.75 mg 7.5 mg    Description   OMIT today's dose--just this one time. Recommence warfarin tomorrow as shown on your instructions.       ANTICOAG SUMMARY: Anticoagulation Episode Summary    Current INR goal:   2.0-3.0  TTR:   83.5 % (5.7 mo)  Next INR check:   06/08/2016  INR from last check:   4.4! (05/18/2016)  Weekly max dose:     Target end date:   Indefinite  [facility]  Preferred lab:     Send INR reminders to:      Indications   History of pulmonary embolism [Z86.711]       Comments:   Per discharge summary for her acute PE, indicates 3 months warfarin, then switch to rivaroxaban for indeterminate length of therapy in setting of recurrence of VTE        ANTICOAG TODAY: Anticoagulation Summary  As of 05/18/2016   INR goal:   2.0-3.0  TTR:     Today's INR:   4.4!  Next INR check:   06/08/2016  Target end date:   Indefinite   Indications   History of pulmonary embolism [Z86.711]        Anticoagulation Episode Summary    [facility]   [facility]   Preferred lab:      Send INR reminders to:      Comments:   Per discharge summary for her acute PE, indicates 3 months warfarin, then switch to rivaroxaban for indeterminate length of therapy in setting of recurrence of VTE      PATIENT INSTRUCTIONS: There are no Patient Instructions on file for this visit.   FOLLOW-UP Return in about 3 weeks (around 06/08/2016) for Follow up INR at  0915h.  Hulen LusterJames Phu Record, III Pharm.D., CACP

## 2016-05-19 DIAGNOSIS — G4733 Obstructive sleep apnea (adult) (pediatric): Secondary | ICD-10-CM | POA: Diagnosis not present

## 2016-05-19 DIAGNOSIS — T8189XA Other complications of procedures, not elsewhere classified, initial encounter: Secondary | ICD-10-CM | POA: Diagnosis not present

## 2016-05-19 DIAGNOSIS — S301XXA Contusion of abdominal wall, initial encounter: Secondary | ICD-10-CM | POA: Diagnosis not present

## 2016-05-19 DIAGNOSIS — J9610 Chronic respiratory failure, unspecified whether with hypoxia or hypercapnia: Secondary | ICD-10-CM | POA: Diagnosis not present

## 2016-05-19 DIAGNOSIS — J9601 Acute respiratory failure with hypoxia: Secondary | ICD-10-CM | POA: Diagnosis not present

## 2016-05-27 DIAGNOSIS — K432 Incisional hernia without obstruction or gangrene: Secondary | ICD-10-CM | POA: Diagnosis not present

## 2016-05-31 NOTE — Progress Notes (Signed)
Indication: Recurrent venous thromboembolism.  Duration: Indefinite.  INR above target.  Agree with Dr. Saralyn PilarGroce's assessment and plan as documented.

## 2016-06-08 ENCOUNTER — Ambulatory Visit (INDEPENDENT_AMBULATORY_CARE_PROVIDER_SITE_OTHER): Payer: Medicare Other | Admitting: Pharmacist

## 2016-06-08 DIAGNOSIS — Z86711 Personal history of pulmonary embolism: Secondary | ICD-10-CM

## 2016-06-08 DIAGNOSIS — Z7901 Long term (current) use of anticoagulants: Secondary | ICD-10-CM

## 2016-06-08 LAB — POCT INR: INR: 2.5

## 2016-06-08 NOTE — Progress Notes (Signed)
INTERNAL MEDICINE TEACHING ATTENDING ADDENDUM - Joliene Salvador M.D  Duration- indefinite, Indication- PE, INR- therapeutic. Agree with pharmacy recommendations as outlined in their note.     

## 2016-06-08 NOTE — Progress Notes (Signed)
Anti-Coagulation Progress Note  [name] Laforte is a 75 y.o. female who is currently on an anti-coagulation regimen.    RECENT RESULTS: Recent results are below, the most recent result is correlated with a dose of 37.5 mg. per week: Lab Results  Component Value Date   INR 2.50 06/08/2016   INR 4.4 05/18/2016   INR 3.00 04/27/2016    ANTI-COAG DOSE: Anticoagulation Dose Instructions as of 06/08/2016      Glynis SmilesSun Mon Tue Wed Thu Fri Sat   New Dose 7.5 mg 3.75 mg 3.75 mg 3.75 mg 7.5 mg 3.75 mg 7.5 mg    Description   Take 1/2 tablet of your warfarin 7.5mg  strength tablet by mouth on Mondays, Tuesdays, Wednesdays and Fridays; on Thursdays and Saturdays--take 1 tablet.       ANTICOAG SUMMARY: Anticoagulation Episode Summary    Current INR goal:   2.0-3.0  TTR:   77.3 % (6.4 mo)  Next INR check:   07/13/2016  INR from last check:   2.50 (06/08/2016)  Weekly max dose:     Target end date:   Indefinite  [facility]  Preferred lab:     Send INR reminders to:      Indications   History of pulmonary embolism [Z86.711]       Comments:   Per discharge summary for her acute PE, indicates 3 months warfarin, then switch to rivaroxaban for indeterminate length of therapy in setting of recurrence of VTE        ANTICOAG TODAY: Anticoagulation Summary  As of 06/08/2016   INR goal:   2.0-3.0  TTR:     Today's INR:   2.50  Next INR check:   07/13/2016  Target end date:   Indefinite   Indications   History of pulmonary embolism [Z86.711]        Anticoagulation Episode Summary    [facility]   [facility]   Preferred lab:      Send INR reminders to:      Comments:   Per discharge summary for her acute PE, indicates 3 months warfarin, then switch to rivaroxaban for indeterminate length of therapy in setting of recurrence of VTE      PATIENT INSTRUCTIONS: Patient instructed to take medications as defined in the Anti-coagulation Track section of  this encounter.  Patient instructed to take  today's dose.  Patient instructed to take 1/2 tablet of your 7.5mg  strength tablets on Mondays, Tuesdays, Wednesdays, and Fridays; on Thursdays, Saturdays and Sundays--take 1 tablet.  Patient verbalized understanding of these instructions.     FOLLOW-UP Return in 5 weeks (on 07/13/2016) for Follow up INR at 0930h.  Hulen LusterJames Vaudie Engebretsen, III Pharm.D., CACP

## 2016-06-08 NOTE — Patient Instructions (Signed)
Patient instructed to take medications as defined in the Anti-coagulation Track section of this encounter.  Patient instructed to take  today's dose.  Patient instructed to take 1/2 tablet of your 7.5mg  strength tablets on Mondays, Tuesdays, Wednesdays, and Fridays; on Thursdays, Saturdays and Sundays--take 1 tablet.  Patient verbalized understanding of these instructions.

## 2016-06-14 DIAGNOSIS — J9601 Acute respiratory failure with hypoxia: Secondary | ICD-10-CM | POA: Diagnosis not present

## 2016-06-19 DIAGNOSIS — G4733 Obstructive sleep apnea (adult) (pediatric): Secondary | ICD-10-CM | POA: Diagnosis not present

## 2016-06-19 DIAGNOSIS — T8189XA Other complications of procedures, not elsewhere classified, initial encounter: Secondary | ICD-10-CM | POA: Diagnosis not present

## 2016-06-22 ENCOUNTER — Telehealth: Payer: Self-pay | Admitting: Pharmacist

## 2016-06-22 NOTE — Telephone Encounter (Signed)
Called patient to re-schedule an INR that had been made for 4-DEC-17. [facility] I explained that neither myself or Dr. Selena BattenKim would be here--as [facility] She indicated she would let her physician obtain the INR and adjust the dose as necessary.

## 2016-07-06 ENCOUNTER — Ambulatory Visit: Payer: Medicare Other

## 2016-07-06 ENCOUNTER — Ambulatory Visit (INDEPENDENT_AMBULATORY_CARE_PROVIDER_SITE_OTHER): Payer: Medicare Other | Admitting: Student in an Organized Health Care Education/Training Program

## 2016-07-06 VITALS — BP 130/80 | HR 69 | Temp 98.2°F | Ht 64.0 in | Wt 318.9 lb

## 2016-07-06 DIAGNOSIS — Z86711 Personal history of pulmonary embolism: Secondary | ICD-10-CM

## 2016-07-06 DIAGNOSIS — G4733 Obstructive sleep apnea (adult) (pediatric): Secondary | ICD-10-CM

## 2016-07-06 DIAGNOSIS — Z7901 Long term (current) use of anticoagulants: Secondary | ICD-10-CM | POA: Diagnosis not present

## 2016-07-06 DIAGNOSIS — Z6841 Body Mass Index (BMI) 40.0 and over, adult: Secondary | ICD-10-CM

## 2016-07-06 DIAGNOSIS — R600 Localized edema: Secondary | ICD-10-CM | POA: Diagnosis not present

## 2016-07-06 DIAGNOSIS — J4520 Mild intermittent asthma, uncomplicated: Secondary | ICD-10-CM

## 2016-07-06 DIAGNOSIS — Z87891 Personal history of nicotine dependence: Secondary | ICD-10-CM

## 2016-07-06 DIAGNOSIS — Z9989 Dependence on other enabling machines and devices: Secondary | ICD-10-CM

## 2016-07-06 DIAGNOSIS — I10 Essential (primary) hypertension: Secondary | ICD-10-CM

## 2016-07-06 DIAGNOSIS — R7309 Other abnormal glucose: Secondary | ICD-10-CM | POA: Diagnosis not present

## 2016-07-06 LAB — POCT INR: INR: 2.4

## 2016-07-06 NOTE — Progress Notes (Signed)
Assessment and Plan:  See Encounters tab for problem-based medical decision making.   __________________________________________________________  HPI:  75 year old woman comes in today for follow-up of sleep apnea. In September she was able to complete the second part of the sleep study which titrated her CPAP settings. She went to a class on CPAP and has started using it at home for the last 1 month. She is reporting that she feels much less fatigued during the day. Reports good quality of sleep at night. Overall she is very happy with starting the CPAP machine. She does say that she is having some trouble with the fit of her mask. She feels like it's leaking around her face, and sometimes falls off during the night. She reports good compliance with her medications otherwise. No recent illnesses. No fevers or chills. No chest pain or dyspnea on exertion. Her weight is stable at 318 pounds with a BMI over 55. She reports good compliance with Coumadin and watches her diet carefully. She follows up with Dr. Alexandria LodgeGroce in our [facility]madin clinic once monthly and has had largely well-controlled INRs. 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  __________________________________________________________  Problem List: Patient Active Problem List   Diagnosis Date Noted  . History of pulmonary embolism     Priority: High  . Sleep apnea 07/08/2015    Priority: High  . Bilateral lower extremity edema 01/09/2014    Priority: Medium  . Hyperuricemia 05/25/2013    Priority: Medium  . Nephrolithiasis 05/25/2013    Priority: Medium  . Hypertension 12/22/2011    Priority: Medium  . Obesity, Class III, BMI 40-49.9 (morbid obesity) (HCC) 12/22/2011    Priority: Medium  . Asthma 12/04/2011    Priority: Low  . 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 Routine health maintenance 09/15/2011    Priority: Low    Medications: Reconciled today in  Epic __________________________________________________________  Physical Exam:  Vital Signs: Vitals:   07/06/16 0844 07/06/16 0950  BP: (!) 145/63 130/80  Pulse: 64   Weight: (!) 318 lb 14.4 oz (144.7 kg)     Gen: Well appearing, NAD CV: RRR, 2/6 early systolic murmur at RUSB Pulm: Normal effort, CTA throughout, no wheezing Ext: Warm, Large nonpitting edema bilaterally, normal joints Skin: No atypical appearing moles. No rashes

## 2016-07-06 NOTE — Assessment & Plan Note (Signed)
Unprovoked large pulmonary embolism in April 2017, doing well on anticoagulation with Coumadin since that time. I felt she was not a good candidate for a DOAC because of severe obesity. She follows with our Coumadin clinic. She denies bleeding side effects. Plan to continue Coumadin indefinitely. Check INR today.

## 2016-07-06 NOTE — Assessment & Plan Note (Signed)
Patient completed 2 sleep studies showing severe objective sleep apnea. Had CPAP titration in September. She started CPAP at home about one month ago and is reporting good benefit. Feeling much less fatigued during the day. Having some trouble with fit of her mask at home. I advised her to call the medical equipment company to troubleshoot the fit of the mask.

## 2016-07-06 NOTE — Patient Instructions (Signed)
1. Use the flovent inhaler every day, two puffs in the morning. Rinse your mouth with water after. Use the albuterol inhaler only when you are wheezing, should not be every day.   2. Call the CPAP company about you mask fitting improperly. Keep using CPAP nightly, it is encouraging that it makes you feel better.

## 2016-07-06 NOTE — Assessment & Plan Note (Signed)
Mild intermittent asthma is doing well at home. She was using the inhaled corticosteroid as needed, counseled her to use fluticasone inhaler every day for long-term asthma control. Also counseled her on rinsing her mouth after use to prevent thrush. She will continue albuterol inhaler as needed for wheezing.

## 2016-07-06 NOTE — Assessment & Plan Note (Signed)
Blood pressure initially elevated in the office, on recheck with a and a properly sized cuff her blood pressure is 130/80. She reports good compliance with medications without symptoms of lightheadedness. Plan to continue amlodipine 10 mg daily. Plan to check basic metabolic panel today. Given obesity and hypertension, we'll also check an A1c to rule out glucose intolerance.

## 2016-07-07 ENCOUNTER — Other Ambulatory Visit: Payer: Self-pay | Admitting: Student in an Organized Health Care Education/Training Program

## 2016-07-07 ENCOUNTER — Encounter: Payer: Self-pay | Admitting: Student in an Organized Health Care Education/Training Program

## 2016-07-07 DIAGNOSIS — Z1231 Encounter for screening mammogram for malignant neoplasm of breast: Secondary | ICD-10-CM

## 2016-07-07 LAB — BMP8+ANION GAP
ANION GAP: 15 mmol/L (ref 10.0–18.0)
BUN/Creatinine Ratio: 15 (ref 12–28)
BUN: 11 mg/dL (ref 8–27)
CALCIUM: 9.8 mg/dL (ref 8.7–10.3)
CHLORIDE: 99 mmol/L (ref 96–106)
CO2: 27 mmol/L (ref 18–29)
Creatinine, Ser: 0.71 mg/dL (ref 0.57–1.00)
GFR calc Af Amer: 98 mL/min/{1.73_m2} (ref >59)
GFR, EST NON AFRICAN AMERICAN: 85 mL/min/{1.73_m2} (ref >59)
Glucose: 107 mg/dL — ABNORMAL HIGH (ref 65–99)
POTASSIUM: 3.9 mmol/L (ref 3.5–5.2)
Sodium: 141 mmol/L (ref 134–144)

## 2016-07-07 LAB — HEMOGLOBIN A1C
ESTIMATED AVERAGE GLUCOSE: 108 mg/dL
Hgb A1c MFr Bld: 5.4 % (ref 4.8–5.6)

## 2016-07-14 DIAGNOSIS — J9601 Acute respiratory failure with hypoxia: Secondary | ICD-10-CM | POA: Diagnosis not present

## 2016-07-19 DIAGNOSIS — T8189XA Other complications of procedures, not elsewhere classified, initial encounter: Secondary | ICD-10-CM | POA: Diagnosis not present

## 2016-07-19 DIAGNOSIS — G4733 Obstructive sleep apnea (adult) (pediatric): Secondary | ICD-10-CM | POA: Diagnosis not present

## 2016-07-20 ENCOUNTER — Ambulatory Visit (INDEPENDENT_AMBULATORY_CARE_PROVIDER_SITE_OTHER): Payer: Medicare Other | Admitting: Pharmacist

## 2016-07-20 ENCOUNTER — Encounter (INDEPENDENT_AMBULATORY_CARE_PROVIDER_SITE_OTHER): Payer: Self-pay

## 2016-07-20 ENCOUNTER — Other Ambulatory Visit: Payer: Self-pay | Admitting: Internal Medicine

## 2016-07-20 DIAGNOSIS — Z7901 Long term (current) use of anticoagulants: Secondary | ICD-10-CM

## 2016-07-20 DIAGNOSIS — Z86711 Personal history of pulmonary embolism: Secondary | ICD-10-CM

## 2016-07-20 LAB — POCT INR: INR: 2.5

## 2016-07-20 MED ORDER — WARFARIN SODIUM 7.5 MG PO TABS: ORAL_TABLET | ORAL | 2 refills | Status: DC

## 2016-07-20 NOTE — Progress Notes (Signed)
Anti-Coagulation Progress Note  [name] is a 75 y.o. female who is currently on an anti-coagulation regimen.    RECENT RESULTS: Recent results are below, the most recent result is correlated with a dose of 37.5 mg. per week: Lab Results  Component Value Date   INR 2.50 07/20/2016   INR 2.4 07/06/2016   INR 2.50 06/08/2016    ANTI-COAG DOSE: Anticoagulation Dose Instructions as of 07/20/2016      Glynis SmilesSun Mon Tue Wed Thu Fri Sat   New Dose 7.5 mg 3.75 mg 3.75 mg 3.75 mg 7.5 mg 3.75 mg 7.5 mg    Description   Take 1/2 tablet of your warfarin 7.5mg  strength tablet by mouth on Mondays, Tuesdays, Wednesdays and Fridays; on Thursdays, Saturdays and Sundays-take 1 tablet.       ANTICOAG SUMMARY: Anticoagulation Episode Summary    Current INR goal:   2.0-3.0  TTR:   81.3 % (7.8 mo)  Next INR check:   08/24/2016  INR from last check:   2.50 (07/20/2016)  Weekly max dose:     Target end date:   Indefinite  [facility]  Preferred lab:     Send INR reminders to:      Indications   History of pulmonary embolism [Z86.711]       Comments:   Per discharge summary for her acute PE, indicates 3 months warfarin, then switch to rivaroxaban for indeterminate length of therapy in setting of recurrence of VTE        ANTICOAG TODAY: Anticoagulation Summary  As of 07/20/2016   INR goal:   2.0-3.0  TTR:     Today's INR:   2.50  Next INR check:   08/24/2016  Target end date:   Indefinite   Indications   History of pulmonary embolism [Z86.711]        Anticoagulation Episode Summary    [facility] check [facility]   [facility]   Preferred lab:      Send INR reminders to:      Comments:   Per discharge summary for her acute PE, indicates 3 months warfarin, then switch to rivaroxaban for indeterminate length of therapy in setting of recurrence of VTE      PATIENT INSTRUCTIONS: Patient instructed to take medications as defined in the Anti-coagulation Track  section of this encounter.  Patient instructed to take today's dose.  Patient instructed to take 1/2 tablet of your 7.5mg  warfarin tablet on Mondays, Tuesdays, Wednesdays and Fridays; on all other days--take 1 tablet.  Patient verbalized understanding of these instructions.      FOLLOW-UP Return in 5 weeks (on 08/24/2016) for Follow up INR at 0915h.  Hulen LusterJames Groce, III Pharm.D., CACP

## 2016-07-20 NOTE — Patient Instructions (Signed)
Patient instructed to take medications as defined in the Anti-coagulation Track section of this encounter.  Patient instructed to take today's dose.  Patient instructed to take 1/2 tablet of your 7.5mg  warfarin tablet on Mondays, Tuesdays, Wednesdays and Fridays; on all other days--take 1 tablet.  Patient verbalized understanding of these instructions.

## 2016-07-20 NOTE — Progress Notes (Signed)
INTERNAL MEDICINE TEACHING ATTENDING ADDENDUM - Melondy Blanchard M.D  Duration- indefinite, Indication- PE, INR- therapeutic. Agree with pharmacy recommendations as outlined in their note.     

## 2016-07-20 NOTE — Addendum Note (Signed)
Addended by: Hulen LusterGROCE, JAMES B on: 07/20/2016 09:35 AM   Modules accepted: Orders

## 2016-07-28 ENCOUNTER — Ambulatory Visit
Admission: RE | Admit: 2016-07-28 | Discharge: 2016-07-28 | Disposition: A | Discharge status: Home / Self Care | Payer: Medicare Other | Source: Ambulatory Visit | Attending: Student in an Organized Health Care Education/Training Program | Admitting: Student in an Organized Health Care Education/Training Program

## 2016-07-28 DIAGNOSIS — Z1231 Encounter for screening mammogram for malignant neoplasm of breast: Secondary | ICD-10-CM

## 2016-08-11 DIAGNOSIS — G4733 Obstructive sleep apnea (adult) (pediatric): Secondary | ICD-10-CM | POA: Diagnosis not present

## 2016-08-11 DIAGNOSIS — S301XXA Contusion of abdominal wall, initial encounter: Secondary | ICD-10-CM | POA: Diagnosis not present

## 2016-08-11 DIAGNOSIS — T8189XA Other complications of procedures, not elsewhere classified, initial encounter: Secondary | ICD-10-CM | POA: Diagnosis not present

## 2016-08-11 DIAGNOSIS — J9601 Acute respiratory failure with hypoxia: Secondary | ICD-10-CM | POA: Diagnosis not present

## 2016-08-11 DIAGNOSIS — J9610 Chronic respiratory failure, unspecified whether with hypoxia or hypercapnia: Secondary | ICD-10-CM | POA: Diagnosis not present

## 2016-08-14 DIAGNOSIS — J9601 Acute respiratory failure with hypoxia: Secondary | ICD-10-CM | POA: Diagnosis not present

## 2016-08-19 DIAGNOSIS — G4733 Obstructive sleep apnea (adult) (pediatric): Secondary | ICD-10-CM | POA: Diagnosis not present

## 2016-08-19 DIAGNOSIS — J9610 Chronic respiratory failure, unspecified whether with hypoxia or hypercapnia: Secondary | ICD-10-CM | POA: Diagnosis not present

## 2016-08-19 DIAGNOSIS — T8189XA Other complications of procedures, not elsewhere classified, initial encounter: Secondary | ICD-10-CM | POA: Diagnosis not present

## 2016-08-24 ENCOUNTER — Ambulatory Visit (INDEPENDENT_AMBULATORY_CARE_PROVIDER_SITE_OTHER): Payer: Medicare Other | Admitting: Pharmacist

## 2016-08-24 DIAGNOSIS — Z7901 Long term (current) use of anticoagulants: Secondary | ICD-10-CM

## 2016-08-24 DIAGNOSIS — Z86711 Personal history of pulmonary embolism: Secondary | ICD-10-CM

## 2016-08-24 LAB — POCT INR: INR: 2.5

## 2016-08-24 NOTE — Progress Notes (Signed)
Anti-Coagulation Progress Note  Tina BlendRebecca J Harrigan is a 76 y.o. female who is currently on an anti-coagulation regimen.    RECENT RESULTS: Recent results are below, the most recent result is correlated with a dose of 37.5 mg. per week: Lab Results  Component Value Date   INR 2.50 08/24/2016   INR 2.50 07/20/2016   INR 2.4 07/06/2016    ANTI-COAG DOSE: Anticoagulation Dose Instructions as of 08/24/2016      Glynis SmilesSun Mon Tue Wed Thu Fri Sat   New Dose 7.5 mg 3.75 mg 3.75 mg 3.75 mg 7.5 mg 3.75 mg 7.5 mg    Description   Take 1/2 tablet of your warfarin 7.5mg  strength tablet by mouth on Mondays, Tuesdays, Wednesdays and Fridays; on Thursdays, Saturdays and Sundays-take 1 tablet.       ANTICOAG SUMMARY: Anticoagulation Episode Summary    Current INR goal:   2.0-3.0  TTR:   83.7 % (9 mo)  Next INR check:   09/21/2016  INR from last check:   2.50 (08/24/2016)  Weekly max dose:     Target end date:   Indefinite  [facility] check [facility]   [facility]  Preferred lab:     Send INR reminders to:      Indications   History of pulmonary embolism [Z86.711]       Comments:   Per discharge summary for her acute PE, indicates 3 months warfarin, then switch to rivaroxaban for indeterminate length of therapy in setting of recurrence of VTE        ANTICOAG TODAY: Anticoagulation Summary  As of 08/24/2016   INR goal:   2.0-3.0  TTR:     Today's INR:   2.50  Next INR check:   09/21/2016  Target end date:   Indefinite   Indications   History of pulmonary embolism [Z86.711]        Anticoagulation Episode Summary    [facility]   [facility]   Preferred lab:      Send INR reminders to:      Comments:   Per discharge summary for her acute PE, indicates 3 months warfarin, then switch to rivaroxaban for indeterminate length of therapy in setting of recurrence of VTE      PATIENT INSTRUCTIONS: Take 1/2 tablet of your warfarin 7.5mg  strength tablet by mouth on Mondays, Tuesdays,  Wednesdays and Fridays; on Thursdays, Saturdays and Sundays-take 1 tablet.    FOLLOW-UP Return in 4 weeks (on 09/21/2016) for Follow up INR at 0915.  Hulen LusterJames Jilian West, III Pharm.D., CACP

## 2016-08-24 NOTE — Progress Notes (Signed)
INTERNAL MEDICINE TEACHING ATTENDING ADDENDUM - Earl LagosNischal Averie Meiner M.D  Duration- indefinte, Indication- PE, INR- therapeutic. Agree with pharmacy recommendations as outlined in their note.

## 2016-08-24 NOTE — Patient Instructions (Signed)
Patient instructed to take medications as defined in the Anti-coagulation Track section of this encounter.  Patient instructed to take today's dose.  Patient instructed to take 1/2 tablet of your warfarin 7.5mg  strength tablet by mouth on Mondays, Tuesdays, Wednesdays and Fridays; on Thursdays, Saturdays and Sundays-take 1 tablet.  Patient verbalized understanding of these instructions.

## 2016-09-11 ENCOUNTER — Other Ambulatory Visit: Payer: Self-pay | Admitting: Student in an Organized Health Care Education/Training Program

## 2016-09-14 DIAGNOSIS — J9601 Acute respiratory failure with hypoxia: Secondary | ICD-10-CM | POA: Diagnosis not present

## 2016-09-17 ENCOUNTER — Encounter: Payer: Self-pay | Admitting: Podiatry

## 2016-09-17 ENCOUNTER — Ambulatory Visit (INDEPENDENT_AMBULATORY_CARE_PROVIDER_SITE_OTHER): Payer: Medicare Other

## 2016-09-17 ENCOUNTER — Ambulatory Visit (INDEPENDENT_AMBULATORY_CARE_PROVIDER_SITE_OTHER): Payer: Medicare Other | Admitting: Podiatry

## 2016-09-17 VITALS — Resp 16 | Ht 64.0 in | Wt 307.0 lb

## 2016-09-17 DIAGNOSIS — M2141 Flat foot [pes planus] (acquired), right foot: Secondary | ICD-10-CM

## 2016-09-17 DIAGNOSIS — M722 Plantar fascial fibromatosis: Secondary | ICD-10-CM

## 2016-09-17 DIAGNOSIS — M2142 Flat foot [pes planus] (acquired), left foot: Secondary | ICD-10-CM

## 2016-09-17 MED ORDER — TRIAMCINOLONE ACETONIDE 10 MG/ML IJ SUSP
10.0000 mg | Freq: Once | INTRAMUSCULAR | Status: AC
  Administered 2016-09-17: 10 mg

## 2016-09-17 NOTE — Progress Notes (Signed)
   Subjective:    Patient ID: Tina BlendRebecca J Ledgerwood, female    DOB: 1941-03-24, 76 y.o.   MRN: [id number]  HPI  Chief Complaint  Patient presents with  . Flat Foot    BL  . Foot Pain    Left; Arch, Bottom of heel and top of foot x "over a year".        Review of Systems     Objective:   Physical Exam        Assessment & Plan:

## 2016-09-17 NOTE — Progress Notes (Signed)
Subjective:     Patient ID: Tina BlendRebecca J Decaire, female   DOB: 10/18/1940, 76 y.o.   MRN: [id number]  HPI patient presents with severe flatfoot deformity bilateral edema in the lower legs with patient found to be obese and states that the left arch has been really hurting her   Review of Systems  All other systems reviewed and are negative.      Objective:   Physical Exam  Constitutional: She is oriented to person, place, and time.  Cardiovascular: Intact distal pulses.   Musculoskeletal: Normal range of motion.  Neurological: She is oriented to person, place, and time.  Skin: Skin is warm and dry.  Nursing note and vitals reviewed.  neurovascular status was found to be intact with significant depression of the arch bilateral and exquisite discomfort in the left arch. 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

## 2016-09-19 DIAGNOSIS — T8189XA Other complications of procedures, not elsewhere classified, initial encounter: Secondary | ICD-10-CM | POA: Diagnosis not present

## 2016-09-19 DIAGNOSIS — G4733 Obstructive sleep apnea (adult) (pediatric): Secondary | ICD-10-CM | POA: Diagnosis not present

## 2016-09-21 ENCOUNTER — Ambulatory Visit (INDEPENDENT_AMBULATORY_CARE_PROVIDER_SITE_OTHER): Payer: Medicare Other | Admitting: Pharmacist

## 2016-09-21 DIAGNOSIS — Z86711 Personal history of pulmonary embolism: Secondary | ICD-10-CM

## 2016-09-21 DIAGNOSIS — Z7901 Long term (current) use of anticoagulants: Secondary | ICD-10-CM | POA: Diagnosis not present

## 2016-09-21 LAB — POCT INR: INR: 2.1

## 2016-09-21 NOTE — Progress Notes (Signed)
Anti-Coagulation Progress Note  Tina Chase is a 76 y.o. female who is currently on an anti-coagulation regimen.    RECENT RESULTS: Recent results are below, the most recent result is correlated with a dose of 37.4 mg. per week: Lab Results  Component Value Date   INR 2.10 09/21/2016   INR 2.50 08/24/2016   INR 2.50 07/20/2016    ANTI-COAG DOSE: Anticoagulation Dose Instructions as of 09/21/2016      Glynis SmilesSun Mon Tue Wed Thu Fri Sat   New Dose 7.5 mg 3.75 mg 3.75 mg 3.75 mg 7.5 mg 3.75 mg 7.5 mg    Description   Take 1/2 tablet of your warfarin 7.5mg  strength tablet by mouth on Mondays, Wednesdays and Fridays; all other days, take 1 tablet.        ANTICOAG SUMMARY: Anticoagulation Episode Summary    Current INR goal:   2.0-3.0  TTR:   85.3 % (9.9 mo)  Next INR check:   10/19/2016  INR from last check:   2.10 (09/21/2016)  Weekly max dose:     Target end date:   Indefinite  [facility] check location:   [facility]  Preferred lab:     Send INR reminders to:      Indications   History of pulmonary embolism [Z86.711]       Comments:   Per discharge summary for her acute PE, indicates 3 months warfarin, then switch to rivaroxaban for indeterminate length of therapy in setting of recurrence of VTE        ANTICOAG TODAY: Anticoagulation Summary  As of 09/21/2016   INR goal:   2.0-3.0  TTR:     Today's INR:   2.10  Next INR check:   10/19/2016  Target end date:   Indefinite   Indications   History of pulmonary embolism [Z86.711]        Anticoagulation Episode Summary    INR check location[facility]   [facility]   Preferred lab:      Send INR reminders to:      Comments:   Per discharge summary for her acute PE, indicates 3 months warfarin, then switch to rivaroxaban for indeterminate length of therapy in setting of recurrence of VTE      PATIENT INSTRUCTIONS: 1/2 tablet of your warfarin 7.5mg  strength tablet by mouth on Mondays, Wednesdays and Fridays; all other days, take  1 tablet.       FOLLOW-UP Return in 4 weeks (on 10/19/2016) for Follow up INR at 1000h.  Hulen LusterJames Chief Walkup, III Pharm.D., CACP

## 2016-09-21 NOTE — Patient Instructions (Signed)
Patient instructed to take medications as defined in the Anti-coagulation Track section of this encounter.  Patient instructed to take today's dose.  Patient instructed to take 1/2 tablet of your warfarin 7.5mg  strength tablet by mouth on Mondays, Wednesdays and Fridays; all other days, take 1 tablet.   Patient verbalized understanding of these instructions.

## 2016-09-21 NOTE — Progress Notes (Signed)
Indication: Recurrent venous thromboembolism. Duration: Indefinite. INR: At target. Agree with Dr. Groce's assessment and plan. 

## 2016-09-27 ENCOUNTER — Emergency Department (HOSPITAL_COMMUNITY): Payer: Medicare Other

## 2016-09-27 ENCOUNTER — Encounter (HOSPITAL_COMMUNITY): Payer: Self-pay | Admitting: Emergency Medicine

## 2016-09-27 ENCOUNTER — Emergency Department (HOSPITAL_COMMUNITY)
Admission: EM | Admit: 2016-09-27 | Discharge: 2016-09-27 | Disposition: A | Discharge status: Home / Self Care | Payer: Medicare Other | Attending: Emergency Medicine | Admitting: Emergency Medicine

## 2016-09-27 DIAGNOSIS — J45909 Unspecified asthma, uncomplicated: Secondary | ICD-10-CM | POA: Insufficient documentation

## 2016-09-27 DIAGNOSIS — R319 Hematuria, unspecified: Secondary | ICD-10-CM | POA: Diagnosis not present

## 2016-09-27 DIAGNOSIS — I252 Old myocardial infarction: Secondary | ICD-10-CM | POA: Diagnosis not present

## 2016-09-27 DIAGNOSIS — R1030 Lower abdominal pain, unspecified: Secondary | ICD-10-CM | POA: Diagnosis not present

## 2016-09-27 DIAGNOSIS — N189 Chronic kidney disease, unspecified: Secondary | ICD-10-CM | POA: Diagnosis not present

## 2016-09-27 DIAGNOSIS — I129 Hypertensive chronic kidney disease with stage 1 through stage 4 chronic kidney disease, or unspecified chronic kidney disease: Secondary | ICD-10-CM | POA: Diagnosis not present

## 2016-09-27 DIAGNOSIS — Z7901 Long term (current) use of anticoagulants: Secondary | ICD-10-CM | POA: Diagnosis not present

## 2016-09-27 DIAGNOSIS — Z87891 Personal history of nicotine dependence: Secondary | ICD-10-CM | POA: Diagnosis not present

## 2016-09-27 LAB — COMPREHENSIVE METABOLIC PANEL
ALBUMIN: 4 g/dL (ref 3.5–5.0)
ALK PHOS: 95 U/L (ref 38–126)
ALT: 14 U/L (ref 14–54)
AST: 24 U/L (ref 15–41)
Anion gap: 9 (ref 5–15)
BUN: 11 mg/dL (ref 6–20)
CALCIUM: 10.1 mg/dL (ref 8.9–10.3)
CO2: 31 mmol/L (ref 22–32)
CREATININE: 0.72 mg/dL (ref 0.44–1.00)
Chloride: 99 mmol/L — ABNORMAL LOW (ref 101–111)
GFR calc Af Amer: >60 mL/min (ref >60)
GFR calc non Af Amer: >60 mL/min (ref >60)
GLUCOSE: 110 mg/dL — AB (ref 65–99)
Potassium: 3.6 mmol/L (ref 3.5–5.1)
Sodium: 139 mmol/L (ref 135–145)
Total Bilirubin: 0.3 mg/dL (ref 0.3–1.2)
Total Protein: 7.7 g/dL (ref 6.5–8.1)

## 2016-09-27 LAB — CBC WITH DIFFERENTIAL/PLATELET
BASOS PCT: 1 %
Basophils Absolute: 0.1 10*3/uL (ref 0.0–0.1)
EOS ABS: 0.1 10*3/uL (ref 0.0–0.7)
Eosinophils Relative: 2 %
HCT: 41.8 % (ref 36.0–46.0)
HEMOGLOBIN: 13.7 g/dL (ref 12.0–15.0)
LYMPHS ABS: 3.1 10*3/uL (ref 0.7–4.0)
Lymphocytes Relative: 37 %
MCH: 30.4 pg (ref 26.0–34.0)
MCHC: 32.8 g/dL (ref 30.0–36.0)
MCV: 92.9 fL (ref 78.0–100.0)
Monocytes Absolute: 0.4 10*3/uL (ref 0.1–1.0)
Monocytes Relative: 5 %
NEUTROS ABS: 4.7 10*3/uL (ref 1.7–7.7)
NEUTROS PCT: 55 %
Platelets: 250 10*3/uL (ref 150–400)
RBC: 4.5 MIL/uL (ref 3.87–5.11)
RDW: 14.9 % (ref 11.5–15.5)
WBC: 8.4 10*3/uL (ref 4.0–10.5)

## 2016-09-27 LAB — URINALYSIS, ROUTINE W REFLEX MICROSCOPIC
BACTERIA UA: NONE SEEN
BILIRUBIN URINE: NEGATIVE
Glucose, UA: NEGATIVE mg/dL
Ketones, ur: NEGATIVE mg/dL
Nitrite: NEGATIVE
Protein, ur: 100 mg/dL — AB
SPECIFIC GRAVITY, URINE: 1.013 (ref 1.005–1.030)
pH: 5 (ref 5.0–8.0)

## 2016-09-27 LAB — LIPASE, BLOOD: Lipase: 29 U/L (ref 11–51)

## 2016-09-27 LAB — PROTIME-INR
INR: 1.72
PROTHROMBIN TIME: 20.4 s — AB (ref 11.4–15.2)

## 2016-09-27 NOTE — Discharge Instructions (Signed)
You have been seen and evaluated for blood in your urine.  No specific causative factor were identify during this ER visit.  Please follow up with your doctor and with [facility] for further check up to identify the source of your bloody urine. You have an enlarge small bowel loop on CT scan. If your abdominal pain worsen and you develop fever, vomiting then return to the ER for further care.  Otherwise please follow up with your surgeon Dr. Abbey Chattersosenbower for further evaluation.  If your bleeding persists for more than 2 days, please return to the ER.

## 2016-09-27 NOTE — ED Notes (Signed)
Pt to CT

## 2016-09-27 NOTE — ED Triage Notes (Signed)
Pt here for hematuria x 2 days; pt sts some mild abd pain and takes coumadin

## 2016-09-27 NOTE — ED Provider Notes (Signed)
[facility]EMER[facility]ENCY DEPT Provider Note   CSN: [id number] Arrival date & time: 09/27/16  0919     History   Chief Complaint Chief Complaint  Patient presents with  . Hematuria    HPI Tina Chase is a 76 y.o. female.  HPI   76 year old obese female with prior history of PE currently on warfarin, chronic kidney disease, asthma, diabetes presenting complaining of bloody urine. Patient reports yesterday when she urinating and after wiping she noticed blood on the tissue paper. Today while she urinates he noticed blood in her urine. Aside from mild low abdominal discomfort in which she described as a slight crack sensation, she denies having any other significant pain. She report mild burning sensation after urinating at the urethral opening. She denies any associated fever, chills, lightheadedness, dizziness, chest pain, shortness of breath, constipation or diarrhea. She was told that if she develop any abnormal bleeding to come to the ER. Her last INR was 2.1 and it was done on February 19. She does report mild nausea. Denies any other abnormal bleeding, no hematochezia or melena. No back pain. Report remote history of kidney stone but this does not feel similar to kidney stone pain.  Past Medical History:  Diagnosis Date  . Arthritis    left knee  and left shoulder   . Asthma   . 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 Hypercalcemia   . Hyperglycemia   . Hyperlipidemia   . Hyperparathyroidism   . Hypertension   . Myocardial infarction   . Obesity   . Pulmonary embolism (HCC)    Provoked 2013, Unprovoked 2017  . Pulmonary nodule    12mm, stable 2013 to 2017, benign    Patient Active Problem List   Diagnosis Date Noted  . History of pulmonary embolism   . Sleep apnea 07/08/2015  . Bilateral lower extremity edema 01/09/2014  . 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 Nephrolithiasis 05/25/2013  . Hypertension 12/22/2011  . Obesity, Class III, BMI 40-49.9 (morbid  obesity) (HCC) 12/22/2011  . Asthma 12/04/2011  . Single kidney 09/15/2011  . Routine health maintenance 09/15/2011    Past Surgical History:  Procedure Laterality Date  . APPLICATION OF WOUND VAC  12/04/2011   Procedure: APPLICATION OF WOUND VAC;  Surgeon: Adolph Pollack, MD;  Location: [facility];  Service: General;;  x two  . INCISIONAL HERNIA REPAIR  12/04/2011   Procedure: HERNIA REPAIR INCISIONAL;  Surgeon: Adolph Pollack, MD;  Location: [facility];  Service: General;  Laterality: N/A;  . KIDNEY CYST REMOVAL    . LAPAROSCOPIC NEPHRECTOMY  08/19/2011   Procedure: LAPAROSCOPIC NEPHRECTOMY;  Surgeon: Milford Cage, MD;  Location: [facility];  Service: Urology;  Laterality: Right;  . LAPAROTOMY  12/04/2011   Procedure: EXPLORATORY LAPAROTOMY;  Surgeon: Adolph Pollack, MD;  Location: [facility];  Service: General;  Laterality: N/A;   bowel resection   . OTHER SURGICAL HISTORY     D and C x 3   . TUBAL LIGATION      OB History    No data available       Home Medications    Prior to Admission medications   Medication Sig Start Date End Date Taking? Authorizing Provider  allopurinol (ZYLOPRIM) 300 MG tablet Take 300 mg by mouth daily. 05/17/15   Historical Provider, MD  amLODipine (NORVASC) 10 MG tablet Take 1 tablet (10 mg total) by mouth daily. 04/07/16   Tyson Alias, MD  fluticasone (FLOVENT HFA) 44 MCG/ACT inhaler Inhale 2 puffs into the lungs daily. 01/06/16   Tyson Alias, MD  furosemide (LASIX) 20 MG tablet Take 2 tablets (40 mg total) by mouth daily. 05/18/16 05/29/17  Tyson Alias, MD  loperamide (IMODIUM) 2 MG capsule TAKE 1 CAPSULE BY MOUTH DAILY AS NEEDED FOR DIARRHEA OR LOOSE STOOLS 09/11/16   Tyson Alias, MD  metoprolol (LOPRESSOR) 50 MG tablet TAKE 1& 1/2 TABLETS BY MOUTH TWICE DAILY 03/03/16   Tyson Alias, MD  PROAIR HFA 108 [id number] Base) MCG/ACT inhaler INHALE 2 PUFFS INTO THE LUNGS EVERY 4 HOURS AS NEEDED FOR WHEEZING OR SHORTNESS  OF BREATH 12/06/15   Burns Spain, MD  warfarin (COUMADIN) 7.5 MG tablet Take 1/2 tablet on Mondays, Tuesdays, Wednesdays and Fridays. Take 1 tablet on Sundays, Thursdays & Saturdays. 07/20/16   Earl Lagos, MD    Family History Family History  Problem Relation Age of Onset  . Stroke Neg Hx   . Cancer Neg Hx   . Nephrolithiasis Neg Hx   . Hyperlipidemia Mother   . Hypertension Mother   . Heart disease Father     had a heart attack at age 64  . Heart attack Father 52  . Heart disease Brother     at age 3  . Heart attack Brother 37    Social History Social History  Substance Use Topics  . Smoking status: Former Smoker    Packs/day: 0.50    Years: 1.00    Types: Cigarettes    Quit date: 08/03/1968  . Smokeless tobacco: Never Used  . Alcohol use No     Allergies   Hydralazine; Aspirin; Crab [shellfish allergy]; and Lisinopril   Review of Systems Review of Systems  All other systems reviewed and are negative.    Physical Exam Updated Vital Signs BP 165/95 (BP Location: Left Arm) Comment: Pt states did not take her HTN meds this morning  Pulse 109   Temp 98 F (36.7 C) (Oral)   Resp 18   LMP 09/23/1986   SpO2 96%   Physical Exam  Constitutional: She appears well-developed and well-nourished. No distress.  Obese female laying in bed in no acute discomfort.  HENT:  Head: Atraumatic.  Eyes: Conjunctivae are normal.  Neck: Neck supple.  Cardiovascular: Normal rate and regular rhythm.   Pulmonary/Chest: Effort normal and breath sounds normal.  Abdominal: Soft. Bowel sounds are normal. She exhibits no distension. There is tenderness (Very mild superpubic tenderness without guarding or rebound tenderness.).  No CVA tenderness  Neurological: She is alert.  Skin: No rash noted.  Psychiatric: She has a normal mood and affect.  Nursing note and vitals reviewed.    ED Treatments / Results  Labs (all labs ordered are listed, but only abnormal results are  displayed) Labs Reviewed  COMPREHENSIVE METABOLIC PANEL - Abnormal; Notable for the following:       Result Value   Chloride 99 (*)    Glucose, Bld 110 (*)    All other components within normal limits  URINALYSIS, ROUTINE W REFLEX MICROSCOPIC - Abnormal; Notable for the following:    Hgb urine dipstick LARGE (*)    Protein, ur 100 (*)    Leukocytes, UA SMALL (*)    Squamous Epithelial / LPF 0-5 (*)    All other components within normal limits  PROTIME-INR - Abnormal; Notable for the following:    Prothrombin Time 20.4 (*)    All other components within  normal limits  CBC WITH DIFFERENTIAL/PLATELET  LIPASE, BLOOD    EKG  EKG Interpretation None       Radiology Ct Renal Stone Study  Result Date: 09/27/2016 CLINICAL DATA:  Hematuria. EXAM: CT ABDOMEN AND PELVIS WITHOUT CONTRAST TECHNIQUE: Multidetector CT imaging of the abdomen and pelvis was performed following the standard protocol without IV contrast. COMPARISON:  10/25/2015 FINDINGS: Lower chest: No acute abnormality. Hepatobiliary: No focal liver abnormality is seen. No gallstones, gallbladder wall thickening, or biliary dilatation. Pancreas: Unremarkable. No pancreatic ductal dilatation or surrounding inflammatory changes. Spleen: Normal in size without focal abnormality. Adrenals/Urinary Tract: The adrenal glands are normal. Status post right nephrectomy. The left kidney appears within normal limits. Urinary bladder appears normal. Stomach/Bowel: The stomach is normal. The small bowel loops have a normal caliber without obstruction. There is a large hernia within the right lower quadrant ventral abdominal wall which contains several loops of small bowel. One of these loops is abnormally increased in caliber measuring 5.2 cm, image 59 of series 201. No pathologic dilatation of the colon. Vascular/Lymphatic: Aortic atherosclerosis. No upper abdominal or pelvic lymph nodes. Reproductive:  Unremarkable appearance of the uterus. Other:  There is no ascites or focal fluid collections within the abdomen or pelvis. Musculoskeletal: No acute or significant osseous findings. IMPRESSION: 1. No acute urinary tract abnormality and no explanation for patient's hematuria and dysuria. 2. Right lower quadrant ventral abdominal wall is identified containing a dilated loop of small bowel which measures up to 5.2 cm. Correlate for any clinical signs or symptoms of small bowel obstruction. 3. Aortic atherosclerosis Electronically Signed   By: Signa Kellaylor  Stroud M.D.   On: 09/27/2016 14:04    Procedures Procedures (including critical care time)  Medications Ordered in ED Medications - No data to display   Initial Impression / Assessment and Plan / ED Course  I have reviewed the triage vital signs and the nursing notes.  Pertinent labs & imaging results that were available during my care of the patient were reviewed by me and considered in my medical decision making (see chart for details).     BP 159/82 (BP Location: Left Arm) Comment (BP Location): BP taken in forearm   Pulse 78   Temp 97.6 F (36.4 C) (Oral)   Resp 16   LMP 09/23/1986   SpO2 95%    Final Clinical Impressions(s) / ED Diagnoses   Final diagnoses:  Hematuria, unspecified type    New Prescriptions New Prescriptions   No medications on file   10:12 AM Patient with complaints of hematuria. Currently on warfarin. Aside from mild superpubic tenderness she does not have any significant abdominal pain pain and she is well-appearing. Last INR was therapeutic. No recent change in medication. Workup initiated.  2:37 PM Today INR is subtherapeutic at 1.72. Her labs are otherwise reassuring. Examination of her genitalia with chaperone reveals no evidence of external bleeding, from the vagina, or the perineum. No rectal bleeding. Urinalysis demonstrating large global urine dipsticks without signs of infection. Abdominal and pelvis CT scan demonstrated no acute urinary tract  abnormality to explain patient's hematuria. There is a dilated loop of small bowel with measure up to 5.2 cm on the right lower quadrant. Although patient does have some mild discomfort in that area she said the pain is minimal and similar to prior. She does not have any associated vomiting. She has normal bowel movement. I discussed this finding with patient and states that this a possibility of small bowel structure in  however this is not the finding consistence with patient present complaint. Patient felt comfortable follow-up outpatient with her surgeon Dr. Clotilde Dieter power and she is aware to return promptly if her abdominal pain worsened. Otherwise, encouraged patient to follow with regular provider and with urologist for further evaluation of her hematuria. She is to hold off her warfarin for 1 day and resume afterward.  Care discussed with Dr. Rush Landmark.      Fayrene Helper, PA-C 09/27/16 1520    Canary Brim Tegeler, MD 09/27/16 [phone]

## 2016-09-30 ENCOUNTER — Ambulatory Visit (INDEPENDENT_AMBULATORY_CARE_PROVIDER_SITE_OTHER): Payer: Medicare Other | Admitting: Internal Medicine

## 2016-09-30 VITALS — BP 116/62 | HR 61 | Temp 97.8°F | Wt 310.7 lb

## 2016-09-30 DIAGNOSIS — Z7901 Long term (current) use of anticoagulants: Secondary | ICD-10-CM

## 2016-09-30 DIAGNOSIS — Z86711 Personal history of pulmonary embolism: Secondary | ICD-10-CM | POA: Diagnosis not present

## 2016-09-30 DIAGNOSIS — Z87891 Personal history of nicotine dependence: Secondary | ICD-10-CM | POA: Diagnosis not present

## 2016-09-30 DIAGNOSIS — Z905 Acquired absence of kidney: Secondary | ICD-10-CM | POA: Diagnosis not present

## 2016-09-30 DIAGNOSIS — R310 Gross hematuria: Secondary | ICD-10-CM | POA: Insufficient documentation

## 2016-09-30 DIAGNOSIS — Z87442 Personal history of urinary calculi: Secondary | ICD-10-CM

## 2016-09-30 LAB — POCT URINALYSIS DIPSTICK
BILIRUBIN UA: NEGATIVE
GLUCOSE UA: NEGATIVE
Ketones, UA: NEGATIVE
Leukocytes, UA: NEGATIVE
Nitrite, UA: NEGATIVE
SPEC GRAV UA: 1.02
Urobilinogen, UA: 0.2
pH, UA: 6

## 2016-09-30 NOTE — Progress Notes (Signed)
Case discussed with Dr. Lawerance BachBurns at the time of the visit. We reviewed the resident's history and exam and pertinent patient test results. I agree with the assessment, diagnosis, and plan of care documented in the resident's note.  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  She has not been on potassium citrate recently as this was apparently stopped.  She also has a history of an unprovoked venous thromboembolism and the patient and PCP decided upon indefinite anticoagulation.  She presents with gross hematuria and a stone protocol CT scan demonstrates no stone or other explanation for the gross hematuria.  She is a former smoker though.  Differential includes a recurrence of her potassium citrate stone with passage prior to scanning Vs a bleeding bladder lesion unmasked by the anticoagulation.  She is been set up to see her urologist in about 1 week to be reassessed and considered for cystoscopy.  It is very reasonable to hold the coumadin currently while this evaluation proceeds.  Reintroduction can be addressed by the pt and PCP after completion of her evaluation for gross hematuria.

## 2016-09-30 NOTE — Progress Notes (Signed)
    CC: Follow-up for hematuria  HPI: [name] is a 76 y.o. female with PMHx of hypertension, OSA, asthma, history of PE who presents to the clinic for ED follow-up for hematuria.   Patient presented to the emergency department on February 25 with complaint of gross hematuria. She denied any symptoms of dysuria or new abdominal pain, nausea, vomiting, diarrhea, hematochezia, melena. She denied vaginal bleeding. Pelvic exam and external genitalia exam did not show evidence of bleeding. Urinalysis revealed large hemoglobin, red blood cells too numerous to count and small leukocytes, not concerning for infection. A CT renal stone study was done which showed no acute changes to explain her gross hematuria. 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 Patient was previously on potassium citrate, which she states was stopped by her urologist as she was not metabolizing it. She has not had recurrent renal stones since her nephrectomy. Renal function was normal in the emergency department. She was instructed to hold her Coumadin for 1 day and follow-up with her primary care physician. Today, patient states that her hematuria has improved, but she continues to notice pink urine. She denies any other sources of bleeding, nausea, vomiting, abdominal pain. She follows with Dr. Berneice Chase with urology, but does not have a follow-up appointment with him until July. Patient has not restarted her coumadin since 2/25 as she feels much better off of her Coumadin.   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 Decision was made between the patient and her primary care physician to continue Coumadin indefinitely. Coumadin was chosen over a DOAC given her BMI. She denies chest pain, shortness of breath, calf pain. Patient questions if she can stop the Coumadin or be switched to a different anticoagulant.  Past Medical  History:  Diagnosis Date  . Arthritis    left knee  and left shoulder   . Asthma   . 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 Hypercalcemia   . Hyperglycemia   . Hyperlipidemia   . Hyperparathyroidism   . Hypertension   . Myocardial infarction   . Obesity   . Pulmonary embolism (HCC)    Provoked 2013, Unprovoked 2017  . Pulmonary nodule    12mm, stable 2013 to 2017, benign     Review of Systems: Please see pertinent ROS reviewed in HPI and problem based charting.   Physical Exam: Vitals:   09/30/16 0905 09/30/16 0934  BP: (!) 161/71 116/62  Pulse: 68 61  Temp: 97.8 F (36.6 C)   TempSrc: Oral   SpO2: 95%   Weight: (!) 310 lb 11.2 oz (140.9 kg)    General: Vital signs reviewed.  Patient is well-developed and well-nourished, in no acute distress and cooperative with exam.  Cardiovascular: RRR, S1 normal, S2 normal, no murmurs, gallops, or rubs. Pulmonary/Chest: Clear to auscultation bilaterally, no wheezes, rales, or rhonchi. Abdominal: Soft, non-tender, non-distended, obese Extremities: 2+ non-pitting lower extremity edema bilaterally Skin: Warm, dry and intact. No rashes or erythema. Psychiatric: Normal mood and affect. speech and behavior is normal. Cognition and memory are normal.   Assessment & Plan:  See encounters tab for problem based medical decision making. Patient discussed with Dr. Josem KaufmannKlima

## 2016-09-30 NOTE — Assessment & Plan Note (Signed)
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 Decision was made between the patient and her primary care physician to continue Coumadin indefinitely partially due to the large clot burden in size, almost saddle PE. Coumadin was chosen over a DOAC given her BMI. She denies chest pain, shortness of breath, calf pain. Patient questions if she can stop the Coumadin or be switched to a different anticoagulant. Coumadin is currently being held in the setting of gross hematuria.   Assessment: History of pulmonary embolism  Plan: -Holding Coumadin in the setting of gross hematuria, currently undergoing workup with urology -Patient and primary care provider to discuss benefit and risk of continuing Coumadin -Patient could be considered for discontinuation of anticoagulation given his 6 month treatment of an unprovoked PE -However, given the large clot burden in size and other risk factors, it may be reasonable to continue anticoagulation with Coumadin indefinitely if her current hematuria resolves -Patient will follow up in 1 week for further discussion and I will send this conversation to her primary care provider -The patient is truly intolerant of Coumadin and desires to continue anticoagulation, a DOAC could be considered although not favorable

## 2016-09-30 NOTE — Patient Instructions (Signed)
Do not take your coumadin until we see you in follow up. We will schedule you an appointment with Dr. Berneice HeinrichManny for evaluation for blood in your urine.   Follow up in one week.

## 2016-09-30 NOTE — Assessment & Plan Note (Signed)
Patient presented to the emergency department on February 25 with complaint of gross hematuria. She denied any symptoms of dysuria or new abdominal pain, nausea, vomiting, diarrhea, hematochezia, melena. She denied vaginal bleeding. Pelvic exam and external genitalia exam did not show evidence of bleeding. Urinalysis revealed large hemoglobin, red blood cells too numerous to count and small leukocytes, not concerning for infection. A CT renal stone study was done which showed no acute changes to explain her gross hematuria. 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 Patient was previously on potassium citrate, which she states was stopped by her urologist as she was not metabolizing it. She has not had recurrent renal stones since her nephrectomy. Renal function was normal in the emergency department. She was instructed to hold her Coumadin for 1 day and follow-up with her primary care physician.   Today, patient states that her hematuria has improved, but she continues to notice pink urine. She denies any other sources of bleeding, nausea, vomiting, abdominal pain. She follows with Dr. Berneice HeinrichManny with urology, but does not have a follow-up appointment with him until July. Patient has not restarted her coumadin since 2/25 as she feels much better off of her Coumadin. Repeat urine dipstick today shows moderate blood and negative nitrites and negative leukocytes negative bilirubin.  Assessment: Gross hematuria likely secondary to recurrent renal stone, or intra-vesicular pathology  Plan: -Follow-up with urology on 10/07/2016 for consideration of cystoscopy and restarting potassium citrate to prevent recurrent nephrolithiasis -Continue to hold Coumadin for now -Follow-up in one week

## 2016-09-30 NOTE — Addendum Note (Signed)
Addended by: Maura CrandallGOLDSTON, Nathanuel Cabreja C on: 09/30/2016 12:11 PM   Modules accepted: Orders

## 2016-10-01 ENCOUNTER — Ambulatory Visit (INDEPENDENT_AMBULATORY_CARE_PROVIDER_SITE_OTHER): Payer: Medicare Other | Admitting: Podiatry

## 2016-10-01 ENCOUNTER — Encounter: Payer: Self-pay | Admitting: Podiatry

## 2016-10-01 ENCOUNTER — Other Ambulatory Visit: Payer: Self-pay | Admitting: Student in an Organized Health Care Education/Training Program

## 2016-10-01 DIAGNOSIS — M722 Plantar fascial fibromatosis: Secondary | ICD-10-CM | POA: Diagnosis not present

## 2016-10-01 DIAGNOSIS — M2142 Flat foot [pes planus] (acquired), left foot: Secondary | ICD-10-CM

## 2016-10-01 DIAGNOSIS — M2141 Flat foot [pes planus] (acquired), right foot: Secondary | ICD-10-CM

## 2016-10-01 LAB — URINALYSIS, ROUTINE W REFLEX MICROSCOPIC
BILIRUBIN UA: NEGATIVE
GLUCOSE, UA: NEGATIVE
KETONES UA: NEGATIVE
LEUKOCYTES UA: NEGATIVE
Nitrite, UA: NEGATIVE
SPEC GRAV UA: 1.019 (ref 1.005–1.030)
Urobilinogen, Ur: 0.2 mg/dL (ref 0.2–1.0)
pH, UA: 5.5 (ref 5.0–7.5)

## 2016-10-01 LAB — MICROSCOPIC EXAMINATION: Casts: NONE SEEN /lpf

## 2016-10-01 MED ORDER — TRIAMCINOLONE ACETONIDE 10 MG/ML IJ SUSP
10.0000 mg | Freq: Once | INTRAMUSCULAR | Status: AC
  Administered 2016-10-01: 10 mg

## 2016-10-02 NOTE — Progress Notes (Signed)
Subjective:     Patient ID: Tina Chase, female   DOB: 1941-04-06, 76 y.o.   MRN: [id number]  HPI patient states the left foot is improving with an area that still is quite discomforting with ambulation   Review of Systems     Objective:   Physical Exam Neurovascular status intact negative Homans sign was noted with patient still having discomfort in the left plantar heel at the insertional point tendon calcaneus of a moderate nature    Assessment:     Acute plantar fasciitis improving but present    Plan:     Advised on physical therapy anti-inflammatories and reinjected the fascia 3 mg Kenalog 5 mill grams Xylocaine and discussed continued conservative treatment

## 2016-10-07 ENCOUNTER — Ambulatory Visit: Payer: Medicare Other

## 2016-10-09 ENCOUNTER — Other Ambulatory Visit: Payer: Self-pay | Admitting: Internal Medicine

## 2016-10-12 ENCOUNTER — Ambulatory Visit (INDEPENDENT_AMBULATORY_CARE_PROVIDER_SITE_OTHER): Payer: Medicare Other | Admitting: Internal Medicine

## 2016-10-12 DIAGNOSIS — R310 Gross hematuria: Secondary | ICD-10-CM

## 2016-10-12 DIAGNOSIS — Z87448 Personal history of other diseases of urinary system: Secondary | ICD-10-CM | POA: Diagnosis not present

## 2016-10-12 DIAGNOSIS — J9601 Acute respiratory failure with hypoxia: Secondary | ICD-10-CM | POA: Diagnosis not present

## 2016-10-12 DIAGNOSIS — Z09 Encounter for follow-up examination after completed treatment for conditions other than malignant neoplasm: Secondary | ICD-10-CM | POA: Diagnosis not present

## 2016-10-12 NOTE — Progress Notes (Signed)
   CC: hematuria  HPI:  [name] is a 76 y.o. with PMHx as outlined below who presents to clinic for hematuria follow up. Please see problem list for further details of patient's chronic medical issues.   Past Medical History:  Diagnosis Date  . Arthritis    left knee  and left shoulder   . Asthma   . 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 Hypercalcemia   . Hyperglycemia   . Hyperlipidemia   . Hyperparathyroidism   . Hypertension   . Myocardial infarction   . Obesity   . Pulmonary embolism (HCC)    Provoked 2013, Unprovoked 2017  . Pulmonary nodule    12mm, stable 2013 to 2017, benign    Review of Systems:  Denies CP, palpitations, SOB, hematuria, and weakness.   Physical Exam:  Vitals:   10/12/16 1027  BP: (!) 159/70  Pulse: 65  Temp: 97.9 F (36.6 C)  TempSrc: Oral  SpO2: 96%  Weight: (!) 315 lb 12.8 oz (143.2 kg)  Height: 5\' 4"  (1.626 m)   Physical Exam  Constitutional:  Obese, appears well-developed and well-nourished. No distress.  HENT:  Head: Normocephalic and atraumatic.  Nose: Nose normal.  Cardiovascular: Normal rate, regular rhythm and normal heart sounds.  Exam reveals no gallop and no friction rub.   No murmur heard. Pulmonary/Chest: Effort normal and breath sounds normal. No respiratory distress.  has no wheezes.no rales.  Abdominal: Soft. Bowel sounds are normal.  exhibits no distension. There is no tenderness. There is no rebound and no guarding.  Neurological: alert and oriented to person, place, and time.  Skin: Skin is warm and dry. No rash noted.  not diaphoretic. No erythema. No pallor.    Assessment & Plan:   See Encounters Tab for problem based charting.  Patient discussed with Dr. Josem KaufmannKlima

## 2016-10-12 NOTE — Progress Notes (Signed)
Case discussed with Dr. Truong at the time of the visit.  We reviewed the resident's history and exam and pertinent patient test results.  I agree with the assessment, diagnosis, and plan of care documented in the resident's note. 

## 2016-10-12 NOTE — Assessment & Plan Note (Signed)
A: pt states gross hematuria has resolved and she feels better compared to when she was last seen in the ED in February. She is no longer feeling weak. She has been off of her coumadin since her last clinic visit. Her urology appt has been rescheduled from 3/7 to the 15th for her procedure.   P: f/u on 3/19 after her INR check to resume coumadin pending cystoscopy results. ED return precautions given.

## 2016-10-15 DIAGNOSIS — R310 Gross hematuria: Secondary | ICD-10-CM | POA: Diagnosis not present

## 2016-10-15 DIAGNOSIS — N200 Calculus of kidney: Secondary | ICD-10-CM | POA: Diagnosis not present

## 2016-10-17 DIAGNOSIS — T8189XA Other complications of procedures, not elsewhere classified, initial encounter: Secondary | ICD-10-CM | POA: Diagnosis not present

## 2016-10-17 DIAGNOSIS — G4733 Obstructive sleep apnea (adult) (pediatric): Secondary | ICD-10-CM | POA: Diagnosis not present

## 2016-10-19 ENCOUNTER — Encounter: Payer: Self-pay | Admitting: Internal Medicine

## 2016-10-19 ENCOUNTER — Ambulatory Visit (INDEPENDENT_AMBULATORY_CARE_PROVIDER_SITE_OTHER): Payer: Medicare Other | Admitting: Internal Medicine

## 2016-10-19 ENCOUNTER — Ambulatory Visit (INDEPENDENT_AMBULATORY_CARE_PROVIDER_SITE_OTHER): Payer: Medicare Other | Admitting: Pharmacist

## 2016-10-19 VITALS — BP 152/79 | HR 67 | Temp 97.9°F | Ht 64.0 in | Wt 310.0 lb

## 2016-10-19 DIAGNOSIS — Z86711 Personal history of pulmonary embolism: Secondary | ICD-10-CM

## 2016-10-19 DIAGNOSIS — Z7901 Long term (current) use of anticoagulants: Secondary | ICD-10-CM | POA: Diagnosis not present

## 2016-10-19 DIAGNOSIS — Z9889 Other specified postprocedural states: Secondary | ICD-10-CM

## 2016-10-19 DIAGNOSIS — Z09 Encounter for follow-up examination after completed treatment for conditions other than malignant neoplasm: Secondary | ICD-10-CM

## 2016-10-19 DIAGNOSIS — Z87448 Personal history of other diseases of urinary system: Secondary | ICD-10-CM

## 2016-10-19 DIAGNOSIS — R310 Gross hematuria: Secondary | ICD-10-CM

## 2016-10-19 LAB — POCT INR: INR: 1

## 2016-10-19 MED ORDER — ENOXAPARIN SODIUM 150 MG/ML ~~LOC~~ SOLN: 150.0000 mg | Freq: Two times a day (BID) | SUBCUTANEOUS | 0 refills | Status: DC

## 2016-10-19 MED ORDER — POTASSIUM CITRATE ER 15 MEQ (1620 MG) PO TBCR: 1.0000 | EXTENDED_RELEASE_TABLET | Freq: Two times a day (BID) | ORAL | 4 refills | Status: DC

## 2016-10-19 NOTE — Assessment & Plan Note (Signed)
Assessment: Patient presents to clinic for follow-up of gross hematuria. She denies any recent episodes of hematuria. She had a cystoscopy done by Dr. Kathrynn RunningManning on March 15 and was told this was negative and that [facility] She saw Dr. Alexandria LodgeGroce today and INR was found to be 1.00 with goal of 2-3 for hx of unprovoked PE.   P: Resume AC. Start on lovenox for bridging, she was given samples by pharmacy. F/u on 3/23 for INR check. Resume potassium citrate 50meq BID to prevent recurrence of citrate stones they likely caused her gross hematuria as urothelial malignancy has now been ruled out by cystoscopy.

## 2016-10-19 NOTE — Progress Notes (Signed)
Anti-Coagulation Progress Note  Tina Chase is a 76 y.o. female who is currently on an anti-coagulation regimen.    RECENT RESULTS: Recent results are below, the most recent result is correlated with a dose of ZERO mg. per week--has been OFF warfarin as [facility] ED Physician [facility]  Lab Results  Component Value Date   INR 1.0 10/19/2016   INR 1.72 09/27/2016   INR 2.10 09/21/2016    ANTI-COAG DOSE: Anticoagulation Dose Instructions as of 10/19/2016      Glynis SmilesSun Mon Tue Wed Thu Fri Sat   New Dose 7.5 mg 7.5 mg 7.5 mg 7.5 mg 3.75 mg 7.5 mg 3.75 mg    Description   Take 1 tablet by mouth once-daily at [facility]6PM on Monday, Tuesday, Wednesday, Friday and Sunday; on Thursday and Saturday--take only 1/2 tablet of your 7.5mg  yellow warfarin tablet.       ANTICOAG SUMMARY: Anticoagulation Episode Summary    Current INR goal:   2.0-3.0  TTR:   78.4 % (10.9 mo)  Next INR check:   10/26/2016  INR from last check:   1.0! (10/19/2016)  Weekly max dose:     Target end date:   Indefinite  [facility]   [facility]  Preferred lab:     Send INR reminders to:      Indications   History of pulmonary embolism [Z86.711]       Comments:   Per discharge summary for her acute PE, indicates 3 months warfarin, then switch to rivaroxaban for indeterminate length of therapy in setting of recurrence of VTE        ANTICOAG TODAY: Anticoagulation Summary  As of 10/19/2016   INR goal:   2.0-3.0  TTR:     Today's INR:   1.0!  Next INR check:   10/26/2016  Target end date:   Indefinite   Indications   History of pulmonary embolism [Z86.711]        Anticoagulation Episode Summary    IN[facility] check [facility]   [facility]   Preferred lab:      Send INR reminders to:      Comments:   Per discharge summary for her acute PE, indicates 3 months warfarin, then switch to rivaroxaban for indeterminate length of therapy in setting of recurrence of VTE      PATIENT  INSTRUCTIONS: Patient Instructions  Patient instructed to take medications as defined in the Anti-coagulation Track section of this encounter.  Patient instructed to take today's dose.  Patient instructed to take 1 tablet of your 7.5mg  yellow colored warfarin tablets on Monday, Tuesday, Wednesday, Friday and Sunday; on Thursday and Saturday--take only 1/2 tablet.  Patient verbalized understanding of these instructions.       FOLLOW-UP Return in 7 days (on 10/26/2016) for Follow up INR at 0915h.  Hulen LusterJames Krista Som, III Pharm.D., CACP

## 2016-10-19 NOTE — Patient Instructions (Signed)
Patient instructed to take medications as defined in the Anti-coagulation Track section of this encounter.  Patient instructed to take today's dose.  Patient instructed to take 1 tablet of your 7.5mg  yellow colored warfarin tablets on Monday, Tuesday, Wednesday, Friday and Sunday; on Thursday and Saturday--take only 1/2 tablet.  Patient verbalized understanding of these instructions.

## 2016-10-19 NOTE — Progress Notes (Signed)
Internal Medicine Clinic Attending  Case discussed with Dr. Danella Pentonruong at the time of the visit.  We reviewed the resident's history and exam and pertinent patient test results.  I agree with the assessment, diagnosis, and plan of care documented in the resident's note. Pt had unprovoked clot previously which was submassive. Agree risk of lovenox less than risk of another PE.

## 2016-10-19 NOTE — Patient Instructions (Signed)
You can start taking potassium citrate tabs twice a day.   Start taking a lovenox injection daily until you are seen in clinic again for an INR check.

## 2016-10-19 NOTE — Progress Notes (Signed)
   CC: Hematuria  HPI:  Ms.Helayna Shela CommonsJ [name] is a 76 y.o. with past medical history as outlined below who presents to clinic for hematuria follow-up. Please see problem was further details of patient's chronic medical issues.  Past Medical History:  Diagnosis Date  . Arthritis    left knee  and left shoulder   . Asthma   . 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 Hypercalcemia   . Hyperglycemia   . Hyperlipidemia   . Hyperparathyroidism   . Hypertension   . Myocardial infarction   . Obesity   . Pulmonary embolism (HCC)    Provoked 2013, Unprovoked 2017  . Pulmonary nodule    12mm, stable 2013 to 2017, benign    Review of Systems:  Denies fevers, nausea, vomiting, flank pain, abdominal pain, gross hematuria.  Physical Exam:  Vitals:   10/19/16 1004  BP: (!) 152/79  Pulse: 67  Temp: 97.9 F (36.6 C)  TempSrc: Oral  SpO2: 96%  Weight: (!) 310 lb (140.6 kg)  Height: 5\' 4"  (1.626 m)  Physical Exam  Constitutional: appears well-developed and well-nourished. No distress.  HENT:  Head: Normocephalic and atraumatic.  Nose: Nose normal.  Cardiovascular: Normal rate, regular rhythm and normal heart sounds.  Exam reveals no gallop and no friction rub.   No murmur heard. Pulmonary/Chest: Effort normal and breath sounds normal. No respiratory distress.  has no wheezes.no rales.   Neurological: alert and oriented to person, place, and time.  Skin: Skin is warm and dry. No rash noted.  not diaphoretic. No erythema. No pallor.    Assessment & Plan:   See Encounters Tab for problem based charting.  Patient discussed with Dr. Rogelia BogaButcher

## 2016-10-22 ENCOUNTER — Telehealth: Payer: Self-pay | Admitting: Student in an Organized Health Care Education/Training Program

## 2016-10-22 NOTE — Telephone Encounter (Signed)
APT. REMINDER CALL, LMTCB °

## 2016-10-23 ENCOUNTER — Ambulatory Visit (INDEPENDENT_AMBULATORY_CARE_PROVIDER_SITE_OTHER): Payer: Medicare Other | Admitting: Pharmacist

## 2016-10-23 DIAGNOSIS — Z87891 Personal history of nicotine dependence: Secondary | ICD-10-CM | POA: Diagnosis not present

## 2016-10-23 DIAGNOSIS — M542 Cervicalgia: Secondary | ICD-10-CM | POA: Insufficient documentation

## 2016-10-23 DIAGNOSIS — I252 Old myocardial infarction: Secondary | ICD-10-CM | POA: Diagnosis not present

## 2016-10-23 DIAGNOSIS — Z7901 Long term (current) use of anticoagulants: Secondary | ICD-10-CM

## 2016-10-23 DIAGNOSIS — I129 Hypertensive chronic kidney disease with stage 1 through stage 4 chronic kidney disease, or unspecified chronic kidney disease: Secondary | ICD-10-CM | POA: Insufficient documentation

## 2016-10-23 DIAGNOSIS — J45909 Unspecified asthma, uncomplicated: Secondary | ICD-10-CM | POA: Insufficient documentation

## 2016-10-23 DIAGNOSIS — Z79899 Other long term (current) drug therapy: Secondary | ICD-10-CM | POA: Diagnosis not present

## 2016-10-23 DIAGNOSIS — N189 Chronic kidney disease, unspecified: Secondary | ICD-10-CM | POA: Diagnosis not present

## 2016-10-23 DIAGNOSIS — Z86711 Personal history of pulmonary embolism: Secondary | ICD-10-CM

## 2016-10-23 LAB — POCT INR: INR: 1.4

## 2016-10-23 NOTE — Patient Instructions (Signed)
Patient instructed to take medications as defined in the Anti-coagulation Track section of this encounter.  Patient instructed to take today's dose.  Patient verbalized understanding of these instructions.  Patient instructed to continue bridging with Lovenox due to her low INR. She takes it at 7 AM and 7 PM. Patient will follow-up on 3/26 at 1000 AM.

## 2016-10-23 NOTE — Progress Notes (Signed)
Anti-Coagulation Progress Note  [name] is a 76 y.o. female who is currently on an anti-coagulation regimen.    RECENT RESULTS: Recent results are below, the most recent result is correlated with a dose of 45 mg. per week of which she had only taken 26.25 mg (resumed on Monday 3/19): Lab Results  Component Value Date   INR 1.4 10/23/2016   INR 1.0 10/19/2016   INR 1.72 09/27/2016    ANTI-COAG DOSE: Anticoagulation Dose Instructions as of 10/23/2016      Glynis SmilesSun Mon Tue Wed Thu Fri Sat   New Dose 7.5 mg 7.5 mg 7.5 mg 7.5 mg 3.75 mg 7.5 mg 3.75 mg    Description   Take 1 tablet by mouth once-daily at [facility]6PM on Monday, Tuesday, Wednesday, Friday and Sunday; on Thursday and Saturday--take only 1/2 tablet of your 7.5mg  yellow warfarin tablet.       ANTICOAG SUMMARY: Anticoagulation Episode Summary    Current INR goal:   2.0-3.0  TTR:   77.5 % (11 mo)  Next INR check:   10/26/2016  INR from last check:   1.4! (10/23/2016)  Weekly max dose:     Target end date:   Indefinite  INR check [facility]   [facility]  Preferred lab:     Send INR reminders to:      Indications   History of pulmonary embolism [Z86.711]       Comments:   Per discharge summary for her acute PE, indicates 3 months warfarin, then switch to rivaroxaban for indeterminate length of therapy in setting of recurrence of VTE        ANTICOAG TODAY: Anticoagulation Summary  As of 10/23/2016   INR goal:   2.0-3.0  TTR:     Today's INR:   1.4!  Next INR check:   10/26/2016  Target end date:   Indefinite   Indications   History of pulmonary embolism [Z86.711]        Anticoagulation Episode Summary    INR check [facility]   [facility]   Preferred lab:      Send INR reminders to:      Comments:   Per discharge summary for her acute PE, indicates 3 months warfarin, then switch to rivaroxaban for indeterminate length of therapy in setting of recurrence of VTE      PATIENT INSTRUCTIONS: Patient  instructed to take medications as defined in the Anti-coagulation Track section of this encounter.  Patient instructed to take today's dose.  Patient verbalized understanding of these instructions.  Patient instructed to continue bridging with Lovenox due to her low INR. She takes it at 7 AM and 7 PM. Patient will follow-up on 3/26 at 1000 AM.   FOLLOW-UP Continue taking 1 tablet of your warfarin on Sunday, Monday, Tuesday, Wednesday, and Friday. Take 1/2 tablet on Thursday and Saturday. Weekly dose likely not in full effect- Patient has only taken 4 doses.  Continue Lovenox 150 mg every 12 hours.  Return in about 3 days (around 10/26/2016) for INR check at 10:00.   Bailey MechEmily Stewart, PharmD PGY1 Pharmacy Resident Pager: [phone]

## 2016-10-24 ENCOUNTER — Emergency Department (HOSPITAL_COMMUNITY)
Admission: EM | Admit: 2016-10-24 | Discharge: 2016-10-24 | Disposition: A | Discharge status: Home / Self Care | Payer: Medicare Other | Attending: Emergency Medicine | Admitting: Emergency Medicine

## 2016-10-24 ENCOUNTER — Encounter (HOSPITAL_COMMUNITY): Payer: Self-pay | Admitting: *Deleted

## 2016-10-24 DIAGNOSIS — M542 Cervicalgia: Secondary | ICD-10-CM

## 2016-10-24 NOTE — ED Triage Notes (Signed)
The pt has  Had intermittent neck pain for a long time  For the past 24 hours she has had neck pain  And she has taken tylenol and that takes the pain away  No pain at present

## 2016-10-24 NOTE — ED Provider Notes (Signed)
[facility]-EMERGENCY DEPT Provider Note   CSN: [id number] Arrival date & time: 10/23/16  2353  By signing my name below, I, Tina Chase, attest that this documentation has been prepared under the direction and in the presence of Tina Bilis, MD.  Electronically Signed: Octavia Chase, ED Scribe. 10/24/16. 3:04 AM.    History   Chief Complaint Chief Complaint  Patient presents with  . Torticollis   The history is provided by the patient. No language interpreter was used.   HPI Comments: Tina Chase is a 76 y.o. female who has a PMhx of CKD, hypercalcemia, hyperglycemia, HLD, HTN, hyperparathyroidism, MI, PE, presents to the Emergency Department complaining of acute, on-chronic left sided neck pain that has been intermittent for the past 3 years. She says her neck pain has been progressively worsening after having neck surgery. Pt reports that her pain is exacerbated with certain movements and comes on randomly. She has taken tylenol to alleviate her pain without relief. She is currently on Coumadin. Pt denies any other symptoms or complaints.   PCP: Tyson Alias, MD  Past Medical History:  Diagnosis Date  . Arthritis    left knee  and left shoulder   . Asthma   . 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 Hypercalcemia   . Hyperglycemia   . Hyperlipidemia   . Hyperparathyroidism   . Hypertension   . Myocardial infarction   . Obesity   . Pulmonary embolism (HCC)    Provoked 2013, Unprovoked 2017  . Pulmonary nodule    12mm, stable 2013 to 2017, benign    Patient Active Problem List   Diagnosis Date Noted  . Gross hematuria 09/30/2016  . History of pulmonary embolism   . Sleep apnea 07/08/2015  . Bilateral lower extremity edema 01/09/2014  . 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 Nephrolithiasis 05/25/2013  . Hypertension 12/22/2011  . Obesity, Class III, BMI 40-49.9 (morbid obesity) (HCC) 12/22/2011  . Asthma 12/04/2011  . Single kidney 09/15/2011    . Routine health maintenance 09/15/2011    Past Surgical History:  Procedure Laterality Date  . APPLICATION OF WOUND VAC  12/04/2011   Procedure: APPLICATION OF WOUND VAC;  Surgeon: Adolph Pollack, MD;  Location: [facility];  Service: General;;  x two  . INCISIONAL HERNIA REPAIR  12/04/2011   Procedure: HERNIA REPAIR INCISIONAL;  Surgeon: Adolph Pollack, MD;  Location: [facility];  Service: General;  Laterality: N/A;  . KIDNEY CYST REMOVAL    . LAPAROSCOPIC NEPHRECTOMY  08/19/2011   Procedure: LAPAROSCOPIC NEPHRECTOMY;  Surgeon: Milford Cage, MD;  Location: [facility];  Service: Urology;  Laterality: Right;  . LAPAROTOMY  12/04/2011   Procedure: EXPLORATORY LAPAROTOMY;  Surgeon: Adolph Pollack, MD;  Location: [facility];  Service: General;  Laterality: N/A;   bowel resection   . OTHER SURGICAL HISTORY     D and C x 3   . TUBAL LIGATION      OB History    No data available       Home Medications    Prior to Admission medications   Medication Sig Start Date End Date Taking? Authorizing Provider  acetaminophen (TYLENOL) 325 MG tablet Take 975 mg by mouth every 6 (six) hours as needed for moderate pain or headache.    Historical Provider, MD  allopurinol (ZYLOPRIM) 300 MG tablet Take 300 mg by mouth daily. 05/17/15   Historical Provider, MD  amLODipine (NORVASC) 10 MG tablet TAKE 1  TABLET(10 MG) BY MOUTH DAILY 10/02/16   Tyson Aliasuncan Thomas Vincent, MD  enoxaparin (LOVENOX) 150 MG/ML injection Inject 1 mL (150 mg total) into the skin every 12 (twelve) hours. 10/19/16   Denton Brickiana M Truong, MD  fluticasone (FLOVENT HFA) 44 MCG/ACT inhaler Inhale 2 puffs into the lungs daily. 01/06/16   Tyson Aliasuncan Thomas Vincent, MD  furosemide (LASIX) 20 MG tablet Take 2 tablets (40 mg total) by mouth daily. 05/18/16 05/29/17  Tyson Aliasuncan Thomas Vincent, MD  loperamide (IMODIUM) 2 MG capsule TAKE 1 CAPSULE BY MOUTH DAILY AS NEEDED FOR DIARRHEA OR LOOSE STOOLS 09/11/16   Tyson Aliasuncan Thomas Vincent, MD  metoprolol (LOPRESSOR) 50 MG  tablet TAKE 1& 1/2 TABLETS BY MOUTH TWICE DAILY 03/03/16   Tyson Aliasuncan Thomas Vincent, MD  Potassium Citrate 15 MEQ (1620 MG) TBCR Take 1 tablet by mouth 2 (two) times daily. 10/19/16   Denton Brickiana M Truong, MD  PROAIR HFA 108 [phone](90 Base) MCG/ACT inhaler INHALE 2 PUFFS INTO THE LUNGS EVERY 4 HOURS AS NEEDED FOR WHEEZING OR SHORTNESS OF BREATH 12/06/15   Burns SpainElizabeth A Butcher, MD  warfarin (COUMADIN) 7.5 MG tablet TAKE 1/2 TABLET BY MOUTH ON MONDAY, WEDNESDAY, FRIDAYS. TAKE 1 TABLET BY MOUTH ON SUNDAY, THURSDAY, AND SATURDAY 10/13/16   Tyson Aliasuncan Thomas Vincent, MD    Family History Family History  Problem Relation Age of Onset  . Hyperlipidemia Mother   . Hypertension Mother   . Heart disease Father     had a heart attack at age 74  . Heart attack Father 3852  . Heart disease Brother     at age 74  . Heart attack Brother 50  . Stroke Neg Hx   . Cancer Neg Hx   . Nephrolithiasis Neg Hx     Social History Social History  Substance Use Topics  . Smoking status: Former Smoker    Packs/day: 0.50    Years: 1.00    Types: Cigarettes    Quit date: 08/03/1968  . Smokeless tobacco: Never Used  . Alcohol use No     Allergies   Hydralazine; Lisinopril; Aspirin; and Crab [shellfish allergy]   Review of Systems Review of Systems  A complete 10 system review of systems was obtained and all systems are negative except as noted in the HPI and PMH.   Physical Exam Updated Vital Signs BP (!) 167/81   Pulse 72   Temp 98.2 F (36.8 C) (Oral)   Resp 16   Ht 5\' 4"  (1.626 m)   Wt (!) 308 lb 5 oz (139.8 kg)   LMP 09/23/1986   SpO2 97%   BMI 52.92 kg/m   Physical Exam  Constitutional: She is oriented to person, place, and time. She appears well-developed and well-nourished.  HENT:  Head: Normocephalic.  Eyes: EOM are normal.  Neck: Normal range of motion.  No focal tenderness or spasm of the left neck  Pulmonary/Chest: Effort normal.  Abdominal: She exhibits no distension.  Musculoskeletal: Normal range  of motion.  Neurological: She is alert and oriented to person, place, and time.  Psychiatric: She has a normal mood and affect.  Nursing note and vitals reviewed.    ED Treatments / Results  DIAGNOSTIC STUDIES: Oxygen Saturation is 97% on RA, normal by my interpretation.  COORDINATION OF CARE:  3:04 AM Discussed treatment plan with pt at bedside and pt agreed to plan.  Labs (all labs ordered are listed, but only abnormal results are displayed) Labs Reviewed - No data to display  EKG  EKG  Interpretation None       Radiology No results found.  Procedures Procedures (including critical care time)  Medications Ordered in ED Medications - No data to display   Initial Impression / Assessment and Plan / ED Course  I have reviewed the triage vital signs and the nursing notes.  Pertinent labs & imaging results that were available during my care of the patient were reviewed by me and considered in my medical decision making (see chart for details).     Patient is overall well-appearing.  No longer having pain.  Likely represents sternocleidomastoid spasm.  No bruit noted.  Anterior neck and lateral neck is soft and supple.  No crepitus.  Final Clinical Impressions(s) / ED Diagnoses   Final diagnoses:  Neck pain on left side   I personally performed the services described in this documentation, which was scribed in my presence. The recorded information has been reviewed and is accurate.     New Prescriptions New Prescriptions   No medications on file     Tina Bilis, MD 10/24/16 [phone]

## 2016-10-24 NOTE — ED Notes (Signed)
Pt stable, ambulatory, states understanding of discharge instructions 

## 2016-10-26 ENCOUNTER — Ambulatory Visit (INDEPENDENT_AMBULATORY_CARE_PROVIDER_SITE_OTHER): Payer: Medicare Other | Admitting: Pharmacist

## 2016-10-26 DIAGNOSIS — Z7901 Long term (current) use of anticoagulants: Secondary | ICD-10-CM | POA: Diagnosis not present

## 2016-10-26 DIAGNOSIS — Z86711 Personal history of pulmonary embolism: Secondary | ICD-10-CM

## 2016-10-26 LAB — POCT INR: INR: 1.9

## 2016-10-26 NOTE — Progress Notes (Signed)
Anti-Coagulation Progress Note  [name] is a 76 y.o. female who is currently on an anti-coagulation regimen.    RECENT RESULTS: Recent results are below, the most recent result is correlated with a dose of 45 mg. per week: Lab Results  Component Value Date   INR 1.90 10/26/2016   INR 1.4 10/23/2016   INR 1.0 10/19/2016    ANTI-COAG DOSE: Anticoagulation Dose Instructions as of 10/26/2016      Glynis SmilesSun Mon Tue Wed Thu Fri Sat   New Dose 7.5 mg 7.5 mg 7.5 mg 7.5 mg 3.75 mg 7.5 mg 3.75 mg    Description   Take 1 tablet by mouth once-daily at [facility]6PM daily--except on Saturdays. On Saturdays, take only 1/2 tablet of your 7.5mg  yellow warfarin tablet. Continue self-injecting your low-molecular-weight heparin (enoxaparin/Lovenox) shots for Monday and Tuesday--March 26, 27 2018--then discontinue your shots.       ANTICOAG SUMMARY: Anticoagulation Episode Summary    Current INR goal:   2.0-3.0  TTR:   76.8 % (11.1 mo)  Next INR check:   11/09/2016  INR from last check:     Weekly max dose:     Target end date:   Indefinite  [facility] check [facility]   [facility]  Preferred lab:     Send INR reminders to:      Indications   History of pulmonary embolism [Z86.711]       Comments:   Per discharge summary for her acute PE, indicates 3 months warfarin, then switch to rivaroxaban for indeterminate length of therapy in setting of recurrence of VTE        ANTICOAG TODAY: Anticoagulation Summary  As of 10/26/2016   INR goal:   2.0-3.0  TTR:     Today's INR:     Next INR check:   11/09/2016  Target end date:   Indefinite   Indications   History of pulmonary embolism [Z86.711]        Anticoagulation Episode Summary    INR check [facility]   [facility]   Preferred lab:      Send INR reminders to:      Comments:   Per discharge summary for her acute PE, indicates 3 months warfarin, then switch to rivaroxaban for indeterminate length of therapy in setting of recurrence of VTE       PATIENT INSTRUCTIONS: Patient Instructions  Patient instructed to take medications as defined in the Anti-coagulation Track section of this encounter.  Patient instructed to take today's dose.  Patient instructed to take 1 tablet by mouth once-daily at [facility]6PM daily--except on Saturdays. On Saturdays, take only 1/2 tablet of your 7.5mg  yellow warfarin tablet. Continue self-injecting your low-molecular-weight heparin (enoxaparin/Lovenox) shots for Monday and Tuesday--March 26, 27 2018--then discontinue your shots.  Patient verbalized understanding of these instructions.       FOLLOW-UP Return in 2 weeks (on 11/09/2016) for Follow up INR at 0900h.  Hulen LusterJames Aleighna Wojtas, III Pharm.D., CACP

## 2016-10-26 NOTE — Progress Notes (Signed)
INTERNAL MEDICINE TEACHING ATTENDING ADDENDUM - Gust RungErik C Jacci Ruberg, DO Duration- indefinate, Indication- Hx PE, INR-  Lab Results  Component Value Date   INR 1.90 10/26/2016  . Agree with pharmacy recommendations as outlined in their note.

## 2016-10-26 NOTE — Patient Instructions (Addendum)
Patient instructed to take medications as defined in the Anti-coagulation Track section of this encounter.  Patient instructed to take today's dose.  Patient instructed to take 1 tablet by mouth once-daily at [facility]6PM daily--except on Saturdays. On Saturdays, take only 1/2 tablet of your 7.5mg  yellow warfarin tablet. Continue self-injecting your low-molecular-weight heparin (enoxaparin/Lovenox) shots for Monday and Tuesday--March 26, 27 2018--then discontinue your shots.  Patient verbalized understanding of these instructions.

## 2016-11-09 ENCOUNTER — Ambulatory Visit (INDEPENDENT_AMBULATORY_CARE_PROVIDER_SITE_OTHER): Payer: Medicare Other | Admitting: Pharmacist

## 2016-11-09 DIAGNOSIS — Z7901 Long term (current) use of anticoagulants: Secondary | ICD-10-CM

## 2016-11-09 DIAGNOSIS — Z86711 Personal history of pulmonary embolism: Secondary | ICD-10-CM

## 2016-11-09 LAB — POCT INR: INR: 3

## 2016-11-09 NOTE — Progress Notes (Signed)
Indication: Recurrent venous thromboembolism. Duration: Indefinite. INR: At target. Agree with Dr. Groce's assessment and plan. 

## 2016-11-09 NOTE — Progress Notes (Signed)
Anti-Coagulation Progress Note  Tina Chase is a 76 y.o. female who is currently on an anti-coagulation regimen.    RECENT RESULTS: Recent results are below, the most recent result is correlated with a dose of 45 mg. per week: Lab Results  Component Value Date   INR 3.0 11/09/2016   INR 1.90 10/26/2016   INR 1.4 10/23/2016    ANTI-COAG DOSE: Anticoagulation Dose Instructions as of 11/09/2016      [name] Tue Wed Thu Fri Sat   New Dose 7.5 mg 7.5 mg 7.5 mg 7.5 mg 3.75 mg 7.5 mg 3.75 mg    Description   Take one (1) of your 7.5mg  yellow warfarin tablets by mouth once-daily at [facility] on all days of week--EXCEPT on Mondays and Thursdays--take only one--half (1/2) of your 7.5mg  yellow warfarin tablets on Mondays and Thursdays.       ANTICOAG SUMMARY: Anticoagulation Episode Summary    Current INR goal:   2.0-3.0  TTR:   77.4 % (11.6 mo)  Next INR check:   12/07/2016  INR from last check:   3.0 (11/09/2016)  Weekly max dose:     Target end date:   Indefinite  [facility] check [facility]   [facility]  Preferred lab:     Send INR reminders to:      Indications   History of pulmonary embolism [Z86.711]       Comments:   Per discharge summary for her acute PE, indicates 3 months warfarin, then switch to rivaroxaban for indeterminate length of therapy in setting of recurrence of VTE        ANTICOAG TODAY: Anticoagulation Summary  As of 11/09/2016   INR goal:   2.0-3.0  TTR:     Today's INR:   3.0  Next INR check:   12/07/2016  Target end date:   Indefinite   Indications   History of pulmonary embolism [Z86.711]        Anticoagulation Episode Summary    INR check location[facility]   [facility]   Preferred lab:      Send INR reminders to:      Comments:   Per discharge summary for her acute PE, indicates 3 months warfarin, then switch to rivaroxaban for indeterminate length of therapy in setting of recurrence of VTE      PATIENT INSTRUCTIONS: Patient Instructions  Patient  instructed to take medications as defined in the Anti-coagulation Track section of this encounter.  Patient instructed to take today's dose.  Patient instructed to take one (1) of your 7.5mg  yellow warfarin tablets by mouth once-daily at [facility] on all days of week--EXCEPT on Mondays and Thursdays--take only one--half (1/2) of your 7.5mg  yellow warfarin tablets on Mondays and Thursdays.  Patient verbalized understanding of these instructions.       FOLLOW-UP Return in 4 weeks (on 12/07/2016) for Follow up INR at 0900h.  Hulen Luster, III Pharm.D., CACP

## 2016-11-09 NOTE — Patient Instructions (Signed)
Patient instructed to take medications as defined in the Anti-coagulation Track section of this encounter.  Patient instructed to take today's dose.  Patient instructed to take one (1) of your 7.5mg  yellow warfarin tablets by mouth once-daily at [facility] on all days of week--EXCEPT on Mondays and Thursdays--take only one--half (1/2) of your 7.5mg  yellow warfarin tablets on Mondays and Thursdays.  Patient verbalized understanding of these instructions.

## 2016-11-10 ENCOUNTER — Other Ambulatory Visit: Payer: Self-pay | Admitting: Student in an Organized Health Care Education/Training Program

## 2016-11-12 DIAGNOSIS — J9601 Acute respiratory failure with hypoxia: Secondary | ICD-10-CM | POA: Diagnosis not present

## 2016-11-17 DIAGNOSIS — G4733 Obstructive sleep apnea (adult) (pediatric): Secondary | ICD-10-CM | POA: Diagnosis not present

## 2016-11-17 DIAGNOSIS — T8189XA Other complications of procedures, not elsewhere classified, initial encounter: Secondary | ICD-10-CM | POA: Diagnosis not present

## 2016-11-19 ENCOUNTER — Other Ambulatory Visit: Payer: Self-pay | Admitting: Student in an Organized Health Care Education/Training Program

## 2016-11-19 DIAGNOSIS — I5033 Acute on chronic diastolic (congestive) heart failure: Secondary | ICD-10-CM

## 2016-11-19 DIAGNOSIS — R600 Localized edema: Secondary | ICD-10-CM

## 2016-11-19 NOTE — Telephone Encounter (Signed)
It looks like the lasix is to be dosed daily at 40 mg daily for lower extremity edema and this is a long term medication.  Apparently, she has been getting two 20 mg tablets daily.  I looked up her pharmacy drug list and the cost of a 40 mg tablet is the same as the cost of a 20 mg tablet.  I tried calling her to inform her that the tablet dose was to be changed and she should only take 1 tablet rather than 2 when she got the new prescription.  I received an unidentified voice mail and therefore did not leave a message.  On the prescription instructions I wrote for her to take only 1 tablet daily given the dose change.

## 2016-11-30 ENCOUNTER — Ambulatory Visit (INDEPENDENT_AMBULATORY_CARE_PROVIDER_SITE_OTHER): Payer: Medicare Other | Admitting: Student in an Organized Health Care Education/Training Program

## 2016-11-30 ENCOUNTER — Ambulatory Visit (INDEPENDENT_AMBULATORY_CARE_PROVIDER_SITE_OTHER): Payer: Medicare Other | Admitting: Pharmacist

## 2016-11-30 ENCOUNTER — Encounter: Payer: Self-pay | Admitting: Student in an Organized Health Care Education/Training Program

## 2016-11-30 ENCOUNTER — Encounter (INDEPENDENT_AMBULATORY_CARE_PROVIDER_SITE_OTHER): Payer: Self-pay

## 2016-11-30 VITALS — BP 144/69 | HR 72 | Temp 97.8°F | Ht 64.0 in | Wt 310.7 lb

## 2016-11-30 DIAGNOSIS — I10 Essential (primary) hypertension: Secondary | ICD-10-CM | POA: Diagnosis not present

## 2016-11-30 DIAGNOSIS — Z86711 Personal history of pulmonary embolism: Secondary | ICD-10-CM

## 2016-11-30 DIAGNOSIS — G4733 Obstructive sleep apnea (adult) (pediatric): Secondary | ICD-10-CM

## 2016-11-30 DIAGNOSIS — Z87891 Personal history of nicotine dependence: Secondary | ICD-10-CM | POA: Diagnosis not present

## 2016-11-30 DIAGNOSIS — R600 Localized edema: Secondary | ICD-10-CM | POA: Diagnosis not present

## 2016-11-30 DIAGNOSIS — G4730 Sleep apnea, unspecified: Secondary | ICD-10-CM

## 2016-11-30 DIAGNOSIS — Z7901 Long term (current) use of anticoagulants: Secondary | ICD-10-CM

## 2016-11-30 LAB — POCT INR: INR: 3.3

## 2016-11-30 NOTE — Progress Notes (Signed)
Assessment and Plan:  See Encounters tab for problem-based medical decision making.   __________________________________________________________  HPI:  76 year old woman here for follow-up of hypertension. Patient reports doing fairly well at home. She had a recent workup for an episode of gross hematuria. The hematuria resolved spontaneously when she held Coumadin therapy. CT and cystoscopy were reassuring. She's been back on Coumadin therapy with slightly higher than goal INRs for the last 1 month with no return of bleeding. She reports doing fairly well at home. Fatigue is much improved lately. She's having difficulty tolerating her CPAP machine because of the fit of the mask. She reports good compliance with her other medications and denies side effects. She is interested in visiting her daughter in [place] sometime this summer. Denies any recent fevers or chills. No hospitalizations or ED visits. No chest pain, orthopnea, PND. Swelling in her lower extremities is stable.  __________________________________________________________  Problem List: Patient Active Problem List   Diagnosis Date Noted  . History of pulmonary embolism     Priority: High  . Sleep apnea 07/08/2015    Priority: High  . Bilateral lower extremity edema 01/09/2014    Priority: Medium  . Hyperuricemia 05/25/2013    Priority: Medium  . Nephrolithiasis 05/25/2013    Priority: Medium  . Hypertension 12/22/2011    Priority: Medium  . Obesity, Class III, BMI 40-49.9 (morbid obesity) (HCC) 12/22/2011    Priority: Medium  . Asthma 12/04/2011    Priority: Low  . 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 Routine health maintenance 09/15/2011    Priority: Low    Medications: Reconciled today in Epic __________________________________________________________  Physical Exam:  Vital Signs: Vitals:   11/30/16 1152 11/30/16 1512  BP: (!) 157/81 (!) 144/69  Pulse: 68 72  Temp: 97.8 F (36.6 C)   TempSrc:  Oral   SpO2: 94%   Weight: (!) 310 lb 11.2 oz (140.9 kg)   Height:  (1.626 m)     Gen: Well appearing, NAD CV: RRR, no murmurs Pulm: Normal effort, CTA throughout, no wheezing Abd: Soft, NT, ND, normal BS.  Ext: Warm, large lipedema bilaterally, normal joints Skin: No atypical appearing moles. No rashes

## 2016-11-30 NOTE — Assessment & Plan Note (Addendum)
Patient with large unprovoked pulmonary embolism 1 year ago, has done well with Coumadin anticoagulation since that time. She had about a 2 month interruption and anticoagulation while she was being evaluated for gross hematuria. CT without contrast and cystoscopy were reassuring and unrevealing of a cause. Likely spontaneous bleed in the setting of Coumadin therapy. Urology recommended that if she has recurrent gross hematuria contrasted CT scan. I advised we continue anticoagulation with Coumadin therapy. She has been if she was able to take a long plane trip to visit family in [place], which I told her it was okay as long as she stable on her Coumadin dosing at the time. INR was checked today and a little high at 3.3. Will follow up with pharmacy for dose adjustments.

## 2016-11-30 NOTE — Assessment & Plan Note (Signed)
Diagnosed on polysomnography and started on CPAP this year. Having difficulty with a comfortable fit of her mask. Has been off CPAP for several weeks. I encouraged her to call her CPAP supply company to try to find a nasal mask which I think would be appropriate for her. I encouraged her to get back in CPAP therapy as soon as she can as I think it will help with her weight loss efforts as well as difficult to control hypertension.

## 2016-11-30 NOTE — Patient Instructions (Addendum)
1. We will check your INR today. You may go on a plane trip to [place], just be sure that we have checked your INR a week before you leave.   2. Continue your blood pressure medications as you are doing.   3. Call your CPAP company to get a mask that fits you appropriately.   4. Please check your blood pressure at home so we can have more readings to make decisions about your medicines.

## 2016-11-30 NOTE — Patient Instructions (Signed)
Patient educated about medication as defined in this encounter and verbalized understanding by repeating back instructions provided.   

## 2016-11-30 NOTE — Assessment & Plan Note (Addendum)
Show any edema is stable on current dosing of furosemide. Plan to continue with furosemide. I encouraged compression stockings as much as she can.

## 2016-11-30 NOTE — Progress Notes (Signed)
Anticoagulation Management Tina Chase is a 76 y.o. female who reports to the clinic for monitoring of warfarin treatment.    Indication: PE history, large clot burden Duration: indefinite Supervising physician: Tina Chase  Anticoagulation Clinic Visit History: Patient does not report signs/symptoms of bleeding or thromboembolism Anticoagulation Episode Summary    Current INR goal:   2.0-3.0  TTR:   72.9 % (1 y)  Next INR check:   12/21/2016  INR from last check:   3.3! (11/30/2016)  Weekly max dose:     Target end date:   Indefinite  INR check location[facility]   [facility]  Preferred lab:     Send INR reminders to:      Indications   History of pulmonary embolism [Z86.711]       Comments:   Per discharge summary for her acute PE, indicates 3 months warfarin, then switch to rivaroxaban for indeterminate length of therapy in setting of recurrence of VTE       ASSESSMENT Recent Results: The most recent result is correlated with 45 mg per week: Lab Results  Component Value Date   INR 3.3 11/30/2016   INR 3.0 11/09/2016   INR 1.90 10/26/2016   Anticoagulation Dosing: INR as of 11/30/2016 and Previous Dosing Information    INR Dt INR Goal Tina Chase Tina Chase Tina Chase Tina Chase Tina Chase Tina Chase Tina Chase Tina Chase   11/30/2016 3.3 2.0-3.0 45 mg 7.5 mg 7.5 mg 7.5 mg 7.5 mg 3.75 mg 7.5 mg 3.75 mg    Previous description   Take one (1) of your 7.5mg  yellow warfarin tablets by mouth once-daily at 6PM on all days of week--EXCEPT on Mondays and Thursdays--take only one--half (1/2) of your 7.5mg  yellow warfarin tablets on Mondays and Thursdays.    Anticoagulation Dose Instructions as of 11/30/2016      Total Tina Chase Tina Chase Tina Chase Tina Chase Tina Chase Tina Chase Tina Chase   New Dose 41.25 mg 7.5 mg 3.75 mg 7.5 mg 3.75 mg 7.5 mg 3.75 mg 7.5 mg     (7.5 mg x 1)  (7.5 mg x 0.5)  (7.5 mg x 1)  (7.5 mg x 0.5)  (7.5 mg x 1)  (7.5 mg x 0.5)  (7.5 mg x 1)                           INR today: Supratherapeutic  PLAN Weekly dose was decreased by 8%  to 41.25 mg per week  Patient Instructions  Patient educated about medication as defined in this encounter and verbalized understanding by repeating back instructions provided.   Patient advised to contact clinic or seek medical attention if signs/symptoms of bleeding or thromboembolism occur.  Patient verbalized understanding by repeating back information and was advised to contact me if further medication-related questions arise. Patient was also provided an information handout.  Follow-up Return in about 3 weeks (around 12/21/2016).  Tina Chase

## 2016-11-30 NOTE — Assessment & Plan Note (Signed)
Blood pressure has been difficult to control in the past. 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 She checks home blood pressure readings and says the systolics are frequently less than 130. Says BP yesterday at home was 106/70. We are limited on antihypertensive agents. 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 I think her blood pressure probably is mostly controlled outside of the clinic setting. I asked her to bring her cuff with her next visit so we can correlate with our machines. Plan to continue amlodipine 10 mg and metoprolol 75 mg twice a day.

## 2016-12-07 ENCOUNTER — Other Ambulatory Visit: Payer: Self-pay | Admitting: Student in an Organized Health Care Education/Training Program

## 2016-12-07 ENCOUNTER — Ambulatory Visit: Payer: Medicare Other

## 2016-12-07 DIAGNOSIS — K432 Incisional hernia without obstruction or gangrene: Secondary | ICD-10-CM | POA: Diagnosis not present

## 2016-12-10 ENCOUNTER — Other Ambulatory Visit: Payer: Self-pay

## 2016-12-11 MED ORDER — METOPROLOL TARTRATE 50 MG PO TABS: ORAL_TABLET | ORAL | 2 refills | Status: DC

## 2016-12-11 MED ORDER — WARFARIN SODIUM 7.5 MG PO TABS: ORAL_TABLET | ORAL | 0 refills | Status: DC

## 2016-12-12 DIAGNOSIS — J9601 Acute respiratory failure with hypoxia: Secondary | ICD-10-CM | POA: Diagnosis not present

## 2016-12-17 DIAGNOSIS — G4733 Obstructive sleep apnea (adult) (pediatric): Secondary | ICD-10-CM | POA: Diagnosis not present

## 2016-12-17 DIAGNOSIS — T8189XA Other complications of procedures, not elsewhere classified, initial encounter: Secondary | ICD-10-CM | POA: Diagnosis not present

## 2016-12-21 ENCOUNTER — Ambulatory Visit (INDEPENDENT_AMBULATORY_CARE_PROVIDER_SITE_OTHER): Payer: Medicare Other | Admitting: Pharmacist

## 2016-12-21 DIAGNOSIS — Z7901 Long term (current) use of anticoagulants: Secondary | ICD-10-CM

## 2016-12-21 DIAGNOSIS — Z86711 Personal history of pulmonary embolism: Secondary | ICD-10-CM

## 2016-12-21 LAB — POCT INR: INR: 2.4

## 2016-12-21 NOTE — Patient Instructions (Signed)
Patient instructed to take medications as defined in the Anti-coagulation Track section of this encounter.  Patient instructed to take today's dose.  Patient instructed to take 1/2 tablet of your 7.5mg  yellow-warfarin tablets--by mouth, once-daily at 6PM on MONDAYS, WEDNESDAYS and FRIDAYS. ALL other days, take 1 tablet. Patient verbalized understanding of these instructions.

## 2016-12-21 NOTE — Progress Notes (Signed)
I reviewed Dr. Saralyn PilarGroce's.  Patient is on coumadin for recurrent VTE.  INR was 2.4

## 2016-12-21 NOTE — Progress Notes (Signed)
Anti-Coagulation Progress Note  Tina Chase is a 76 y.o. female who is currently on an anti-coagulation regimen for indefinite oral anticoagulation therapy with warfarin in setting of recurrent disease with history of pulmonary embolism [Z86.71]. Noted in previous discharge summary and captured as comments within her anticoagulation template is "...indicates 3 months warfarin, then switch to DOAC for indeterminate length of therapy in setting of recurrence of VTE". Patient has cited difficulty to "access" to DOACs based upon costs. This can be re-evaluated annually when her re-assessment for continued warfarin therapy weighing risks vs. Benefits is held annually to determine whether "with the advice and consent of the patient"--that therapy is indeed continued.     RECENT RESULTS: Recent results are below, the most recent result is correlated with a dose of 41.25 mg. per week: Lab Results  Component Value Date   INR 2.40 12/21/2016   INR 3.3 11/30/2016   INR 3.0 11/09/2016    ANTI-COAG DOSE: Anticoagulation Warfarin Dose Instructions as of 12/21/2016      Glynis SmilesSun Mon Tue Wed Thu Fri Sat   New Dose 7.5 mg 3.75 mg 7.5 mg 3.75 mg 7.5 mg 3.75 mg 7.5 mg       ANTICOAG SUMMARY: Anticoagulation Episode Summary    Current INR goal:   2.0-3.0  TTR:   72.6 % (1.1 y)  Next INR check:   01/18/2017  INR from last check:   2.40 (12/21/2016)  Weekly max warfarin dose:     Target end date:   Indefinite  INR check [facility]   [facility]  Preferred lab:     Send INR reminders to:      Indications   History of pulmonary embolism [Z86.711]       Comments:   Per discharge summary for her acute PE, indicates 3 months warfarin, then switch to rivaroxaban for indeterminate length of therapy in setting of recurrence of VTE        ANTICOAG TODAY: Anticoagulation Summary  As of 12/21/2016   INR goal:   2.0-3.0  TTR:     Today's INR:   2.40  Next INR check:   01/18/2017  Target end date:    Indefinite   Indications   History of pulmonary embolism [Z86.711]        Anticoagulation Episode Summary    INR check [facility]   [facility]   Preferred lab:      Send INR reminders to:      Comments:   Per discharge summary for her acute PE, indicates 3 months warfarin, then switch to rivaroxaban for indeterminate length of therapy in setting of recurrence of VTE      PATIENT INSTRUCTIONS: Patient Instructions  Patient instructed to take medications as defined in the Anti-coagulation Track section of this encounter.  Patient instructed to take today's dose.  Patient instructed to take 1/2 tablet of your 7.5mg  yellow-warfarin tablets--by mouth, once-daily at 6PM on MONDAYS, WEDNESDAYS and FRIDAYS. ALL other days, take 1 tablet. Patient verbalized understanding of these instructions.       FOLLOW-UP Return in 4 weeks (on 01/18/2017) for Follow up INR at 0930h, Follow up INR.  Hulen LusterJames Groce, III Pharm.D., CACP

## 2016-12-27 ENCOUNTER — Other Ambulatory Visit: Payer: Self-pay | Admitting: Student in an Organized Health Care Education/Training Program

## 2017-01-04 ENCOUNTER — Other Ambulatory Visit: Payer: Self-pay | Admitting: Student in an Organized Health Care Education/Training Program

## 2017-01-12 DIAGNOSIS — J9601 Acute respiratory failure with hypoxia: Secondary | ICD-10-CM | POA: Diagnosis not present

## 2017-01-17 DIAGNOSIS — T8189XA Other complications of procedures, not elsewhere classified, initial encounter: Secondary | ICD-10-CM | POA: Diagnosis not present

## 2017-01-17 DIAGNOSIS — G4733 Obstructive sleep apnea (adult) (pediatric): Secondary | ICD-10-CM | POA: Diagnosis not present

## 2017-01-18 ENCOUNTER — Ambulatory Visit (INDEPENDENT_AMBULATORY_CARE_PROVIDER_SITE_OTHER): Payer: Medicare Other | Admitting: Pharmacist

## 2017-01-18 DIAGNOSIS — Z86711 Personal history of pulmonary embolism: Secondary | ICD-10-CM

## 2017-01-18 DIAGNOSIS — Z7901 Long term (current) use of anticoagulants: Secondary | ICD-10-CM

## 2017-01-18 LAB — POCT INR: INR: 2.4

## 2017-01-18 MED ORDER — WARFARIN SODIUM 7.5 MG PO TABS: ORAL_TABLET | ORAL | 2 refills | Status: DC

## 2017-01-18 NOTE — Addendum Note (Signed)
Addended by: Hulen LusterGROCE, JAMES B on: 01/18/2017 09:50 AM   Modules accepted: Orders

## 2017-01-18 NOTE — Progress Notes (Signed)
Anticoagulation Management Tina BlendRebecca J Chase is a 76 y.o. female who reports to the clinic for monitoring of warfarin treatment.    Indication:  History of PE Duration: indefinite Supervising physician: Debe CoderEmily Chase  Anticoagulation Clinic Visit History: Patient does not report signs/symptoms of bleeding or thromboembolism  Other recent changes: None Anticoagulation Episode Summary    Current INR goal:   2.0-3.0  TTR:   74.4 % (1.1 y)  Next INR check:   02/15/2017  INR from last check:   2.40 (01/18/2017)  Weekly max warfarin dose:     Target end date:   Indefinite  INR check location[facility]   [facility]  Preferred lab:     Send INR reminders to:      Indications   History of pulmonary embolism [Z86.711]       Comments:   Per discharge summary for her acute PE, indicates 3 months warfarin, then switch to rivaroxaban for indeterminate length of therapy in setting of recurrence of VTE       ASSESSMENT Recent Results: The most recent result is correlated with 41.25 mg per week: Lab Results  Component Value Date   INR 2.40 01/18/2017   INR 2.40 12/21/2016   INR 3.3 11/30/2016    Anticoagulation Dosing: INR as of 01/18/2017 and Previous Warfarin Dosing Information    INR Dt [facility] Goal [facility] [name]   01/18/2017 2.40 2.0-3.0 41.25 mg 7.5 mg 3.75 mg 7.5 mg 3.75 mg 7.5 mg 3.75 mg 7.5 mg    Anticoagulation Warfarin Dose Instructions as of 01/18/2017      Total [name]   New Dose 41.25 mg 7.5 mg 3.75 mg 7.5 mg 3.75 mg 7.5 mg 3.75 mg 7.5 mg     (7.5 mg x 1)  (7.5 mg x 0.5)  (7.5 mg x 1)  (7.5 mg x 0.5)  (7.5 mg x 1)  (7.5 mg x 0.5)  (7.5 mg x 1)                           INR today: Therapeutic  PLAN Weekly dose was unchanged   Patient Instructions  Patient instructed to take medications as defined in the Anti-coagulation Track section of this encounter.  Patient instructed to take today's dose.  Patient instructed to take 1  tablet of her 7.5mg  yellow warfarin tablets on Sundays, Tuesdays, Thursdays and Saturdays; on Mondays, Wednesdays and Fridays--she will take one-half (1/2) tablet of her yellow 7.5mg  strength warfarin tablets.  Patient verbalized understanding of these instructions.     Patient advised to contact clinic or seek medical attention if signs/symptoms of bleeding or thromboembolism occur.  Patient verbalized understanding by repeating back information and was advised to contact me if further medication-related questions arise. Patient was also provided an information handout.  Follow-up Return in about 4 weeks (around 02/15/2017) for Follow up INR at 0900h.  Gar PontoGroce III, Norvel Wenker Boyd PharmD, CACP  15 minutes spent face-to-face with the patient during the encounter. 50% of time spent on education. 50% of time was spent on point of care sample collection, processing and interpretation and data entry.

## 2017-01-18 NOTE — Patient Instructions (Signed)
Patient instructed to take medications as defined in the Anti-coagulation Track section of this encounter.  Patient instructed to take today's dose.  Patient instructed to take 1 tablet of her 7.5mg  yellow warfarin tablets on Sundays, Tuesdays, Thursdays and Saturdays; on Mondays, Wednesdays and Fridays--she will take one-half (1/2) tablet of her yellow 7.5mg  strength warfarin tablets.  Patient verbalized understanding of these instructions.

## 2017-01-19 NOTE — Progress Notes (Signed)
I have reviewed Dr. Saralyn PilarGroce's note.  [facility].  Plan for change to xarelto after 3 months of treatment with coumadin.  She was admitted for PE on 11/05/16.  Her INR was therapeutic.  F/U in 4 weeks per Dr. Saralyn PilarGroce's note.

## 2017-01-22 ENCOUNTER — Other Ambulatory Visit: Payer: Self-pay | Admitting: Student in an Organized Health Care Education/Training Program

## 2017-02-01 ENCOUNTER — Encounter: Payer: Self-pay | Admitting: Student in an Organized Health Care Education/Training Program

## 2017-02-01 ENCOUNTER — Ambulatory Visit (INDEPENDENT_AMBULATORY_CARE_PROVIDER_SITE_OTHER): Payer: Medicare Other | Admitting: Student in an Organized Health Care Education/Training Program

## 2017-02-01 VITALS — BP 154/88 | HR 79 | Temp 97.8°F | Ht 64.0 in | Wt 314.3 lb

## 2017-02-01 DIAGNOSIS — Z87891 Personal history of nicotine dependence: Secondary | ICD-10-CM | POA: Diagnosis not present

## 2017-02-01 DIAGNOSIS — K625 Hemorrhage of anus and rectum: Secondary | ICD-10-CM | POA: Diagnosis not present

## 2017-02-01 DIAGNOSIS — Z7901 Long term (current) use of anticoagulants: Secondary | ICD-10-CM

## 2017-02-01 DIAGNOSIS — Z79899 Other long term (current) drug therapy: Secondary | ICD-10-CM

## 2017-02-01 DIAGNOSIS — I10 Essential (primary) hypertension: Secondary | ICD-10-CM

## 2017-02-01 DIAGNOSIS — Z86711 Personal history of pulmonary embolism: Secondary | ICD-10-CM

## 2017-02-01 LAB — POCT INR: INR: 2.5

## 2017-02-01 MED ORDER — METOPROLOL TARTRATE 100 MG PO TABS: 100.0000 mg | ORAL_TABLET | Freq: Two times a day (BID) | ORAL | 3 refills | Status: DC

## 2017-02-01 MED ORDER — CARVEDILOL 12.5 MG PO TABS: 12.5000 mg | ORAL_TABLET | Freq: Two times a day (BID) | ORAL | 5 refills | Status: DC

## 2017-02-01 NOTE — Progress Notes (Signed)
   Assessment and Plan:  See Encounters tab for problem-based medical decision making.   __________________________________________________________  HPI:  [age] year old woman here for an acute visit today because of bright red blood per rectum. Noticed it yesterday after having a bowel movement. No blood with wiping. No rectal lower lower abdominal pain. Says the toilet bowl was filled with blood. Had soft bowel movement as well. Said no further bleeding since that time. No nausea or vomiting. No abdominal pain. Otherwise been in usual state of health. No recent fevers or chills. No chest pain, shortness of breath, dyspnea with exertion, or angina. Reports good compliance with her medications. She is on anticoagulation for a unprovoked pulmonary embolism in 2017. She typically has a good range of her INR, [facility] She did not take her Coumadin last night after having this one bloody episode. No aspirin or antiplatelets. Otherwise has been doing well at home. No recent hospitalizations.  __________________________________________________________  Problem List: Patient Active Problem List   Diagnosis Date Noted  . Rectal bleeding 02/01/2017    Priority: High  . History of pulmonary embolism     Priority: High  . Sleep apnea 07/08/2015    Priority: High  . Bilateral lower extremity edema 01/09/2014    Priority: Medium  . Hyperuricemia 05/25/2013    Priority: Medium  . Nephrolithiasis 05/25/2013    Priority: Medium  . Hypertension 12/22/2011    Priority: Medium  . Obesity, Class III, BMI 40-49.9 (morbid obesity) (HCC) 12/22/2011    Priority: Medium  . Asthma 12/04/2011    Priority: Low  . 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 Routine health maintenance 09/15/2011    Priority: Low    Medications: Reconciled today in Epic __________________________________________________________  Physical Exam:  Vital Signs: Vitals:   02/01/17 1039  BP: (!)  154/88  Pulse: 79  Temp: 97.8 F (36.6 C)  TempSrc: Oral  SpO2: 94%  Weight: (!) 314 lb 4.8 oz (142.6 kg)  Height: 5\' 4"  (1.626 m)    Gen: Well appearing, NAD CV: RRR, no murmurs Pulm: Normal effort, CTA throughout, no wheezing Abd: Soft, NT, ND, normal BS.  Gen: Normal external genitalia, no external hemorrhoids, on speculum exam her vagina appears normal with no lesions, no discharge, cervix is easily seen and is closed with no discharge or lesions.  Ext: Warm, no edema, normal joints Skin: No atypical appearing moles. No rashes

## 2017-02-01 NOTE — Assessment & Plan Note (Addendum)
One episode one day ago of bright red blood per rectum during a bowel movement, this was painless. I think this is most likely coming from diverticular disease. On vaginal exam today there was no signs of vaginal bleeding. She has no external hemorrhoids, possible she has an internal hemorrhoid but it was not seen on last colonoscopy. We will check a CBC today. As long as she does not have a new blood loss anemia, I think we can watch this and see if it recurs. If it does, we can repeat the colonoscopy and consider stopping anticoagulation, but I advised her that we try to keep going with the anticoagulation for as long as possible.

## 2017-02-01 NOTE — Assessment & Plan Note (Signed)
Provoked pulmonary embolism in 2013, anticoagulant for a short time. Then she had a large unprovoked splinter embolism in 2017 which was nearly a saddle pulmonary embolus. She's been on anticoagulation with Coumadin since that time, not a DOAC candidate because of obesity. She has had some difficulty with bleeding complications, she had intermittent gross hematuria. After urology investigation she was restarted on Coumadin. Now she's had one episode of bright red blood per rectum. Patient seems to be very short on patience for continuing Coumadin. I encouraged her that all of these episodes were minor bleeding and she's had no major complications yet. Given the size of the unprovoked PE I think there is a lot of benefit with continuing anticoagulation for as long as possible in her case. We checked upon care INR today which was 2.5, plan to continue Coumadin at current dosing and [facility].

## 2017-02-01 NOTE — Assessment & Plan Note (Signed)
The pressure is elevated above goal today despite good compliance with antihypertensives. She has a complicated history of antihypertensive agent side effects. We are very limited on options. She has angioedema with ACE inhibitor, anaphylaxis to hydralazine, and he cannot do thiazide diuretics because of uric acid nephrolithiasis. Plan is to continue amlodipine 10mg  daily, and switch metoprolol to carvedilol 12.5 mg bid which I hope will have better antihypertensive effect. She will check home blood pressure readings and let me know how she is doing.

## 2017-02-01 NOTE — Patient Instructions (Addendum)
1. I think your bleeding is coming from a colon diverticula. These are very common. If the bleeding comes back, we can arrange for you to have a colonoscopy, rather than waiting the usual 10 years.   2. Please keep taking the coumadin for now. I think there is a lot of benefit with this medicine in preventing blood clots for you.   3. I have changed your metoprolol to carvedilol, which is going to help with your high blood pressure a bit more.

## 2017-02-02 ENCOUNTER — Telehealth: Payer: Self-pay | Admitting: Student in an Organized Health Care Education/Training Program

## 2017-02-02 LAB — CBC
Hematocrit: 41.4 % (ref 34.0–46.6)
Hemoglobin: 13.4 g/dL (ref 11.1–15.9)
MCH: 30.5 pg (ref 26.6–33.0)
MCHC: 32.4 g/dL (ref 31.5–35.7)
MCV: 94 fL (ref 79–97)
PLATELETS: 246 10*3/uL (ref 150–379)
RBC: 4.4 x10E6/uL (ref 3.77–5.28)
RDW: 14.6 % (ref 12.3–15.4)
WBC: 6.4 10*3/uL (ref 3.4–10.8)

## 2017-02-02 NOTE — Telephone Encounter (Signed)
I spoke with patient, told her that hgb and INR were both normal. I advised continuing coumadin for now. Call me if she has any further BRBPR and we can arrange for earlier colonoscopy and discuss what to do with the coumadin.

## 2017-02-11 DIAGNOSIS — J9601 Acute respiratory failure with hypoxia: Secondary | ICD-10-CM | POA: Diagnosis not present

## 2017-02-15 ENCOUNTER — Ambulatory Visit (INDEPENDENT_AMBULATORY_CARE_PROVIDER_SITE_OTHER): Payer: Medicare Other | Admitting: Internal Medicine

## 2017-02-15 ENCOUNTER — Ambulatory Visit (INDEPENDENT_AMBULATORY_CARE_PROVIDER_SITE_OTHER): Payer: Medicare Other | Admitting: Pharmacist

## 2017-02-15 VITALS — BP 180/94 | HR 73 | Temp 98.4°F | Wt 313.5 lb

## 2017-02-15 DIAGNOSIS — Z7901 Long term (current) use of anticoagulants: Secondary | ICD-10-CM

## 2017-02-15 DIAGNOSIS — K625 Hemorrhage of anus and rectum: Secondary | ICD-10-CM

## 2017-02-15 DIAGNOSIS — Z86711 Personal history of pulmonary embolism: Secondary | ICD-10-CM | POA: Diagnosis not present

## 2017-02-15 DIAGNOSIS — R600 Localized edema: Secondary | ICD-10-CM | POA: Diagnosis not present

## 2017-02-15 LAB — CBC
HCT: 41.3 % (ref 36.0–46.0)
Hemoglobin: 13.6 g/dL (ref 12.0–15.0)
MCH: 31.1 pg (ref 26.0–34.0)
MCHC: 32.9 g/dL (ref 30.0–36.0)
MCV: 94.3 fL (ref 78.0–100.0)
PLATELETS: 256 10*3/uL (ref 150–400)
RBC: 4.38 MIL/uL (ref 3.87–5.11)
RDW: 14.5 % (ref 11.5–15.5)
WBC: 7.8 10*3/uL (ref 4.0–10.5)

## 2017-02-15 LAB — POCT INR: INR: 1.4

## 2017-02-15 MED ORDER — RIVAROXABAN 10 MG PO TABS: 10.0000 mg | ORAL_TABLET | Freq: Every day | ORAL | 3 refills | Status: DC

## 2017-02-15 MED ORDER — FUROSEMIDE 40 MG PO TABS: 40.0000 mg | ORAL_TABLET | Freq: Every day | ORAL | 3 refills | Status: DC

## 2017-02-15 NOTE — Patient Instructions (Signed)
Patient instructed to take medications as defined in the Anti-coagulation Track section of this encounter.  Patient instructed to OMIT today's dose [facility]. Based upon their findings (History, Physical Exam and repeat CBC) we will make a determination of how (if appropriate) to continue anticoagulation--it may be with rivaroxaban/Xarelto at the TEN (10) milligram once-daily dose.  Patient verbalized understanding of these instructions.

## 2017-02-15 NOTE — Progress Notes (Signed)
Anticoagulation Management Tina Chase is a 76 y.o. female who reports to the clinic for monitoring of warfarin treatment.    Indication: PE   Duration: indefinite Supervising physician: Blanch Media  Anticoagulation Clinic Visit History: Patient does report signs/symptoms of bleeding or thromboembolism (Bleeding--see Patient Findings section of this note for documentation).  Other recent changes: No diet, medications, lifestyle endorsed by the patient.  Anticoagulation Episode Summary    Current INR goal:   2.0-3.0  TTR:   74.3 % (1.2 y)  Next INR check:   02/22/2017  INR from last check:   1.40! (02/15/2017)  Weekly max warfarin dose:     Target end date:   Indefinite  INR check location:   [facility]  Preferred lab:     Send INR reminders to:      Indications   History of pulmonary embolism [Z86.711]       Comments:   Per discharge summary for her acute PE, indicates 3 months warfarin, then switch to rivaroxaban for indeterminate length of therapy in setting of recurrence of VTE       ASSESSMENT Recent Results: The most recent result is correlated with 30 mg per week: (she has omitted doses that otherwise were to have been taken--citing evidence of BRBPR).  Lab Results  Component Value Date   INR 1.40 02/15/2017   INR 2.5 02/01/2017   INR 2.40 01/18/2017    Anticoagulation Dosing: INR as of 02/15/2017 and Previous Warfarin Dosing Information    INR Dt INR Goal [name] [name]   02/15/2017 1.40 2.0-3.0 30 mg 7.5 mg 3.75 mg 7.5 mg 3.75 mg 7.5 mg 0 mg 0 mg   Patient deviated from recommended dosing.       Anticoagulation Warfarin Dose Instructions as of 02/15/2017      Total [name]   New Dose 37.5 mg 3.75 mg 7.5 mg 3.75 mg 7.5 mg 3.75 mg 7.5 mg 3.75 mg     (7.5 mg x 0.5)  (7.5 mg x 1)  (7.5 mg x 0.5)  (7.5 mg x 1)  (7.5 mg x 0.5)  (7.5 mg x 1)  (7.5 mg x 0.5)                         Description   Will make  final determination on warfarin dosing based upon findings of H&Physical exam of the [facility] Physician. If anticoagulation is deemed still appropriate--would suggest rivaroxaban/Xarelto 10 (TEN) mg based upon most recent NEJM citation [employer] Extension Trial) as well as FDA approval based upon these findings--supporting a 50% dosage reduction from 20mg  to 10mg --which demonstrated efficacy and safety for this regimen for secondary prophylaxis against VTE.      INR today: Subtherapeutic  PLAN Weekly dose was decreased by 33% based upon her omitting her warfarin in response to bleeding. Will determine AFTER visit with Physician in [facility] what the plan of action will be for continuing anticoagulation vs. Any acute need for colonoscopy, etc.   Patient Instructions  Patient instructed to take medications as defined in the Anti-coagulation Track section of this encounter.  Patient instructed to OMIT today's dose until seen by the [facility] Physician. Based upon their findings (History, Physical Exam and repeat CBC) we will make a determination of how (if appropriate) to continue anticoagulation--it may be with rivaroxaban/Xarelto at the TEN (10) milligram once-daily dose.  Patient verbalized understanding of  these instructions.     Patient advised to contact clinic or seek medical attention if signs/symptoms of bleeding or thromboembolism occur.  Patient verbalized understanding by repeating back information and was advised to contact me if further medication-related questions arise. Patient was also provided an information handout.  Follow-up Return in 2 weeks (on 03/01/2017) for Follow up INR at 0900h.  Gar PontoGroce III, Loisann Roach Boyd PharmD, CACP, CPP  15 minutes spent face-to-face with the patient during the encounter. 50% of time spent on education. 50% of time was spent on fingerstick point of care INR collection, processing, interpretation and discussion with a faculty attending physician and documentation within  EPIC/CHL and [email or web address].

## 2017-02-15 NOTE — Progress Notes (Signed)
   CC: Blood in stool  HPI:  Tina Chase is a 76 y.o. F with past medical history as detailed below who presents from coumadin clinic with complaints of bright red blood per rectum.   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 It was painless with no associated nausea, vomiting, diarrhea, fever, or other symptoms. Her Hgb was 13.4, INR 2.5. She was instructed to continue her anti-coagulation and monitor for further events.   She reports an additional occurrences of rectal bleeding. She states 4 days ago she had bright red blood when wiping after BM, but none in toilet bowel. This continued over the course of two days and resolved. She stopped taking her warfarin doses after the onset and resumed her regimen yesterday. She also notes generalized sx of fatigue, decreased activity tolerance which are present chronically and have not increased over the last two weeks. She attributes these sx to warfarin because they resolve when she has temporarily stopped taking it. Denies fecal urgency, SOB, CP, fever.   Past Medical History:  Diagnosis Date  . Arthritis    left knee  and left shoulder   . Asthma   . 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 Hyperlipidemia   . Hyperparathyroidism   . Hypertension   . Myocardial infarction (HCC)   . Obesity   . Pulmonary embolism (HCC)    Provoked 2013, Unprovoked 2017  . Pulmonary nodule    12mm, stable 2013 to 2017, benign   Review of Systems:  Review of Systems  Constitutional: Negative for fever.  Eyes: Negative for double vision.  Respiratory: Negative for shortness of breath.   Cardiovascular: Negative for chest pain.  Gastrointestinal: Positive for blood in stool. Negative for abdominal pain and vomiting.  Genitourinary: Negative for hematuria.     Physical Exam:  Vitals:   02/15/17 0932  BP: (!)  180/94  Pulse: 73  SpO2: 97%  Weight: (!) 313 lb 8 oz (142.2 kg)   Physical Exam  Constitutional: She is oriented to person, place, and time. She appears well-developed and well-nourished.  Eyes: Conjunctivae are normal.  Cardiovascular: Normal rate, regular rhythm and intact distal pulses.   Pulmonary/Chest: Effort normal and breath sounds normal.  Abdominal:  Soft, obese abdomen with no tenderness, well-healed surgical scars, normal bowel sounds   Neurological: She is alert and oriented to person, place, and time.  Skin: Skin is warm and dry. Capillary refill takes less than 2 seconds.    Assessment & Plan:   See Encounters Tab for problem based charting.  Patient seen with Dr. Rogelia BogaButcher

## 2017-02-15 NOTE — Assessment & Plan Note (Signed)
Pt requesting refill of her furosemide 40 mg daily, taking for her edema. Recent PCP visit (4/30) notes continuing this as plan for LE edema. Refill provided.

## 2017-02-15 NOTE — Assessment & Plan Note (Addendum)
This is a second occurrence of painless rectal bleeding, though this recent episode was less than the previous. With regard to etiology, a colonoscopy in 2010 showed mild diverticulosis of the sigmoid colon, though internal hemorrhoids are also possible with her presentation. CBC today remained stable with Hgb 13.6. Management of this problem is complicated by her need for anti-coagulation. At this time we feel it is appropriate to remain on anti-coagulation- see Hx of PE A&P for further details. Given the less severe nature of her recent episode, we will hold off on GI referral and reassess at f/u.

## 2017-02-15 NOTE — Patient Instructions (Signed)
Thank you for coming in Ms. Duddy.   Your blood count looks good today. We are going to change your medication from Coumadin to a different medicine called Xarelto. Hopefully you will feel better on this, but you will still be protected from blood clots.   Let us know if you have any other bleeding problems. You can see Dr. Oswaldo DoneVincent in August to see how you are doing with the new medicine.

## 2017-02-15 NOTE — Assessment & Plan Note (Signed)
Her significant pulmonary embolism history dictates her need for long-term anti-coagulation. The patient has been on Coumadin with minor bleeding complications. She also expresses frustration with generalized symptoms she attributes to her coumadin regimen and states she has considered stopping it altogether. In discussion with pharmacy, this patient may benefit from rivaroxaban at a dose of 10 mg, which in a recent study was shown to have similar efficacy and lower bleeding events than higher doses. Her weight has been a hesitation in using a DOAC, however, at this point it may increase pt adherence and be appropriate despite the limited data with use at her BMI.  -DC Coumadin, Start Xarelto 10 mg daily  -F/u in 3-4 weeks with PCP

## 2017-02-16 DIAGNOSIS — T8189XA Other complications of procedures, not elsewhere classified, initial encounter: Secondary | ICD-10-CM | POA: Diagnosis not present

## 2017-02-16 DIAGNOSIS — G4733 Obstructive sleep apnea (adult) (pediatric): Secondary | ICD-10-CM | POA: Diagnosis not present

## 2017-02-16 NOTE — Progress Notes (Signed)
Internal Medicine Clinic Attending  I saw and evaluated the patient.  I personally confirmed the key portions of the history and exam documented by Dr. Harden and I reviewed pertinent patient test results.  The assessment, diagnosis, and plan were formulated together and I agree with the documentation in the resident's note.  

## 2017-02-23 DIAGNOSIS — H2513 Age-related nuclear cataract, bilateral: Secondary | ICD-10-CM | POA: Diagnosis not present

## 2017-02-23 DIAGNOSIS — H43391 Other vitreous opacities, right eye: Secondary | ICD-10-CM | POA: Diagnosis not present

## 2017-03-01 ENCOUNTER — Ambulatory Visit: Payer: Medicare Other

## 2017-03-14 DIAGNOSIS — J9601 Acute respiratory failure with hypoxia: Secondary | ICD-10-CM | POA: Diagnosis not present

## 2017-03-15 ENCOUNTER — Ambulatory Visit (INDEPENDENT_AMBULATORY_CARE_PROVIDER_SITE_OTHER): Payer: Medicare Other | Admitting: Student in an Organized Health Care Education/Training Program

## 2017-03-15 VITALS — BP 135/70 | HR 70 | Temp 97.9°F | Ht 64.0 in | Wt 314.3 lb

## 2017-03-15 DIAGNOSIS — I10 Essential (primary) hypertension: Secondary | ICD-10-CM

## 2017-03-15 DIAGNOSIS — Z7901 Long term (current) use of anticoagulants: Secondary | ICD-10-CM

## 2017-03-15 DIAGNOSIS — D6859 Other primary thrombophilia: Secondary | ICD-10-CM

## 2017-03-15 DIAGNOSIS — R600 Localized edema: Secondary | ICD-10-CM

## 2017-03-15 DIAGNOSIS — Z87891 Personal history of nicotine dependence: Secondary | ICD-10-CM

## 2017-03-15 DIAGNOSIS — Z86711 Personal history of pulmonary embolism: Secondary | ICD-10-CM | POA: Diagnosis not present

## 2017-03-15 DIAGNOSIS — Z79899 Other long term (current) drug therapy: Secondary | ICD-10-CM | POA: Diagnosis not present

## 2017-03-15 DIAGNOSIS — G4733 Obstructive sleep apnea (adult) (pediatric): Secondary | ICD-10-CM | POA: Diagnosis not present

## 2017-03-15 DIAGNOSIS — Z8249 Family history of ischemic heart disease and other diseases of the circulatory system: Secondary | ICD-10-CM

## 2017-03-15 MED ORDER — FUROSEMIDE 40 MG PO TABS: 80.0000 mg | ORAL_TABLET | Freq: Every day | ORAL | 3 refills | Status: DC

## 2017-03-15 MED ORDER — CARVEDILOL 25 MG PO TABS: 25.0000 mg | ORAL_TABLET | Freq: Two times a day (BID) | ORAL | 5 refills | Status: DC

## 2017-03-15 NOTE — Assessment & Plan Note (Signed)
Obstructive sleep apnea diagnosed on sleep study in 2017. Patient has had difficulty tolerating her mask fit for many months. I again encouraged her to please work with the home health company to find a mask that she will be able to wear. I think that her uncontrolled sleep apnea is making her hypertension more difficult to treat as well as causing her some chronic problems with fatigue.

## 2017-03-15 NOTE — Assessment & Plan Note (Signed)
Lower extremities edema appears to be stable today. We are going to increase the furosemide back to 80 mg once daily to try to improve her chronic edema little more, and hopefully also improve her hypertension.

## 2017-03-15 NOTE — Progress Notes (Signed)
   Assessment and Plan:  See Encounters tab for problem-based medical decision making.   __________________________________________________________  HPI:   76 year old woman here for follow-up of bright red blood per rectum. She first had this intermittent painless bleeding about 2 months ago. She is anticoagulated because of prior unprovoked large pulmonary embolism. Recently she has been struggling with chronic Coumadin therapy, she was feeling like it was causing her to have increasing fatigue and malaise as well. After the second episode of bright red blood per rectum, she was valuated in the acute care clinic, medical team and pharmacy changed her from Coumadin to low-dose rivaroxaban. She reported that she's not had any further bleeding since making this change. She also feels like some of the vague symptoms are now much improved. She reports good compliance with her medications, denies any other side effects. No other easy bleeding or bruising. She reports being under a lot stress right now because her brother is admitted to a hospital for a heart attack. he's been there several weeks, still on the ventilator with tracheostomy, has a long way to go before he is able to go to rehabilitation and eventually home.  __________________________________________________________  Problem List: Patient Active Problem List   Diagnosis Date Noted  . Hypercoagulable state (HCC)     Priority: High  . Sleep apnea 07/08/2015    Priority: High  . Bilateral lower extremity edema 01/09/2014    Priority: Medium  . Hyperuricemia 05/25/2013    Priority: Medium  . Nephrolithiasis 05/25/2013    Priority: Medium  . Hypertension 12/22/2011    Priority: Medium  . Obesity, Class III, BMI 40-49.9 (morbid obesity) (HCC) 12/22/2011    Priority: Medium  . Asthma 12/04/2011    Priority: Low  . 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 Routine health maintenance 09/15/2011    Priority: Low     Medications: Reconciled today in Epic __________________________________________________________  Physical Exam:  Vital Signs: Vitals:   03/15/17 0858 03/15/17 0959  BP: (!) 164/77 135/70  Pulse: 76 70  Temp: 97.9 F (36.6 C)   TempSrc: Oral   SpO2: 96%   Weight: (!) 314 lb 4.8 oz (142.6 kg)   Height: 5\' 4"  (1.626 m)     Gen: Well appearing, NAD Neck: No cervical LAD, No thyromegaly or nodules, No JVD. CV: RRR, no murmurs Pulm: Normal effort, CTA throughout, no wheezing Ext: Warm, Chronic 2+ nonpitting edema bilaterally Skin: No atypical appearing moles. No rashes, mild stasis changes of her bilateral lower extremities.

## 2017-03-15 NOTE — Assessment & Plan Note (Signed)
Patient with a large unprovoked pulmonary embolism in April 2017 that is been anticoagulated with Coumadin since that time. About one month ago pharmacy changed her from Coumadin to rivaroxaban 10 mg once daily because she was having minor lower GI bleeding intermittently. Since that change the patient reports feeling much better. She says that she has not had any further bright red blood per rectum. Plan is to continue with rivaroxaban 10 mg once daily indefinitely.

## 2017-03-15 NOTE — Patient Instructions (Signed)
1. Increase Furosemide (Lasix) to 80mg  once a day.   2. Increase Carvedilol to two pills (25 mg) twice a day.

## 2017-03-15 NOTE — Assessment & Plan Note (Signed)
Blood pressure elevated above goal today. Improved at the end of the visit on recheck, however given how high it was initially I think that she is spending a lot of her time well above our goal for hypertension. Plan is to continue with amlodipine, increase carvedilol to 25 mg twice a day, and increase furosemide to 80 mg daily. Fortunately were very limited on other antihypertensives because of a history of anaphylaxis with hydralazine, angioedema with ACE inhibitor, and renal stones with thiazide diuretics.

## 2017-04-09 ENCOUNTER — Other Ambulatory Visit: Payer: Self-pay | Admitting: Student in an Organized Health Care Education/Training Program

## 2017-04-14 DIAGNOSIS — J9601 Acute respiratory failure with hypoxia: Secondary | ICD-10-CM | POA: Diagnosis not present

## 2017-05-10 ENCOUNTER — Ambulatory Visit (INDEPENDENT_AMBULATORY_CARE_PROVIDER_SITE_OTHER): Payer: Medicare Other | Admitting: Student in an Organized Health Care Education/Training Program

## 2017-05-10 VITALS — BP 138/62 | HR 66 | Temp 97.8°F | Ht 64.0 in | Wt 309.0 lb

## 2017-05-10 DIAGNOSIS — G4733 Obstructive sleep apnea (adult) (pediatric): Secondary | ICD-10-CM

## 2017-05-10 DIAGNOSIS — J4520 Mild intermittent asthma, uncomplicated: Secondary | ICD-10-CM

## 2017-05-10 DIAGNOSIS — R600 Localized edema: Secondary | ICD-10-CM | POA: Diagnosis not present

## 2017-05-10 DIAGNOSIS — I10 Essential (primary) hypertension: Secondary | ICD-10-CM | POA: Diagnosis not present

## 2017-05-10 DIAGNOSIS — Z23 Encounter for immunization: Secondary | ICD-10-CM | POA: Diagnosis not present

## 2017-05-10 DIAGNOSIS — J45909 Unspecified asthma, uncomplicated: Secondary | ICD-10-CM

## 2017-05-10 DIAGNOSIS — Z6841 Body Mass Index (BMI) 40.0 and over, adult: Secondary | ICD-10-CM

## 2017-05-10 MED ORDER — ALBUTEROL SULFATE HFA 108 (90 BASE) MCG/ACT IN AERS: INHALATION_SPRAY | RESPIRATORY_TRACT | 3 refills | Status: DC

## 2017-05-10 MED ORDER — FUROSEMIDE 80 MG PO TABS: 80.0000 mg | ORAL_TABLET | Freq: Every day | ORAL | 3 refills | Status: DC

## 2017-05-10 NOTE — Assessment & Plan Note (Signed)
Blood pressure better controlled today on current regimen. Plan is to continue with carvedilol 25 mg twice a day, amlodipine 10 mg daily, and furosemide 80 mg daily. She checks blood pressures at home and will bring her meter in to the next visit.

## 2017-05-10 NOTE — Patient Instructions (Signed)
1. Increase lasix to 80 mg once a day for your swelling.   2. Continue your blood pressure medicines as usual. Bring your BP cuff into clinic next time so I can look at the reading.   3. We will check your blood work at next visit.   4. Come back in 2 months for follow up.

## 2017-05-10 NOTE — Assessment & Plan Note (Signed)
Continues to struggle with poor compliance with CPAP machine because of discomfort. She is going to contact the health company to troubleshoot the fit of her mask. We also talked about her general poor sleep quality. She watches TV in bed until about 2 AM when she tries to fall asleep. Sometimes has headaches in the evening. We talked about limiting screen time and how that contributes to poor sleep quality. She is given a try setting a bedtime of 11 PM, screens off at 10:30.

## 2017-05-10 NOTE — Assessment & Plan Note (Signed)
Patient with severe lower extremity edema which is chronic and driven by obesity. It appears stable today.I wanted to increase the furosemide to 80 mg 2 months ago, but she did not make this change because of a misunderstanding. Blood pressure is better today, I think it's okay to still try going back to 80 mg, she really does want to improve the lower extremity edema. Follow-up in 2 months when we will recheck her BMP. Continue with potassium supplementation for now.

## 2017-05-10 NOTE — Progress Notes (Signed)
   Assessment and Plan:  See Encounters tab for problem-based medical decision making.   __________________________________________________________  HPI:   76 year old woman here for hypertension follow-up. Patient was in clinic about a month ago with persistently elevated hyper tension which we struggled to treat because she has significant reactions to other classes of antihypertensive medications. She reports increasing carvedilol and doing fairly well. Says that she has noticed increasing headaches in the evening times. We also did wanted to increase her furosemide at the last visit, but there is miscommunication and she did not make this increase. Patient tells me she continues to struggle with sleep. CPAP machine is uncomfortable for her and she has not yet followed up with the home health company. She likes to watch TV for several hours in the evening, says she often watch TV until 2 AM. Likes to watch investigative shows like [employer]. She has had this in Epic and stress over the last few months with her brothers hospitalization, apparently he is getting out of rehabilitation and returning home to [place] soon. Patient denies any recent fevers or chills, no chest pain, no dyspnea on exertion, no hospitalizations, ED visits, or urgent care.  __________________________________________________________  Problem List: Patient Active Problem List   Diagnosis Date Noted  . Hypercoagulable state (HCC)     Priority: High  . Sleep apnea 07/08/2015    Priority: High  . Bilateral lower extremity edema 01/09/2014    Priority: Medium  . Hyperuricemia 05/25/2013    Priority: Medium  . Nephrolithiasis 05/25/2013    Priority: Medium  . Hypertension 12/22/2011    Priority: Medium  . Obesity, Class III, BMI 40-49.9 (morbid obesity) (HCC) 12/22/2011    Priority: Medium  . Asthma 12/04/2011    Priority: Low  . 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 Routine health maintenance 09/15/2011   Priority: Low    Medications: Reconciled today in Epic __________________________________________________________  Physical Exam:  Vital Signs: Vitals:   05/10/17 0818 05/10/17 0844  BP: 138/62   Pulse: 66   Temp: 97.8 F (36.6 C)   TempSrc: Oral   SpO2: 96%   Weight: (!) 309 lb (140.2 kg)   Height:   (1.626 m)    Gen: Well appearing, NAD ENT: OP clear without erythema or exudate.  Neck: No cervical LAD, No thyromegaly or nodules, No JVD. CV: RRR, no murmurs Pulm: Normal effort, CTA throughout, no wheezing Abd: Soft, NT, ND, normal BS.  Ext: Warm, no edema, normal joints Skin: No atypical appearing moles. No rashes

## 2017-05-10 NOTE — Assessment & Plan Note (Signed)
Symptomatically well controlled. Rarely uses rescue inhaler. I refilled albuterol, plan to continue with as needed meds.

## 2017-05-10 NOTE — Assessment & Plan Note (Signed)
Weight down 5 lbs in two months. BMI 54. She continues to report mostly healthy foods at home, which is probably why she does not have diabetes. We talked about continuing healthy nutrition and setting reasonable expectations.

## 2017-05-14 DIAGNOSIS — J9601 Acute respiratory failure with hypoxia: Secondary | ICD-10-CM | POA: Diagnosis not present

## 2017-06-14 ENCOUNTER — Other Ambulatory Visit: Payer: Self-pay | Admitting: Student in an Organized Health Care Education/Training Program

## 2017-06-14 ENCOUNTER — Other Ambulatory Visit: Payer: Self-pay | Admitting: Internal Medicine

## 2017-06-14 DIAGNOSIS — J9601 Acute respiratory failure with hypoxia: Secondary | ICD-10-CM | POA: Diagnosis not present

## 2017-06-15 ENCOUNTER — Telehealth: Payer: Self-pay | Admitting: Student in an Organized Health Care Education/Training Program

## 2017-06-15 NOTE — Telephone Encounter (Signed)
Patient would like a call back about her medication refill.

## 2017-06-15 NOTE — Telephone Encounter (Signed)
Reassured pt that her scripts went to [facility]

## 2017-07-05 ENCOUNTER — Encounter: Payer: Self-pay | Admitting: Student in an Organized Health Care Education/Training Program

## 2017-07-05 ENCOUNTER — Other Ambulatory Visit: Payer: Self-pay

## 2017-07-05 ENCOUNTER — Encounter (INDEPENDENT_AMBULATORY_CARE_PROVIDER_SITE_OTHER): Payer: Self-pay

## 2017-07-05 ENCOUNTER — Ambulatory Visit (INDEPENDENT_AMBULATORY_CARE_PROVIDER_SITE_OTHER): Payer: Medicare Other | Admitting: Student in an Organized Health Care Education/Training Program

## 2017-07-05 VITALS — BP 144/57 | HR 74 | Temp 98.1°F | Ht 64.0 in | Wt 309.7 lb

## 2017-07-05 DIAGNOSIS — E790 Hyperuricemia without signs of inflammatory arthritis and tophaceous disease: Secondary | ICD-10-CM | POA: Diagnosis not present

## 2017-07-05 DIAGNOSIS — R600 Localized edema: Secondary | ICD-10-CM | POA: Diagnosis not present

## 2017-07-05 DIAGNOSIS — G4733 Obstructive sleep apnea (adult) (pediatric): Secondary | ICD-10-CM

## 2017-07-05 DIAGNOSIS — D6859 Other primary thrombophilia: Secondary | ICD-10-CM | POA: Diagnosis not present

## 2017-07-05 DIAGNOSIS — I10 Essential (primary) hypertension: Secondary | ICD-10-CM

## 2017-07-05 MED ORDER — FLUTICASONE PROPIONATE HFA 44 MCG/ACT IN AERO: 2.0000 | INHALATION_SPRAY | Freq: Every day | RESPIRATORY_TRACT | 5 refills | Status: DC

## 2017-07-05 NOTE — Assessment & Plan Note (Signed)
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  Patient with good compliance allopurinol 300 mg daily, no side effects.  She also takes potassium citrate for stone prevention.  Plan to check uric acid levels today, try to keep level around 7.

## 2017-07-05 NOTE — Assessment & Plan Note (Signed)
Blood pressure 138/70 on recheck today.  This is really the upper limit of our goal blood pressure for her, I think we have not achieved optimal control because her sleep apnea is not yet stable on home CPAP either.  Plan is to continue amlodipine 10, carvedilol 25 twice daily, and furosemide 80 mg once daily.  Check BMP today.  Working on compliance with CPAP machine at home.

## 2017-07-05 NOTE — Assessment & Plan Note (Signed)
Sleep study is completed in 2017, but unfortunately the patient has struggled to initiate home CPAP because of discomfort with the machine.  I again have encouraged her to work with the home health company to find a mask that will fit her well.  I think this will greatly help her daytime fatigue and potentially even blood pressure control.

## 2017-07-05 NOTE — Assessment & Plan Note (Signed)
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  Echo 2017 with normal EF, potentially diastolic dysfunction from mild LVH.  No other signs or symptoms of heart failure.  Edema is minimally responsive to Lasix 80 mg once daily.  Patient unable to do compression stockings because she has difficulty putting them on and taking them off.  She does a good job moisturizing her skin, so I do not see any signs of venous stasis, but this seems almost inevitable in the future given her degree of edema.  Patient is going to continue to work on weight loss, unfortunately I do not think we have any other options right now.

## 2017-07-05 NOTE — Assessment & Plan Note (Signed)
Unprovoked large pulmonary embolism in April 2017, subsequently anticoagulated with Coumadin because of elevated BMI.  Did not tolerate Coumadin well because of fatigue, so transitioned to low-dose rivaroxaban 10mg  daily for continued secondary prevention.  Doing well with no bleeding events, no side effects.  Plan to check kidney function today.  Continue with anticoagulation indefinitely.

## 2017-07-05 NOTE — Progress Notes (Signed)
   Assessment and Plan:  See Encounters tab for problem-based medical decision making.   __________________________________________________________  HPI:   76 year old woman here for follow-up of hypertension.  Patient reports doing fairly well at home.  Good compliance with medications with no side effects.  No recent hospitalizations.  [place].  Still struggling with compliance with CPAP machine at home.  Says that 1 of the pieces that connects the mask the machine broke.  She has not yet called the home health company to have this replaced.  She says she is going to try to call.  No chest pain or pressure.  No shortness of breath.  No fevers or chills.  Drinking well.  Tolerating anticoagulation well.  No bleeding or easy bruising.  __________________________________________________________  Problem List: Patient Active Problem List   Diagnosis Date Noted  . Hypercoagulable state (HCC)     Priority: High  . Sleep apnea 07/08/2015    Priority: High  . Bilateral lower extremity edema 01/09/2014    Priority: Medium  . Hyperuricemia 05/25/2013    Priority: Medium  . Nephrolithiasis 05/25/2013    Priority: Medium  . Hypertension 12/22/2011    Priority: Medium  . Obesity, Class III, BMI 40-49.9 (morbid obesity) (HCC) 12/22/2011    Priority: Medium  . Asthma 12/04/2011    Priority: Low  . 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 Routine health maintenance 09/15/2011    Priority: Low    Medications: Reconciled today in Epic __________________________________________________________  Physical Exam:  Vital Signs: Vitals:   07/05/17 0910  BP: (!) 144/57  Pulse: 74  Temp: 98.1 F (36.7 C)  TempSrc: Oral  SpO2: 97%  Weight: (!) 309 lb 11.2 oz (140.5 kg)  Height: 5\' 4"  (1.626 m)    Gen: Well appearing, NAD CV: RRR, no murmurs Pulm: Normal effort, CTA throughout, no wheezing Abd: Soft, obese, NT,  ND, well-healed midline scar and right abdominal scar, large hernia on the right abdominal wall likely incisional in origin.  Ext: Warm, 2+ nonpitting edema equal bilaterally, tennis shoes unable to be tied because of edema.

## 2017-07-06 ENCOUNTER — Other Ambulatory Visit: Payer: Self-pay | Admitting: Student in an Organized Health Care Education/Training Program

## 2017-07-06 DIAGNOSIS — Z1231 Encounter for screening mammogram for malignant neoplasm of breast: Secondary | ICD-10-CM

## 2017-07-06 LAB — BMP8+ANION GAP
ANION GAP: 14 mmol/L (ref 10.0–18.0)
BUN / CREAT RATIO: 15 (ref 12–28)
BUN: 12 mg/dL (ref 8–27)
CALCIUM: 9.7 mg/dL (ref 8.7–10.3)
CHLORIDE: 99 mmol/L (ref 96–106)
CO2: 29 mmol/L (ref 20–29)
Creatinine, Ser: 0.81 mg/dL (ref 0.57–1.00)
GFR calc Af Amer: 83 mL/min/{1.73_m2} (ref >59)
GFR calc non Af Amer: 72 mL/min/{1.73_m2} (ref >59)
GLUCOSE: 104 mg/dL — AB (ref 65–99)
Potassium: 4.1 mmol/L (ref 3.5–5.2)
Sodium: 142 mmol/L (ref 134–144)

## 2017-07-06 LAB — LIPID PANEL
Chol/HDL Ratio: 3.5 ratio (ref 0.0–4.4)
Cholesterol, Total: 181 mg/dL (ref 100–199)
HDL: 52 mg/dL (ref >39)
LDL Calculated: 106 mg/dL — ABNORMAL HIGH (ref 0–99)
TRIGLYCERIDES: 114 mg/dL (ref 0–149)
VLDL Cholesterol Cal: 23 mg/dL (ref 5–40)

## 2017-07-06 LAB — URIC ACID: URIC ACID: 3 mg/dL (ref 2.5–7.1)

## 2017-07-07 ENCOUNTER — Encounter: Payer: Self-pay | Admitting: Student in an Organized Health Care Education/Training Program

## 2017-07-13 ENCOUNTER — Other Ambulatory Visit: Payer: Self-pay | Admitting: Student in an Organized Health Care Education/Training Program

## 2017-07-14 DIAGNOSIS — J9601 Acute respiratory failure with hypoxia: Secondary | ICD-10-CM | POA: Diagnosis not present

## 2017-07-22 ENCOUNTER — Other Ambulatory Visit: Payer: Self-pay | Admitting: *Deleted

## 2017-07-22 DIAGNOSIS — R600 Localized edema: Secondary | ICD-10-CM

## 2017-07-22 MED ORDER — AMLODIPINE BESYLATE 10 MG PO TABS: ORAL_TABLET | ORAL | 1 refills | Status: DC

## 2017-07-22 MED ORDER — ALBUTEROL SULFATE HFA 108 (90 BASE) MCG/ACT IN AERS: INHALATION_SPRAY | RESPIRATORY_TRACT | 3 refills | Status: DC

## 2017-07-22 MED ORDER — POTASSIUM CITRATE ER 15 MEQ (1620 MG) PO TBCR: 1.0000 | EXTENDED_RELEASE_TABLET | Freq: Two times a day (BID) | ORAL | 4 refills | Status: DC

## 2017-07-22 MED ORDER — RIVAROXABAN 10 MG PO TABS: 10.0000 mg | ORAL_TABLET | Freq: Every day | ORAL | 3 refills | Status: DC

## 2017-07-22 MED ORDER — CARVEDILOL 25 MG PO TABS: 25.0000 mg | ORAL_TABLET | Freq: Two times a day (BID) | ORAL | 5 refills | Status: DC

## 2017-07-22 MED ORDER — FUROSEMIDE 80 MG PO TABS: 80.0000 mg | ORAL_TABLET | Freq: Every day | ORAL | 3 refills | Status: DC

## 2017-07-23 ENCOUNTER — Telehealth: Payer: Self-pay | Admitting: Student in an Organized Health Care Education/Training Program

## 2017-07-23 NOTE — Telephone Encounter (Signed)
Called script to [facility], it was done 12/3 and sent to [facility]

## 2017-07-23 NOTE — Telephone Encounter (Signed)
[facility] CALLED , PT NEEDS REFILL APPROVAL  FROM DR.VINCENT  FOR FLOVENT HFA AER 44 [facility]CG, [phone], REFERENCE # [id number]

## 2017-08-04 ENCOUNTER — Ambulatory Visit
Admission: RE | Admit: 2017-08-04 | Discharge: 2017-08-04 | Disposition: A | Discharge status: Home / Self Care | Payer: Medicare Other | Source: Ambulatory Visit | Attending: Student in an Organized Health Care Education/Training Program | Admitting: Student in an Organized Health Care Education/Training Program

## 2017-08-04 DIAGNOSIS — Z1231 Encounter for screening mammogram for malignant neoplasm of breast: Secondary | ICD-10-CM | POA: Diagnosis not present

## 2017-08-11 ENCOUNTER — Other Ambulatory Visit: Payer: Self-pay | Admitting: *Deleted

## 2017-08-11 MED ORDER — LOPERAMIDE HCL 2 MG PO CAPS: ORAL_CAPSULE | ORAL | 0 refills | Status: DC

## 2017-08-11 NOTE — Telephone Encounter (Signed)
Next appt scheduled  11/01/17 with PCP.

## 2017-08-14 DIAGNOSIS — J9601 Acute respiratory failure with hypoxia: Secondary | ICD-10-CM | POA: Diagnosis not present

## 2017-09-14 DIAGNOSIS — J9601 Acute respiratory failure with hypoxia: Secondary | ICD-10-CM | POA: Diagnosis not present

## 2017-10-07 DIAGNOSIS — H353131 Nonexudative age-related macular degeneration, bilateral, early dry stage: Secondary | ICD-10-CM | POA: Diagnosis not present

## 2017-10-07 DIAGNOSIS — H2513 Age-related nuclear cataract, bilateral: Secondary | ICD-10-CM | POA: Diagnosis not present

## 2017-10-07 DIAGNOSIS — H1851 Endothelial corneal dystrophy: Secondary | ICD-10-CM | POA: Diagnosis not present

## 2017-10-11 ENCOUNTER — Telehealth: Payer: Self-pay | Admitting: Student in an Organized Health Care Education/Training Program

## 2017-10-11 DIAGNOSIS — H524 Presbyopia: Secondary | ICD-10-CM | POA: Diagnosis not present

## 2017-10-11 DIAGNOSIS — H5213 Myopia, bilateral: Secondary | ICD-10-CM | POA: Diagnosis not present

## 2017-10-11 NOTE — Telephone Encounter (Signed)
Patient received her medicine in the mail and she received carvedilol 25mg  she is saying normally she been getting 12.5 mg, when [facility] was processing her medicine. Pls contact patient

## 2017-10-12 DIAGNOSIS — J9601 Acute respiratory failure with hypoxia: Secondary | ICD-10-CM | POA: Diagnosis not present

## 2017-10-12 NOTE — Telephone Encounter (Signed)
Returned patient's call. States she has been taking carvedilol 12.5 mg twice daily. Informed patient that PCP increased carvedilol to 25 mg bid at 03/15/2017 OV. States she did not know this. Took 25 mg twice yesterday and states she feels better. Denies dizziness. Will continue to take 25 mg bid and keep appt with PCP on 11/01/2017. Patient instructed to take home BP 2 hours after AM dose of carvedilol 25 mg and call office if systolic < 110 or if she experiences any dizziness. Patient states understanding. Kinnie FeilL. Wanya Bangura, RN, BSN

## 2017-10-25 ENCOUNTER — Other Ambulatory Visit: Payer: Self-pay

## 2017-10-25 ENCOUNTER — Encounter: Payer: Self-pay | Admitting: Student in an Organized Health Care Education/Training Program

## 2017-10-25 ENCOUNTER — Ambulatory Visit (INDEPENDENT_AMBULATORY_CARE_PROVIDER_SITE_OTHER): Payer: Medicare Other | Admitting: Student in an Organized Health Care Education/Training Program

## 2017-10-25 VITALS — BP 148/82 | HR 88 | Temp 98.0°F | Wt 308.8 lb

## 2017-10-25 DIAGNOSIS — R05 Cough: Secondary | ICD-10-CM | POA: Diagnosis not present

## 2017-10-25 DIAGNOSIS — Z905 Acquired absence of kidney: Secondary | ICD-10-CM

## 2017-10-25 DIAGNOSIS — Z9989 Dependence on other enabling machines and devices: Secondary | ICD-10-CM

## 2017-10-25 DIAGNOSIS — I10 Essential (primary) hypertension: Secondary | ICD-10-CM

## 2017-10-25 DIAGNOSIS — Z888 Allergy status to other drugs, medicaments and biological substances status: Secondary | ICD-10-CM

## 2017-10-25 DIAGNOSIS — Z86711 Personal history of pulmonary embolism: Secondary | ICD-10-CM

## 2017-10-25 DIAGNOSIS — Z6841 Body Mass Index (BMI) 40.0 and over, adult: Secondary | ICD-10-CM | POA: Diagnosis not present

## 2017-10-25 DIAGNOSIS — Z7901 Long term (current) use of anticoagulants: Secondary | ICD-10-CM | POA: Diagnosis not present

## 2017-10-25 DIAGNOSIS — G4730 Sleep apnea, unspecified: Secondary | ICD-10-CM | POA: Diagnosis not present

## 2017-10-25 DIAGNOSIS — R011 Cardiac murmur, unspecified: Secondary | ICD-10-CM | POA: Diagnosis not present

## 2017-10-25 DIAGNOSIS — Z87442 Personal history of urinary calculi: Secondary | ICD-10-CM | POA: Diagnosis not present

## 2017-10-25 DIAGNOSIS — Z79899 Other long term (current) drug therapy: Secondary | ICD-10-CM | POA: Diagnosis not present

## 2017-10-25 DIAGNOSIS — Z9119 Patient's noncompliance with other medical treatment and regimen: Secondary | ICD-10-CM

## 2017-10-25 DIAGNOSIS — G4733 Obstructive sleep apnea (adult) (pediatric): Secondary | ICD-10-CM

## 2017-10-25 DIAGNOSIS — D6859 Other primary thrombophilia: Secondary | ICD-10-CM

## 2017-10-25 DIAGNOSIS — Z0000 Encounter for general adult medical examination without abnormal findings: Secondary | ICD-10-CM

## 2017-10-25 NOTE — Assessment & Plan Note (Signed)
Sleep apnea continues to be uncontrolled because she has not been compliant with wearing the mask.  I asked her once again to please follow-up with the home health company to try to find a more comfortable fitting mask that she can wear at night.  Patient voiced that she understands and that she is going to call them this time.

## 2017-10-25 NOTE — Patient Instructions (Signed)
1. Your blood pressure is still elevated, we talked about adding another blood pressure medicine called losartan today.  We decided to hold off on starting a new medicine, he wanted to come back in 1 month and bring your blood pressure cuff then so we can check it against our machine.

## 2017-10-25 NOTE — Assessment & Plan Note (Signed)
Weight 308 pounds, essentially unchanged from prior.  She continues to reports that her diet is poor.  I gave her education today in counseling and encouragement that weight loss would probably be most beneficial thing for her moving forward.

## 2017-10-25 NOTE — Assessment & Plan Note (Signed)
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  She is tolerating rivaroxaban well with no bleeding or other side effects.  I did advise her that we need to get her blood pressure under more consistent control or she will will be at high risk for spontaneous bleeding, which could even be life-threatening.  Patient voiced understanding.

## 2017-10-25 NOTE — Assessment & Plan Note (Signed)
Blood pressure elevated when coming into the office today at 177/82 stressed, running late today, on repeat it was 148/82.  Blood pressures are pretty consistently above goal over our visits.  She reports her home cuff reading is always normal.  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  She reports good compliance with amlodipine 10 and carvedilol 25 mg twice daily which I think is fine to continue.  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  I advised her that I we should start a angiotensin receptor blocker, in my view I do not think she will be at increased risk for angioedema with an ARB as she is with ACEi.  The patient is very hesitant to start another medication.  She wants to give it more time.  I asked her to bring her home blood pressure cuff in to our visit next time so we can review her home readings and to check its accuracy against our machine.  Patient reported understanding and will follow up with me in 1 month.

## 2017-10-25 NOTE — Progress Notes (Signed)
   Assessment and Plan:  See Encounters tab for problem-based medical decision making.   __________________________________________________________  HPI:  77 year old woman here for follow-up of hypertension.  She reports having a viral upper respiratory tract infection over the last 2 weeks.  It has been a nonproductive cough for which she is using over-the-counter supportive care.  Denies any fevers or chills or shortness of breath.  She reports good compliance with her medications, though she did not take her blood pressure medicine this morning because she was rushed to get out of the house for our office visit today.  Denies any chest pain or dyspnea on exertion.  She reports poor compliance with CPAP as it is still difficult for her to tolerate the mask at night.  She has not yet followed up with the home health company about trying a different mask or adjusting the fit.  She lives by herself and is independent in all her activities of daily living.  Her transportation is limited as she does not have a car and she has to arrange for other transportation.  __________________________________________________________  Problem List: Patient Active Problem List   Diagnosis Date Noted  . Hypercoagulable state (HCC)     Priority: High  . Sleep apnea 07/08/2015    Priority: High  . Bilateral lower extremity edema 01/09/2014    Priority: Medium  . Hyperuricemia 05/25/2013    Priority: Medium  . Nephrolithiasis 05/25/2013    Priority: Medium  . Hypertension 12/22/2011    Priority: Medium  . Obesity, Class III, BMI 40-49.9 (morbid obesity) (HCC) 12/22/2011    Priority: Medium  . Asthma 12/04/2011    Priority: Low  . 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 Routine health maintenance 09/15/2011    Priority: Low    Medications: Reconciled today in Epic __________________________________________________________  Physical Exam:  Vital Signs: Vitals:   10/25/17 0931 10/25/17 0950   BP: (!) 177/82 (!) 148/82  Pulse: 88   Temp: 98 F (36.7 C)   TempSrc: Oral   SpO2: 96%   Weight: (!) 308 lb 12.8 oz (140.1 kg)     Gen: Well appearing, NAD  CV: RRR, 2 out of 6 early systolic murmur at the right upper sternal border Pulm: Normal effort, CTA throughout, no wheezing Ext: Warm, 2+ nonpitting edema bilaterally

## 2017-10-25 NOTE — Assessment & Plan Note (Signed)
Up-to-date on colon cancer screening and immunizations.  She has been getting annual mammography which have all been normal.  I advised that now she is over 77-year she can discontinue mammograms.  She seemed incredulous about this, and said that as long as the mammograms center is sending her reminders, she will continue to go there for the testing. I think she was offended that I suggested she was "too old" for mammograms, so I will try to address this again in the future.

## 2017-11-01 ENCOUNTER — Ambulatory Visit: Payer: Medicare Other | Admitting: Student in an Organized Health Care Education/Training Program

## 2017-11-02 ENCOUNTER — Other Ambulatory Visit: Payer: Self-pay | Admitting: Student in an Organized Health Care Education/Training Program

## 2017-11-03 DIAGNOSIS — N200 Calculus of kidney: Secondary | ICD-10-CM | POA: Diagnosis not present

## 2017-11-08 DIAGNOSIS — Z905 Acquired absence of kidney: Secondary | ICD-10-CM | POA: Diagnosis not present

## 2017-11-08 DIAGNOSIS — E790 Hyperuricemia without signs of inflammatory arthritis and tophaceous disease: Secondary | ICD-10-CM | POA: Diagnosis not present

## 2017-11-08 DIAGNOSIS — N200 Calculus of kidney: Secondary | ICD-10-CM | POA: Diagnosis not present

## 2017-11-12 DIAGNOSIS — J9601 Acute respiratory failure with hypoxia: Secondary | ICD-10-CM | POA: Diagnosis not present

## 2017-11-29 ENCOUNTER — Ambulatory Visit (INDEPENDENT_AMBULATORY_CARE_PROVIDER_SITE_OTHER): Payer: Medicare Other | Admitting: Student in an Organized Health Care Education/Training Program

## 2017-11-29 ENCOUNTER — Encounter: Payer: Self-pay | Admitting: Student in an Organized Health Care Education/Training Program

## 2017-11-29 VITALS — BP 135/70 | HR 70 | Wt 299.9 lb

## 2017-11-29 DIAGNOSIS — Z86711 Personal history of pulmonary embolism: Secondary | ICD-10-CM

## 2017-11-29 DIAGNOSIS — Z905 Acquired absence of kidney: Secondary | ICD-10-CM

## 2017-11-29 DIAGNOSIS — Z7901 Long term (current) use of anticoagulants: Secondary | ICD-10-CM

## 2017-11-29 DIAGNOSIS — D6859 Other primary thrombophilia: Secondary | ICD-10-CM

## 2017-11-29 DIAGNOSIS — Z888 Allergy status to other drugs, medicaments and biological substances status: Secondary | ICD-10-CM | POA: Diagnosis not present

## 2017-11-29 DIAGNOSIS — I10 Essential (primary) hypertension: Secondary | ICD-10-CM | POA: Diagnosis not present

## 2017-11-29 DIAGNOSIS — Z87442 Personal history of urinary calculi: Secondary | ICD-10-CM | POA: Diagnosis not present

## 2017-11-29 DIAGNOSIS — Z6841 Body Mass Index (BMI) 40.0 and over, adult: Secondary | ICD-10-CM

## 2017-11-29 MED ORDER — POTASSIUM CITRATE ER 15 MEQ (1620 MG) PO TBCR: 1.0000 | EXTENDED_RELEASE_TABLET | Freq: Two times a day (BID) | ORAL | 4 refills | Status: DC

## 2017-11-29 MED ORDER — LOSARTAN POTASSIUM 25 MG PO TABS: 25.0000 mg | ORAL_TABLET | Freq: Every day | ORAL | 3 refills | Status: DC

## 2017-11-29 NOTE — Patient Instructions (Signed)
We have been to start one new medicine for your blood pressure.  Please call me if you have any side effects.  Losartan tablets What is this medicine? LOSARTAN (loe SAR tan) is used to treat high blood pressure and to reduce the risk of stroke in certain patients. This drug also slows the progression of kidney disease in patients with diabetes. This medicine may be used for other purposes; ask your health care provider or pharmacist if you have questions. COMMON BRAND NAME(S): Cozaar What should I tell my health care provider before I take this medicine? They need to know if you have any of these conditions: -heart failure -kidney or liver disease -an unusual or allergic reaction to losartan, other medicines, foods, dyes, or preservatives -pregnant or trying to get pregnant -breast-feeding How should I use this medicine? Take this medicine by mouth with a glass of water. Follow the directions on the prescription label. This medicine can be taken with or without food. Take your doses at regular intervals. Do not take your medicine more often than directed. Talk to your pediatrician regarding the use of this medicine in children. Special care may be needed. Overdosage: If you think you have taken too much of this medicine contact a poison control center or emergency room at once. NOTE: This medicine is only for you. Do not share this medicine with others. What if I miss a dose? If you miss a dose, take it as soon as you can. If it is almost time for your next dose, take only that dose. Do not take double or extra doses. What may interact with this medicine? -blood pressure medicines -diuretics, especially triamterene, spironolactone, or amiloride -fluconazole -NSAIDs, medicines for pain and inflammation, like ibuprofen or naproxen -potassium salts or potassium supplements -rifampin This list may not describe all possible interactions. Give your health care provider a list of all the  medicines, herbs, non-prescription drugs, or dietary supplements you use. Also tell them if you smoke, drink alcohol, or use illegal drugs. Some items may interact with your medicine. What should I watch for while using this medicine? Visit your doctor or health care professional for regular checks on your progress. Check your blood pressure as directed. Ask your doctor or health care professional what your blood pressure should be and when you should contact him or her. Call your doctor or health care professional if you notice an irregular or fast heart beat. Women should inform their doctor if they wish to become pregnant or think they might be pregnant. There is a potential for serious side effects to an unborn child, particularly in the second or third trimester. Talk to your health care professional or pharmacist for more information. You may get drowsy or dizzy. Do not drive, use machinery, or do anything that needs mental alertness until you know how this drug affects you. Do not stand or sit up quickly, especially if you are an older patient. This reduces the risk of dizzy or fainting spells. Alcohol can make you more drowsy and dizzy. Avoid alcoholic drinks. Avoid salt substitutes unless you are told otherwise by your doctor or health care professional. Do not treat yourself for coughs, colds, or pain while you are taking this medicine without asking your doctor or health care professional for advice. Some ingredients may increase your blood pressure. What side effects may I notice from receiving this medicine? Side effects that you should report to your doctor or health care professional as soon as possible: -confusion,  dizziness, light headedness or fainting spells -decreased amount of urine passed -difficulty breathing or swallowing, hoarseness, or tightening of the throat -fast or irregular heart beat, palpitations, or chest pain -skin rash, itching -swelling of your face, lips, tongue,  hands, or feet Side effects that usually do not require medical attention (report to your doctor or health care professional if they continue or are bothersome): -cough -decreased sexual function or desire -headache -nasal congestion or stuffiness -nausea or stomach pain -sore or cramping muscles This list may not describe all possible side effects. Call your doctor for medical advice about side effects. You may report side effects to FDA at 1-800-FDA-1088. Where should I keep my medicine? Keep out of the reach of children. Store at room temperature between 15 and 30 degrees C (59 and 86 degrees F). Protect from light. Keep container tightly closed. Throw away any unused medicine after the expiration date. NOTE: This sheet is a summary. It may not cover all possible information. If you have questions about this medicine, talk to your doctor, pharmacist, or health care provider.  2018 Elsevier/Gold Standard (2007-09-30 16:42:18)

## 2017-11-29 NOTE — Assessment & Plan Note (Signed)
Patient tells me that her dentist wants her to stop anticoagulation for a routine cleaning procedure.  Patient had an unprovoked large pulmonary embolism in 2017 due to a likely hypercoagulable state.  We have been using anticoagulation indefinitely since that time.  My best advice to her was to continue with anticoagulation through minor teeth cleaning procedures or fillings.  If they were going to do things with higher bleeding risk like pulling teeth, we can hold the anticoagulation.  I am happy to fill out whenever form the dentist office has, but for now I advised continuing with Xarelto every day.

## 2017-11-29 NOTE — Assessment & Plan Note (Signed)
Blood pressure is complicated in her case, as I wrote in my last note.   blood pressure is still elevated today, did not bring home meter for comparison.  We talked about the goals of more strict blood pressure control especially given that she only has one kidney.  Plan is to add losartan 25 mg daily, continue with Lasix 80 mg daily, amlodipine 10 mg daily, and carvedilol 25 mg twice daily.  We talked about her allergy to lisinopril, described as angioedema, many many years ago.  There is a very low risk of having angioedema with an ARB.  We will see her back in 6 weeks for blood pressure and lab recheck.

## 2017-11-29 NOTE — Progress Notes (Signed)
   Assessment and Plan:  See Encounters tab for problem-based medical decision making.   __________________________________________________________  HPI:   77 year old woman here for follow-up of hypertension.  She has a complicated past medical history of a nephrectomy due to nephrolithiasis, still has one kidney with normal renal function.  We have had difficulty controlling her hypertension with multiple agents.  She reports good compliance.  She reports improved nutrition over the last few weeks, eating less candy and simple sugars.  She reports good exertional capacity with no chest pain or dyspnea on exertion.  She is worried about having another allergic reaction to a new medication, she says many years ago she had swelling of her lips reportedly while taking lisinopril.  __________________________________________________________  Problem List: Patient Active Problem List   Diagnosis Date Noted  . Hypercoagulable state (HCC)     Priority: High  . Sleep apnea 07/08/2015    Priority: High  . Bilateral lower extremity edema 01/09/2014    Priority: Medium  . Hyperuricemia 05/25/2013    Priority: Medium  . Nephrolithiasis 05/25/2013    Priority: Medium  . Hypertension 12/22/2011    Priority: Medium  . Obesity, Class III, BMI 40-49.9 (morbid obesity) (HCC) 12/22/2011    Priority: Medium  . Asthma 12/04/2011    Priority: Low  . 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 Routine health maintenance 09/15/2011    Priority: Low    Medications: Reconciled today in Epic __________________________________________________________  Physical Exam:  Vital Signs: Vitals:   11/29/17 0819 11/29/17 0841  BP: (!) 151/72 135/70  Pulse: 82 70  SpO2: 99%   Weight: 299 lb 14.4 oz (136 kg)     Gen: Well appearing, NAD CV: RRR, no murmurs Pulm: Normal effort, CTA throughout, no wheezing Ext: Warm, large lipedema, no pitting edema today. Neuro: Alert and oriented, conversational,  full strength in the upper lower extremities.  She has a wide-based gait.

## 2017-11-29 NOTE — Assessment & Plan Note (Signed)
Weight today 299 pounds, down 7 pounds from last month.  She is made nice nutrition changes since our last visit. I encouraged her to continue. She is interested in 1200 calorie a day diet, I advised caution. She is low risk for hypoglycemia, but this extreme type of fasting still seems ill advised given her advanced age.

## 2017-12-09 ENCOUNTER — Other Ambulatory Visit: Payer: Self-pay | Admitting: Student in an Organized Health Care Education/Training Program

## 2017-12-09 NOTE — Telephone Encounter (Signed)
Rtc, lm for rtc 

## 2017-12-09 NOTE — Telephone Encounter (Signed)
Patient is allergic to a medicine, the physician told patient it was only 1%, patient is scared. Pls find other medicine not in that family. Pls call patient

## 2017-12-12 DIAGNOSIS — J9601 Acute respiratory failure with hypoxia: Secondary | ICD-10-CM | POA: Diagnosis not present

## 2017-12-14 NOTE — Telephone Encounter (Signed)
Called pt - no answer; left message to call us back. 

## 2017-12-15 ENCOUNTER — Telehealth: Payer: Self-pay | Admitting: *Deleted

## 2017-12-15 NOTE — Telephone Encounter (Signed)
Letter written and left with the [facility]. Thanks!

## 2017-12-15 NOTE — Telephone Encounter (Signed)
Letter faxed to Dr Paralee Cancel office [phone]).

## 2017-12-15 NOTE — Telephone Encounter (Signed)
Pt states she's allergic to Lisinopril and was prescribed Losartan - states she's afraid to take the medication even she was told there's only 1% chance of her having an allergic reaction. Requesting a change in medication. Thanks

## 2017-12-15 NOTE — Telephone Encounter (Signed)
Ok. Thanks for letting me know. Unfortunately this is not an easy situation where we can just swap in another class. I will talk with the patient about it at our next visit in person.

## 2017-12-15 NOTE — Telephone Encounter (Signed)
Pt stated she's having dental work done and is on Xarelto and needs ok from her doctor.  I called Dr Paralee Cancel office [phone]). Spoke to Jasmine December - stated pt is having a simple cleaning done on June 3 ; needs a letter stating when pt needs to stop/re-start Xarelto or if it does not need to be stop. Fax # [phone].

## 2017-12-16 NOTE — Telephone Encounter (Signed)
Called pt - no answer; left message to call us back about her medication concern.

## 2017-12-17 ENCOUNTER — Telehealth: Payer: Self-pay | Admitting: Student in an Organized Health Care Education/Training Program

## 2017-12-17 NOTE — Telephone Encounter (Signed)
Called pt - no answer; left message to give us a call back. 

## 2017-12-17 NOTE — Telephone Encounter (Signed)
Patient is returning a call, pls call patient back

## 2017-12-20 ENCOUNTER — Telehealth: Payer: Self-pay | Admitting: *Deleted

## 2017-12-20 NOTE — Telephone Encounter (Signed)
Patient called in. Made aware that Dr. Oswaldo Done will discuss BP med options in person at next OV sched 01/10/2018. Kinnie Feil, RN, BSN

## 2017-12-20 NOTE — Telephone Encounter (Signed)
Please see refill note addendum from 12/09/2017.  Kinnie Feil, RN, BSN

## 2017-12-23 ENCOUNTER — Telehealth: Payer: Self-pay | Admitting: *Deleted

## 2017-12-23 NOTE — Telephone Encounter (Signed)
Dr Paralee Cancel office stated they did not receive letter,which was faxed twice, from Dr Oswaldo Done re: upcoming dental cleaning. I re-faxed letter again; called Dr Paralee Cancel office - stated it was received and she will attach it to the pt's chart. I called pt - no answer; left message letter has been faxed and received.

## 2018-01-10 ENCOUNTER — Ambulatory Visit (INDEPENDENT_AMBULATORY_CARE_PROVIDER_SITE_OTHER): Payer: Medicare Other | Admitting: Student in an Organized Health Care Education/Training Program

## 2018-01-10 ENCOUNTER — Ambulatory Visit: Payer: Medicare Other | Admitting: Dietician

## 2018-01-10 ENCOUNTER — Other Ambulatory Visit: Payer: Self-pay

## 2018-01-10 ENCOUNTER — Other Ambulatory Visit: Payer: Self-pay | Admitting: Student in an Organized Health Care Education/Training Program

## 2018-01-10 ENCOUNTER — Encounter: Payer: Self-pay | Admitting: Dietician

## 2018-01-10 ENCOUNTER — Encounter: Payer: Self-pay | Admitting: Student in an Organized Health Care Education/Training Program

## 2018-01-10 VITALS — BP 148/68 | HR 68 | Temp 98.0°F | Ht 63.0 in | Wt 306.6 lb

## 2018-01-10 DIAGNOSIS — Z905 Acquired absence of kidney: Secondary | ICD-10-CM

## 2018-01-10 DIAGNOSIS — I10 Essential (primary) hypertension: Secondary | ICD-10-CM

## 2018-01-10 DIAGNOSIS — Z841 Family history of disorders of kidney and ureter: Secondary | ICD-10-CM

## 2018-01-10 DIAGNOSIS — Z6841 Body Mass Index (BMI) 40.0 and over, adult: Secondary | ICD-10-CM | POA: Diagnosis not present

## 2018-01-10 DIAGNOSIS — Z79899 Other long term (current) drug therapy: Secondary | ICD-10-CM

## 2018-01-10 DIAGNOSIS — G4733 Obstructive sleep apnea (adult) (pediatric): Secondary | ICD-10-CM

## 2018-01-10 MED ORDER — DOXAZOSIN MESYLATE 1 MG PO TABS: 1.0000 mg | ORAL_TABLET | Freq: Every day | ORAL | 5 refills | Status: DC

## 2018-01-10 NOTE — Assessment & Plan Note (Addendum)
Complicated issue, blood pressure is not at goal today.  Currently on maximum dose of amlodipine, carvedilol, and furosemide 80 mg daily.  She has an allergy listed to hydralazine, angioedema to ACE inhibitor, I tried to initiate losartan a month ago at her last visit, but even the very small risk of a ARB causing angioedema was too much for her and she did not start the medication.  We cannot use thiazides because of history of nephrolithiasis and single kidney state.  At this point I am going to suggest doxazosin 1 mg daily.  We talked about the risks of orthostatic dizziness.

## 2018-01-10 NOTE — Patient Instructions (Signed)
Doxazosin tablets What is this medicine? DOXAZOSIN (dox AY zoe sin) is an antihypertensive. It works by relaxing the blood vessels. It is used to treat benign prostatic hyperplasia (BPH) in men and to treat high blood pressure in both men and women. This medicine may be used for other purposes; ask your health care provider or pharmacist if you have questions. COMMON BRAND NAME(S): Cardura What should I tell my health care provider before I take this medicine? They need to know if you have any of the following conditions: -kidney or liver disease -low blood pressure -an unusual or allergic reaction to doxazosin, other medicines, foods, dyes, or preservatives -pregnant or trying to get pregnant -breast-feeding How should I use this medicine? Take this medicine by mouth with a glass of water. Follow the directions on the prescription label. Take your doses at regular intervals. Do not take your medicine more often than directed. Do not stop taking except on the advice of your doctor or health care professional. Talk to your pediatrician regarding the use of this medicine in children. Special care may be needed. Overdosage: If you think you have taken too much of this medicine contact a poison control center or emergency room at once. NOTE: This medicine is only for you. Do not share this medicine with others. What if I miss a dose? If you miss a dose, take it as soon as you can. If it is almost time for your next dose, take only that dose. Do not take double or extra doses. What may interact with this medicine? -cimetidine -medicines for colds or hay fever -medicines for overactive bladder -sildenafil -tadalafil -vardenafil This list may not describe all possible interactions. Give your health care provider a list of all the medicines, herbs, non-prescription drugs, or dietary supplements you use. Also tell them if you smoke, drink alcohol, or use illegal drugs. Some items may interact with  your medicine. What should I watch for while using this medicine? Visit your doctor or health care professional for regular checks on your progress. Check your blood pressure regularly. Ask your doctor or health care professional what your blood pressure should be and when you should contact him or her. Drowsiness and dizziness are more likely to occur after the first dose, after an increase in dose, or during hot weather or exercise. These effects can decrease once your body adjusts to this medicine. Do not drive, use machinery, or do anything that needs mental alertness until you know how this drug affects you. Do not stand or sit up quickly, especially if you are an older patient. This reduces the risk of dizzy or fainting spells. Alcohol can make you more drowsy and dizzy. Avoid alcoholic drinks. Do not treat yourself for coughs, colds, or pain while you are taking this medicine without asking your doctor or health care professional for advice. Some ingredients may increase your blood pressure. Your mouth may get dry. Chewing sugarless gum or sucking hard candy, and drinking plenty of water may help. Contact your doctor if the problem does not go away or is severe. If you are going to have eye surgery for cataracts, tell your doctor or health care professional you are taking this medicine. A condition called Intraoperative Floppy Iris Syndrome (IFIS) can happen if you have taken this medicine. For males, contact your doctor or health care professional right away if you have an erection that lasts longer than 4 hours or if it becomes painful. This may be a sign of   a serious problem and must be treated right away to prevent permanent damage. What side effects may I notice from receiving this medicine? Side effects that you should report to your doctor or health care professional as soon as possible: -allergic reactions like skin rash, itching or hives, swelling of the face, lips, or tongue -breathing  problems -changes in vision -chest pain -fast or irregular heartbeat -feeling faint or lightheaded, falls -males: prolonged or painful erection -numbness in hands or feet -swelling of the legs or ankles -unusually weak or tired Side effects that usually do not require medical attention (report to your doctor or health care professional if they continue or are bothersome): -headache -nausea This list may not describe all possible side effects. Call your doctor for medical advice about side effects. You may report side effects to FDA at 1-800-FDA-1088. Where should I keep my medicine? Keep out of the reach of children. Store at room temperature below 30 degrees C (86 degrees F). Throw away any unused medicine after the expiration date. NOTE: This sheet is a summary. It may not cover all possible information. If you have questions about this medicine, talk to your doctor, pharmacist, or health care provider.  2018 Elsevier/Gold Standard (2012-07-20 14:02:30)  

## 2018-01-10 NOTE — Progress Notes (Signed)
Medical Nutrition Therapy:  Appt start time: 0845 end time:  0915. Visit # 1  Assessment:  Primary concerns today: weight loss.  Ms. Tukes wants to try to follow a 1200 calorie meal plan for a month. She asks for help doing this. Her assessed calorie needs show that weight loss should be able to be achieved at ~1800-2000 calories per day. She asks what foods contain fat and why that is important to weight loss. She reports no added salt and low sodium food choicese, mostly eating meat and vegetables.  Preferred Learning Style: No preference indicated  Learning Readiness: Ready and  Change in progress  ANTHROPOMETRICS: Estimated body mass index is 54.31 kg/m as calculated from the following:   Height as of an earlier encounter on 01/10/18: 5\' 3"  (1.6 m).   Weight as of an earlier encounter on 01/10/18: 306 lb 9.6 oz (139.1 kg).  WEIGHT HISTORY:  Wt Readings from Last 3 Encounters:  01/10/18 (!) 306 lb 9.6 oz (139.1 kg)  11/29/17 299 lb 14.4 oz (136 kg)  10/25/17 (!) 308 lb 12.8 oz (140.1 kg)    SLEEP:has sleep apnea,   MEDICATIONS:  Current Outpatient Medications on File Prior to Visit  Medication Sig Dispense Refill  . acetaminophen (TYLENOL) 325 MG tablet Take 975 mg by mouth every 6 (six) hours as needed for moderate pain or headache.    . albuterol (PROAIR HFA) 108 (90 Base) MCG/ACT inhaler INHALE 2 PUFFS INTO THE LUNGS EVERY 4 HOURS AS NEEDED FOR WHEEZING OR SHORTNESS OF BREATH 8.5 g 3  . allopurinol (ZYLOPRIM) 300 MG tablet Take 300 mg by mouth daily.  3  . amLODipine (NORVASC) 10 MG tablet TAKE 1 TABLET(10 MG) BY MOUTH DAILY 90 tablet 1  . carvedilol (COREG) 25 MG tablet Take 1 tablet (25 mg total) by mouth 2 (two) times daily. 60 tablet 5  . doxazosin (CARDURA) 1 MG tablet Take 1 tablet (1 mg total) by mouth daily. 30 tablet 5  . fluticasone (FLOVENT HFA) 44 MCG/ACT inhaler Inhale 2 puffs into the lungs daily. 1 Inhaler 5  . furosemide (LASIX) 80 MG tablet Take 1 tablet (80 mg  total) by mouth daily. Please note tablet dose change.  Take only 1 tablet daily. 90 tablet 3  . loperamide (IMODIUM) 2 MG capsule TAKE 1 CAPSULE BY MOUTH  DAILY AS NEEDED FOR  DIARRHEA OR LOOSE STOOLS 60 capsule 0  . Potassium Citrate 15 MEQ (1620 MG) TBCR Take 1 tablet by mouth 2 (two) times daily. 120 tablet 4  . rivaroxaban (XARELTO) 10 MG TABS tablet Take 1 tablet (10 mg total) by mouth daily. 90 tablet 3   No current facility-administered medications on file prior to visit.     Labs:  Lipid Panel     Component Value Date/Time   CHOL 181 07/05/2017 0947   TRIG 114 07/05/2017 0947   HDL 52 07/05/2017 0947   CHOLHDL 3.5 07/05/2017 0947   CHOLHDL 3.2 02/14/2015 0953   VLDL 19 02/14/2015 0953   LDLCALC 106 (H) 07/05/2017 0947   Lab Results  Component Value Date   HGBA1C 5.4 07/06/2016   BP Readings from Last 3 Encounters:  01/10/18 (!) 148/68  11/29/17 135/70  10/25/17 (!) 148/82   DIETARY INTAKE: Usual eating pattern includes 2 meals and snacks per day not assessed. Everyday foods include meat and vegetables. The following were need to be assessed at a future visit: food intolerances,bowel habits, hair loss and Dining Out (times/week).  24-hr  recall:not done today due to limited time  Usual physical activity: need to assess at future visit  Estimated daily energy needs:  ~2000  Calories/day for weight loss (BMR 1852 kcal/day according to [employer] formula)    Progress Towards Goal(s):  In progress.   Nutritional Diagnosis:  NB-1.1 Food and nutrition-related knowledge deficit As related to lack of sufficent prior meal planning education.  As evidenced by her questions.    Intervention:  Nutrition education and counseling about weight loss, her calorie needs and basics about macronutrients, Coordination of care: agree 1200 too extreme of a calorie level for this patient. Strongly encourage continued follow up appointment with dietitian.  Teaching Method  Utilized: Visual, Auditory, Hands on Handouts given during visit include:avs- weight loss book, food record and calories and fat grams counting resource Barriers to learning/adherence to lifestyle change: competing values, need for support Demonstrated degree of understanding via:  Teach Back   Monitoring/Evaluation:  Dietary intake, exercise, food record, and body weight I recommended 2 weeks, but patient was not ready to schedule. Norm Parcelonna Plyler, RD 01/10/2018 12:18 PM.

## 2018-01-10 NOTE — Telephone Encounter (Signed)
Next appt scheduled  7/22 with PCP. 

## 2018-01-10 NOTE — Progress Notes (Signed)
   Assessment and Plan:  See Encounters tab for problem-based medical decision making.   __________________________________________________________  HPI:   77 year old woman here for follow-up of hypertension.  She also has morbid obesity and uncontrolled obstructive sleep apnea.  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  At her last visit we discussed the need to initiate another blood pressure agent given persistent hypertension in our office visits.  I suggested losartan even though she had a history of angioedema to ACE inhibitor.  I quoted her a very low risk of having the same type of reaction to a uncertain percent chance.  She was okay with that at that visit, but after thinking about it when she got home she decided ARB, not to initiate the medication.  She was scared of the reaction happening again.  She reports being in her usual state of health.  No chest pain or shortness of breath.  No fevers or chills.  No recent illnesses.  Her brother is only 2 years older than her past away last month due to end-stage renal disease.  __________________________________________________________  Problem List: Patient Active Problem List   Diagnosis Date Noted  . Hypercoagulable state (HCC)     Priority: High  . Sleep apnea 07/08/2015    Priority: High  . Bilateral lower extremity edema 01/09/2014    Priority: Medium  . Hyperuricemia 05/25/2013    Priority: Medium  . Nephrolithiasis 05/25/2013    Priority: Medium  . Hypertension 12/22/2011    Priority: Medium  . Obesity, Class III, BMI 40-49.9 (morbid obesity) (HCC) 12/22/2011    Priority: Medium  . Asthma 12/04/2011    Priority: Low  . 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 Routine health maintenance 09/15/2011    Priority: Low    Medications: Reconciled today in  Epic __________________________________________________________  Physical Exam:  Vital Signs: Blood pressure 148/68, pulse 68 Gen: Well appearing, NAD Ext: Warm, very large nonpitting edema a Skin: No atypical appearing moles. No rashes Neuro: lert and oriented, wide-based slow gait,in bilateral legs, lower normal strength upper lower extremities

## 2018-01-10 NOTE — Patient Instructions (Addendum)
Dear Tina Chase,   I can tell you are determined. I would like to help you as much as I can.  I included some blank sheets for you to record your food intake for 3 days. Please complete those and bring back to your follow up appointment.  Nice to meet you! Please call anytime.   Lupita Leash Plyler [phone]     Calories for Weight Loss Calories are units of energy. Your body needs a certain amount of calories from food to keep you going throughout the day. When you eat more calories than your body needs, your body stores the extra calories as fat. When you eat fewer calories than your body needs, your body burns fat to get the energy it needs. Calorie counting means keeping track of how many calories you eat and drink each day. Calorie counting can be helpful if you need to lose weight. If you make sure to eat fewer calories than your body needs, you should lose weight. Ask your health care provider what a healthy weight is for you. For calorie counting to work, you will need to eat the right number of calories in a day in order to lose a healthy amount of weight per week. A dietitian can help you determine how many calories you need in a day and will give you suggestions on how to reach your calorie goal.   A healthy amount of weight to lose per week is usually 1-2 lb (0.5-0.9 kg). This usually means that your daily calorie intake should be reduced by 500-750 calories. .  To successfully lose weight, it is important to balance calorie counting with a healthy lifestyle that includes regular activity. Aim for 150 minutes of moderate exercise (such as walking) each week.   What are some portion control tips?  Know how many calories are in a serving. This will help you know how many servings of a certain food you can have.  Use a measuring cup to measure serving sizes. You could also try weighing out portions on a kitchen scale. With time, you will be able to estimate serving sizes for some  foods.  Take some time to put servings of different foods on your favorite plates, bowls, and cups so you know what a serving looks like.  Try not to eat straight from a bag or box. Doing this can lead to overeating. Put the amount you would like to eat in a cup or on a plate to make sure you are eating the right portion.  Use smaller plates, glasses, and bowls to prevent overeating. Try not to multitask (for example, watch TV or use your computer) while eating. If it is time to eat, sit down at a table and enjoy your food. This will help you to know when you are full. It will also help you to be aware of what you are eating and how much you are eating.   What are tips for following this plan? Reading food labels  Check the calorie count compared to the serving size. The serving size may be smaller than what you are used to eating.  Check the source of the calories. Make sure the food you are eating is high in vitamins and protein and low in saturated and trans fats. Shopping  Read nutrition labels while you shop. This will help you make healthy decisions before you decide to purchase your food.  Make a grocery list and stick to it. Cooking  Try to cook your  favorite foods in a healthier way. For example, try baking instead of frying.  Use low-fat dairy products. Meal planning  Use more fruits and vegetables. Half of your plate should be fruits and vegetables.  Include lean proteins like poultry and fish. ?  Summary   If you eat fewer calories than your body needs, you should lose weight.  A healthy amount of weight to lose per week is usually 1-2 lb (0.5-0.9 kg). This usually means reducing your daily calorie intake by 500-750 calories.  The number of calories in a food can be found on a Nutrition Facts label. If a food does not have a Nutrition Facts label, try to look up the calories online or ask your dietitian for help.  Use your calories on foods and drinks that will  fill you up, and not on foods and drinks that will leave you hungry. Use smaller plates, glasses, and bowls to prevent overeating.

## 2018-01-10 NOTE — Assessment & Plan Note (Signed)
Persistent obesity, some success recently with weight loss but still BMI over 40.  Her obesity causes lymphedema in her lower extremities and she cannot tolerate compression stockings.  We talked for a few minutes about her nutrition, she feels like she eats mostly healthy foods.  Plan is to refer her to our dietitian, she continues to be interested in 1200-calorie daily diet which I think is a little too extreme given her advanced age.

## 2018-01-12 DIAGNOSIS — J9601 Acute respiratory failure with hypoxia: Secondary | ICD-10-CM | POA: Diagnosis not present

## 2018-02-11 DIAGNOSIS — J9601 Acute respiratory failure with hypoxia: Secondary | ICD-10-CM | POA: Diagnosis not present

## 2018-02-21 ENCOUNTER — Ambulatory Visit: Payer: Medicare Other | Admitting: Dietician

## 2018-02-21 ENCOUNTER — Encounter: Payer: Self-pay | Admitting: Student in an Organized Health Care Education/Training Program

## 2018-02-21 ENCOUNTER — Ambulatory Visit (INDEPENDENT_AMBULATORY_CARE_PROVIDER_SITE_OTHER): Payer: Medicare Other | Admitting: Student in an Organized Health Care Education/Training Program

## 2018-02-21 VITALS — BP 149/78 | HR 83 | Temp 98.0°F | Ht 63.0 in | Wt 308.6 lb

## 2018-02-21 DIAGNOSIS — Z79899 Other long term (current) drug therapy: Secondary | ICD-10-CM | POA: Diagnosis not present

## 2018-02-21 DIAGNOSIS — Z6841 Body Mass Index (BMI) 40.0 and over, adult: Secondary | ICD-10-CM | POA: Diagnosis not present

## 2018-02-21 DIAGNOSIS — G4730 Sleep apnea, unspecified: Secondary | ICD-10-CM

## 2018-02-21 DIAGNOSIS — I10 Essential (primary) hypertension: Secondary | ICD-10-CM | POA: Diagnosis not present

## 2018-02-21 DIAGNOSIS — G4733 Obstructive sleep apnea (adult) (pediatric): Secondary | ICD-10-CM

## 2018-02-21 NOTE — Assessment & Plan Note (Signed)
Complex and difficult situation to manage we tried adding doxazosin but she has been unable to tolerate because of perceived side effects.  Plan is to continue with amlodipine 10, carvedilol 25 twice daily, Lasix 80.  In eligible for thiazide diuretics due to nephrolithiasis and single kidney.  ACE and ARB contraindicated because of history of angioedema.  We talked about recommitting to treatment of her obstructive sleep apnea with CPAP, I think this could be enough to lower her blood pressure back to goal.

## 2018-02-21 NOTE — Assessment & Plan Note (Signed)
Patient with a lot of difficulty tolerating CPAP because of mask fits.  She recommitted again today to try to improve her compliance with the CPAP machine.  I think this could be overall very helpful for her in the future.

## 2018-02-21 NOTE — Progress Notes (Signed)
Patient did not stay for her appointment.

## 2018-02-21 NOTE — Assessment & Plan Note (Signed)
Stable weight.  We talked about improving nutrition, increase vegetables, and fruits, reduce salt intake.  Follow-up in 3 months for weight recheck.  Check weight at home as well.

## 2018-02-21 NOTE — Progress Notes (Signed)
   Assessment and Plan:  See Encounters tab for problem-based medical decision making.   __________________________________________________________  HPI:   77 year old woman here for follow-up of hypertension.  I saw her last month and we had to resort to starting doxazosin for her uncontrolled blood pressure.  She had difficulty with this medication since that time.  Reports feeling more shortness of breath.  Also reports feeling lightheaded and dizzy.  Says she does not like taking the medication, causes fatigue.  She reports still being compliant with the medicine, takes around 10 PM.  Denies any chest pain or shortness of breath.  No falls or syncope.  Does not check her blood pressure at home, says she needs batteries for her cuff.  Still not using CPAP machine because of the mask is not comfortable.  Reports good compliance with her other medications without difficulty.   __________________________________________________________  Problem List: Patient Active Problem List   Diagnosis Date Noted  . Hypercoagulable state (HCC)     Priority: High  . Sleep apnea 07/08/2015    Priority: High  . Bilateral lower extremity edema 01/09/2014    Priority: Medium  . Hyperuricemia 05/25/2013    Priority: Medium  . Nephrolithiasis 05/25/2013    Priority: Medium  . Hypertension 12/22/2011    Priority: Medium  . Obesity, Class III, BMI 40-49.9 (morbid obesity) (HCC) 12/22/2011    Priority: Medium  . Asthma 12/04/2011    Priority: Low  . 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

## 2018-02-21 NOTE — Patient Instructions (Signed)
We talked about your high blood pressure today.  As the side effects are too high for you.You may stop taking doxazosin.  We set a goal of improving your nutrition, reduce salt intake, limit fluid intake to no more than 2 L/day.  Please measure your weight each day, also start measuring her blood pressure a few times a week.  We set a goal of improving your use of the CPAP machine.  This could be very helpful in controlling your blood pressure and your fluid.

## 2018-02-24 ENCOUNTER — Other Ambulatory Visit: Payer: Self-pay | Admitting: Student in an Organized Health Care Education/Training Program

## 2018-02-28 ENCOUNTER — Other Ambulatory Visit: Payer: Self-pay | Admitting: Student in an Organized Health Care Education/Training Program

## 2018-03-07 ENCOUNTER — Other Ambulatory Visit: Payer: Self-pay | Admitting: Student in an Organized Health Care Education/Training Program

## 2018-03-14 DIAGNOSIS — J9601 Acute respiratory failure with hypoxia: Secondary | ICD-10-CM | POA: Diagnosis not present

## 2018-04-11 ENCOUNTER — Other Ambulatory Visit: Payer: Self-pay | Admitting: Student in an Organized Health Care Education/Training Program

## 2018-04-12 NOTE — Telephone Encounter (Signed)
Looks like it was first prescribed on 09/09/2015 but there is no reference to it in the note.  It appears in the AVS for that visit.  I am unable to find the medication linked to any diagnosis or a problem on the problem list it may be associated with.  I will not refill this medication at this time, but defer to her PCP to determine if it still is indicated and should be renewed.

## 2018-04-14 DIAGNOSIS — J9601 Acute respiratory failure with hypoxia: Secondary | ICD-10-CM | POA: Diagnosis not present

## 2018-05-14 DIAGNOSIS — J9601 Acute respiratory failure with hypoxia: Secondary | ICD-10-CM | POA: Diagnosis not present

## 2018-05-23 ENCOUNTER — Ambulatory Visit (INDEPENDENT_AMBULATORY_CARE_PROVIDER_SITE_OTHER): Payer: Medicare Other | Admitting: Student in an Organized Health Care Education/Training Program

## 2018-05-23 ENCOUNTER — Encounter: Payer: Self-pay | Admitting: Student in an Organized Health Care Education/Training Program

## 2018-05-23 VITALS — BP 149/71 | HR 72 | Temp 97.8°F | Wt 311.0 lb

## 2018-05-23 DIAGNOSIS — Z86711 Personal history of pulmonary embolism: Secondary | ICD-10-CM

## 2018-05-23 DIAGNOSIS — Z9114 Patient's other noncompliance with medication regimen: Secondary | ICD-10-CM

## 2018-05-23 DIAGNOSIS — Z7901 Long term (current) use of anticoagulants: Secondary | ICD-10-CM

## 2018-05-23 DIAGNOSIS — D6859 Other primary thrombophilia: Secondary | ICD-10-CM | POA: Diagnosis not present

## 2018-05-23 DIAGNOSIS — E790 Hyperuricemia without signs of inflammatory arthritis and tophaceous disease: Secondary | ICD-10-CM | POA: Diagnosis not present

## 2018-05-23 DIAGNOSIS — G4730 Sleep apnea, unspecified: Secondary | ICD-10-CM | POA: Diagnosis not present

## 2018-05-23 DIAGNOSIS — Z9111 Patient's noncompliance with dietary regimen: Secondary | ICD-10-CM

## 2018-05-23 DIAGNOSIS — R6889 Other general symptoms and signs: Secondary | ICD-10-CM | POA: Insufficient documentation

## 2018-05-23 DIAGNOSIS — Z23 Encounter for immunization: Secondary | ICD-10-CM

## 2018-05-23 DIAGNOSIS — Z79899 Other long term (current) drug therapy: Secondary | ICD-10-CM

## 2018-05-23 DIAGNOSIS — N200 Calculus of kidney: Secondary | ICD-10-CM

## 2018-05-23 DIAGNOSIS — I10 Essential (primary) hypertension: Secondary | ICD-10-CM

## 2018-05-23 DIAGNOSIS — Z9119 Patient's noncompliance with other medical treatment and regimen: Secondary | ICD-10-CM

## 2018-05-23 DIAGNOSIS — Z6841 Body Mass Index (BMI) 40.0 and over, adult: Secondary | ICD-10-CM

## 2018-05-23 DIAGNOSIS — Z905 Acquired absence of kidney: Secondary | ICD-10-CM

## 2018-05-23 MED ORDER — POTASSIUM CITRATE ER 15 MEQ (1620 MG) PO TBCR: 1.0000 | EXTENDED_RELEASE_TABLET | Freq: Two times a day (BID) | ORAL | 4 refills | Status: DC

## 2018-05-23 NOTE — Assessment & Plan Note (Signed)
Weight is stable at 311 pounds.  Poor compliance with nutrition or exercise interventions.  We are limited by very poor motivation, I encouraged her to do more exercises at her senior living facility which has a lot of offerings for her in the community.

## 2018-05-23 NOTE — Assessment & Plan Note (Signed)
Stable, no history of gout.  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  Will check uric acid today, continue with allopurinol 300 mg daily.

## 2018-05-23 NOTE — Assessment & Plan Note (Signed)
Very difficult situation of uncontrolled hypertension due to metabolic syndrome and uncontrolled sleep apnea.  She has only intermittent compliance with current regimen which includes amlodipine 10, carvedilol 25 twice daily, and Lasix 80 mg daily.  Unable to use thiazide diuretics because of history of nephrolithiasis and solitary kidney.  No ACE or arm because of history of angioedema.  No hydralazine because of history of anaphylaxis and tongue swelling.  We talked about using Spironolactone, she has good renal function but is using potassium citrate twice daily for stone prevention, I would not want to combine these 2 medications for risk of hyperkalemia.  I do not want to add clonidine to her full dose carvedilol.  We try doxazosin and she had perceived side effect of lightheadedness.  At this point we feel like her almost out of options, after discussing for a while she decided she wants to keep her current medications.  She understands the risk we are running with this poorly controlled hypertension, including risk of heart disease

## 2018-05-23 NOTE — Progress Notes (Signed)
   Assessment and Plan:  See Encounters tab for problem-based medical decision making.   __________________________________________________________  HPI:   77 year old woman here for follow-up of hypertension.  Reports doing okay at home, reports intermittent compliance with her blood pressure medications.  For example she does not take Lasix on a day that she is going to be leaving the house.  This makes her urinate too much.  No lightheadedness.  Says she checks her blood pressure at home and sees readings that are often high, above 140 systolic.  No chest pain, no dyspnea on exertion, orthopnea, or PND.  No fevers or chills.  No recent falls.  Lives at a senior community facility that has several outings for her.  Feels unmotivated to exercise.  No changes in her diet recently.  Still not compliant with CPAP machine.  Has not contacted the company to improve her mask fitting.  Says she just is not motivated to do any better.  __________________________________________________________  Problem List: Patient Active Problem List   Diagnosis Date Noted  . Hypercoagulable state (HCC)     Priority: High  . Sleep apnea 07/08/2015    Priority: High  . Bilateral lower extremity edema 01/09/2014    Priority: Medium  . Hyperuricemia 05/25/2013    Priority: Medium  . Nephrolithiasis 05/25/2013    Priority: Medium  . Hypertension 12/22/2011    Priority: Medium  . Obesity, Class III, BMI 40-49.9 (morbid obesity) (HCC) 12/22/2011    Priority: Medium  . Asthma 12/04/2011    Priority: Low  . 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 Routine health maintenance 09/15/2011    Priority: Low    Medications: Reconciled today in Epic __________________________________________________________  Physical Exam:  Vital Signs: Vitals:   05/23/18 0906  BP: (!) 149/71  Pulse: 72  Temp: 97.8 F (36.6 C)  TempSrc: Oral  SpO2: 96%  Weight: (!) 311 lb (141.1 kg)    Gen: Well appearing, NAD,  wearing a jacket CV: RRR, no murmurs Pulm: Normal effort, CTA throughout, no wheezing Ext: Warm, chronic 2+ nonpitting edema bilaterally Psychiatric: Depressed appearing affect

## 2018-05-23 NOTE — Patient Instructions (Signed)
Today we talked about her blood pressure, it is still high.  Are very limited on what other medicines we can use for you.  Please continue your current medicines and we talked about improving your daily compliance.  We talked about your sleep apnea, the need to work with the company to find a mask that is can be comfortable for him.  He talked about improving exercise, activity, and nutrition.

## 2018-05-23 NOTE — Assessment & Plan Note (Signed)
Stable on low-dose rivaroxaban, last unprovoked PE was 2017.  Plan to check renal function today.  Continue with Xarelto 10 mg daily.

## 2018-05-24 ENCOUNTER — Encounter: Payer: Self-pay | Admitting: Student in an Organized Health Care Education/Training Program

## 2018-05-24 LAB — BMP8+ANION GAP
ANION GAP: 14 mmol/L (ref 10.0–18.0)
BUN/Creatinine Ratio: 13 (ref 12–28)
BUN: 10 mg/dL (ref 8–27)
CALCIUM: 9.7 mg/dL (ref 8.7–10.3)
CHLORIDE: 98 mmol/L (ref 96–106)
CO2: 30 mmol/L — AB (ref 20–29)
Creatinine, Ser: 0.76 mg/dL (ref 0.57–1.00)
GFR calc Af Amer: 89 mL/min/{1.73_m2} (ref >59)
GFR calc non Af Amer: 77 mL/min/{1.73_m2} (ref >59)
Glucose: 93 mg/dL (ref 65–99)
POTASSIUM: 4.3 mmol/L (ref 3.5–5.2)
Sodium: 142 mmol/L (ref 134–144)

## 2018-05-24 LAB — URIC ACID: Uric Acid: 3.5 mg/dL (ref 2.5–7.1)

## 2018-05-24 LAB — TSH: TSH: 1.61 u[IU]/mL (ref 0.450–4.500)

## 2018-06-14 DIAGNOSIS — J9601 Acute respiratory failure with hypoxia: Secondary | ICD-10-CM | POA: Diagnosis not present

## 2018-07-07 ENCOUNTER — Other Ambulatory Visit: Payer: Self-pay | Admitting: *Deleted

## 2018-07-07 MED ORDER — LOPERAMIDE HCL 2 MG PO CAPS: 2.0000 mg | ORAL_CAPSULE | ORAL | 0 refills | Status: DC | PRN

## 2018-07-08 ENCOUNTER — Other Ambulatory Visit: Payer: Self-pay

## 2018-07-08 MED ORDER — LOPERAMIDE HCL 2 MG PO CAPS: 2.0000 mg | ORAL_CAPSULE | Freq: Four times a day (QID) | ORAL | 0 refills | Status: DC | PRN

## 2018-07-08 NOTE — Telephone Encounter (Signed)
Received [facility] from [facility] for order clarification on loperamide 2 mg RX.   Please include frequency of PRN administration on RX and resend to [facility].   Thanks! SChaplin, RN,BSN

## 2018-07-11 ENCOUNTER — Other Ambulatory Visit: Payer: Self-pay | Admitting: Student in an Organized Health Care Education/Training Program

## 2018-07-11 DIAGNOSIS — Z1231 Encounter for screening mammogram for malignant neoplasm of breast: Secondary | ICD-10-CM

## 2018-07-12 ENCOUNTER — Other Ambulatory Visit: Payer: Self-pay | Admitting: Student in an Organized Health Care Education/Training Program

## 2018-07-12 NOTE — Telephone Encounter (Signed)
Next appt scheduled 09/19/18 with PCP. 

## 2018-07-14 DIAGNOSIS — J9601 Acute respiratory failure with hypoxia: Secondary | ICD-10-CM | POA: Diagnosis not present

## 2018-08-14 DIAGNOSIS — J9601 Acute respiratory failure with hypoxia: Secondary | ICD-10-CM | POA: Diagnosis not present

## 2018-08-16 ENCOUNTER — Ambulatory Visit
Admission: RE | Admit: 2018-08-16 | Discharge: 2018-08-16 | Disposition: A | Discharge status: Home / Self Care | Payer: Medicare Other | Source: Ambulatory Visit | Attending: Student in an Organized Health Care Education/Training Program | Admitting: Student in an Organized Health Care Education/Training Program

## 2018-08-16 DIAGNOSIS — Z1231 Encounter for screening mammogram for malignant neoplasm of breast: Secondary | ICD-10-CM

## 2018-08-25 ENCOUNTER — Encounter: Payer: Self-pay | Admitting: *Deleted

## 2018-08-29 ENCOUNTER — Other Ambulatory Visit: Payer: Self-pay | Admitting: Student in an Organized Health Care Education/Training Program

## 2018-08-29 DIAGNOSIS — R600 Localized edema: Secondary | ICD-10-CM

## 2018-09-14 DIAGNOSIS — J9601 Acute respiratory failure with hypoxia: Secondary | ICD-10-CM | POA: Diagnosis not present

## 2018-09-19 ENCOUNTER — Ambulatory Visit: Payer: Medicare Other | Admitting: Student in an Organized Health Care Education/Training Program

## 2018-10-12 DIAGNOSIS — H353131 Nonexudative age-related macular degeneration, bilateral, early dry stage: Secondary | ICD-10-CM | POA: Diagnosis not present

## 2018-10-12 DIAGNOSIS — H1851 Endothelial corneal dystrophy: Secondary | ICD-10-CM | POA: Diagnosis not present

## 2018-10-12 DIAGNOSIS — H2513 Age-related nuclear cataract, bilateral: Secondary | ICD-10-CM | POA: Diagnosis not present

## 2018-10-13 DIAGNOSIS — J9610 Chronic respiratory failure, unspecified whether with hypoxia or hypercapnia: Secondary | ICD-10-CM | POA: Diagnosis not present

## 2018-10-13 DIAGNOSIS — S301XXA Contusion of abdominal wall, initial encounter: Secondary | ICD-10-CM | POA: Diagnosis not present

## 2018-10-13 DIAGNOSIS — T8189XA Other complications of procedures, not elsewhere classified, initial encounter: Secondary | ICD-10-CM | POA: Diagnosis not present

## 2018-10-13 DIAGNOSIS — J9601 Acute respiratory failure with hypoxia: Secondary | ICD-10-CM | POA: Diagnosis not present

## 2018-10-20 ENCOUNTER — Telehealth: Payer: Self-pay | Admitting: Student in an Organized Health Care Education/Training Program

## 2018-10-20 NOTE — Telephone Encounter (Signed)
Due to the COVID-19 pandemic I called Tina Chase today to have a brief telephone check-in and reschedule our routine visit that was coming up next week.  Tina Chase is a 78 year old person living with obesity, hypertension, and hypercoagulable states.  She reports doing very well at home.  Says that she recently was able to get back on CPAP therapy for struct of sleep apnea, symptomatically feeling much better.  Feels like she gets a good night sleep and feels more energized during the day.  She reports good compliance with her medications with no adverse side effects.  She checks her blood pressure regularly at home and says that the readings have been around 110-120.  Denies any easy bleeding or bruising on Xarelto therapy.  Renal function was checked 5 months ago and was normal.  I gave her advice on appropriate social distancing and ways to reduce her risk of infection with the novel coronavirus.  Patient understands, currently living in assisted living apartments but is trying to socially distance herself from large crowds.  Also canceled travel.  Advised to follow-up with me in 2 to 3 months when the restrictions are likely to be lifted.

## 2018-10-24 ENCOUNTER — Ambulatory Visit: Payer: Medicare Other | Admitting: Student in an Organized Health Care Education/Training Program

## 2018-11-08 ENCOUNTER — Other Ambulatory Visit: Payer: Self-pay | Admitting: Student in an Organized Health Care Education/Training Program

## 2018-11-13 DIAGNOSIS — S301XXA Contusion of abdominal wall, initial encounter: Secondary | ICD-10-CM | POA: Diagnosis not present

## 2018-11-13 DIAGNOSIS — J9601 Acute respiratory failure with hypoxia: Secondary | ICD-10-CM | POA: Diagnosis not present

## 2018-11-13 DIAGNOSIS — T8189XA Other complications of procedures, not elsewhere classified, initial encounter: Secondary | ICD-10-CM | POA: Diagnosis not present

## 2018-11-13 DIAGNOSIS — J9610 Chronic respiratory failure, unspecified whether with hypoxia or hypercapnia: Secondary | ICD-10-CM | POA: Diagnosis not present

## 2018-11-30 ENCOUNTER — Other Ambulatory Visit: Payer: Self-pay | Admitting: Student in an Organized Health Care Education/Training Program

## 2018-12-12 ENCOUNTER — Encounter: Payer: Self-pay | Admitting: Gastroenterology

## 2018-12-13 DIAGNOSIS — E790 Hyperuricemia without signs of inflammatory arthritis and tophaceous disease: Secondary | ICD-10-CM | POA: Diagnosis not present

## 2018-12-13 DIAGNOSIS — S301XXA Contusion of abdominal wall, initial encounter: Secondary | ICD-10-CM | POA: Diagnosis not present

## 2018-12-13 DIAGNOSIS — J9610 Chronic respiratory failure, unspecified whether with hypoxia or hypercapnia: Secondary | ICD-10-CM | POA: Diagnosis not present

## 2018-12-13 DIAGNOSIS — T8189XA Other complications of procedures, not elsewhere classified, initial encounter: Secondary | ICD-10-CM | POA: Diagnosis not present

## 2018-12-13 DIAGNOSIS — J9601 Acute respiratory failure with hypoxia: Secondary | ICD-10-CM | POA: Diagnosis not present

## 2018-12-20 DIAGNOSIS — N200 Calculus of kidney: Secondary | ICD-10-CM | POA: Diagnosis not present

## 2018-12-20 DIAGNOSIS — E790 Hyperuricemia without signs of inflammatory arthritis and tophaceous disease: Secondary | ICD-10-CM | POA: Diagnosis not present

## 2018-12-20 DIAGNOSIS — Z905 Acquired absence of kidney: Secondary | ICD-10-CM | POA: Diagnosis not present

## 2019-01-09 ENCOUNTER — Other Ambulatory Visit: Payer: Self-pay

## 2019-01-09 ENCOUNTER — Ambulatory Visit (INDEPENDENT_AMBULATORY_CARE_PROVIDER_SITE_OTHER): Payer: Medicare Other | Admitting: Student in an Organized Health Care Education/Training Program

## 2019-01-09 ENCOUNTER — Encounter: Payer: Self-pay | Admitting: Student in an Organized Health Care Education/Training Program

## 2019-01-09 ENCOUNTER — Other Ambulatory Visit: Payer: Self-pay | Admitting: Student in an Organized Health Care Education/Training Program

## 2019-01-09 VITALS — BP 143/56 | HR 76 | Temp 98.3°F | Ht 63.0 in | Wt 307.1 lb

## 2019-01-09 DIAGNOSIS — Z87442 Personal history of urinary calculi: Secondary | ICD-10-CM

## 2019-01-09 DIAGNOSIS — R011 Cardiac murmur, unspecified: Secondary | ICD-10-CM

## 2019-01-09 DIAGNOSIS — Z905 Acquired absence of kidney: Secondary | ICD-10-CM

## 2019-01-09 DIAGNOSIS — R609 Edema, unspecified: Secondary | ICD-10-CM

## 2019-01-09 DIAGNOSIS — I878 Other specified disorders of veins: Secondary | ICD-10-CM | POA: Diagnosis not present

## 2019-01-09 DIAGNOSIS — I10 Essential (primary) hypertension: Secondary | ICD-10-CM

## 2019-01-09 DIAGNOSIS — N200 Calculus of kidney: Secondary | ICD-10-CM

## 2019-01-09 DIAGNOSIS — R600 Localized edema: Secondary | ICD-10-CM

## 2019-01-09 DIAGNOSIS — Z79899 Other long term (current) drug therapy: Secondary | ICD-10-CM

## 2019-01-09 MED ORDER — POTASSIUM CITRATE ER 15 MEQ (1620 MG) PO TBCR: 1.0000 | EXTENDED_RELEASE_TABLET | Freq: Two times a day (BID) | ORAL | 3 refills | Status: DC

## 2019-01-09 NOTE — Patient Instructions (Signed)
Today we talked about your blood pressure, things are okay.  I want you to consider adding on another blood pressure medication called losartan.  Should be pretty low risk for side effects.  Lowering your blood pressure more a little lower your risk of heart disease or strokes.  This is just something for [place] to think about it and we can discuss at your next visit in 4 months.  We will get blood work at your next visit in October.

## 2019-01-09 NOTE — Assessment & Plan Note (Signed)
Stable, likely multifactorial due to obesity, lipedema, and some amount of lymphedema.  Continue with supportive care, compression stockings, ambulation is much as possible, Lasix 80 mg daily as well.  Continue to talk about weight loss.  We talked about wound prevention.

## 2019-01-09 NOTE — Assessment & Plan Note (Signed)
Blood pressure continues to be above goal.  Difficult situation with medication choice as I am outlined in previous notes.  I would be willing to try an ARB, should be low risk for angioedema.  Patient declines adding on another medication right now, she is worried about side effects and feels that her blood pressure control is adequate now.  Can keep a close eye and continue this conversation.  For now we will continue with amlodipine 10 mg daily, carvedilol 25 mg twice daily, and furosemide 80 mg once daily.

## 2019-01-09 NOTE — Progress Notes (Signed)
   Assessment and Plan:  See Encounters tab for problem-based medical decision making.   __________________________________________________________  HPI:   78 year old person here for follow-up of hypertension.  Doing well, lives at [facility] is [facility].  They have had no cases of COVID-19, they are planning for a testing event of all residents sometime next week.  She has had no symptoms of COVID-19, she is doing a good job with physical distancing recommendations.  Reports good compliance with her blood pressure medications, denies any side effects.  No chest pain or shortness of breath.  No recent illnesses.  Talked about her lower extremity swelling, denies any wounds.  Uses compression stockings occasionally but has difficulty donning and doffing.  __________________________________________________________  Problem List: Patient Active Problem List   Diagnosis Date Noted  . Hypercoagulable state ([place])     Priority: High  . Sleep apnea 07/08/2015    Priority: High  . Bilateral lower extremity edema 01/09/2014    Priority: Medium  . Hyperuricemia 05/25/2013    Priority: Medium  . Nephrolithiasis 05/25/2013    Priority: Medium  . Hypertension 12/22/2011    Priority: Medium  . Obesity, Class III, BMI 40-49.9 (morbid obesity) ([place]) 12/22/2011    Priority: Medium  . Asthma 12/04/2011    Priority: Low  . 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 Routine health maintenance 09/15/2011    Priority: Low    Medications: Reconciled today in Epic __________________________________________________________  Physical Exam:  Vital Signs: Vitals:   01/09/19 0847 01/09/19 0911  BP: (!) 150/62 (!) 143/56  Pulse: 81 76  Temp: 98.3 F (36.8 C)   TempSrc: Oral   SpO2: 94%   Weight: (!) 307 lb 1.6 oz (139.3 kg)     Gen: Well appearing, NAD CV: RRR, 2 out of 6 early systolic murmur at the left upper sternal border Pulm: Normal effort, CTA  throughout, no wheezing Ext: Warm, severe bilateral lipedema, left little worse than the right,  Skin: smooth with no signs of chronic stasis changes and no wounds

## 2019-01-09 NOTE — Assessment & Plan Note (Signed)
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  Follows up with urology and has had no signs of recurrent nephrolithiasis.  Plan is to continue with potassium citrate, twice daily, I provided a refill today.  Uric acid lowering with allopurinol, last checked earlier this year and was at goal.

## 2019-01-11 ENCOUNTER — Other Ambulatory Visit: Payer: Self-pay | Admitting: Internal Medicine

## 2019-01-13 DIAGNOSIS — T8189XA Other complications of procedures, not elsewhere classified, initial encounter: Secondary | ICD-10-CM | POA: Diagnosis not present

## 2019-01-13 DIAGNOSIS — S301XXA Contusion of abdominal wall, initial encounter: Secondary | ICD-10-CM | POA: Diagnosis not present

## 2019-01-13 DIAGNOSIS — J9610 Chronic respiratory failure, unspecified whether with hypoxia or hypercapnia: Secondary | ICD-10-CM | POA: Diagnosis not present

## 2019-01-13 DIAGNOSIS — J9601 Acute respiratory failure with hypoxia: Secondary | ICD-10-CM | POA: Diagnosis not present

## 2019-02-12 DIAGNOSIS — J9610 Chronic respiratory failure, unspecified whether with hypoxia or hypercapnia: Secondary | ICD-10-CM | POA: Diagnosis not present

## 2019-02-12 DIAGNOSIS — J9601 Acute respiratory failure with hypoxia: Secondary | ICD-10-CM | POA: Diagnosis not present

## 2019-02-12 DIAGNOSIS — T8189XA Other complications of procedures, not elsewhere classified, initial encounter: Secondary | ICD-10-CM | POA: Diagnosis not present

## 2019-02-12 DIAGNOSIS — S301XXA Contusion of abdominal wall, initial encounter: Secondary | ICD-10-CM | POA: Diagnosis not present

## 2019-03-01 ENCOUNTER — Other Ambulatory Visit: Payer: Self-pay | Admitting: Student in an Organized Health Care Education/Training Program

## 2019-03-15 DIAGNOSIS — S301XXA Contusion of abdominal wall, initial encounter: Secondary | ICD-10-CM | POA: Diagnosis not present

## 2019-03-15 DIAGNOSIS — T8189XA Other complications of procedures, not elsewhere classified, initial encounter: Secondary | ICD-10-CM | POA: Diagnosis not present

## 2019-03-15 DIAGNOSIS — J9601 Acute respiratory failure with hypoxia: Secondary | ICD-10-CM | POA: Diagnosis not present

## 2019-03-15 DIAGNOSIS — J9610 Chronic respiratory failure, unspecified whether with hypoxia or hypercapnia: Secondary | ICD-10-CM | POA: Diagnosis not present

## 2019-04-15 DIAGNOSIS — J9610 Chronic respiratory failure, unspecified whether with hypoxia or hypercapnia: Secondary | ICD-10-CM | POA: Diagnosis not present

## 2019-04-15 DIAGNOSIS — T8189XA Other complications of procedures, not elsewhere classified, initial encounter: Secondary | ICD-10-CM | POA: Diagnosis not present

## 2019-04-15 DIAGNOSIS — S301XXA Contusion of abdominal wall, initial encounter: Secondary | ICD-10-CM | POA: Diagnosis not present

## 2019-04-15 DIAGNOSIS — J9601 Acute respiratory failure with hypoxia: Secondary | ICD-10-CM | POA: Diagnosis not present

## 2019-05-15 ENCOUNTER — Ambulatory Visit (INDEPENDENT_AMBULATORY_CARE_PROVIDER_SITE_OTHER): Payer: Medicare Other | Admitting: Student in an Organized Health Care Education/Training Program

## 2019-05-15 ENCOUNTER — Other Ambulatory Visit: Payer: Self-pay

## 2019-05-15 ENCOUNTER — Other Ambulatory Visit: Payer: Self-pay | Admitting: Student in an Organized Health Care Education/Training Program

## 2019-05-15 ENCOUNTER — Encounter: Payer: Self-pay | Admitting: Student in an Organized Health Care Education/Training Program

## 2019-05-15 VITALS — BP 147/61 | HR 74 | Temp 98.0°F | Ht 63.0 in | Wt 302.8 lb

## 2019-05-15 DIAGNOSIS — J9610 Chronic respiratory failure, unspecified whether with hypoxia or hypercapnia: Secondary | ICD-10-CM | POA: Diagnosis not present

## 2019-05-15 DIAGNOSIS — Z23 Encounter for immunization: Secondary | ICD-10-CM | POA: Diagnosis not present

## 2019-05-15 DIAGNOSIS — Z79899 Other long term (current) drug therapy: Secondary | ICD-10-CM

## 2019-05-15 DIAGNOSIS — Z6841 Body Mass Index (BMI) 40.0 and over, adult: Secondary | ICD-10-CM

## 2019-05-15 DIAGNOSIS — D6859 Other primary thrombophilia: Secondary | ICD-10-CM | POA: Diagnosis not present

## 2019-05-15 DIAGNOSIS — Z7901 Long term (current) use of anticoagulants: Secondary | ICD-10-CM

## 2019-05-15 DIAGNOSIS — J9601 Acute respiratory failure with hypoxia: Secondary | ICD-10-CM | POA: Diagnosis not present

## 2019-05-15 DIAGNOSIS — Z86711 Personal history of pulmonary embolism: Secondary | ICD-10-CM

## 2019-05-15 DIAGNOSIS — E790 Hyperuricemia without signs of inflammatory arthritis and tophaceous disease: Secondary | ICD-10-CM | POA: Diagnosis not present

## 2019-05-15 DIAGNOSIS — Z905 Acquired absence of kidney: Secondary | ICD-10-CM

## 2019-05-15 DIAGNOSIS — I10 Essential (primary) hypertension: Secondary | ICD-10-CM

## 2019-05-15 DIAGNOSIS — T8189XA Other complications of procedures, not elsewhere classified, initial encounter: Secondary | ICD-10-CM | POA: Diagnosis not present

## 2019-05-15 DIAGNOSIS — S301XXA Contusion of abdominal wall, initial encounter: Secondary | ICD-10-CM | POA: Diagnosis not present

## 2019-05-15 NOTE — Assessment & Plan Note (Signed)
Severe obesity.  BMI 53 today.  Weight 302 pounds, down 5 pounds from my last visit 6 months ago.  We talked about interventions in her nutrition.  Encouraged increased activity.  Talked about small improvements being beneficial for her health.  We will continue to monitor weight.

## 2019-05-15 NOTE — Progress Notes (Signed)
   Assessment and Plan:  See Encounters tab for problem-based medical decision making.   __________________________________________________________  HPI:   78 year old woman here for follow-up of hypertension.  Patient reports doing fairly well.  She is struggling with isolation during the COVID-19 pandemic.  Lives in assisted living apartment building, dining room is been closed since March, not limits her interaction with friends and neighbors.  No recent illnesses, no ED visits, no recent hospitalizations.  She rarely exercises, can walk about a block before she feels short of breath.  Denies any chest pain with exertion.  No orthopnea.  No fevers or chills recently.  No cough.  Reports good compliance with her medications without side effects.  Left knee pain stable.  Nutrition status unchanged.  __________________________________________________________  Problem List: Patient Active Problem List   Diagnosis Date Noted  . Hypercoagulable state ([place])     Priority: High  . Sleep apnea 07/08/2015    Priority: High  . Bilateral lower extremity edema 01/09/2014    Priority: Medium  . Hyperuricemia 05/25/2013    Priority: Medium  . Nephrolithiasis 05/25/2013    Priority: Medium  . Hypertension 12/22/2011    Priority: Medium  . Obesity, Class III, BMI 40-49.9 (morbid obesity) ([place]) 12/22/2011    Priority: Medium  . Asthma 12/04/2011    Priority: Low  . 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 Routine health maintenance 09/15/2011    Priority: Low    Medications: Reconciled today in Epic __________________________________________________________  Physical Exam:  Vital Signs: Vitals:   05/15/19 0923  BP: (!) 147/61  Pulse: 74  Temp: 98 F (36.7 C)  TempSrc: Oral  SpO2: 94%  Weight: (!) 302 lb 12.8 oz (137.3 kg)  Height: 5\' 3"  (1.6 m)    Gen: Well appearing, NAD Neck: No cervical LAD, No thyromegaly or nodules, No JVD. CV: RRR, no murmurs Pulm: Normal effort,  CTA throughout, no wheezing Neuro: Alert, conversational, full strength in the upper and lower extremities, wide-based gait

## 2019-05-15 NOTE — Assessment & Plan Note (Signed)
Uricemia in the setting of nephrolithiasis and solitary kidney.  On allopurinol.  Will check uric acid today, goal is to keep the level below 5.

## 2019-05-15 NOTE — Patient Instructions (Signed)
It was a pleasure seeing you today.  We talked about your hypertension which seems to be well controlled right now.  We will check blood work to ensure you have normal kidney function and electrolytes.  We talked about your weight.  You have lost 5 pounds in the last 6 months.  Keep up the good work.  We talked about isolation and loneliness during the pandemic.  We talked about increasing your exercise and activity outside the house.  We talked about maintaining safe physical distancing wearing mask to prevent infection.

## 2019-05-15 NOTE — Assessment & Plan Note (Signed)
Blood pressure control is fair.  She is intolerant of most of her antihypertensives.  Plan is to continue with amlodipine 10 mg and carvedilol 25 mg twice daily.  We have considered losartan in the past, but patient is hesitant due to history of angioedema with ACE inhibitor.  We will continue to monitor her blood pressure closely and assess risks and benefits of other agents.

## 2019-05-15 NOTE — Assessment & Plan Note (Signed)
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  Tolerating well with no bleeding side effects.  Will check renal function today.  Plan to continue with Xarelto 10 mg daily.

## 2019-05-16 ENCOUNTER — Encounter: Payer: Self-pay | Admitting: Student in an Organized Health Care Education/Training Program

## 2019-05-16 LAB — LIPID PANEL
Chol/HDL Ratio: 2.9 ratio (ref 0.0–4.4)
Cholesterol, Total: 174 mg/dL (ref 100–199)
HDL: 59 mg/dL (ref >39)
LDL Chol Calc (NIH): 100 mg/dL — ABNORMAL HIGH (ref 0–99)
Triglycerides: 81 mg/dL (ref 0–149)
VLDL Cholesterol Cal: 15 mg/dL (ref 5–40)

## 2019-05-16 LAB — BMP8+ANION GAP
Anion Gap: 13 mmol/L (ref 10.0–18.0)
BUN/Creatinine Ratio: 20 (ref 12–28)
BUN: 17 mg/dL (ref 8–27)
CO2: 30 mmol/L — ABNORMAL HIGH (ref 20–29)
Calcium: 9.7 mg/dL (ref 8.7–10.3)
Chloride: 96 mmol/L (ref 96–106)
Creatinine, Ser: 0.86 mg/dL (ref 0.57–1.00)
GFR calc Af Amer: 76 mL/min/{1.73_m2} (ref >59)
GFR calc non Af Amer: 66 mL/min/{1.73_m2} (ref >59)
Glucose: 106 mg/dL — ABNORMAL HIGH (ref 65–99)
Potassium: 4 mmol/L (ref 3.5–5.2)
Sodium: 139 mmol/L (ref 134–144)

## 2019-05-16 LAB — URIC ACID: Uric Acid: 2.7 mg/dL (ref 2.5–7.1)

## 2019-06-15 DIAGNOSIS — S301XXA Contusion of abdominal wall, initial encounter: Secondary | ICD-10-CM | POA: Diagnosis not present

## 2019-06-15 DIAGNOSIS — J9601 Acute respiratory failure with hypoxia: Secondary | ICD-10-CM | POA: Diagnosis not present

## 2019-06-15 DIAGNOSIS — T8189XA Other complications of procedures, not elsewhere classified, initial encounter: Secondary | ICD-10-CM | POA: Diagnosis not present

## 2019-06-15 DIAGNOSIS — J9610 Chronic respiratory failure, unspecified whether with hypoxia or hypercapnia: Secondary | ICD-10-CM | POA: Diagnosis not present

## 2019-07-04 ENCOUNTER — Other Ambulatory Visit: Payer: Self-pay | Admitting: Student in an Organized Health Care Education/Training Program

## 2019-07-14 DIAGNOSIS — Z719 Counseling, unspecified: Secondary | ICD-10-CM | POA: Diagnosis not present

## 2019-07-15 DIAGNOSIS — J9601 Acute respiratory failure with hypoxia: Secondary | ICD-10-CM | POA: Diagnosis not present

## 2019-07-15 DIAGNOSIS — T8189XA Other complications of procedures, not elsewhere classified, initial encounter: Secondary | ICD-10-CM | POA: Diagnosis not present

## 2019-07-15 DIAGNOSIS — S301XXA Contusion of abdominal wall, initial encounter: Secondary | ICD-10-CM | POA: Diagnosis not present

## 2019-07-15 DIAGNOSIS — J9610 Chronic respiratory failure, unspecified whether with hypoxia or hypercapnia: Secondary | ICD-10-CM | POA: Diagnosis not present

## 2019-07-25 ENCOUNTER — Other Ambulatory Visit: Payer: Self-pay | Admitting: Student in an Organized Health Care Education/Training Program

## 2019-07-25 DIAGNOSIS — Z1231 Encounter for screening mammogram for malignant neoplasm of breast: Secondary | ICD-10-CM

## 2019-07-31 ENCOUNTER — Other Ambulatory Visit: Payer: Self-pay | Admitting: Student in an Organized Health Care Education/Training Program

## 2019-07-31 DIAGNOSIS — R600 Localized edema: Secondary | ICD-10-CM

## 2019-07-31 NOTE — Telephone Encounter (Signed)
Next appt scheduled 11/06/19 with PCP. 

## 2019-08-15 DIAGNOSIS — J9610 Chronic respiratory failure, unspecified whether with hypoxia or hypercapnia: Secondary | ICD-10-CM | POA: Diagnosis not present

## 2019-08-15 DIAGNOSIS — J9601 Acute respiratory failure with hypoxia: Secondary | ICD-10-CM | POA: Diagnosis not present

## 2019-08-15 DIAGNOSIS — S301XXA Contusion of abdominal wall, initial encounter: Secondary | ICD-10-CM | POA: Diagnosis not present

## 2019-08-15 DIAGNOSIS — T8189XA Other complications of procedures, not elsewhere classified, initial encounter: Secondary | ICD-10-CM | POA: Diagnosis not present

## 2019-09-11 ENCOUNTER — Ambulatory Visit
Admission: RE | Admit: 2019-09-11 | Discharge: 2019-09-11 | Disposition: A | Discharge status: Home / Self Care | Payer: Medicare Other | Source: Ambulatory Visit | Attending: Student in an Organized Health Care Education/Training Program | Admitting: Student in an Organized Health Care Education/Training Program

## 2019-09-11 ENCOUNTER — Other Ambulatory Visit: Payer: Self-pay

## 2019-09-11 DIAGNOSIS — Z1231 Encounter for screening mammogram for malignant neoplasm of breast: Secondary | ICD-10-CM

## 2019-09-15 DIAGNOSIS — S301XXA Contusion of abdominal wall, initial encounter: Secondary | ICD-10-CM | POA: Diagnosis not present

## 2019-09-15 DIAGNOSIS — J9601 Acute respiratory failure with hypoxia: Secondary | ICD-10-CM | POA: Diagnosis not present

## 2019-09-15 DIAGNOSIS — J9610 Chronic respiratory failure, unspecified whether with hypoxia or hypercapnia: Secondary | ICD-10-CM | POA: Diagnosis not present

## 2019-09-15 DIAGNOSIS — T8189XA Other complications of procedures, not elsewhere classified, initial encounter: Secondary | ICD-10-CM | POA: Diagnosis not present

## 2019-10-12 DIAGNOSIS — H25813 Combined forms of age-related cataract, bilateral: Secondary | ICD-10-CM | POA: Diagnosis not present

## 2019-10-12 DIAGNOSIS — H353131 Nonexudative age-related macular degeneration, bilateral, early dry stage: Secondary | ICD-10-CM | POA: Diagnosis not present

## 2019-10-12 DIAGNOSIS — H18513 Endothelial corneal dystrophy, bilateral: Secondary | ICD-10-CM | POA: Diagnosis not present

## 2019-11-02 DIAGNOSIS — T8189XA Other complications of procedures, not elsewhere classified, initial encounter: Secondary | ICD-10-CM | POA: Diagnosis not present

## 2019-11-02 DIAGNOSIS — S301XXA Contusion of abdominal wall, initial encounter: Secondary | ICD-10-CM | POA: Diagnosis not present

## 2019-11-02 DIAGNOSIS — J9610 Chronic respiratory failure, unspecified whether with hypoxia or hypercapnia: Secondary | ICD-10-CM | POA: Diagnosis not present

## 2019-11-02 DIAGNOSIS — J9601 Acute respiratory failure with hypoxia: Secondary | ICD-10-CM | POA: Diagnosis not present

## 2019-11-06 ENCOUNTER — Ambulatory Visit (INDEPENDENT_AMBULATORY_CARE_PROVIDER_SITE_OTHER): Payer: Medicare Other | Admitting: Student in an Organized Health Care Education/Training Program

## 2019-11-06 ENCOUNTER — Encounter: Payer: Self-pay | Admitting: Student in an Organized Health Care Education/Training Program

## 2019-11-06 ENCOUNTER — Other Ambulatory Visit: Payer: Self-pay

## 2019-11-06 VITALS — BP 145/66 | HR 86 | Temp 97.4°F | Ht 63.0 in | Wt 305.0 lb

## 2019-11-06 DIAGNOSIS — D6859 Other primary thrombophilia: Secondary | ICD-10-CM | POA: Diagnosis not present

## 2019-11-06 DIAGNOSIS — G4733 Obstructive sleep apnea (adult) (pediatric): Secondary | ICD-10-CM

## 2019-11-06 DIAGNOSIS — G4730 Sleep apnea, unspecified: Secondary | ICD-10-CM

## 2019-11-06 DIAGNOSIS — Z87442 Personal history of urinary calculi: Secondary | ICD-10-CM

## 2019-11-06 DIAGNOSIS — Z7901 Long term (current) use of anticoagulants: Secondary | ICD-10-CM

## 2019-11-06 DIAGNOSIS — Z79899 Other long term (current) drug therapy: Secondary | ICD-10-CM

## 2019-11-06 DIAGNOSIS — Z0000 Encounter for general adult medical examination without abnormal findings: Secondary | ICD-10-CM

## 2019-11-06 DIAGNOSIS — Z905 Acquired absence of kidney: Secondary | ICD-10-CM | POA: Diagnosis not present

## 2019-11-06 DIAGNOSIS — I10 Essential (primary) hypertension: Secondary | ICD-10-CM | POA: Diagnosis not present

## 2019-11-06 DIAGNOSIS — F5089 Other specified eating disorder: Secondary | ICD-10-CM

## 2019-11-06 DIAGNOSIS — F5083 Pica in adults: Secondary | ICD-10-CM | POA: Insufficient documentation

## 2019-11-06 DIAGNOSIS — Z9119 Patient's noncompliance with other medical treatment and regimen: Secondary | ICD-10-CM

## 2019-11-06 NOTE — Assessment & Plan Note (Signed)
Patient is 79 years old and has completed most of her health maintenance.  I did help her make an appointment for Covid vaccine later this week.

## 2019-11-06 NOTE — Assessment & Plan Note (Signed)
Blood pressure control is just okay.  A little higher than ideal but we are very limited on antihypertensives in her case due to history of hyperuricemia, nephrolithiasis, solitary kidney.  Plan is to continue with amlodipine 10 mg and carvedilol 25 mg twice daily.  Will check BMP today.

## 2019-11-06 NOTE — Progress Notes (Signed)
   Assessment and Plan:  See Encounters tab for problem-based medical decision making.   __________________________________________________________  HPI:   79 year old person here for follow-up of hypertension.  Acute complaint today of feeling tired, intermittent dry mouth, and a strong desire to chew ice.  She says that ice things like candy.  Is very satisfied.  This is been going her weeks.  She has not felt this before.  She denies vaginal bleeding, denies hematuria no fevers or chills.  No recent Covid exposures.  Reports good compliance with her medicines, no adverse side effects.  No recent falls.  She is independent, lives at home, is all activities of daily living herself.  Feeling more tired lately, still not tolerating CPAP well.  __________________________________________________________  Problem List: Patient Active Problem List   Diagnosis Date Noted  . Hypercoagulable state (HCC)     Priority: High  . Sleep apnea 07/08/2015    Priority: High  . Bilateral lower extremity edema 01/09/2014    Priority: Medium  . Hyperuricemia 05/25/2013    Priority: Medium  . Nephrolithiasis 05/25/2013    Priority: Medium  . Hypertension 12/22/2011    Priority: Medium  . Obesity, Class III, BMI 40-49.9 (morbid obesity) (HCC) 12/22/2011    Priority: Medium  . Asthma 12/04/2011    Priority: Low  . 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 Routine health maintenance 09/15/2011    Priority: Low  . 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  Ext:  Warm, large nonpitting edema in both of her legs due to obesity, Skin:  mild chronic stasis changes to the distal skin

## 2019-11-06 NOTE — Assessment & Plan Note (Signed)
New complaint of strong desire is consistent with pica.  Only other symptom for anemia is fatigue, though that is likely coming from untreated sleep apnea.  No obvious source of bleeding stool, urine, no vaginal bleeding.  Will check CBC, ferritin, and iron panel today.  She is anticoagulated for hypercoagulable state which we will continue for now as she is not having active blood loss.

## 2019-11-06 NOTE — Patient Instructions (Signed)
Thanks, Hetty Blend, your appointment is scheduled! [address] [place], [place], [address] Thursday November 09, 2019 Arrive by 3:00 PM Starts at 3:00 PM (15 minutes)   PATIENT [facility] [phone]  This visit is at the [employer] [address] [place], [place] [address]. Someone will direct you where to park. For scheduling questions, or to cancel please call [phone]. We will schedule your second dose appointment on site when you receive your first dose

## 2019-11-06 NOTE — Assessment & Plan Note (Signed)
Patient continues to struggle to tolerate CPAP.  She reports missing a piece from her machine and needs to follow-up with her equipment company to have that replaced.  She is feeling fatigue and she recognizes that it is coming from not wearing the CPAP mask at night reliably

## 2019-11-07 ENCOUNTER — Telehealth: Payer: Self-pay | Admitting: Student in an Organized Health Care Education/Training Program

## 2019-11-07 DIAGNOSIS — E611 Iron deficiency: Secondary | ICD-10-CM | POA: Insufficient documentation

## 2019-11-07 LAB — IRON AND TIBC
Iron Saturation: 9 % — CL (ref 15–55)
Iron: 36 ug/dL (ref 27–139)
Total Iron Binding Capacity: 422 ug/dL (ref 250–450)
UIBC: 386 ug/dL — ABNORMAL HIGH (ref 118–369)

## 2019-11-07 LAB — CBC
Hematocrit: 39.2 % (ref 34.0–46.6)
Hemoglobin: 12.6 g/dL (ref 11.1–15.9)
MCH: 29.9 pg (ref 26.6–33.0)
MCHC: 32.1 g/dL (ref 31.5–35.7)
MCV: 93 fL (ref 79–97)
Platelets: 234 10*3/uL (ref 150–450)
RBC: 4.21 x10E6/uL (ref 3.77–5.28)
RDW: 14.4 % (ref 11.7–15.4)
WBC: 8.4 10*3/uL (ref 3.4–10.8)

## 2019-11-07 LAB — BMP8+ANION GAP
Anion Gap: 12 mmol/L (ref 10.0–18.0)
BUN/Creatinine Ratio: 23 (ref 12–28)
BUN: 21 mg/dL (ref 8–27)
CO2: 31 mmol/L — ABNORMAL HIGH (ref 20–29)
Calcium: 10.1 mg/dL (ref 8.7–10.3)
Chloride: 98 mmol/L (ref 96–106)
Creatinine, Ser: 0.93 mg/dL (ref 0.57–1.00)
GFR calc Af Amer: 69 mL/min/{1.73_m2} (ref >59)
GFR calc non Af Amer: 59 mL/min/{1.73_m2} — ABNORMAL LOW (ref >59)
Glucose: 112 mg/dL — ABNORMAL HIGH (ref 65–99)
Potassium: 4.1 mmol/L (ref 3.5–5.2)
Sodium: 141 mmol/L (ref 134–144)

## 2019-11-07 LAB — FERRITIN: Ferritin: 18 ng/mL (ref 15–150)

## 2019-11-07 MED ORDER — POLYSACCHARIDE IRON COMPLEX 150 MG PO CAPS: 150.0000 mg | ORAL_CAPSULE | Freq: Every day | ORAL | 1 refills | Status: DC

## 2019-11-07 NOTE — Telephone Encounter (Signed)
We checked iron levels to investigate new symptom of pica over the last few weeks.  Patient is not anemic but she does have low ferritin and an iron panel consistent with iron deficiency anemia.  Unclear source of bleeding, almost certainly microscopic.  She is anticoagulated for history of unprovoked pulmonary embolism.  Last colonoscopy 2010 which did show some polyps.  Plan is to start oral iron replacements.  She has tolerated oral iron in the past, no constipation or GI upset.  We will try iron polysaccharide 150 mg daily.  Will follow up in 4 weeks with repeat iron studies.  If no improvement will arrange for IV iron.  We will refer her back to GI for diagnostic colonoscopy.

## 2019-11-08 ENCOUNTER — Other Ambulatory Visit: Payer: Self-pay | Admitting: *Deleted

## 2019-11-08 DIAGNOSIS — R600 Localized edema: Secondary | ICD-10-CM

## 2019-11-08 NOTE — Telephone Encounter (Signed)
Your patient has been added on 12/11/2019 and notified.

## 2019-11-08 NOTE — Telephone Encounter (Signed)
Yes, 5/10 would be fine. Thank you!

## 2019-11-08 NOTE — Telephone Encounter (Signed)
Unable to sch an in person visit for 12/05/2019 (4 wk f/u appt) per request.  Your next clinic date is 12/11/2019.  Please advise if you would like to overbook your clinic for 12/11/2019.

## 2019-11-09 ENCOUNTER — Ambulatory Visit: Payer: Medicare Other

## 2019-11-09 MED ORDER — POTASSIUM CITRATE ER 15 MEQ (1620 MG) PO TBCR: 1.0000 | EXTENDED_RELEASE_TABLET | Freq: Two times a day (BID) | ORAL | 1 refills | Status: DC

## 2019-11-09 MED ORDER — AMLODIPINE BESYLATE 10 MG PO TABS: 10.0000 mg | ORAL_TABLET | Freq: Every day | ORAL | 1 refills | Status: DC

## 2019-11-09 MED ORDER — RIVAROXABAN 10 MG PO TABS: 10.0000 mg | ORAL_TABLET | Freq: Every day | ORAL | 1 refills | Status: DC

## 2019-11-09 MED ORDER — FLOVENT HFA 44 MCG/ACT IN AERO: 2.0000 | INHALATION_SPRAY | Freq: Every day | RESPIRATORY_TRACT | 1 refills | Status: DC

## 2019-11-09 MED ORDER — CARVEDILOL 25 MG PO TABS: 25.0000 mg | ORAL_TABLET | Freq: Two times a day (BID) | ORAL | 1 refills | Status: DC

## 2019-11-09 MED ORDER — POLYSACCHARIDE IRON COMPLEX 150 MG PO CAPS: 150.0000 mg | ORAL_CAPSULE | Freq: Every day | ORAL | 1 refills | Status: DC

## 2019-11-09 MED ORDER — FUROSEMIDE 80 MG PO TABS: 80.0000 mg | ORAL_TABLET | Freq: Every day | ORAL | 1 refills | Status: DC

## 2019-11-09 MED ORDER — ALBUTEROL SULFATE HFA 108 (90 BASE) MCG/ACT IN AERS: INHALATION_SPRAY | RESPIRATORY_TRACT | 1 refills | Status: DC

## 2019-11-20 ENCOUNTER — Encounter: Payer: Self-pay | Admitting: Physician Assistant

## 2019-11-27 ENCOUNTER — Encounter: Payer: Self-pay | Admitting: *Deleted

## 2019-12-01 ENCOUNTER — Encounter: Payer: Self-pay | Admitting: Physician Assistant

## 2019-12-01 ENCOUNTER — Telehealth: Payer: Self-pay | Admitting: *Deleted

## 2019-12-01 ENCOUNTER — Ambulatory Visit (INDEPENDENT_AMBULATORY_CARE_PROVIDER_SITE_OTHER): Payer: Medicare Other | Admitting: Physician Assistant

## 2019-12-01 VITALS — BP 138/74 | HR 72 | Temp 98.2°F | Ht 61.32 in | Wt 300.4 lb

## 2019-12-01 DIAGNOSIS — D509 Iron deficiency anemia, unspecified: Secondary | ICD-10-CM | POA: Diagnosis not present

## 2019-12-01 DIAGNOSIS — Z7901 Long term (current) use of anticoagulants: Secondary | ICD-10-CM | POA: Diagnosis not present

## 2019-12-01 MED ORDER — SUTAB 1479-225-188 MG PO TABS: 1.0000 | ORAL_TABLET | ORAL | 0 refills | Status: DC

## 2019-12-01 NOTE — Telephone Encounter (Signed)
   Tina Chase [birth year] [id number]  Dear Dr Oswaldo Done:  We have scheduled the above named patient for a(n) endoscopy/colonoscopy procedure. Our records show that (s)he is on anticoagulation therapy.  Please advise as to whether the patient may come off their therapy of xarelto 2 days prior to their procedure which is scheduled for 02/26/20.  Please route your response to Vernia Buff, CMA or fax response to [phone].  Sincerely,    [facility]

## 2019-12-01 NOTE — Progress Notes (Signed)
Chief Complaint: Iron deficiency anemia  HPI:    Tina Chase is a 79 year old African-American female, known to Dr. Russella Dar, with past medical history as listed below, including pulmonary embolism on Xarelto, CKD and sleep apnea with CPAP, who was referred to me by Tyson Alias,* for a complaint of iron deficiency anemia.      11/27/2008 colonoscopy with Dr. Russella Dar with mild diverticulosis in the sigmoid colon and otherwise normal.  Repeat recommended in 10 years.    11/06/2019 CBC with a hemoglobin of 12.6, iron studies with percent saturation low at 9.  She saw her PCP the day and described a pica and also fatigue.    Today, the patient tells me that she has been on once daily iron over the past month and feels much better.  She is no longer really fatigued and is no longer craving ice.  Tells me she has no GI complaints at all and has not been seeing any blood anywhere.  Does tell me that she stopped eating all her collard greens etc. because this had excess vitamin K and "I am on a blood thinner".    Denies fever, chills, weight loss, change in bowel habits, abdominal pain, nausea, vomiting, heartburn, reflux or symptoms that awaken her from sleep.  Past Medical History:  Diagnosis Date  . Arthritis    left knee  and left shoulder   . Asthma   . 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 Diverticulosis   . Hyperlipidemia   . Hyperparathyroidism   . Hypertension   . IDA (iron deficiency anemia)   . Kidney atrophy   . Myocardial infarction (HCC)   . Obesity   . Pica in adults   . Pulmonary embolism (HCC)    Provoked 2013, Unprovoked 2017  . Pulmonary nodule    20mm, stable 2013 to 2017, benign  . Sleep apnea    CPAP  . Small bowel obstruction [facility])     Past Surgical History:  Procedure Laterality Date  . APPLICATION OF WOUND VAC  12/04/2011   Procedure: APPLICATION OF WOUND VAC;  Surgeon: Adolph Pollack, MD;  Location: [facility];  Service: General;;  x two    . DILATION AND CURETTAGE OF UTERUS     D and C x 3   . INCISIONAL HERNIA REPAIR  12/04/2011   Procedure: HERNIA REPAIR INCISIONAL;  Surgeon: Adolph Pollack, MD;  Location: [facility];  Service: General;  Laterality: N/A;  . KIDNEY CYST REMOVAL    . LAPAROSCOPIC NEPHRECTOMY  08/19/2011   Procedure: LAPAROSCOPIC NEPHRECTOMY;  Surgeon: Milford Cage, MD;  Location: [facility];  Service: Urology;  Laterality: Right;  . LAPAROTOMY  12/04/2011   Procedure: EXPLORATORY LAPAROTOMY;  Surgeon: Adolph Pollack, MD;  Location: [facility];  Service: General;  Laterality: N/A;   bowel resection   . TUBAL LIGATION      Current Outpatient Medications  Medication Sig Dispense Refill  . acetaminophen (TYLENOL) 325 MG tablet Take 975 mg by mouth every 6 (six) hours as needed for moderate pain or headache.    . albuterol (PROAIR HFA) 108 (90 Base) MCG/ACT inhaler INHALE 2 PUFFS INTO THE LUNGS EVERY 4 HOURS AS NEEDED FOR WHEEZING OR SHORTNESS OF BREATH 18 g 1  . allopurinol (ZYLOPRIM) 300 MG tablet Take 300 mg by mouth daily.  3  . amLODipine (NORVASC) 10 MG tablet Take 1 tablet (10 mg total) by mouth daily. 90 tablet  1  . carvedilol (COREG) 25 MG tablet Take 1 tablet (25 mg total) by mouth 2 (two) times daily. 180 tablet 1  . fluticasone (FLOVENT HFA) 44 MCG/ACT inhaler Inhale 2 puffs into the lungs daily. 21.2 g 1  . furosemide (LASIX) 80 MG tablet Take 1 tablet (80 mg total) by mouth daily. 90 tablet 1  . Guaifenesin (MUCINEX MAXIMUM STRENGTH) 1200 MG TB12 Take 1 tablet by mouth daily.    . iron polysaccharides (NU-IRON) 150 MG capsule Take 1 capsule (150 mg total) by mouth daily. 90 capsule 1  . loperamide (IMODIUM) 2 MG capsule TAKE 1 CAPSULE BY MOUTH  EVERY 6 HOURS AS NEEDED FOR DIARRHEA OR LOOSE STOOLS 60 capsule 0  . Multiple Vitamins-Minerals (PRESERVISION AREDS 2 PO) Take 2 tablets by mouth daily.    . Potassium Citrate 15 MEQ (1620 MG) TBCR Take 1 tablet by mouth 2 (two) times daily. 180 tablet 1  .  rivaroxaban (XARELTO) 10 MG TABS tablet Take 1 tablet (10 mg total) by mouth daily. 90 tablet 1   No current facility-administered medications for this visit.    Allergies as of 12/01/2019 - Review Complete 12/01/2019  Allergen Reaction Noted  . Hydralazine Anaphylaxis and Other (See Comments) 10/23/2010  . Lisinopril Swelling and Other (See Comments)   . Aspirin Other (See Comments)   . Crab [shellfish allergy] Hives and Other (See Comments) 08/06/2011    Family History  Problem Relation Age of Onset  . Hyperlipidemia Mother   . Hypertension Mother   . Heart disease Father        had a heart attack at age 73  . Heart attack Father 60  . Heart disease Brother        at age 65  . Heart attack Brother 50  . Stroke Neg Hx   . Cancer Neg Hx   . Nephrolithiasis Neg Hx   . Breast cancer Neg Hx     Social History   Socioeconomic History  . Marital status: Single    Spouse name: Not on file  . Number of children: Not on file  . Years of education: Not on file  . Highest education level: Not on file  Occupational History  . Occupation: retired    Comment: CNA  Tobacco Use  . Smoking status: Former Smoker    Packs/day: 0.50    Years: 1.00    Pack years: 0.50    Types: Cigarettes    Quit date: 08/03/1968    Years since quitting: 51.3  . Smokeless tobacco: Never Used  Substance and Sexual Activity  . Alcohol use: No    Alcohol/week: 0.0 standard drinks  . Drug use: No  . Sexual activity: Not Currently  Other Topics Concern  . Not on file  Social History Narrative  . Not on file   Social Determinants of Health   Financial Resource Strain:   . Difficulty of Paying Living Expenses:   Food Insecurity:   . Worried About Programme researcher, broadcasting/film/video in the Last Year:   . Barista in the Last Year:   Transportation Needs:   . Freight forwarder (Medical):   Marland Kitchen Lack of Transportation (Non-Medical):   Physical Activity:   . Days of Exercise per Week:   . Minutes of  Exercise per Session:   Stress:   . Feeling of Stress :   Social Connections:   . Frequency of Communication with Friends and Family:   . Frequency  of Social Gatherings with Friends and Family:   . Attends Religious Services:   . Active Member of Clubs or Organizations:   . Attends Banker Meetings:   Marland Kitchen Marital Status:   Intimate Partner Violence:   . Fear of Current or Ex-Partner:   . Emotionally Abused:   Marland Kitchen Physically Abused:   . Sexually Abused:     Review of Systems:    Constitutional: No weight loss, fever or chills Skin: No rash  Cardiovascular: No chest pain Respiratory: No SOB  Gastrointestinal: See HPI and otherwise negative Genitourinary: No dysuria Neurological: No headache Musculoskeletal: No new muscle or joint pain Hematologic: No bleeding Psychiatric: No history of depression or anxiety   Physical Exam:  Vital signs: Temp 98.2 F (36.8 C)   Ht 5' 1.32" (1.558 m) Comment: height measured without shoes  Wt (!) 300 lb 6 oz (136.2 kg)   LMP 09/23/1986   BMI 56.17 kg/m   Constitutional:   Pleasant obese AA female appears to be in NAD, Well developed, Well nourished, alert and cooperative Head:  Normocephalic and atraumatic. Eyes:   PEERL, EOMI. No icterus. Conjunctiva pink. Ears:  Normal auditory acuity. Neck:  Supple Throat: Oral cavity and pharynx without inflammation, swelling or lesion.  Respiratory: Respirations even and unlabored. Lungs clear to auscultation bilaterally.   No wheezes, crackles, or rhonchi.  Cardiovascular: Normal S1, S2. No MRG. Regular rate and rhythm. No peripheral edema, cyanosis or pallor.  Gastrointestinal:  Soft, nondistended, nontender. No rebound or guarding. Normal bowel sounds. No appreciable masses or hepatomegaly. Rectal:  Not performed.  Msk:  Symmetrical without gross deformities. Without edema, no deformity or joint abnormality.  Neurologic:  Alert and  oriented x4;  grossly normal neurologically.  Skin:    Dry and intact without significant lesions or rashes. Psychiatric:  Demonstrates good judgement and reason without abnormal affect or behaviors.  RELEVANT LABS AND IMAGING: CBC    Component Value Date/Time   WBC 8.4 11/06/2019 0933   WBC 7.8 02/15/2017 0948   RBC 4.21 11/06/2019 0933   RBC 4.38 02/15/2017 0948   HGB 12.6 11/06/2019 0933   HCT 39.2 11/06/2019 0933   PLT 234 11/06/2019 0933   MCV 93 11/06/2019 0933   MCH 29.9 11/06/2019 0933   MCH 31.1 02/15/2017 0948   MCHC 32.1 11/06/2019 0933   MCHC 32.9 02/15/2017 0948   RDW 14.4 11/06/2019 0933   LYMPHSABS 3.1 09/27/2016 0954   MONOABS 0.4 09/27/2016 0954   EOSABS 0.1 09/27/2016 0954   BASOSABS 0.1 09/27/2016 0954    CMP     Component Value Date/Time   NA 141 11/06/2019 0933   K 4.1 11/06/2019 0933   CL 98 11/06/2019 0933   CO2 31 (H) 11/06/2019 0933   GLUCOSE 112 (H) 11/06/2019 0933   GLUCOSE 110 (H) 09/27/2016 0954   BUN 21 11/06/2019 0933   CREATININE 0.93 11/06/2019 0933   CREATININE 0.93 02/14/2015 0953   CALCIUM 10.1 11/06/2019 0933   CALCIUM 10.5 06/10/2009 1342   PROT 7.7 09/27/2016 0954   ALBUMIN 4.0 09/27/2016 0954   AST 24 09/27/2016 0954   ALT 14 09/27/2016 0954   ALKPHOS 95 09/27/2016 0954   BILITOT 0.3 09/27/2016 0954   GFRNONAA 59 (L) 11/06/2019 0933   GFRNONAA 62 02/14/2015 0953   GFRAA 69 11/06/2019 0933   GFRAA 71 02/14/2015 0953   Iron/TIBC/Ferritin/ %Sat    Component Value Date/Time   IRON 36 11/06/2019 0933   TIBC 422 11/06/2019  4665   FERRITIN 18 11/06/2019 0933   IRONPCTSAT 9 (LL) 11/06/2019 0933   Assessment: 1.  Iron deficiency: As above with symptoms of pica and fatigue, last colonoscopy in 2010 with recommendations to repeat in 10 years; consider GI source of blood loss versus other 2.  History of pulmonary embolism on Xarelto  Plan: 1.  Scheduled the patient for an EGD and colonoscopy at the hospital due to her BMI.  These were scheduled with Dr. Fuller Plan.  Did discuss risks,  benefits, limitations and alternatives and the patient agrees to proceed.  Patient will be at least 2 weeks out from her Covid vaccines by the time of procedure.  These procedures were scheduled in July.  We will recheck CBC and iron studies in about a month and a half to ensure that they are stable. 2.  Continue iron for now 3.  Patient was advised to hold her Xarelto for 2 days prior to time of procedure.  We will communicate with her prescribing physician to ensure that holding her Xarelto is except for her. 4.  Patient to follow in clinic per recommendations after time of procedures.  Ellouise Newer, PA-C [facility] 12/01/2019, 9:07 AM  Cc: Lalla Brothers Marcello Moores

## 2019-12-01 NOTE — Telephone Encounter (Signed)
Yes, there will be minimal risk to holding xarelto for 2 days prior to the procedure. Thanks for letting me know.

## 2019-12-01 NOTE — Telephone Encounter (Signed)
I have left a message for patient to call back. 

## 2019-12-01 NOTE — Patient Instructions (Addendum)
You have been scheduled for an endoscopy and colonoscopy. Please follow the written instructions given to you at your visit today. Please pick up your prep supplies at the pharmacy within the next 1-3 days. If you use inhalers (even only as needed), please bring them with you on the day of your procedure. Your physician has requested that you go to www.startemmi.com and enter the access code given to you at your visit today. This web site gives a general overview about your procedure. However, you should still follow specific instructions given to you by our office regarding your preparation for the procedure.  Your provider has requested that you go to the basement level for lab work the week of 01/15/20. Press "B" on the elevator. The lab is located at the first door on the left as you exit the elevator. Orders have already been placed in our computer system. You only need to provide your name and date of birth to the staff. They have everything else needed to perform testing.  If you are age 62 or older, your body mass index should be between 23-30. Your Body mass index is 56.17 kg/m. If this is out of the aforementioned range listed, please consider follow up with your Primary Care Provider.   Due to recent changes in healthcare laws, you may see the results of your imaging and laboratory studies on MyChart before your provider has had a chance to review them.  We understand that in some cases there may be results that are confusing or concerning to you. Not all laboratory results come back in the same time frame and the provider may be waiting for multiple results in order to interpret others.  Please give [place] 48 hours in order for your provider to thoroughly review all the results before contacting the office for clarification of your results.

## 2019-12-01 NOTE — Progress Notes (Signed)
Reviewed and agree with management plan.  Terrance Lanahan T. Dionysios Massman, MD FACG [facility]  

## 2019-12-04 NOTE — Telephone Encounter (Signed)
I have spoken to patient to advise that Dr Oswaldo Done has given the okay to hold xarelto 2 days prior to her upcoming procedure with Dr Russella Dar. Patient verbalizes understanding of this.

## 2019-12-11 ENCOUNTER — Ambulatory Visit (INDEPENDENT_AMBULATORY_CARE_PROVIDER_SITE_OTHER): Payer: Medicare Other | Admitting: Student in an Organized Health Care Education/Training Program

## 2019-12-11 ENCOUNTER — Other Ambulatory Visit: Payer: Self-pay

## 2019-12-11 ENCOUNTER — Encounter: Payer: Self-pay | Admitting: Student in an Organized Health Care Education/Training Program

## 2019-12-11 VITALS — BP 156/61 | HR 67 | Temp 98.3°F | Ht 63.0 in | Wt 298.5 lb

## 2019-12-11 DIAGNOSIS — E611 Iron deficiency: Secondary | ICD-10-CM | POA: Diagnosis not present

## 2019-12-11 DIAGNOSIS — K432 Incisional hernia without obstruction or gangrene: Secondary | ICD-10-CM | POA: Diagnosis not present

## 2019-12-11 DIAGNOSIS — K469 Unspecified abdominal hernia without obstruction or gangrene: Secondary | ICD-10-CM | POA: Insufficient documentation

## 2019-12-11 NOTE — Progress Notes (Signed)
   Assessment and Plan:  See Encounters tab for problem-based medical decision making.   __________________________________________________________  HPI:   79 year old person here for follow-up of iron deficiency anemia.  This is a new diagnosis from her last visit in early April.  No obvious source of bleeding, likely microscopic.  I referred her to GI and they are planning for an endoscopy upper and lower in late July, has to be done in the operating room at the hospital because of comorbidities.  She is anticoagulated because of a history of hypercoagulable state with unprovoked PE.  She has tolerated starting oral iron polysaccharide once daily well.  Denies constipation, says it is helpful for her chronic loose bowel movements.  Reports some darkening of stools since starting the oral iron which is to be expected.  Cravings for chewing ice have resolved.  However she is having more dyspnea with exertion.  She had trouble walking from our waiting room to the exam room without being short of breath.  Could not walk up steps right now.  No bendopnea, no chest pain or pressure.  Having increased pain and pressure along the right lower quadrant related to a abdominal wall hernia that she has had essentially partial colectomy surgery.  She feels like this pain is partly what takes her breath away when she is walking.  No fevers or chills.   __________________________________________________________  Problem List: Patient Active Problem List   Diagnosis Date Noted  . Hypercoagulable state (HCC)     Priority: High  . Sleep apnea 07/08/2015    Priority: High  . Bilateral lower extremity edema 01/09/2014    Priority: Medium  . Hyperuricemia 05/25/2013    Priority: Medium  . Nephrolithiasis 05/25/2013    Priority: Medium  . Hypertension 12/22/2011    Priority: Medium  . Obesity, Class III, BMI 40-49.9 (morbid obesity) (HCC) 12/22/2011    Priority: Medium  . Asthma 12/04/2011    Priority:  Low  . 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 Routine health maintenance 09/15/2011    Priority: Low  . Abdominal hernia 12/11/2019  . Iron deficiency 11/07/2019    Medications: Reconciled today in Epic __________________________________________________________  Physical Exam:  Vital Signs: Vitals:   12/11/19 1111  BP: (!) 156/61  Pulse: 67  Temp: 98.3 F (36.8 C)  TempSrc: Oral  SpO2: 93%  Weight: 298 lb 8 oz (135.4 kg)  Height: 5\' 3"  (1.6 m)    Gen: Well appearing, NAD CV: RRR, no murmurs Pulm: Normal effort, CTA throughout, no wheezing Abd: Soft, NT, ND, well-healed midline incision below the navel, in the right lower quadrant there is a large hernia which is nontender to palpation

## 2019-12-11 NOTE — Assessment & Plan Note (Addendum)
Good adherence to oral iron polysaccharide for about 5 weeks.  No GI side effects, no constipation.  Pica symptoms have resolved, but she is having more dyspnea on exertion.  Plan to recheck CBC, ferritin, and iron profile.  If still iron deficient will need to arrange for IV Feraheme.  She has consulted with Dr. Thamas Jaegers and is planned for colonoscopy and EGD with Dr. Russella Dar in July to look for the source of bleeding.  Addendum:  Ferritin and iron sat are still low, consistent with iron deficiency. Normal hemoglobin. No change with oral iron replacement. Will arrange for IV feraheme infusion with short stay. Orders placed.

## 2019-12-11 NOTE — Assessment & Plan Note (Signed)
Increasingly symptomatic large abdominal hernia right next to midline incision from the prior partial colectomy.  No signs of incarceration or gangrene today.  However because it is becoming more symptomatic and limiting her functional status I am going to refer her to general surgery for consideration of the nonurgent repair.  She is a borderline candidate for the surgery, but she reports this hernia discomfort is significantly impacting her activities of daily living.

## 2019-12-12 LAB — CBC
Hematocrit: 40.9 % (ref 34.0–46.6)
Hemoglobin: 12.7 g/dL (ref 11.1–15.9)
MCH: 29 pg (ref 26.6–33.0)
MCHC: 31.1 g/dL — ABNORMAL LOW (ref 31.5–35.7)
MCV: 93 fL (ref 79–97)
Platelets: 297 10*3/uL (ref 150–450)
RBC: 4.38 x10E6/uL (ref 3.77–5.28)
RDW: 14.9 % (ref 11.7–15.4)
WBC: 7.3 10*3/uL (ref 3.4–10.8)

## 2019-12-12 LAB — IRON AND TIBC
Iron Saturation: 8 % — CL (ref 15–55)
Iron: 35 ug/dL (ref 27–139)
Total Iron Binding Capacity: 423 ug/dL (ref 250–450)
UIBC: 388 ug/dL — ABNORMAL HIGH (ref 118–369)

## 2019-12-12 LAB — FERRITIN: Ferritin: 19 ng/mL (ref 15–150)

## 2019-12-12 NOTE — Addendum Note (Signed)
Addended by: Erlinda Hong T on: 12/12/2019 09:02 AM   Modules accepted: Orders, SmartSet

## 2019-12-13 DIAGNOSIS — T8189XA Other complications of procedures, not elsewhere classified, initial encounter: Secondary | ICD-10-CM | POA: Diagnosis not present

## 2019-12-13 DIAGNOSIS — J9610 Chronic respiratory failure, unspecified whether with hypoxia or hypercapnia: Secondary | ICD-10-CM | POA: Diagnosis not present

## 2019-12-13 DIAGNOSIS — S301XXA Contusion of abdominal wall, initial encounter: Secondary | ICD-10-CM | POA: Diagnosis not present

## 2019-12-13 DIAGNOSIS — J9601 Acute respiratory failure with hypoxia: Secondary | ICD-10-CM | POA: Diagnosis not present

## 2019-12-18 ENCOUNTER — Ambulatory Visit (HOSPITAL_COMMUNITY)
Admission: RE | Admit: 2019-12-18 | Discharge: 2019-12-18 | Disposition: A | Discharge status: Home / Self Care | Payer: Medicare Other | Source: Ambulatory Visit | Attending: Student in an Organized Health Care Education/Training Program | Admitting: Student in an Organized Health Care Education/Training Program

## 2019-12-18 ENCOUNTER — Other Ambulatory Visit: Payer: Self-pay

## 2019-12-18 DIAGNOSIS — E611 Iron deficiency: Secondary | ICD-10-CM

## 2019-12-18 MED ORDER — SODIUM CHLORIDE 0.9 % IV SOLN
510.0000 mg | Freq: Once | INTRAVENOUS | Status: AC
  Administered 2019-12-18: 510 mg via INTRAVENOUS
  Filled 2019-12-18: qty 17

## 2019-12-18 NOTE — Discharge Instructions (Signed)

## 2020-01-13 DIAGNOSIS — S301XXA Contusion of abdominal wall, initial encounter: Secondary | ICD-10-CM | POA: Diagnosis not present

## 2020-01-13 DIAGNOSIS — J9601 Acute respiratory failure with hypoxia: Secondary | ICD-10-CM | POA: Diagnosis not present

## 2020-01-13 DIAGNOSIS — J9610 Chronic respiratory failure, unspecified whether with hypoxia or hypercapnia: Secondary | ICD-10-CM | POA: Diagnosis not present

## 2020-01-13 DIAGNOSIS — T8189XA Other complications of procedures, not elsewhere classified, initial encounter: Secondary | ICD-10-CM | POA: Diagnosis not present

## 2020-01-15 ENCOUNTER — Other Ambulatory Visit (INDEPENDENT_AMBULATORY_CARE_PROVIDER_SITE_OTHER): Payer: Medicare Other

## 2020-01-15 DIAGNOSIS — D509 Iron deficiency anemia, unspecified: Secondary | ICD-10-CM | POA: Diagnosis not present

## 2020-01-15 DIAGNOSIS — Z7901 Long term (current) use of anticoagulants: Secondary | ICD-10-CM

## 2020-01-15 LAB — CBC WITH DIFFERENTIAL/PLATELET
Basophils Absolute: 0.1 10*3/uL (ref 0.0–0.1)
Basophils Relative: 0.9 % (ref 0.0–3.0)
Eosinophils Absolute: 0.2 10*3/uL (ref 0.0–0.7)
Eosinophils Relative: 3.1 % (ref 0.0–5.0)
HCT: 40.4 % (ref 36.0–46.0)
Hemoglobin: 13 g/dL (ref 12.0–15.0)
Lymphocytes Relative: 31.6 % (ref 12.0–46.0)
Lymphs Abs: 2.4 10*3/uL (ref 0.7–4.0)
MCHC: 32.3 g/dL (ref 30.0–36.0)
MCV: 96 fl (ref 78.0–100.0)
Monocytes Absolute: 0.7 10*3/uL (ref 0.1–1.0)
Monocytes Relative: 9.6 % (ref 3.0–12.0)
Neutro Abs: 4.1 10*3/uL (ref 1.4–7.7)
Neutrophils Relative %: 54.8 % (ref 43.0–77.0)
Platelets: 223 10*3/uL (ref 150.0–400.0)
RBC: 4.2 Mil/uL (ref 3.87–5.11)
RDW: 17.8 % — ABNORMAL HIGH (ref 11.5–15.5)
WBC: 7.5 10*3/uL (ref 4.0–10.5)

## 2020-01-15 LAB — IBC + FERRITIN
Ferritin: 58.4 ng/mL (ref 10.0–291.0)
Iron: 46 ug/dL (ref 42–145)
Saturation Ratios: 11.3 % — ABNORMAL LOW (ref 20.0–50.0)
Transferrin: 292 mg/dL (ref 212.0–360.0)

## 2020-01-31 ENCOUNTER — Other Ambulatory Visit: Payer: Self-pay | Admitting: Student in an Organized Health Care Education/Training Program

## 2020-01-31 DIAGNOSIS — R600 Localized edema: Secondary | ICD-10-CM

## 2020-02-12 DIAGNOSIS — T8189XA Other complications of procedures, not elsewhere classified, initial encounter: Secondary | ICD-10-CM | POA: Diagnosis not present

## 2020-02-12 DIAGNOSIS — J9601 Acute respiratory failure with hypoxia: Secondary | ICD-10-CM | POA: Diagnosis not present

## 2020-02-12 DIAGNOSIS — J9610 Chronic respiratory failure, unspecified whether with hypoxia or hypercapnia: Secondary | ICD-10-CM | POA: Diagnosis not present

## 2020-02-12 DIAGNOSIS — S301XXA Contusion of abdominal wall, initial encounter: Secondary | ICD-10-CM | POA: Diagnosis not present

## 2020-02-22 ENCOUNTER — Other Ambulatory Visit (HOSPITAL_COMMUNITY)
Admission: RE | Admit: 2020-02-22 | Discharge: 2020-02-22 | Disposition: A | Discharge status: Home / Self Care | Payer: Medicare Other | Source: Ambulatory Visit | Attending: Gastroenterology | Admitting: Gastroenterology

## 2020-02-22 DIAGNOSIS — Z01812 Encounter for preprocedural laboratory examination: Secondary | ICD-10-CM | POA: Insufficient documentation

## 2020-02-22 DIAGNOSIS — Z20822 Contact with and (suspected) exposure to covid-19: Secondary | ICD-10-CM | POA: Diagnosis not present

## 2020-02-22 LAB — SARS CORONAVIRUS 2 (TAT 6-24 HRS): SARS Coronavirus 2: NEGATIVE

## 2020-02-26 ENCOUNTER — Encounter (HOSPITAL_COMMUNITY): Payer: Self-pay | Admitting: Gastroenterology

## 2020-02-26 ENCOUNTER — Other Ambulatory Visit: Payer: Self-pay

## 2020-02-26 ENCOUNTER — Encounter (HOSPITAL_COMMUNITY): Payer: Self-pay

## 2020-02-26 ENCOUNTER — Other Ambulatory Visit: Payer: Self-pay | Admitting: Student in an Organized Health Care Education/Training Program

## 2020-02-26 ENCOUNTER — Ambulatory Visit (HOSPITAL_COMMUNITY): Payer: Medicare Other | Admitting: Anesthesiology

## 2020-02-26 ENCOUNTER — Ambulatory Visit (HOSPITAL_BASED_OUTPATIENT_CLINIC_OR_DEPARTMENT_OTHER)
Admission: RE | Admit: 2020-02-26 | Discharge: 2020-02-26 | Disposition: A | Discharge status: Home / Self Care | Payer: Medicare Other | Source: Home / Self Care | Attending: Gastroenterology | Admitting: Gastroenterology

## 2020-02-26 ENCOUNTER — Encounter (HOSPITAL_COMMUNITY)
Admission: RE | Disposition: A | Discharge status: Home / Self Care | Payer: Self-pay | Source: Home / Self Care | Attending: Gastroenterology

## 2020-02-26 ENCOUNTER — Ambulatory Visit (HOSPITAL_COMMUNITY): Admit: 2020-02-26 | Payer: Medicare Other | Admitting: Gastroenterology

## 2020-02-26 DIAGNOSIS — J811 Chronic pulmonary edema: Secondary | ICD-10-CM | POA: Diagnosis not present

## 2020-02-26 DIAGNOSIS — R0902 Hypoxemia: Secondary | ICD-10-CM | POA: Diagnosis not present

## 2020-02-26 DIAGNOSIS — J9602 Acute respiratory failure with hypercapnia: Secondary | ICD-10-CM | POA: Diagnosis not present

## 2020-02-26 DIAGNOSIS — J90 Pleural effusion, not elsewhere classified: Secondary | ICD-10-CM | POA: Diagnosis not present

## 2020-02-26 DIAGNOSIS — Z8249 Family history of ischemic heart disease and other diseases of the circulatory system: Secondary | ICD-10-CM | POA: Insufficient documentation

## 2020-02-26 DIAGNOSIS — E876 Hypokalemia: Secondary | ICD-10-CM | POA: Diagnosis not present

## 2020-02-26 DIAGNOSIS — E213 Hyperparathyroidism, unspecified: Secondary | ICD-10-CM | POA: Diagnosis not present

## 2020-02-26 DIAGNOSIS — J45909 Unspecified asthma, uncomplicated: Secondary | ICD-10-CM | POA: Insufficient documentation

## 2020-02-26 DIAGNOSIS — K3189 Other diseases of stomach and duodenum: Secondary | ICD-10-CM

## 2020-02-26 DIAGNOSIS — M47814 Spondylosis without myelopathy or radiculopathy, thoracic region: Secondary | ICD-10-CM | POA: Diagnosis not present

## 2020-02-26 DIAGNOSIS — J9811 Atelectasis: Secondary | ICD-10-CM | POA: Diagnosis not present

## 2020-02-26 DIAGNOSIS — K5730 Diverticulosis of large intestine without perforation or abscess without bleeding: Secondary | ICD-10-CM | POA: Insufficient documentation

## 2020-02-26 DIAGNOSIS — Z7901 Long term (current) use of anticoagulants: Secondary | ICD-10-CM | POA: Insufficient documentation

## 2020-02-26 DIAGNOSIS — K449 Diaphragmatic hernia without obstruction or gangrene: Secondary | ICD-10-CM | POA: Insufficient documentation

## 2020-02-26 DIAGNOSIS — I700 Atherosclerosis of aorta: Secondary | ICD-10-CM | POA: Diagnosis not present

## 2020-02-26 DIAGNOSIS — K640 First degree hemorrhoids: Secondary | ICD-10-CM | POA: Insufficient documentation

## 2020-02-26 DIAGNOSIS — I5033 Acute on chronic diastolic (congestive) heart failure: Secondary | ICD-10-CM | POA: Diagnosis not present

## 2020-02-26 DIAGNOSIS — N189 Chronic kidney disease, unspecified: Secondary | ICD-10-CM | POA: Insufficient documentation

## 2020-02-26 DIAGNOSIS — I252 Old myocardial infarction: Secondary | ICD-10-CM | POA: Insufficient documentation

## 2020-02-26 DIAGNOSIS — I517 Cardiomegaly: Secondary | ICD-10-CM | POA: Diagnosis not present

## 2020-02-26 DIAGNOSIS — Z20822 Contact with and (suspected) exposure to covid-19: Secondary | ICD-10-CM | POA: Diagnosis not present

## 2020-02-26 DIAGNOSIS — J9622 Acute and chronic respiratory failure with hypercapnia: Secondary | ICD-10-CM | POA: Diagnosis not present

## 2020-02-26 DIAGNOSIS — Z905 Acquired absence of kidney: Secondary | ICD-10-CM | POA: Insufficient documentation

## 2020-02-26 DIAGNOSIS — J9601 Acute respiratory failure with hypoxia: Secondary | ICD-10-CM | POA: Diagnosis not present

## 2020-02-26 DIAGNOSIS — Z886 Allergy status to analgesic agent status: Secondary | ICD-10-CM | POA: Insufficient documentation

## 2020-02-26 DIAGNOSIS — K222 Esophageal obstruction: Secondary | ICD-10-CM | POA: Diagnosis not present

## 2020-02-26 DIAGNOSIS — D509 Iron deficiency anemia, unspecified: Secondary | ICD-10-CM | POA: Diagnosis not present

## 2020-02-26 DIAGNOSIS — I130 Hypertensive heart and chronic kidney disease with heart failure and stage 1 through stage 4 chronic kidney disease, or unspecified chronic kidney disease: Secondary | ICD-10-CM | POA: Diagnosis not present

## 2020-02-26 DIAGNOSIS — Z888 Allergy status to other drugs, medicaments and biological substances status: Secondary | ICD-10-CM | POA: Insufficient documentation

## 2020-02-26 DIAGNOSIS — I10 Essential (primary) hypertension: Secondary | ICD-10-CM | POA: Diagnosis not present

## 2020-02-26 DIAGNOSIS — R0602 Shortness of breath: Secondary | ICD-10-CM | POA: Diagnosis not present

## 2020-02-26 DIAGNOSIS — D6859 Other primary thrombophilia: Secondary | ICD-10-CM | POA: Diagnosis not present

## 2020-02-26 DIAGNOSIS — E873 Alkalosis: Secondary | ICD-10-CM | POA: Diagnosis not present

## 2020-02-26 DIAGNOSIS — Z86711 Personal history of pulmonary embolism: Secondary | ICD-10-CM | POA: Insufficient documentation

## 2020-02-26 DIAGNOSIS — J181 Lobar pneumonia, unspecified organism: Secondary | ICD-10-CM | POA: Diagnosis not present

## 2020-02-26 DIAGNOSIS — G4730 Sleep apnea, unspecified: Secondary | ICD-10-CM | POA: Insufficient documentation

## 2020-02-26 DIAGNOSIS — G9341 Metabolic encephalopathy: Secondary | ICD-10-CM | POA: Diagnosis not present

## 2020-02-26 DIAGNOSIS — E785 Hyperlipidemia, unspecified: Secondary | ICD-10-CM | POA: Insufficient documentation

## 2020-02-26 DIAGNOSIS — K2950 Unspecified chronic gastritis without bleeding: Secondary | ICD-10-CM | POA: Insufficient documentation

## 2020-02-26 DIAGNOSIS — I272 Pulmonary hypertension, unspecified: Secondary | ICD-10-CM | POA: Diagnosis not present

## 2020-02-26 DIAGNOSIS — Z79899 Other long term (current) drug therapy: Secondary | ICD-10-CM | POA: Insufficient documentation

## 2020-02-26 DIAGNOSIS — E669 Obesity, unspecified: Secondary | ICD-10-CM | POA: Insufficient documentation

## 2020-02-26 DIAGNOSIS — N179 Acute kidney failure, unspecified: Secondary | ICD-10-CM | POA: Diagnosis not present

## 2020-02-26 DIAGNOSIS — K2960 Other gastritis without bleeding: Secondary | ICD-10-CM

## 2020-02-26 DIAGNOSIS — I129 Hypertensive chronic kidney disease with stage 1 through stage 4 chronic kidney disease, or unspecified chronic kidney disease: Secondary | ICD-10-CM | POA: Insufficient documentation

## 2020-02-26 DIAGNOSIS — Z6841 Body Mass Index (BMI) 40.0 and over, adult: Secondary | ICD-10-CM | POA: Insufficient documentation

## 2020-02-26 DIAGNOSIS — Z87891 Personal history of nicotine dependence: Secondary | ICD-10-CM | POA: Insufficient documentation

## 2020-02-26 DIAGNOSIS — M1990 Unspecified osteoarthritis, unspecified site: Secondary | ICD-10-CM | POA: Insufficient documentation

## 2020-02-26 DIAGNOSIS — G4733 Obstructive sleep apnea (adult) (pediatric): Secondary | ICD-10-CM | POA: Diagnosis not present

## 2020-02-26 DIAGNOSIS — J690 Pneumonitis due to inhalation of food and vomit: Secondary | ICD-10-CM | POA: Diagnosis not present

## 2020-02-26 DIAGNOSIS — J9621 Acute and chronic respiratory failure with hypoxia: Secondary | ICD-10-CM | POA: Diagnosis not present

## 2020-02-26 HISTORY — PX: COLONOSCOPY WITH PROPOFOL: SHX5780

## 2020-02-26 HISTORY — PX: BIOPSY: SHX5522

## 2020-02-26 HISTORY — PX: ESOPHAGOGASTRODUODENOSCOPY (EGD) WITH PROPOFOL: SHX5813

## 2020-02-26 SURGERY — ESOPHAGOGASTRODUODENOSCOPY (EGD) WITH PROPOFOL
Anesthesia: Monitor Anesthesia Care

## 2020-02-26 SURGERY — COLONOSCOPY WITH PROPOFOL
Anesthesia: Monitor Anesthesia Care

## 2020-02-26 MED ORDER — PROPOFOL 500 MG/50ML IV EMUL
INTRAVENOUS | Status: DC | PRN
  Administered 2020-02-26: 10 mg via INTRAVENOUS
  Administered 2020-02-26: 125 ug/kg/min via INTRAVENOUS
  Administered 2020-02-26: 10 mg via INTRAVENOUS
  Administered 2020-02-26: 30 mg via INTRAVENOUS

## 2020-02-26 MED ORDER — ONDANSETRON HCL 4 MG/2ML IJ SOLN
INTRAMUSCULAR | Status: DC | PRN
  Administered 2020-02-26: 4 mg via INTRAVENOUS

## 2020-02-26 MED ORDER — ALBUTEROL SULFATE (2.5 MG/3ML) 0.083% IN NEBU
INHALATION_SOLUTION | RESPIRATORY_TRACT | Status: AC
  Filled 2020-02-26: qty 3

## 2020-02-26 MED ORDER — PANTOPRAZOLE SODIUM 40 MG PO TBEC
40.0000 mg | DELAYED_RELEASE_TABLET | Freq: Every day | ORAL | 11 refills | Status: DC
Start: 2020-02-26 — End: 2021-01-20

## 2020-02-26 MED ORDER — SODIUM CHLORIDE 0.9 % IV SOLN: INTRAVENOUS | Status: DC

## 2020-02-26 MED ORDER — PROPOFOL 500 MG/50ML IV EMUL
INTRAVENOUS | Status: AC
  Filled 2020-02-26: qty 50

## 2020-02-26 MED ORDER — MIDAZOLAM HCL 5 MG/5ML IJ SOLN
INTRAMUSCULAR | Status: DC | PRN
  Administered 2020-02-26: 1 mg via INTRAVENOUS

## 2020-02-26 MED ORDER — LACTATED RINGERS IV SOLN
INTRAVENOUS | Status: DC
  Administered 2020-02-26: 1000 mL via INTRAVENOUS

## 2020-02-26 MED ORDER — ALBUTEROL SULFATE (2.5 MG/3ML) 0.083% IN NEBU
2.5000 mg | INHALATION_SOLUTION | Freq: Once | RESPIRATORY_TRACT | Status: AC
  Administered 2020-02-26: 2.5 mg via RESPIRATORY_TRACT

## 2020-02-26 MED ORDER — ALBUTEROL SULFATE (2.5 MG/3ML) 0.083% IN NEBU
2.5000 mg | INHALATION_SOLUTION | RESPIRATORY_TRACT | Status: AC
  Administered 2020-02-26: 2.5 mg via RESPIRATORY_TRACT

## 2020-02-26 MED ORDER — MIDAZOLAM HCL 2 MG/2ML IJ SOLN
INTRAMUSCULAR | Status: AC
  Filled 2020-02-26: qty 2

## 2020-02-26 SURGICAL SUPPLY — 24 items

## 2020-02-26 NOTE — Anesthesia Postprocedure Evaluation (Signed)
Anesthesia Post Note  Patient: Tina Chase  Procedure(s) Performed: ESOPHAGOGASTRODUODENOSCOPY (EGD) WITH PROPOFOL (N/A ) COLONOSCOPY WITH PROPOFOL (N/A ) BIOPSY     Patient location during evaluation: Endoscopy Anesthesia Type: MAC Level of consciousness: awake and alert, patient cooperative and oriented Pain management: pain level controlled Vital Signs Assessment: post-procedure vital signs reviewed and stable Respiratory status: spontaneous breathing, nonlabored ventilation, respiratory function stable and patient connected to nasal cannula oxygen Cardiovascular status: blood pressure returned to baseline and stable Postop Assessment: no apparent nausea or vomiting and able to ambulate Anesthetic complications: no Comments: Pt understands to use her home O2 full time today   No complications documented.  Last Vitals:  Vitals:   02/26/20 1045 02/26/20 1048  BP:    Pulse: 58 64  Resp: 12 18  Temp:    SpO2: (!) 74% (!) 84%    Last Pain:  Vitals:   02/26/20 0814  TempSrc: Oral  PainSc: 0-No pain                 Lavell Supple,E. Leighla Chestnutt

## 2020-02-26 NOTE — Op Note (Signed)
[facility] Patient Name: Tina Chase Procedure Date: 02/26/2020 MRN: [id number] Attending MD: Meryl Dare , MD Date of Birth: 11/05/1940 CSN: [id number] Age: 79 Admit Type: Inpatient Procedure:                Upper GI endoscopy Indications:              Unexplained iron deficiency anemia Providers:                Venita Lick. Russella Dar, MD, Alison Murray, RN, Everardo Pacific, Technician, Greig Right, CRNA Referring MD:             Tyson Alias, MD Medicines:                Monitored Anesthesia Care Complications:            No immediate complications. Estimated Blood Loss:     Estimated blood loss was minimal. Procedure:                Pre-Anesthesia Assessment:                           - Prior to the procedure, a History and Physical                            was performed, and patient medications and                            allergies were reviewed. The patient's tolerance of                            previous anesthesia was also reviewed. The risks                            and benefits of the procedure and the sedation                            options and risks were discussed with the patient.                            All questions were answered, and informed consent                            was obtained. Prior Anticoagulants: The patient has                            taken Xarelto (rivaroxaban), last dose was 2 days                            prior to procedure. ASA Grade Assessment: III - A                            patient with severe systemic disease. After  reviewing the risks and benefits, the patient was                            deemed in satisfactory condition to undergo the                            procedure.                           After obtaining informed consent, the endoscope was                            passed under direct vision. Throughout the                             procedure, the patient's blood pressure, pulse, and                            oxygen saturations were monitored continuously. The                            GIF-H190 ([id number]) Olympus gastroscope was                            introduced through the mouth, and advanced to the                            second part of duodenum. The upper GI endoscopy was                            accomplished without difficulty. The patient                            tolerated the procedure fairly well. Scope In: Scope Out: Findings:      One benign-appearing, intrinsic mild stenosis was found at the       gastroesophageal junction. This stenosis measured 1.5 cm (inner       diameter) x less than one cm (in length). The stenosis was traversed.      The exam of the esophagus was otherwise normal.      Multiple dispersed medium erosions with no bleeding and no stigmata of       recent bleeding were found in the gastric antrum. Biopsies were taken       with a cold forceps for histology.      A small hiatal hernia was present.      The exam of the stomach was otherwise normal.      The duodenal bulb and second portion of the duodenum were normal. Impression:               - Benign-appearing esophageal stenosis.                           - Erosive gastropathy with no bleeding and no  stigmata of recent bleeding. Biopsied.                           - Small hiatal hernia.                           - Normal duodenal bulb and second portion of the                            duodenum. Moderate Sedation:      Not Applicable - Patient had care per Anesthesia. Recommendation:           - Patient has a contact number available for                            emergencies. The signs and symptoms of potential                            delayed complications were discussed with the                            patient. Return to normal activities tomorrow.                            Written  discharge instructions were provided to the                            patient.                           - Resume previous diet.                           - Continue present medications.                           - Pantoprazole 40 mg po qam long term.                           - Await pathology results.                           - No aspirin, ibuprofen, naproxen, or other                            non-steroidal anti-inflammatory drugs long term.                           - Resume Xarelto (rivaroxaban) at prior dose in 3                            days. Refer to managing physician for further                            adjustment of therapy. Procedure Code(s):        --- Professional ---  [id number], Esophagogastroduodenoscopy, flexible,                            transoral; with biopsy, single or multiple Diagnosis Code(s):        --- Professional ---                           K22.2, Esophageal obstruction                           K31.89, Other diseases of stomach and duodenum                           K44.9, Diaphragmatic hernia without obstruction or                            gangrene                           D50.9, Iron deficiency anemia, unspecified CPT copyright 2019 [employer] Association. All rights reserved. The codes documented in this report are preliminary and upon coder review may  be revised to meet current compliance requirements. Meryl DareMalcolm T Ruhi Kopke, MD 02/26/2020 9:43:28 AM This report has been signed electronically. Number of Addenda: 0

## 2020-02-26 NOTE — Discharge Instructions (Signed)
YOU HAD AN ENDOSCOPIC PROCEDURE TODAY: Refer to the procedure report and other information in the discharge instructions given to you for any specific questions about what was found during the examination. If this information does not answer your questions, please call [place] office at [phone] to clarify.  ° °YOU SHOULD EXPECT: Some feelings of bloating in the abdomen. Passage of more gas than usual. Walking can help get rid of the air that was put into your GI tract during the procedure and reduce the bloating. If you had a lower endoscopy (such as a colonoscopy or flexible sigmoidoscopy) you may notice spotting of blood in your stool or on the toilet paper. Some abdominal soreness may be present for a day or two, also. ° °DIET: Your first meal following the procedure should be a light meal and then it is ok to progress to your normal diet. A half-sandwich or bowl of soup is an example of a good first meal. Heavy or fried foods are harder to digest and may make you feel nauseous or bloated. Drink plenty of fluids but you should avoid alcoholic beverages for 24 hours. If you had a esophageal dilation, please see attached instructions for diet.   ° °ACTIVITY: Your care partner should take you home directly after the procedure. You should plan to take it easy, moving slowly for the rest of the day. You can resume normal activity the day after the procedure however YOU SHOULD NOT DRIVE, use power tools, machinery or perform tasks that involve climbing or major physical exertion for 24 hours (because of the sedation medicines used during the test).  ° °SYMPTOMS TO REPORT IMMEDIATELY: °A gastroenterologist can be reached at any hour. Please call [phone]  for any of the following symptoms:  °Following lower endoscopy (colonoscopy, flexible sigmoidoscopy) °Excessive amounts of blood in the stool  °Significant tenderness, worsening of abdominal pains  °Swelling of the abdomen that is new, acute  °Fever of 100° or  higher  °Following upper endoscopy (EGD, EUS, ERCP, esophageal dilation) °Vomiting of blood or coffee ground material  °New, significant abdominal pain  °New, significant chest pain or pain under the shoulder blades  °Painful or persistently difficult swallowing  °New shortness of breath  °Black, tarry-looking or red, bloody stools ° °FOLLOW UP:  °If any biopsies were taken you will be contacted by phone or by letter within the next 1-3 weeks. Call [phone]  if you have not heard about the biopsies in 3 weeks.  °Please also call with any specific questions about appointments or follow up tests. ° °

## 2020-02-26 NOTE — Transfer of Care (Signed)
Immediate Anesthesia Transfer of Care Note  Patient: Tina Chase  Procedure(s) Performed: ESOPHAGOGASTRODUODENOSCOPY (EGD) WITH PROPOFOL (N/A ) COLONOSCOPY WITH PROPOFOL (N/A ) BIOPSY  Patient Location: Endoscopy Unit  Anesthesia Type:MAC  Level of Consciousness: awake, alert  and oriented  Airway & Oxygen Therapy: Patient Spontanous Breathing and Patient connected to face mask oxygen  Post-op Assessment: Report given to RN  Post vital signs: Reviewed and stable  Last Vitals:  Vitals Value Taken Time  BP    Temp    Pulse 79 02/26/20 0932  Resp 11 02/26/20 0932  SpO2 100 % 02/26/20 0932  Vitals shown include unvalidated device data.  Last Pain:  Vitals:   02/26/20 0814  TempSrc: Oral  PainSc: 0-No pain         Complications: No complications documented.

## 2020-02-26 NOTE — Op Note (Addendum)
[facility] Patient Name: Tina Chase Procedure Date: 02/26/2020 MRN: [id number] Attending MD: Meryl Dare , MD Date of Birth: [birth year] CSN: [id number] Age: 79 Admit Type: Inpatient Procedure:                Colonoscopy Indications:              Iron deficiency anemia Providers:                Venita Lick. Russella Dar, MD, Alison Murray, RN, Everardo Pacific, Technician, Greig Right, CRNA Referring MD:             Tyson Alias, MD Medicines:                Monitored Anesthesia Care Complications:            No immediate complications. Estimated blood loss:                            None. Estimated Blood Loss:     Estimated blood loss: none. Procedure:                Pre-Anesthesia Assessment:                           - Prior to the procedure, a History and Physical                            was performed, and patient medications and                            allergies were reviewed. The patient's tolerance of                            previous anesthesia was also reviewed. The risks                            and benefits of the procedure and the sedation                            options and risks were discussed with the patient.                            All questions were answered, and informed consent                            was obtained. Prior Anticoagulants: The patient has                            taken Xarelto (rivaroxaban), last dose was 2 days                            prior to procedure. ASA Grade Assessment: III - A  patient with severe systemic disease. After                            reviewing the risks and benefits, the patient was                            deemed in satisfactory condition to undergo the                            procedure.                           After obtaining informed consent, the colonoscope                            was passed under direct vision. Throughout  the                            procedure, the patient's blood pressure, pulse, and                            oxygen saturations were monitored continuously. The                            CF-HQ190L ([id number]) Olympus colonoscope was                            introduced through the anus and advanced to the the                            cecum, identified by appendiceal orifice and                            ileocecal valve. The ileocecal valve, appendiceal                            orifice, and rectum were photographed. The quality                            of the bowel preparation was good. The colonoscopy                            was performed without difficulty. The patient                            tolerated the procedure fairly well. Scope In: 9:00:03 AM Scope Out: 9:12:04 AM Scope Withdrawal Time: 0 hours 7 minutes 45 seconds  Total Procedure Duration: 0 hours 12 minutes 1 second  Findings:      The perianal and digital rectal examinations were normal.      Multiple medium-mouthed diverticula were found in the left colon. There       was narrowing of the colon in association with the diverticular opening.       There was evidence of diverticular spasm. There was evidence of an  impacted diverticulum. There was no evidence of diverticular bleeding.      Internal hemorrhoids were found during retroflexion. The hemorrhoids       were small and Grade I (internal hemorrhoids that do not prolapse).      The exam was otherwise without abnormality on direct and retroflexion       views. Impression:               - Moderate diverticulosis in the left colon.                           - Internal hemorrhoids.                           - The examination was otherwise normal on direct                            and retroflexion views.                           - No specimens collected. Moderate Sedation:      Not Applicable - Patient had care per Anesthesia. Recommendation:            - Patient has a contact number available for                            emergencies. The signs and symptoms of potential                            delayed complications were discussed with the                            patient. Return to normal activities tomorrow.                            Written discharge instructions were provided to the                            patient.                           - High fiber diet.                           - Continue present medications.                           - EGD today.                           - No repeat colonoscopy due to age.                           - Resume Xarelto (rivaroxaban) at prior dose in 3                            days. Refer to managing physician for further  adjustment of therapy. Procedure Code(s):        --- Professional ---                           [phone], Colonoscopy, flexible; diagnostic, including                            collection of specimen(s) by brushing or washing,                            when performed (separate procedure) Diagnosis Code(s):        --- Professional ---                           K64.0, First degree hemorrhoids                           D50.9, Iron deficiency anemia, unspecified                           K57.30, Diverticulosis of large intestine without                            perforation or abscess without bleeding CPT copyright 2019 American Medical Association. All rights reserved. The codes documented in this report are preliminary and upon coder review may  be revised to meet current compliance requirements. Meryl DareMalcolm T Journie Howson, MD 02/26/2020 9:34:52 AM This report has been signed electronically. Number of Addenda: 0

## 2020-02-26 NOTE — Anesthesia Preprocedure Evaluation (Addendum)
Anesthesia Evaluation  Patient identified by MRN, date of birth, ID band Patient awake    Reviewed: Allergy & Precautions, NPO status , Patient's Chart, lab work & pertinent test results, reviewed documented beta blocker date and time   History of Anesthesia Complications Negative for: history of anesthetic complications  Airway Mallampati: II  TM Distance: >3 FB Neck ROM: Full    Dental  (+) Poor Dentition, Dental Advisory Given, Chipped, Missing   Pulmonary sleep apnea and Continuous Positive Airway Pressure Ventilation , COPD,  COPD inhaler, former smoker,  02/22/2020 SARS coronavirus NEG   breath sounds clear to auscultation       Cardiovascular hypertension, Pt. on medications and Pt. on home beta blockers (-) angina Rhythm:Regular Rate:Normal  '17 ECHO: EF 55-60%, mod-severe TR   Neuro/Psych negative neurological ROS     GI/Hepatic Neg liver ROS, GERD  ,  Endo/Other  Morbid obesity  Renal/GU Renal InsufficiencyRenal disease     Musculoskeletal   Abdominal (+) + obese,   Peds  Hematology xarelto   Anesthesia Other Findings   Reproductive/Obstetrics                            Anesthesia Physical Anesthesia Plan  ASA: III  Anesthesia Plan: MAC   Post-op Pain Management:    Induction:   PONV Risk Score and Plan: 2 and Treatment may vary due to age or medical condition  Airway Management Planned: Natural Airway and Nasal Cannula  Additional Equipment: None  Intra-op Plan:   Post-operative Plan:   Informed Consent: I have reviewed the patients History and Physical, chart, labs and discussed the procedure including the risks, benefits and alternatives for the proposed anesthesia with the patient or authorized representative who has indicated his/her understanding and acceptance.     Dental advisory given  Plan Discussed with: CRNA and Surgeon  Anesthesia Plan Comments:         Anesthesia Quick Evaluation

## 2020-02-26 NOTE — H&P (Signed)
Chief Complaint: Iron deficiency anemia  HPI:    Tina Chase is a 79 year old African-American female, known to Dr. Russella Dar, with past medical history as listed below, including pulmonary embolism on Xarelto, CKD and sleep apnea with CPAP, who was referred to me by Tyson Alias,* for a complaint of iron deficiency anemia.      11/27/2008 colonoscopy with Dr. Russella Dar with mild diverticulosis in the sigmoid colon and otherwise normal.  Repeat recommended in 10 years.    11/06/2019 CBC with a hemoglobin of 12.6, iron studies with percent saturation low at 9.  She saw her PCP the day and described a pica and also fatigue.    Today, the patient tells me that she has been on once daily iron over the past month and feels much better.  She is no longer really fatigued and is no longer craving ice.  Tells me she has no GI complaints at all and has not been seeing any blood anywhere.  Does tell me that she stopped eating all her collard greens etc. because this had excess vitamin K and "I am on a blood thinner".    Denies fever, chills, weight loss, change in bowel habits, abdominal pain, nausea, vomiting, heartburn, reflux or symptoms that awaken her from sleep.      Past Medical History:  Diagnosis Date  . Arthritis    left knee  and left shoulder   . Asthma   . 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 Diverticulosis   . Hyperlipidemia   . Hyperparathyroidism   . Hypertension   . IDA (iron deficiency anemia)   . Kidney atrophy   . Myocardial infarction (HCC)   . Obesity   . Pica in adults   . Pulmonary embolism (HCC)    Provoked 2013, Unprovoked 2017  . Pulmonary nodule    47mm, stable 2013 to 2017, benign  . Sleep apnea    CPAP  . Small bowel obstruction [facility])          Past Surgical History:  Procedure Laterality Date  . APPLICATION OF WOUND VAC  12/04/2011   Procedure: APPLICATION OF WOUND VAC;  Surgeon: Adolph Pollack, MD;  Location: [facility]  ORS;  Service: General;;  x two  . DILATION AND CURETTAGE OF UTERUS     D and C x 3   . INCISIONAL HERNIA REPAIR  12/04/2011   Procedure: HERNIA REPAIR INCISIONAL;  Surgeon: Adolph Pollack, MD;  Location: [facility] ORS;  Service: General;  Laterality: N/A;  . KIDNEY CYST REMOVAL    . LAPAROSCOPIC NEPHRECTOMY  08/19/2011   Procedure: LAPAROSCOPIC NEPHRECTOMY;  Surgeon: Milford Cage, MD;  Location: [facility] ORS;  Service: Urology;  Laterality: Right;  . LAPAROTOMY  12/04/2011   Procedure: EXPLORATORY LAPAROTOMY;  Surgeon: Adolph Pollack, MD;  Location: [facility] ORS;  Service: General;  Laterality: N/A;   bowel resection   . TUBAL LIGATION            Current Outpatient Medications  Medication Sig Dispense Refill  . acetaminophen (TYLENOL) 325 MG tablet Take 975 mg by mouth every 6 (six) hours as needed for moderate pain or headache.    . albuterol (PROAIR HFA) 108 (90 Base) MCG/ACT inhaler INHALE 2 PUFFS INTO THE LUNGS EVERY 4 HOURS AS NEEDED FOR WHEEZING OR SHORTNESS OF BREATH 18 g 1  . allopurinol (ZYLOPRIM) 300 MG tablet Take 300 mg by mouth daily.  3  . amLODipine (NORVASC) 10  MG tablet Take 1 tablet (10 mg total) by mouth daily. 90 tablet 1  . carvedilol (COREG) 25 MG tablet Take 1 tablet (25 mg total) by mouth 2 (two) times daily. 180 tablet 1  . fluticasone (FLOVENT HFA) 44 MCG/ACT inhaler Inhale 2 puffs into the lungs daily. 21.2 g 1  . furosemide (LASIX) 80 MG tablet Take 1 tablet (80 mg total) by mouth daily. 90 tablet 1  . Guaifenesin (MUCINEX MAXIMUM STRENGTH) 1200 MG TB12 Take 1 tablet by mouth daily.    . iron polysaccharides (NU-IRON) 150 MG capsule Take 1 capsule (150 mg total) by mouth daily. 90 capsule 1  . loperamide (IMODIUM) 2 MG capsule TAKE 1 CAPSULE BY MOUTH  EVERY 6 HOURS AS NEEDED FOR DIARRHEA OR LOOSE STOOLS 60 capsule 0  . Multiple Vitamins-Minerals (PRESERVISION AREDS 2 PO) Take 2 tablets by mouth daily.    . Potassium Citrate 15 MEQ (1620 MG) TBCR  Take 1 tablet by mouth 2 (two) times daily. 180 tablet 1  . rivaroxaban (XARELTO) 10 MG TABS tablet Take 1 tablet (10 mg total) by mouth daily. 90 tablet 1   No current facility-administered medications for this visit.         Allergies as of 12/01/2019 - Review Complete 12/01/2019  Allergen Reaction Noted  . Hydralazine Anaphylaxis and Other (See Comments) 10/23/2010  . Lisinopril Swelling and Other (See Comments)   . Aspirin Other (See Comments)   . Crab [shellfish allergy] Hives and Other (See Comments) 08/06/2011         Family History  Problem Relation Age of Onset  . Hyperlipidemia Mother   . Hypertension Mother   . Heart disease Father        had a heart attack at age 11  . Heart attack Father 41  . Heart disease Brother        at age 75  . Heart attack Brother 50  . Stroke Neg Hx   . Cancer Neg Hx   . Nephrolithiasis Neg Hx   . Breast cancer Neg Hx     Social History        Socioeconomic History  . Marital status: Single    Spouse name: Not on file  . Number of children: Not on file  . Years of education: Not on file  . Highest education level: Not on file  Occupational History  . Occupation: retired    Comment: CNA  Tobacco Use  . Smoking status: Former Smoker    Packs/day: 0.50    Years: 1.00    Pack years: 0.50    Types: Cigarettes    Quit date: 08/03/1968    Years since quitting: 51.3  . Smokeless tobacco: Never Used  Substance and Sexual Activity  . Alcohol use: No    Alcohol/week: 0.0 standard drinks  . Drug use: No  . Sexual activity: Not Currently  Other Topics Concern  . Not on file  Social History Narrative  . Not on file   Social Determinants of Health      Financial Resource Strain:   . Difficulty of Paying Living Expenses:   Food Insecurity:   . Worried About Programme researcher, broadcasting/film/video in the Last Year:   . Barista in the Last Year:   Transportation Needs:   . Freight forwarder  (Medical):   Marland Kitchen Lack of Transportation (Non-Medical):   Physical Activity:   . Days of Exercise per Week:   . Minutes of  Exercise per Session:   Stress:   . Feeling of Stress :   Social Connections:   . Frequency of Communication with Friends and Family:   . Frequency of Social Gatherings with Friends and Family:   . Attends Religious Services:   . Active Member of Clubs or Organizations:   . Attends Banker Meetings:   Marland Kitchen Marital Status:   Intimate Partner Violence:   . Fear of Current or Ex-Partner:   . Emotionally Abused:   Marland Kitchen Physically Abused:   . Sexually Abused:     Review of Systems:    Constitutional: No weight loss, fever or chills Skin: No rash  Cardiovascular: No chest pain Respiratory: No SOB  Gastrointestinal: See HPI and otherwise negative Genitourinary: No dysuria Neurological: No headache Musculoskeletal: No new muscle or joint pain Hematologic: No bleeding Psychiatric: No history of depression or anxiety   Physical Exam:  Vital signs: Temp 98.2 F (36.8 C)   Ht 5' 1.32" (1.558 m) Comment: height measured without shoes  Wt (!) 300 lb 6 oz (136.2 kg)   LMP 09/23/1986   BMI 56.17 kg/m   Constitutional:   Pleasant obese AA female appears to be in NAD, Well developed, Well nourished, alert and cooperative Head:  Normocephalic and atraumatic. Eyes:   PEERL, EOMI. No icterus. Conjunctiva pink. Ears:  Normal auditory acuity. Neck:  Supple Throat: Oral cavity and pharynx without inflammation, swelling or lesion.  Respiratory: Respirations even and unlabored. Lungs clear to auscultation bilaterally.   No wheezes, crackles, or rhonchi.  Cardiovascular: Normal S1, S2. No MRG. Regular rate and rhythm. No peripheral edema, cyanosis or pallor.  Gastrointestinal:  Soft, nondistended, nontender. No rebound or guarding. Normal bowel sounds. No appreciable masses or hepatomegaly. Rectal:  Not performed.  Msk:  Symmetrical without gross deformities.  Without edema, no deformity or joint abnormality.  Neurologic:  Alert and  oriented x4;  grossly normal neurologically.  Skin:   Dry and intact without significant lesions or rashes. Psychiatric:  Demonstrates good judgement and reason without abnormal affect or behaviors.  RELEVANT LABS AND IMAGING: CBC Labs (Brief)          Component Value Date/Time   WBC 8.4 11/06/2019 0933   WBC 7.8 02/15/2017 0948   RBC 4.21 11/06/2019 0933   RBC 4.38 02/15/2017 0948   HGB 12.6 11/06/2019 0933   HCT 39.2 11/06/2019 0933   PLT 234 11/06/2019 0933   MCV 93 11/06/2019 0933   MCH 29.9 11/06/2019 0933   MCH 31.1 02/15/2017 0948   MCHC 32.1 11/06/2019 0933   MCHC 32.9 02/15/2017 0948   RDW 14.4 11/06/2019 0933   LYMPHSABS 3.1 09/27/2016 0954   MONOABS 0.4 09/27/2016 0954   EOSABS 0.1 09/27/2016 0954   BASOSABS 0.1 09/27/2016 0954      CMP     Labs (Brief)          Component Value Date/Time   NA 141 11/06/2019 0933   K 4.1 11/06/2019 0933   CL 98 11/06/2019 0933   CO2 31 (H) 11/06/2019 0933   GLUCOSE 112 (H) 11/06/2019 0933   GLUCOSE 110 (H) 09/27/2016 0954   BUN 21 11/06/2019 0933   CREATININE 0.93 11/06/2019 0933   CREATININE 0.93 02/14/2015 0953   CALCIUM 10.1 11/06/2019 0933   CALCIUM 10.5 06/10/2009 1342   PROT 7.7 09/27/2016 0954   ALBUMIN 4.0 09/27/2016 0954   AST 24 09/27/2016 0954   ALT 14 09/27/2016 0954   ALKPHOS 95 09/27/2016 0954  BILITOT 0.3 09/27/2016 0954   GFRNONAA 59 (L) 11/06/2019 0933   GFRNONAA 62 02/14/2015 0953   GFRAA 69 11/06/2019 0933   GFRAA 71 02/14/2015 0953     Iron/TIBC/Ferritin/ %Sat Labs (Brief)          Component Value Date/Time   IRON 36 11/06/2019 0933   TIBC 422 11/06/2019 0933   FERRITIN 18 11/06/2019 0933   IRONPCTSAT 9 (LL) 11/06/2019 0933     Assessment: 1.  Iron deficiency: As above with symptoms of pica and fatigue, last colonoscopy in 2010 with recommendations to repeat in  10 years; consider GI source of blood loss versus other 2.  History of pulmonary embolism on Xarelto  Plan: 1.  Scheduled the patient for an EGD and colonoscopy at the hospital due to her BMI.  These were scheduled with Dr. Russella DarStark.  Did discuss risks, benefits, limitations and alternatives and the patient agrees to proceed.  Patient will be at least 2 weeks out from her Covid vaccines by the time of procedure.  These procedures were scheduled in July.  We will recheck CBC and iron studies in about a month and a half to ensure that they are stable. 2.  Continue iron for now 3.  Patient was advised to hold her Xarelto for 2 days prior to time of procedure.  We will communicate with her prescribing physician to ensure that holding her Xarelto is except for her. 4.  Patient to follow in clinic per recommendations after time of procedures.

## 2020-02-27 ENCOUNTER — Encounter: Payer: Self-pay | Admitting: Gastroenterology

## 2020-02-27 ENCOUNTER — Emergency Department (HOSPITAL_COMMUNITY): Payer: Medicare Other

## 2020-02-27 ENCOUNTER — Other Ambulatory Visit: Payer: Self-pay

## 2020-02-27 ENCOUNTER — Encounter (HOSPITAL_COMMUNITY): Payer: Self-pay | Admitting: Gastroenterology

## 2020-02-27 ENCOUNTER — Inpatient Hospital Stay (HOSPITAL_COMMUNITY)
Admission: EM | Admit: 2020-02-27 | Discharge: 2020-03-07 | DRG: 208 | Disposition: A | Discharge status: Home Health | Payer: Medicare Other | Attending: Internal Medicine | Admitting: Internal Medicine

## 2020-02-27 DIAGNOSIS — E213 Hyperparathyroidism, unspecified: Secondary | ICD-10-CM | POA: Diagnosis present

## 2020-02-27 DIAGNOSIS — D6859 Other primary thrombophilia: Secondary | ICD-10-CM | POA: Diagnosis not present

## 2020-02-27 DIAGNOSIS — Z6841 Body Mass Index (BMI) 40.0 and over, adult: Secondary | ICD-10-CM

## 2020-02-27 DIAGNOSIS — I517 Cardiomegaly: Secondary | ICD-10-CM | POA: Diagnosis not present

## 2020-02-27 DIAGNOSIS — Z83438 Family history of other disorder of lipoprotein metabolism and other lipidemia: Secondary | ICD-10-CM

## 2020-02-27 DIAGNOSIS — Z830 Family history of human immunodeficiency virus [HIV] disease: Secondary | ICD-10-CM

## 2020-02-27 DIAGNOSIS — Z20822 Contact with and (suspected) exposure to covid-19: Secondary | ICD-10-CM | POA: Diagnosis not present

## 2020-02-27 DIAGNOSIS — N179 Acute kidney failure, unspecified: Secondary | ICD-10-CM | POA: Diagnosis present

## 2020-02-27 DIAGNOSIS — Z743 Need for continuous supervision: Secondary | ICD-10-CM | POA: Diagnosis not present

## 2020-02-27 DIAGNOSIS — I5033 Acute on chronic diastolic (congestive) heart failure: Secondary | ICD-10-CM | POA: Diagnosis present

## 2020-02-27 DIAGNOSIS — R404 Transient alteration of awareness: Secondary | ICD-10-CM | POA: Diagnosis not present

## 2020-02-27 DIAGNOSIS — R600 Localized edema: Secondary | ICD-10-CM | POA: Diagnosis present

## 2020-02-27 DIAGNOSIS — I130 Hypertensive heart and chronic kidney disease with heart failure and stage 1 through stage 4 chronic kidney disease, or unspecified chronic kidney disease: Secondary | ICD-10-CM | POA: Diagnosis present

## 2020-02-27 DIAGNOSIS — Z7901 Long term (current) use of anticoagulants: Secondary | ICD-10-CM

## 2020-02-27 DIAGNOSIS — J9690 Respiratory failure, unspecified, unspecified whether with hypoxia or hypercapnia: Secondary | ICD-10-CM | POA: Diagnosis not present

## 2020-02-27 DIAGNOSIS — Z801 Family history of malignant neoplasm of trachea, bronchus and lung: Secondary | ICD-10-CM

## 2020-02-27 DIAGNOSIS — I10 Essential (primary) hypertension: Secondary | ICD-10-CM | POA: Diagnosis not present

## 2020-02-27 DIAGNOSIS — J811 Chronic pulmonary edema: Secondary | ICD-10-CM | POA: Diagnosis not present

## 2020-02-27 DIAGNOSIS — J9811 Atelectasis: Secondary | ICD-10-CM | POA: Diagnosis present

## 2020-02-27 DIAGNOSIS — Z91013 Allergy to seafood: Secondary | ICD-10-CM

## 2020-02-27 DIAGNOSIS — E669 Obesity, unspecified: Secondary | ICD-10-CM | POA: Diagnosis present

## 2020-02-27 DIAGNOSIS — J9601 Acute respiratory failure with hypoxia: Secondary | ICD-10-CM

## 2020-02-27 DIAGNOSIS — J9621 Acute and chronic respiratory failure with hypoxia: Secondary | ICD-10-CM | POA: Diagnosis present

## 2020-02-27 DIAGNOSIS — Z86711 Personal history of pulmonary embolism: Secondary | ICD-10-CM | POA: Diagnosis not present

## 2020-02-27 DIAGNOSIS — E785 Hyperlipidemia, unspecified: Secondary | ICD-10-CM | POA: Diagnosis present

## 2020-02-27 DIAGNOSIS — K5790 Diverticulosis of intestine, part unspecified, without perforation or abscess without bleeding: Secondary | ICD-10-CM | POA: Diagnosis present

## 2020-02-27 DIAGNOSIS — D509 Iron deficiency anemia, unspecified: Secondary | ICD-10-CM | POA: Diagnosis present

## 2020-02-27 DIAGNOSIS — Z8249 Family history of ischemic heart disease and other diseases of the circulatory system: Secondary | ICD-10-CM

## 2020-02-27 DIAGNOSIS — K640 First degree hemorrhoids: Secondary | ICD-10-CM | POA: Diagnosis present

## 2020-02-27 DIAGNOSIS — G9341 Metabolic encephalopathy: Secondary | ICD-10-CM | POA: Diagnosis not present

## 2020-02-27 DIAGNOSIS — E873 Alkalosis: Secondary | ICD-10-CM | POA: Diagnosis present

## 2020-02-27 DIAGNOSIS — Z79899 Other long term (current) drug therapy: Secondary | ICD-10-CM

## 2020-02-27 DIAGNOSIS — G4733 Obstructive sleep apnea (adult) (pediatric): Secondary | ICD-10-CM | POA: Diagnosis present

## 2020-02-27 DIAGNOSIS — G4730 Sleep apnea, unspecified: Secondary | ICD-10-CM | POA: Diagnosis present

## 2020-02-27 DIAGNOSIS — K5730 Diverticulosis of large intestine without perforation or abscess without bleeding: Secondary | ICD-10-CM | POA: Diagnosis present

## 2020-02-27 DIAGNOSIS — R7989 Other specified abnormal findings of blood chemistry: Secondary | ICD-10-CM

## 2020-02-27 DIAGNOSIS — I252 Old myocardial infarction: Secondary | ICD-10-CM

## 2020-02-27 DIAGNOSIS — Z841 Family history of disorders of kidney and ureter: Secondary | ICD-10-CM

## 2020-02-27 DIAGNOSIS — M47814 Spondylosis without myelopathy or radiculopathy, thoracic region: Secondary | ICD-10-CM | POA: Diagnosis not present

## 2020-02-27 DIAGNOSIS — J181 Lobar pneumonia, unspecified organism: Secondary | ICD-10-CM | POA: Diagnosis not present

## 2020-02-27 DIAGNOSIS — I700 Atherosclerosis of aorta: Secondary | ICD-10-CM | POA: Diagnosis not present

## 2020-02-27 DIAGNOSIS — M1990 Unspecified osteoarthritis, unspecified site: Secondary | ICD-10-CM | POA: Diagnosis present

## 2020-02-27 DIAGNOSIS — Z833 Family history of diabetes mellitus: Secondary | ICD-10-CM

## 2020-02-27 DIAGNOSIS — J9622 Acute and chronic respiratory failure with hypercapnia: Principal | ICD-10-CM | POA: Diagnosis present

## 2020-02-27 DIAGNOSIS — R0602 Shortness of breath: Secondary | ICD-10-CM | POA: Diagnosis not present

## 2020-02-27 DIAGNOSIS — E876 Hypokalemia: Secondary | ICD-10-CM | POA: Diagnosis present

## 2020-02-27 DIAGNOSIS — Z87891 Personal history of nicotine dependence: Secondary | ICD-10-CM

## 2020-02-27 DIAGNOSIS — Z886 Allergy status to analgesic agent status: Secondary | ICD-10-CM

## 2020-02-27 DIAGNOSIS — K319 Disease of stomach and duodenum, unspecified: Secondary | ICD-10-CM | POA: Diagnosis present

## 2020-02-27 DIAGNOSIS — R778 Other specified abnormalities of plasma proteins: Secondary | ICD-10-CM

## 2020-02-27 DIAGNOSIS — K2960 Other gastritis without bleeding: Secondary | ICD-10-CM | POA: Diagnosis present

## 2020-02-27 DIAGNOSIS — Z888 Allergy status to other drugs, medicaments and biological substances status: Secondary | ICD-10-CM

## 2020-02-27 DIAGNOSIS — R410 Disorientation, unspecified: Secondary | ICD-10-CM | POA: Diagnosis not present

## 2020-02-27 DIAGNOSIS — Z8261 Family history of arthritis: Secondary | ICD-10-CM

## 2020-02-27 DIAGNOSIS — I499 Cardiac arrhythmia, unspecified: Secondary | ICD-10-CM | POA: Diagnosis not present

## 2020-02-27 DIAGNOSIS — J9600 Acute respiratory failure, unspecified whether with hypoxia or hypercapnia: Secondary | ICD-10-CM | POA: Diagnosis present

## 2020-02-27 DIAGNOSIS — J9602 Acute respiratory failure with hypercapnia: Secondary | ICD-10-CM | POA: Diagnosis not present

## 2020-02-27 DIAGNOSIS — R911 Solitary pulmonary nodule: Secondary | ICD-10-CM | POA: Diagnosis present

## 2020-02-27 DIAGNOSIS — I272 Pulmonary hypertension, unspecified: Secondary | ICD-10-CM | POA: Diagnosis present

## 2020-02-27 DIAGNOSIS — J90 Pleural effusion, not elsewhere classified: Secondary | ICD-10-CM | POA: Diagnosis not present

## 2020-02-27 DIAGNOSIS — Z825 Family history of asthma and other chronic lower respiratory diseases: Secondary | ICD-10-CM

## 2020-02-27 DIAGNOSIS — R4182 Altered mental status, unspecified: Secondary | ICD-10-CM

## 2020-02-27 DIAGNOSIS — K222 Esophageal obstruction: Secondary | ICD-10-CM | POA: Diagnosis present

## 2020-02-27 DIAGNOSIS — J690 Pneumonitis due to inhalation of food and vomit: Secondary | ICD-10-CM | POA: Diagnosis present

## 2020-02-27 DIAGNOSIS — K449 Diaphragmatic hernia without obstruction or gangrene: Secondary | ICD-10-CM | POA: Diagnosis present

## 2020-02-27 DIAGNOSIS — R0902 Hypoxemia: Secondary | ICD-10-CM | POA: Diagnosis not present

## 2020-02-27 DIAGNOSIS — J45909 Unspecified asthma, uncomplicated: Secondary | ICD-10-CM | POA: Diagnosis present

## 2020-02-27 DIAGNOSIS — N289 Disorder of kidney and ureter, unspecified: Secondary | ICD-10-CM | POA: Diagnosis present

## 2020-02-27 LAB — APTT: aPTT: 41 seconds — ABNORMAL HIGH (ref 24–36)

## 2020-02-27 LAB — CBC WITH DIFFERENTIAL/PLATELET
Abs Immature Granulocytes: 0.08 10*3/uL — ABNORMAL HIGH (ref 0.00–0.07)
Basophils Absolute: 0 10*3/uL (ref 0.0–0.1)
Basophils Relative: 0 %
Eosinophils Absolute: 0 10*3/uL (ref 0.0–0.5)
Eosinophils Relative: 0 %
HCT: 48.9 % — ABNORMAL HIGH (ref 36.0–46.0)
Hemoglobin: 14.9 g/dL (ref 12.0–15.0)
Immature Granulocytes: 1 %
Lymphocytes Relative: 20 %
Lymphs Abs: 2.5 10*3/uL (ref 0.7–4.0)
MCH: 31.4 pg (ref 26.0–34.0)
MCHC: 30.5 g/dL (ref 30.0–36.0)
MCV: 102.9 fL — ABNORMAL HIGH (ref 80.0–100.0)
Monocytes Absolute: 0.9 10*3/uL (ref 0.1–1.0)
Monocytes Relative: 7 %
Neutro Abs: 9.2 10*3/uL — ABNORMAL HIGH (ref 1.7–7.7)
Neutrophils Relative %: 72 %
Platelets: 238 10*3/uL (ref 150–400)
RBC: 4.75 MIL/uL (ref 3.87–5.11)
RDW: 16.2 % — ABNORMAL HIGH (ref 11.5–15.5)
WBC: 12.7 10*3/uL — ABNORMAL HIGH (ref 4.0–10.5)
nRBC: 0.2 % (ref 0.0–0.2)

## 2020-02-27 LAB — URINALYSIS, ROUTINE W REFLEX MICROSCOPIC
Bilirubin Urine: NEGATIVE
Glucose, UA: NEGATIVE mg/dL
Hgb urine dipstick: NEGATIVE
Ketones, ur: NEGATIVE mg/dL
Leukocytes,Ua: NEGATIVE
Nitrite: NEGATIVE
Protein, ur: 100 mg/dL — AB
Specific Gravity, Urine: 1.023 (ref 1.005–1.030)
pH: 5 (ref 5.0–8.0)

## 2020-02-27 LAB — BLOOD GAS, VENOUS
Acid-Base Excess: 3.1 mmol/L — ABNORMAL HIGH (ref 0.0–2.0)
Bicarbonate: 37.2 mmol/L — ABNORMAL HIGH (ref 20.0–28.0)
O2 Saturation: 46.4 %
Patient temperature: 98.6
pCO2, Ven: 112 mmHg (ref 44.0–60.0)
pH, Ven: 7.147 — CL (ref 7.250–7.430)
pO2, Ven: 37.4 mmHg (ref 32.0–45.0)

## 2020-02-27 LAB — BLOOD GAS, ARTERIAL
Acid-Base Excess: 4.5 mmol/L — ABNORMAL HIGH (ref 0.0–2.0)
Bicarbonate: 34.1 mmol/L — ABNORMAL HIGH (ref 20.0–28.0)
Drawn by: 44126
FIO2: 50
MECHVT: 300 mL
O2 Saturation: 84.5 %
Patient temperature: 98.6
RATE: 18 resp/min
pCO2 arterial: 79.6 mmHg (ref 32.0–48.0)
pH, Arterial: 7.255 — ABNORMAL LOW (ref 7.350–7.450)
pO2, Arterial: 61.1 mmHg — ABNORMAL LOW (ref 83.0–108.0)

## 2020-02-27 LAB — COMPREHENSIVE METABOLIC PANEL
ALT: 15 U/L (ref 0–44)
AST: 28 U/L (ref 15–41)
Albumin: 1.5 g/dL — ABNORMAL LOW (ref 3.5–5.0)
Alkaline Phosphatase: 42 U/L (ref 38–126)
Anion gap: 4 — ABNORMAL LOW (ref 5–15)
BUN: 9 mg/dL (ref 8–23)
CO2: 19 mmol/L — ABNORMAL LOW (ref 22–32)
Calcium: 4.2 mg/dL — CL (ref 8.9–10.3)
Chloride: 123 mmol/L — ABNORMAL HIGH (ref 98–111)
Creatinine, Ser: 0.45 mg/dL (ref 0.44–1.00)
GFR calc Af Amer: >60 mL/min (ref >60)
GFR calc non Af Amer: >60 mL/min (ref >60)
Glucose, Bld: 66 mg/dL — ABNORMAL LOW (ref 70–99)
Potassium: 2 mmol/L — CL (ref 3.5–5.1)
Sodium: 146 mmol/L — ABNORMAL HIGH (ref 135–145)
Total Bilirubin: <0.1 mg/dL — ABNORMAL LOW (ref 0.3–1.2)
Total Protein: 3.2 g/dL — ABNORMAL LOW (ref 6.5–8.1)

## 2020-02-27 LAB — SARS CORONAVIRUS 2 BY RT PCR (HOSPITAL ORDER, PERFORMED IN ~~LOC~~ HOSPITAL LAB): SARS Coronavirus 2: NEGATIVE

## 2020-02-27 LAB — SURGICAL PATHOLOGY

## 2020-02-27 LAB — TROPONIN I (HIGH SENSITIVITY): Troponin I (High Sensitivity): 258 ng/L (ref <18)

## 2020-02-27 LAB — HEPARIN LEVEL (UNFRACTIONATED): Heparin Unfractionated: <0.1 IU/mL — ABNORMAL LOW (ref 0.30–0.70)

## 2020-02-27 LAB — BRAIN NATRIURETIC PEPTIDE: B Natriuretic Peptide: 424.3 pg/mL — ABNORMAL HIGH (ref 0.0–100.0)

## 2020-02-27 MED ORDER — ETOMIDATE 2 MG/ML IV SOLN
20.0000 mg | Freq: Once | INTRAVENOUS | Status: AC
  Administered 2020-02-27: 20 mg via INTRAVENOUS
  Filled 2020-02-27: qty 10

## 2020-02-27 MED ORDER — POTASSIUM CHLORIDE 10 MEQ/100ML IV SOLN
10.0000 meq | INTRAVENOUS | Status: AC
  Administered 2020-02-27 – 2020-02-28 (×4): 10 meq via INTRAVENOUS
  Filled 2020-02-27 (×5): qty 100

## 2020-02-27 MED ORDER — HEPARIN (PORCINE) 25000 UT/250ML-% IV SOLN
1500.0000 [IU]/h | INTRAVENOUS | Status: AC
  Administered 2020-02-27 – 2020-02-28 (×2): 1500 [IU]/h via INTRAVENOUS
  Filled 2020-02-27 (×2): qty 250

## 2020-02-27 MED ORDER — SODIUM CHLORIDE 0.9 % IV BOLUS
1000.0000 mL | Freq: Once | INTRAVENOUS | Status: AC
  Administered 2020-02-27: 1000 mL via INTRAVENOUS

## 2020-02-27 MED ORDER — POTASSIUM CHLORIDE 10 MEQ/100ML IV SOLN
10.0000 meq | INTRAVENOUS | Status: AC
  Administered 2020-02-27: 10 meq via INTRAVENOUS
  Filled 2020-02-27 (×2): qty 100

## 2020-02-27 MED ORDER — LACTATED RINGERS IV SOLN: INTRAVENOUS | Status: DC

## 2020-02-27 MED ORDER — MIDAZOLAM HCL 2 MG/2ML IJ SOLN
2.0000 mg | Freq: Once | INTRAMUSCULAR | Status: AC
  Administered 2020-02-27: 2 mg via INTRAVENOUS
  Filled 2020-02-27: qty 2

## 2020-02-27 MED ORDER — FENTANYL CITRATE (PF) 100 MCG/2ML IJ SOLN
50.0000 ug | Freq: Once | INTRAMUSCULAR | Status: AC
  Administered 2020-02-27: 50 ug via INTRAVENOUS
  Filled 2020-02-27: qty 2

## 2020-02-27 MED ORDER — FENTANYL CITRATE (PF) 100 MCG/2ML IJ SOLN
50.0000 ug | INTRAMUSCULAR | Status: DC | PRN
  Administered 2020-02-27: 50 ug via INTRAVENOUS
  Filled 2020-02-27: qty 2

## 2020-02-27 MED ORDER — SODIUM CHLORIDE 0.9 % IV SOLN
3.0000 g | Freq: Four times a day (QID) | INTRAVENOUS | Status: DC
  Administered 2020-02-27 – 2020-03-04 (×22): 3 g via INTRAVENOUS
  Filled 2020-02-27: qty 3
  Filled 2020-02-27 (×2): qty 8
  Filled 2020-02-27: qty 3
  Filled 2020-02-27 (×3): qty 8
  Filled 2020-02-27: qty 3
  Filled 2020-02-27 (×2): qty 8
  Filled 2020-02-27 (×4): qty 3
  Filled 2020-02-27 (×4): qty 8
  Filled 2020-02-27: qty 3
  Filled 2020-02-27 (×6): qty 8
  Filled 2020-02-27: qty 3

## 2020-02-27 MED ORDER — DOCUSATE SODIUM 100 MG PO CAPS: 100.0000 mg | ORAL_CAPSULE | Freq: Two times a day (BID) | ORAL | Status: DC | PRN

## 2020-02-27 MED ORDER — MIDAZOLAM HCL 2 MG/2ML IJ SOLN
1.0000 mg | INTRAMUSCULAR | Status: DC | PRN
  Administered 2020-02-27: 1 mg via INTRAVENOUS
  Filled 2020-02-27: qty 2

## 2020-02-27 MED ORDER — FENTANYL BOLUS VIA INFUSION
25.0000 ug | INTRAVENOUS | Status: DC | PRN
  Filled 2020-02-27: qty 25

## 2020-02-27 MED ORDER — SUCCINYLCHOLINE CHLORIDE 20 MG/ML IJ SOLN
150.0000 mg | Freq: Once | INTRAMUSCULAR | Status: AC
  Administered 2020-02-27: 150 mg via INTRAVENOUS
  Filled 2020-02-27: qty 7.5

## 2020-02-27 MED ORDER — MIDAZOLAM 50MG/50ML (1MG/ML) PREMIX INFUSION
0.5000 mg/h | INTRAVENOUS | Status: DC
  Administered 2020-02-27 – 2020-02-28 (×2): 0.5 mg/h via INTRAVENOUS
  Filled 2020-02-27 (×3): qty 50

## 2020-02-27 MED ORDER — POLYETHYLENE GLYCOL 3350 17 G PO PACK: 17.0000 g | PACK | Freq: Every day | ORAL | Status: DC | PRN

## 2020-02-27 MED ORDER — FENTANYL CITRATE (PF) 100 MCG/2ML IJ SOLN: 25.0000 ug | Freq: Once | INTRAMUSCULAR | Status: DC

## 2020-02-27 MED ORDER — SODIUM CHLORIDE 0.9 % IV SOLN: 500.0000 mg | INTRAVENOUS | Status: DC

## 2020-02-27 MED ORDER — MIDAZOLAM HCL 2 MG/2ML IJ SOLN: 1.0000 mg | INTRAMUSCULAR | Status: DC | PRN

## 2020-02-27 MED ORDER — SODIUM CHLORIDE 0.9 % IV SOLN: 2.0000 g | INTRAVENOUS | Status: DC

## 2020-02-27 MED ORDER — PANTOPRAZOLE SODIUM 40 MG IV SOLR
40.0000 mg | Freq: Every day | INTRAVENOUS | Status: DC
  Administered 2020-02-27 – 2020-03-01 (×4): 40 mg via INTRAVENOUS
  Filled 2020-02-27 (×5): qty 40

## 2020-02-27 MED ORDER — IOHEXOL 350 MG/ML SOLN
100.0000 mL | Freq: Once | INTRAVENOUS | Status: AC | PRN
  Administered 2020-02-27: 100 mL via INTRAVENOUS

## 2020-02-27 MED ORDER — FENTANYL 2500MCG IN NS 250ML (10MCG/ML) PREMIX INFUSION
0.0000 ug/h | INTRAVENOUS | Status: DC
  Administered 2020-02-27: 25 ug/h via INTRAVENOUS
  Administered 2020-02-28: 50 ug/h via INTRAVENOUS
  Filled 2020-02-27: qty 250

## 2020-02-27 MED ORDER — POLYETHYLENE GLYCOL 3350 17 G PO PACK
17.0000 g | PACK | Freq: Every day | ORAL | Status: DC
  Filled 2020-02-27: qty 1

## 2020-02-27 MED ORDER — FENTANYL 2500MCG IN NS 250ML (10MCG/ML) PREMIX INFUSION: 25.0000 ug/h | INTRAVENOUS | Status: DC

## 2020-02-27 MED ORDER — DOCUSATE SODIUM 50 MG/5ML PO LIQD
100.0000 mg | Freq: Two times a day (BID) | ORAL | Status: DC
  Filled 2020-02-27 (×2): qty 10

## 2020-02-27 NOTE — Progress Notes (Signed)
PT on 15 LPM non rebreather and 15 LPM nasal cannula. RT removed flap on nonrebreather to create partial rebreather. Requested RN remove partial rebreather if Sp02 acceptable.

## 2020-02-27 NOTE — Progress Notes (Signed)
Patient placed on NIV (BiPAP) per MD request. VBG results 7.147/112/37.4/37.2/46%. RT concerns expressed to PA and MD about patient alertness. PA and MD assessed patient and wish to continue with plan of BiPAP at this time; if patient shows no improvement or further decline, potential plan to intubate.

## 2020-02-27 NOTE — ED Notes (Signed)
Pt to CT with this RN.

## 2020-02-27 NOTE — Progress Notes (Addendum)
Current Sp02 97% on partial rebreather mask and 15 LPM standard nasal cannula. RT removed partial rebreather and standard nasal cannula and placed PT on 15 LPM high flow nasal cannula ([place]). MD sp02 goal >=92%. Requested RN titrate when Sp02 appropriate.

## 2020-02-27 NOTE — Progress Notes (Signed)
Notified Lab that VBG being sent for analysis.

## 2020-02-27 NOTE — Progress Notes (Signed)
ANTICOAGULATION CONSULT NOTE - Initial Consult  Pharmacy Consult for IV Heparin (transition off of Xarelto) Indication: pulmonary embolus  Allergies  Allergen Reactions  . Hydralazine Anaphylaxis and Other (See Comments)    Swelling tongue.  Marland Kitchen Lisinopril Swelling and Other (See Comments)    REACTION: angioedema Tongue swelling  . Aspirin Other (See Comments)    REACTION: hives  . Crab [Shellfish Allergy] Hives and Other (See Comments)    Can eat shrimp and fish and lobster    Patient Measurements: Height: 5\' 1"  (154.9 cm) IBW/kg (Calculated) : 47.8 Heparin Dosing Weight: 82.5 kg  Vital Signs: Temp: 99.3 F (37.4 C) (07/27 1940) Temp Source: Oral (07/27 1551) BP: 128/79 (07/27 1940) Pulse Rate: 70 (07/27 1940)  Labs: Recent Labs    02/27/20 1352 02/27/20 1455  HGB 14.9  --   HCT 48.9*  --   PLT 238  --   CREATININE  --  0.45  TROPONINIHS 258*  --     Estimated Creatinine Clearance: 76.8 mL/min (by C-G formula based on SCr of 0.45 mg/dL).   Medical History: Past Medical History:  Diagnosis Date  . Arthritis    left knee  and left shoulder   . Asthma   . 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 Diverticulosis   . Hyperlipidemia   . Hyperparathyroidism   . Hypertension   . IDA (iron deficiency anemia)   . Kidney atrophy   . Myocardial infarction (HCC)   . Obesity   . Pica in adults   . Pulmonary embolism (HCC)    Provoked 2013, Unprovoked 2017  . Pulmonary nodule    69mm, stable 2013 to 2017, benign  . Sleep apnea    CPAP  . Small bowel obstruction (HCC)     Medications:  Scheduled:  . docusate  100 mg Oral BID  . fentaNYL (SUBLIMAZE) injection  25 mcg Intravenous Once  . pantoprazole (PROTONIX) IV  40 mg Intravenous QHS  . [START ON 02/28/2020] polyethylene glycol  17 g Oral Daily   Infusions:  . ampicillin-sulbactam (UNASYN) IV    . fentaNYL infusion INTRAVENOUS 25 mcg/hr (02/27/20 1735)  . lactated ringers    .  midazolam 0.5 mg/hr (02/27/20 1730)  . potassium chloride Stopped (02/27/20 1840)  . potassium chloride      Assessment: 79 yo female presented to ED with AMS and then underwent intubation per CCM. Patient with hx PE on Xarelto but prescribed dose is 10mg  once daily and not approved 20mg  once daily dosing per PI. Also note patient with CKD so this could be reason patient is on lower Xarelto dosing. Patient had upper endoscopy with colonoscopy yesterday 7/26 due to IDA to investigate any GI source of blood loss. Per notes, patient was instructed to hold Xarelto 2 days prior to colonoscopy. Due to patient being intubated and med rec tech unable to know when patient took last dose of Xarelto. Prescription for Xarelto on med hx states however for patient to take Xarelto 10mg  daily at 2200 starting 7/26 yesterday so the last dose of Xarelto was likely last night. RN is drawing baseline aPTT and HL now.   Goal of Therapy:  Heparin level 0.3-0.7 units/ml aPTT 66-102 seconds Monitor platelets by anticoagulation protocol: Yes   Plan:   NO IV Heparin bolus due to unknown when pt took last dose of Xarelto as per above  Start IV heparin at dose of 1500 units/hr to begin at 2200  Check aPTT and HL tomorrow with AM labs  Daily CBC  Berkley Harvey 02/27/2020,8:28 PM

## 2020-02-27 NOTE — Progress Notes (Signed)
Pharmacy Antibiotic Note  Tina Chase is a 79 y.o. female admitted on 02/27/2020 with aspiration PNA.  Pharmacy has been consulted for Unasyn dosing.  Plan: Unasyn 3g IV q6 per current renal function  Height: 5\' 1"  (154.9 cm) IBW/kg (Calculated) : 47.8  Temp (24hrs), Avg:98.4 F (36.9 C), Min:97.4 F (36.3 C), Max:99.4 F (37.4 C)  Recent Labs  Lab 02/27/20 1352 02/27/20 1455  WBC 12.7*  --   CREATININE  --  0.45    Estimated Creatinine Clearance: 76.8 mL/min (by C-G formula based on SCr of 0.45 mg/dL).    Allergies  Allergen Reactions  . Hydralazine Anaphylaxis and Other (See Comments)    Swelling tongue.  02/29/20 Lisinopril Swelling and Other (See Comments)    REACTION: angioedema Tongue swelling  . Aspirin Other (See Comments)    REACTION: hives  . Crab [Shellfish Allergy] Hives and Other (See Comments)    Can eat shrimp and fish and lobster      Thank you for allowing pharmacy to be a part of this patient's care.  Marland Kitchen 02/27/2020 7:30 PM

## 2020-02-27 NOTE — ED Notes (Signed)
Timeout with Stevie Kern, MD and caitlyn, PA. Bolus in progress, suction set up, ambu bag set up, respiratory at bedside, and crash cart at bedside.

## 2020-02-27 NOTE — H&P (Addendum)
NAME:  Tina Chase, MRN:  [id number], DOB:  April 19, 1941, LOS: 0 ADMISSION DATE:  02/27/2020, CONSULTATION DATE: 02/27/2020 REFERRING MD: EDP, CHIEF COMPLAINT: Hypoxic, hypercarbic respiratory failure  Brief History   79 year old with history of PE on Xarelto, hypertension, hyperlipidemia, obesity, sleep apnea Admitted with hypoxic, hypercarbic respiratory failure Underwent colonoscopy yesterday and had been feeling weak with desats.  Today noted to be confused at home Intubated in the ED with altered mental status   Past Medical History    has a past medical history of Arthritis, Asthma, Chronic kidney disease, Diverticulosis, Hyperlipidemia, Hyperparathyroidism, Hypertension, IDA (iron deficiency anemia), Kidney atrophy, Myocardial infarction ([place]), Obesity, Pica in adults, Pulmonary embolism ([place]), Pulmonary nodule, Sleep apnea, and Small bowel obstruction ([place]).  Significant Hospital Events   7/27 Admit  Consults:  PCCM  Procedures:    Significant Diagnostic Tests:  CT head CTA chest  Micro Data:    Antimicrobials:  Unasyn  Interim history/subjective:    Objective   Blood pressure (!) 94/60, pulse 71, temperature (!) 97.5 F (36.4 C), temperature source Oral, resp. rate 18, height 5' 1"  (1.549 m), last menstrual period 09/23/1986, SpO2 93 %.    Vent Mode: PRVC FiO2 (%):  [50 %] 50 % Set Rate:  [16 bmp-18 bmp] 18 bmp Vt Set:  [380 mL] 380 mL PEEP:  [8 cmH20] 8 cmH20 Plateau Pressure:  [26 cmH20] 26 cmH20  No intake or output data in the 24 hours ending 02/27/20 1850 There were no vitals filed for this visit.  Examination: Gen:      No acute distress, obese HEENT:  EOMI, sclera anicteric, ET tube Neck:     No masses; no thyromegaly Lungs:    Clear to auscultation bilaterally; normal respiratory effort CV:         Regular rate and rhythm; no murmurs Abd:      + bowel sounds; soft, non-tender; no palpable masses, no distension Ext:    2+ chronic edema;  adequate peripheral perfusion Skin:      Warm and dry; no rash Neuro: Sedated  Resolved Hospital Problem list     Assessment & Plan:  Acute hypoxic, hypercarbic respiratory failure Baseline OSA Unclear etiology. May have aspirated during procedure or not used her CPAP at night due to weakness post colonoscopy yesterday CT head, CTA chest Start empiric antibitotics, check cultures SBT's tomorrow if mental status improves  History of pulmonary embolism, on Eliquis Start heparin drip  Hypertension, hyperlipidemia Holding outpatient meds  Best practice:  Diet: NPO Pain/Anxiety/Delirium protocol (if indicated): Fentanyl, versed gtt VAP protocol (if indicated): Ordered DVT prophylaxis: Heparin gtt GI prophylaxis: PPI Glucose control: Monitor Mobility: Bed Code Status: Full Family Communication: Daughter updated at bedside in ED Disposition: ICU  Labs   CBC: Recent Labs  Lab 02/27/20 1352  WBC 12.7*  NEUTROABS 9.2*  HGB 14.9  HCT 48.9*  MCV 102.9*  PLT 182    Basic Metabolic Panel: Recent Labs  Lab 02/27/20 1455  NA 146*  K 2.0*  CL 123*  CO2 19*  GLUCOSE 66*  BUN 9  CREATININE 0.45  CALCIUM 4.2*   GFR: Estimated Creatinine Clearance: 76.8 mL/min (by C-G formula based on SCr of 0.45 mg/dL). Recent Labs  Lab 02/27/20 1352  WBC 12.7*    Liver Function Tests: Recent Labs  Lab 02/27/20 1455  AST 28  ALT 15  ALKPHOS 42  BILITOT <0.1*  PROT 3.2*  ALBUMIN 1.5*   No results for input(s): LIPASE, AMYLASE in  the last 168 hours. No results for input(s): AMMONIA in the last 168 hours.  ABG    Component Value Date/Time   PHART 7.331 (L) 12/10/2011 1226   PCO2ART 52.0 (H) 12/10/2011 1226   PO2ART 57.8 (L) 12/10/2011 1226   HCO3 37.2 (H) 02/27/2020 1412   TCO2 28 11/06/2015 1315   O2SAT 46.4 02/27/2020 1412     Coagulation Profile: No results for input(s): INR, PROTIME in the last 168 hours.  Cardiac Enzymes: No results for input(s): CKTOTAL,  CKMB, CKMBINDEX, TROPONINI in the last 168 hours.  HbA1C: Hemoglobin A1C  Date/Time Value Ref Range Status  04/10/2014 09:32 AM 5.5  Final  09/23/2010 12:00 AM 5.6  Final   Hgb A1c MFr Bld  Date/Time Value Ref Range Status  07/06/2016 09:08 AM 5.4 4.8 - 5.6 % Final    Comment:             Pre-diabetes: 5.7 - 6.4          Diabetes: >6.4          Glycemic control for adults with diabetes: <7.0     CBG: No results for input(s): GLUCAP in the last 168 hours.  Review of Systems:   Unable to obtain due to altered mental status  Past Medical History  She,  has a past medical history of Arthritis, Asthma, Chronic kidney disease, Diverticulosis, Hyperlipidemia, Hyperparathyroidism, Hypertension, IDA (iron deficiency anemia), Kidney atrophy, Myocardial infarction ([place]), Obesity, Pica in adults, Pulmonary embolism ([place]), Pulmonary nodule, Sleep apnea, and Small bowel obstruction ([place]).   Surgical History    Past Surgical History:  Procedure Laterality Date  . APPLICATION OF WOUND VAC  12/04/2011   Procedure: APPLICATION OF WOUND VAC;  Surgeon: Odis Hollingshead, MD;  Location: [facility];  Service: General;;  x two  . BIOPSY  02/26/2020   Procedure: BIOPSY;  Surgeon: Ladene Artist, MD;  Location: [facility];  Service: Endoscopy;;  . COLONOSCOPY WITH PROPOFOL N/A 02/26/2020   Procedure: COLONOSCOPY WITH PROPOFOL;  Surgeon: Ladene Artist, MD;  Location: [facility];  Service: Endoscopy;  Laterality: N/A;  . DILATION AND CURETTAGE OF UTERUS     D and C x 3   . ESOPHAGOGASTRODUODENOSCOPY (EGD) WITH PROPOFOL N/A 02/26/2020   Procedure: ESOPHAGOGASTRODUODENOSCOPY (EGD) WITH PROPOFOL;  Surgeon: Ladene Artist, MD;  Location: [facility];  Service: Endoscopy;  Laterality: N/A;  . INCISIONAL HERNIA REPAIR  12/04/2011   Procedure: HERNIA REPAIR INCISIONAL;  Surgeon: Odis Hollingshead, MD;  Location: [facility];  Service: General;  Laterality: N/A;  . KIDNEY CYST REMOVAL    . LAPAROSCOPIC  NEPHRECTOMY  08/19/2011   Procedure: LAPAROSCOPIC NEPHRECTOMY;  Surgeon: Molli Hazard, MD;  Location: [facility];  Service: Urology;  Laterality: Right;  . LAPAROTOMY  12/04/2011   Procedure: EXPLORATORY LAPAROTOMY;  Surgeon: Odis Hollingshead, MD;  Location: [facility];  Service: General;  Laterality: N/A;   bowel resection   . TUBAL LIGATION       Social History   reports that she quit smoking about 51 years ago. Her smoking use included cigarettes. She has a 0.50 pack-year smoking history. She has never used smokeless tobacco. She reports that she does not drink alcohol and does not use drugs.   Family History   Her family history includes Arthritis in her maternal grandmother; Asthma in her son; Cataracts in her daughter; Diabetes in her brother and sister; Emphysema in her sister; HIV/AIDS in her brother; Heart attack (age of onset:  31) in her brother; Heart attack (age of onset: 26) in her father; Heart disease in her brother, father, and paternal grandmother; Hyperlipidemia in her mother; Hypertension in her brother, brother, daughter, maternal grandmother, mother, paternal grandmother, sister, sister, sister, and sister; Kidney disease in her brother and brother; Lung cancer in her brother. There is no history of Stroke, Cancer, Nephrolithiasis, or Breast cancer.   Allergies Allergies  Allergen Reactions  . Hydralazine Anaphylaxis and Other (See Comments)    Swelling tongue.  Marland Kitchen Lisinopril Swelling and Other (See Comments)    REACTION: angioedema Tongue swelling  . Aspirin Other (See Comments)    REACTION: hives  . Crab [Shellfish Allergy] Hives and Other (See Comments)    Can eat shrimp and fish and lobster     Home Medications  Prior to Admission medications   Medication Sig Start Date End Date Taking? Authorizing Provider  acetaminophen (TYLENOL) 325 MG tablet Take 975 mg by mouth every 6 (six) hours as needed for moderate pain or headache.    [provider]    albuterol (VENTOLIN HFA) 108 (90 Base) MCG/ACT inhaler INHALE 2 PUFFS INTO THE LUNGS EVERY 4 HOURS AS NEEDED FOR WHEEZING OR SHORTNESS OF BREATH 02/26/20   Axel Filler, MD  allopurinol (ZYLOPRIM) 300 MG tablet Take 300 mg by mouth at bedtime.  05/17/15   [provider]  amLODipine (NORVASC) 10 MG tablet Take 1 tablet (10 mg total) by mouth daily. Patient taking differently: Take 10 mg by mouth at bedtime.  02/01/20   Axel Filler, MD  carvedilol (COREG) 25 MG tablet Take 1 tablet (25 mg total) by mouth 2 (two) times daily. 11/09/19 11/08/20  Axel Filler, MD  FLOVENT HFA 44 MCG/ACT inhaler Inhale 2 puffs into the lungs daily. 02/26/20   Axel Filler, MD  furosemide (LASIX) 80 MG tablet Take 1 tablet (80 mg total) by mouth daily. 02/01/20   Axel Filler, MD  Guaifenesin [facility] MAXIMUM STRENGTH) 1200 MG TB12 Take 1 tablet by mouth daily.    [provider]  iron polysaccharides (NU-IRON) 150 MG capsule Take 1 capsule (150 mg total) by mouth daily. Patient not taking: Reported on 02/13/2020 11/09/19   Axel Filler, MD  loperamide (IMODIUM) 2 MG capsule TAKE 1 CAPSULE BY MOUTH  EVERY 6 HOURS AS NEEDED FOR DIARRHEA OR LOOSE STOOLS 01/11/19   Axel Filler, MD  Multiple Vitamins-Minerals (PRESERVISION AREDS 2 PO) Take 2 tablets by mouth daily.    [provider]  pantoprazole (PROTONIX) 40 MG tablet Take 1 tablet (40 mg total) by mouth daily. 02/26/20   Ladene Artist, MD  Potassium Citrate 15 MEQ (1620 MG) TBCR Take 1 tablet by mouth 2 (two) times daily. 02/26/20   Axel Filler, MD  rivaroxaban (XARELTO) 10 MG TABS tablet Take 1 tablet (10 mg total) by mouth daily at 10 pm. 02/26/20   Axel Filler, MD  Sodium Sulfate-Mag Sulfate-KCl (SUTAB) [phone] MG TABS Take 1 kit by mouth as directed. BIN: [id number] PCN: CN GROUP: [id number] MEMBER ID: [id number]; DO NOT RUN AS CASH 12/01/19   Levin Erp, PA     Critical care time:   The patient is critically ill with multiple organ system failure and requires high complexity decision making for assessment and support, frequent evaluation and titration of therapies, advanced monitoring, review of radiographic studies and interpretation of complex data.   Critical Care Time devoted to patient care services, exclusive of  separately billable procedures, described in this note is 35 minutes.   Marshell Garfinkel MD [facility] Please see [email or web address] for pager details.  02/27/2020, 7:12 PM

## 2020-02-27 NOTE — Progress Notes (Signed)
RTx 2 attempted to obtain ABG and were unsuccessful. Spoke w/ PA, order changed to VBG.

## 2020-02-27 NOTE — ED Triage Notes (Signed)
EMS reports from home, Pt found AMS and hypoxic. Pt states she was unable to get out of bed this morning. Normally on 2 lts 02 PRN. Arrived on Sp02 90 on non rebreather @ 15 lts per EMS.  BP 108/74 HR 80 RR 24 Sp02 90@ 15lts non rebreather CGB 159

## 2020-02-27 NOTE — ED Notes (Signed)
23 at the lip

## 2020-02-27 NOTE — ED Provider Notes (Signed)
[facility] DEPT Provider Note   CSN: [id number] Arrival date & time: 02/27/20  1258     History Chief Complaint  Patient presents with   Shortness of Breath   Altered Mental Status    Tina Chase is a 79 y.o. female.  HPI     79 year old female, the PMH of PE on Xarelto, HTN, HLD, morbidly obese, sleep apnea, 2 L nasal cannula as needed, presents from assisted living after being found by family.  Per EMS report patient's was last known normal yesterday, talking appropriately.  Today she was found by family and was not making sense.  EMS was called and her oxygen was noted in the 70s.  Patient was placed on nasal cannula and oxygen did not improve so then she was placed on a nonrebreather.  On initial evaluation patient will answer yes or no questions and follow commands.  Patient does state shortness of breath but denies any fevers, cough, chest pain.  Patient states she has been taking her Xarelto.  Per chart review patient had a colonoscopy and endoscopy yesterday.  Per note from GI doctor her Xarelto was held for 2 days for colonoscopy procedure.   Past Medical History:  Diagnosis Date   Arthritis    left knee  and left shoulder    Asthma    Chronic kidney disease    right non functioning kidney    Diverticulosis    Hyperlipidemia    Hyperparathyroidism    Hypertension    IDA (iron deficiency anemia)    Kidney atrophy    Myocardial infarction ([place])    Obesity    Pica in adults    Pulmonary embolism ([place])    Provoked 2013, Unprovoked 2017   Pulmonary nodule    54m, stable 2013 to 2017, benign   Sleep apnea    CPAP   Small bowel obstruction ([facility]     Patient Active Problem List   Diagnosis Date Noted   Iron deficiency anemia    Erosive gastritis    Abdominal hernia 12/11/2019   Iron deficiency 11/07/2019   Hypercoagulable state ([place]    Sleep apnea 07/08/2015   Bilateral lower extremity edema  01/09/2014   Hyperuricemia 05/25/2013   Nephrolithiasis 05/25/2013   Hypertension 12/22/2011   Obesity, Class III, BMI 40-49.9 (morbid obesity) ([place] 12/22/2011   Asthma 12/04/2011   Single kidney 09/15/2011   Routine health maintenance 09/15/2011    Past Surgical History:  Procedure Laterality Date   APPLICATION OF WOUND VAC  12/04/2011   Procedure: APPLICATION OF WOUND VAC;  Surgeon: TOdis Hollingshead MD;  [facility];  Service: General;;  x two   BIOPSY  02/26/2020   Procedure: BIOPSY;  Surgeon: SLadene Artist MD;  [facility];  Service: Endoscopy;;   COLONOSCOPY WITH PROPOFOL N/A 02/26/2020   Procedure: COLONOSCOPY WITH PROPOFOL;  Surgeon: SLadene Artist MD;  [facility];  Service: Endoscopy;  Laterality: N/A;   DILATION AND CURETTAGE OF UTERUS     D and C x 3    ESOPHAGOGASTRODUODENOSCOPY (EGD) WITH PROPOFOL N/A 02/26/2020   Procedure: ESOPHAGOGASTRODUODENOSCOPY (EGD) WITH PROPOFOL;  Surgeon: SLadene Artist MD;  [facility];  Service: Endoscopy;  Laterality: N/A;   INCISIONAL HERNIA REPAIR  12/04/2011   Procedure: HERNIA REPAIR INCISIONAL;  Surgeon: TOdis Hollingshead MD;  [facility]S;  Service: General;  Laterality: N/A;   KIDNEY CYST REMOVAL     LAPAROSCOPIC NEPHRECTOMY  08/19/2011  Procedure: LAPAROSCOPIC NEPHRECTOMY;  Surgeon: Molli Hazard, MD;  Location[facility];  Service: Urology;  Laterality: Right;   LAPAROTOMY  12/04/2011   Procedure: EXPLORATORY LAPAROTOMY;  Surgeon: Odis Hollingshead, MD;  [facility];  Service: General;  Laterality: N/A;   bowel resection    TUBAL LIGATION       OB History   No obstetric history on file.     Family History  Problem Relation Age of Onset   Hyperlipidemia Mother    Hypertension Mother    Heart disease Father        had a heart attack at age 69   Heart attack Father 49   Heart disease Brother        at age 33   Heart attack Brother 33    Diabetes Brother    Kidney disease Brother    Hypertension Brother    Hypertension Sister    Diabetes Sister    Hypertension Maternal Grandmother    Arthritis Maternal Grandmother    Heart disease Paternal Grandmother    Hypertension Paternal Grandmother    Kidney disease Brother    HIV/AIDS Brother    Hypertension Brother    Lung cancer Brother    Hypertension Sister    Hypertension Sister    Emphysema Sister    Hypertension Sister    Asthma Son    Cataracts Daughter    Hypertension Daughter    Stroke Neg Hx    Cancer Neg Hx    Nephrolithiasis Neg Hx    Breast cancer Neg Hx     Social History   Tobacco Use   Smoking status: Former Smoker    Packs/day: 0.50    Years: 1.00    Pack years: 0.50    Types: Cigarettes    Quit date: 08/03/1968    Years since quitting: 51.6   Smokeless tobacco: Never Used  Vaping Use   Vaping Use: Never used  Substance Use Topics   Alcohol use: No    Alcohol/week: 0.0 standard drinks   Drug use: No    Home Medications Prior to Admission medications   Medication Sig Start Date End Date Taking? Authorizing Provider  acetaminophen (TYLENOL) 325 MG tablet Take 975 mg by mouth every 6 (six) hours as needed for moderate pain or headache.    [provider]  albuterol (VENTOLIN HFA) 108 (90 Base) MCG/ACT inhaler INHALE 2 PUFFS INTO THE LUNGS EVERY 4 HOURS AS NEEDED FOR WHEEZING OR SHORTNESS OF BREATH 02/26/20   Axel Filler, MD  allopurinol (ZYLOPRIM) 300 MG tablet Take 300 mg by mouth at bedtime.  05/17/15   [provider]  amLODipine (NORVASC) 10 MG tablet Take 1 tablet (10 mg total) by mouth daily. Patient taking differently: Take 10 mg by mouth at bedtime.  02/01/20   Axel Filler, MD  carvedilol (COREG) 25 MG tablet Take 1 tablet (25 mg total) by mouth 2 (two) times daily. 11/09/19 11/08/20  Axel Filler, MD  FLOVENT HFA 44 MCG/ACT inhaler Inhale 2 puffs into the lungs  daily. 02/26/20   Axel Filler, MD  furosemide (LASIX) 80 MG tablet Take 1 tablet (80 mg total) by mouth daily. 02/01/20   Axel Filler, MD  Guaifenesin [facility] MAXIMUM STRENGTH) 1200 MG TB12 Take 1 tablet by mouth daily.    [provider]  iron polysaccharides (NU-IRON) 150 MG capsule Take 1 capsule (150 mg total) by mouth daily. Patient not taking: Reported on 02/13/2020  11/09/19   Axel Filler, MD  loperamide (IMODIUM) 2 MG capsule TAKE 1 CAPSULE BY MOUTH  EVERY 6 HOURS AS NEEDED FOR DIARRHEA OR LOOSE STOOLS 01/11/19   Axel Filler, MD  Multiple Vitamins-Minerals (PRESERVISION AREDS 2 PO) Take 2 tablets by mouth daily.    [provider]  pantoprazole (PROTONIX) 40 MG tablet Take 1 tablet (40 mg total) by mouth daily. 02/26/20   Ladene Artist, MD  Potassium Citrate 15 MEQ (1620 MG) TBCR Take 1 tablet by mouth 2 (two) times daily. 02/26/20   Axel Filler, MD  rivaroxaban (XARELTO) 10 MG TABS tablet Take 1 tablet (10 mg total) by mouth daily at 10 pm. 02/26/20   Axel Filler, MD  Sodium Sulfate-Mag Sulfate-KCl (SUTAB) [id number] MG TABS Take 1 kit by mouth as directed. BIN: [id number] PCN: CN GROUP: [id number] MEMBER ID: [id number]; DO NOT RUN AS CASH 12/01/19   Levin Erp, PA    Allergies    Hydralazine, Lisinopril, Aspirin, and Crab [shellfish allergy]  Review of Systems   Review of Systems  Constitutional: Negative for chills and fever.  HENT: Negative for rhinorrhea and sore throat.   Eyes: Negative for visual disturbance.  Respiratory: Positive for shortness of breath. Negative for cough.   Cardiovascular: Negative for chest pain.  Gastrointestinal: Negative for abdominal pain, diarrhea, nausea and vomiting.  Genitourinary: Negative for dysuria, frequency and urgency.  Musculoskeletal: Negative for joint swelling.  Skin: Negative for rash and wound.  Neurological: Negative for syncope and numbness.    All other systems reviewed and are negative.   Physical Exam Updated Vital Signs BP 120/67    Pulse 91    Temp (!) 97.4 F (36.3 C)    Resp 22    LMP 09/23/1986    SpO2 (S) (!) 86% Comment: RA  Physical Exam Vitals and nursing note reviewed.  Constitutional:      Appearance: She is obese.  HENT:     Head: Normocephalic and atraumatic.     Mouth/Throat:     Lips: Pink.     Mouth: Mucous membranes are moist.     Tongue: No lesions.     Pharynx: Oropharynx is clear.  Eyes:     Conjunctiva/sclera: Conjunctivae normal.  Cardiovascular:     Rate and Rhythm: Normal rate and regular rhythm.     Heart sounds: Normal heart sounds. No murmur heard.   Pulmonary:     Effort: Tachypnea present.     Breath sounds: Decreased air movement present. Decreased breath sounds present. No wheezing or rales.  Abdominal:     General: Bowel sounds are normal. There is no distension.     Palpations: Abdomen is soft.     Tenderness: There is no abdominal tenderness.    Musculoskeletal:        General: No tenderness or deformity. Normal range of motion.     Cervical back: Neck supple.  Skin:    General: Skin is warm and dry.     Findings: No erythema or rash.  Neurological:     Mental Status: She is alert.     GCS: GCS eye subscore is 4. GCS verbal subscore is 5. GCS motor subscore is 6.     Motor: Motor function is intact.  Psychiatric:        Behavior: Behavior normal.     ED Results / Procedures / Treatments   Labs (all labs ordered are listed, but only abnormal results are displayed) Labs  Reviewed  SARS CORONAVIRUS 2 BY RT PCR (HOSPITAL ORDER, [place] [facility])  COMPREHENSIVE METABOLIC PANEL  CBC WITH DIFFERENTIAL/PLATELET  BRAIN NATRIURETIC PEPTIDE  BLOOD GAS, ARTERIAL  URINALYSIS, ROUTINE W REFLEX MICROSCOPIC  TROPONIN I (HIGH SENSITIVITY)    EKG None  Radiology No results found.  Procedures .Critical Care Performed by: Etter Sjogren,  PA-C Authorized by: Etter Sjogren, PA-C   Critical care provider statement:    Critical care time (minutes):  65   Critical care time was exclusive of:  Separately billable procedures and treating other patients   Critical care was necessary to treat or prevent imminent or life-threatening deterioration of the following conditions:  Cardiac failure, circulatory failure, respiratory failure and CNS failure or compromise   Critical care was time spent personally by me on the following activities:  Discussions with consultants, evaluation of patient's response to treatment, examination of patient, ordering and performing treatments and interventions, ordering and review of laboratory studies, ordering and review of radiographic studies, pulse oximetry, re-evaluation of patient's condition, obtaining history from patient or surrogate and review of old charts   (including critical care time)  Medications Ordered in ED Medications - No data to display  ED Course  I have reviewed the triage vital signs and the nursing notes.  Pertinent labs & imaging results that were available during my care of the patient were reviewed by me and considered in my medical decision making (see chart for details).  Clinical Course as of Feb 26 1917  Tue Feb 27, 2020  1550 Unfortunately patient continues to become less responsive while on BiPAP.  Spoke with patient's sister, Lelon Frohlich, who is at bedside and patient's daughter, Renold Don, on the phone.  Discussed risk and benefits alternatives to intubation at this time.  Patient's sister and daughter are agreeable with intubation at this time and gave verbal consent for intubation.  Attending, Dr. Roslynn Amble, was agreeable with this.  Medications ordered.  Nurse and RT aware.   [ZW]  2585 Patient intubated.  Repeat x-ray ordered to confirm location of tube.  Potassium low at 2.  IV potassium ordered.  Sedation medication ordered.  Called and discussed with critical care, Dr.  Heber [place] who states he was seen evaluate the patient.   [CK]  7 Spoke with Cardiology on call, Dr. Acie Fredrickson, who was agreeable with admission to critical care.  Nothing for cardiology to do at this time.   [CK]    Clinical Course User Index [CK] Rachel Moulds   MDM Rules/Calculators/A&P                           Patient presents with shortness of breath.  Initially she was able to answer some questions.  Initially hypoxic around placed on a nonrebreather. Work-up shows slightly elevated white count, stable hemoglobin.  VBG remarkable for pH of 7.147 and PCO2 of 112.  Chest x-ray with cardiomegaly and mild vascular congestion.  Patient reevaluated often and continued to decline.  She was placed on BiPAP and continued to decline.  Discussed intubation with family who is agreeable.  Patient intubated by attending, Dr. Roslynn Amble. Elevated troponin and BNP. CMP shows hypokalemia, hypocalcemia.  Patient given potassium.  Versed and fentanyl drips ordered for sedation.  Called and discussed with critical care who will admit the patient.  Discussed with cardiology who was agreeable there is nothing for them to do at this time.  CT angio of  the chest shows no evidence of PE possible bilateral infiltrates.  CT head unremarkable.  Attending agreeable with plan.     Final Clinical Impression(s) / ED Diagnoses Final diagnoses:  None    Rx / DC Orders ED Discharge Orders    None       Rachel Moulds 02/27/20 2307    Lucrezia Starch, MD 02/29/20 [phone]

## 2020-02-28 ENCOUNTER — Inpatient Hospital Stay (HOSPITAL_COMMUNITY): Payer: Medicare Other

## 2020-02-28 ENCOUNTER — Other Ambulatory Visit: Payer: Self-pay | Admitting: Student in an Organized Health Care Education/Training Program

## 2020-02-28 LAB — PHOSPHORUS
Phosphorus: 4.1 mg/dL (ref 2.5–4.6)
Phosphorus: 2.1 mg/dL — ABNORMAL LOW (ref 2.5–4.6)

## 2020-02-28 LAB — CBC
HCT: 44.4 % (ref 36.0–46.0)
Hemoglobin: 14 g/dL (ref 12.0–15.0)
MCH: 30.8 pg (ref 26.0–34.0)
MCHC: 31.5 g/dL (ref 30.0–36.0)
MCV: 97.6 fL (ref 80.0–100.0)
Platelets: 175 10*3/uL (ref 150–400)
RBC: 4.55 MIL/uL (ref 3.87–5.11)
RDW: 15.9 % — ABNORMAL HIGH (ref 11.5–15.5)
WBC: 13 10*3/uL — ABNORMAL HIGH (ref 4.0–10.5)
nRBC: 0.2 % (ref 0.0–0.2)

## 2020-02-28 LAB — BLOOD GAS, ARTERIAL
Acid-Base Excess: 6.1 mmol/L — ABNORMAL HIGH (ref 0.0–2.0)
Bicarbonate: 30.6 mmol/L — ABNORMAL HIGH (ref 20.0–28.0)
FIO2: 80
O2 Saturation: 95 %
Patient temperature: 99.1
pCO2 arterial: 45.8 mmHg (ref 32.0–48.0)
pH, Arterial: 7.441 (ref 7.350–7.450)
pO2, Arterial: 87.8 mmHg (ref 83.0–108.0)

## 2020-02-28 LAB — GLUCOSE, CAPILLARY
Glucose-Capillary: 63 mg/dL — ABNORMAL LOW (ref 70–99)
Glucose-Capillary: 72 mg/dL (ref 70–99)
Glucose-Capillary: 72 mg/dL (ref 70–99)
Glucose-Capillary: 84 mg/dL (ref 70–99)

## 2020-02-28 LAB — MAGNESIUM
Magnesium: 2.1 mg/dL (ref 1.7–2.4)
Magnesium: 2 mg/dL (ref 1.7–2.4)

## 2020-02-28 LAB — BASIC METABOLIC PANEL
Anion gap: 16 — ABNORMAL HIGH (ref 5–15)
Anion gap: 17 — ABNORMAL HIGH (ref 5–15)
BUN: 16 mg/dL (ref 8–23)
BUN: 16 mg/dL (ref 8–23)
CO2: 26 mmol/L (ref 22–32)
CO2: 28 mmol/L (ref 22–32)
Calcium: 9.5 mg/dL (ref 8.9–10.3)
Calcium: 9.6 mg/dL (ref 8.9–10.3)
Chloride: 100 mmol/L (ref 98–111)
Chloride: 99 mmol/L (ref 98–111)
Creatinine, Ser: 1.12 mg/dL — ABNORMAL HIGH (ref 0.44–1.00)
Creatinine, Ser: 1.01 mg/dL — ABNORMAL HIGH (ref 0.44–1.00)
GFR calc Af Amer: 55 mL/min — ABNORMAL LOW (ref >60)
GFR calc Af Amer: >60 mL/min (ref >60)
GFR calc non Af Amer: 47 mL/min — ABNORMAL LOW (ref >60)
GFR calc non Af Amer: 54 mL/min — ABNORMAL LOW (ref >60)
Glucose, Bld: 93 mg/dL (ref 70–99)
Glucose, Bld: 97 mg/dL (ref 70–99)
Potassium: 5.3 mmol/L — ABNORMAL HIGH (ref 3.5–5.1)
Potassium: 4.9 mmol/L (ref 3.5–5.1)
Sodium: 142 mmol/L (ref 135–145)
Sodium: 144 mmol/L (ref 135–145)

## 2020-02-28 LAB — MRSA PCR SCREENING: MRSA by PCR: NEGATIVE

## 2020-02-28 MED ORDER — FREE WATER
100.0000 mL | Status: DC
  Administered 2020-02-28 – 2020-02-29 (×5): 100 mL

## 2020-02-28 MED ORDER — CHLORHEXIDINE GLUCONATE CLOTH 2 % EX PADS
6.0000 | MEDICATED_PAD | Freq: Every day | CUTANEOUS | Status: DC
  Administered 2020-02-28 – 2020-03-07 (×11): 6 via TOPICAL

## 2020-02-28 MED ORDER — PROSOURCE TF PO LIQD
90.0000 mL | Freq: Two times a day (BID) | ORAL | Status: DC
  Administered 2020-02-28 – 2020-02-29 (×2): 90 mL
  Filled 2020-02-28 (×2): qty 90

## 2020-02-28 MED ORDER — CHLORHEXIDINE GLUCONATE 0.12% ORAL RINSE (MEDLINE KIT)
15.0000 mL | Freq: Two times a day (BID) | OROMUCOSAL | Status: DC
  Administered 2020-02-28 – 2020-02-29 (×3): 15 mL via OROMUCOSAL

## 2020-02-28 MED ORDER — DOCUSATE SODIUM 50 MG/5ML PO LIQD
100.0000 mg | Freq: Two times a day (BID) | ORAL | Status: DC
  Administered 2020-02-28 – 2020-02-29 (×2): 100 mg
  Filled 2020-02-28 (×2): qty 10

## 2020-02-28 MED ORDER — POLYETHYLENE GLYCOL 3350 17 G PO PACK
17.0000 g | PACK | Freq: Every day | ORAL | Status: DC
  Administered 2020-02-28 – 2020-02-29 (×2): 17 g

## 2020-02-28 MED ORDER — DEXTROSE 50 % IV SOLN
INTRAVENOUS | Status: AC
  Filled 2020-02-28: qty 50

## 2020-02-28 MED ORDER — ORAL CARE MOUTH RINSE: 15.0000 mL | OROMUCOSAL | Status: DC

## 2020-02-28 MED ORDER — ADULT MULTIVITAMIN W/MINERALS CH: 1.0000 | ORAL_TABLET | Freq: Every day | ORAL | Status: DC

## 2020-02-28 MED ORDER — DEXTROSE 50 % IV SOLN
12.5000 g | Freq: Once | INTRAVENOUS | Status: AC
  Administered 2020-02-28: 12.5 g via INTRAVENOUS

## 2020-02-28 MED ORDER — POTASSIUM & SODIUM PHOSPHATES 280-160-250 MG PO PACK
1.0000 | PACK | Freq: Three times a day (TID) | ORAL | Status: DC
  Administered 2020-02-28: 1 via ORAL
  Filled 2020-02-28 (×2): qty 1

## 2020-02-28 MED ORDER — PROSOURCE TF PO LIQD
45.0000 mL | Freq: Two times a day (BID) | ORAL | Status: DC
  Filled 2020-02-28: qty 45

## 2020-02-28 MED ORDER — POLYETHYLENE GLYCOL 3350 17 G PO PACK: 17.0000 g | PACK | Freq: Every day | ORAL | Status: DC | PRN

## 2020-02-28 MED ORDER — FUROSEMIDE 10 MG/ML IJ SOLN
40.0000 mg | Freq: Once | INTRAMUSCULAR | Status: AC
  Administered 2020-02-28: 40 mg via INTRAVENOUS
  Filled 2020-02-28: qty 4

## 2020-02-28 MED ORDER — ENOXAPARIN SODIUM 150 MG/ML ~~LOC~~ SOLN
140.0000 mg | Freq: Two times a day (BID) | SUBCUTANEOUS | Status: AC
  Administered 2020-02-28 – 2020-02-29 (×2): 140 mg via SUBCUTANEOUS
  Filled 2020-02-28 (×2): qty 0.94

## 2020-02-28 MED ORDER — VITAL HIGH PROTEIN PO LIQD
1000.0000 mL | ORAL | Status: DC
  Administered 2020-02-28: 1000 mL

## 2020-02-28 MED ORDER — RIVAROXABAN 10 MG PO TABS
10.0000 mg | ORAL_TABLET | Freq: Every day | ORAL | Status: DC
  Administered 2020-02-29 – 2020-03-05 (×6): 10 mg via ORAL
  Filled 2020-02-28 (×7): qty 1

## 2020-02-28 MED ORDER — CHLORHEXIDINE GLUCONATE CLOTH 2 % EX PADS: 6.0000 | MEDICATED_PAD | Freq: Every day | CUTANEOUS | Status: DC

## 2020-02-28 MED ORDER — CHLORHEXIDINE GLUCONATE 0.12% ORAL RINSE (MEDLINE KIT): 15.0000 mL | Freq: Two times a day (BID) | OROMUCOSAL | Status: DC

## 2020-02-28 MED ORDER — ORAL CARE MOUTH RINSE
15.0000 mL | OROMUCOSAL | Status: DC
  Administered 2020-02-28 – 2020-02-29 (×14): 15 mL via OROMUCOSAL

## 2020-02-28 MED ORDER — POTASSIUM & SODIUM PHOSPHATES 280-160-250 MG PO PACK
1.0000 | PACK | Freq: Three times a day (TID) | ORAL | Status: DC
  Administered 2020-02-28 – 2020-02-29 (×2): 1
  Filled 2020-02-28 (×4): qty 1

## 2020-02-28 MED ORDER — DOCUSATE SODIUM 50 MG/5ML PO LIQD
100.0000 mg | Freq: Two times a day (BID) | ORAL | Status: DC | PRN
  Administered 2020-02-28: 100 mg
  Filled 2020-02-28: qty 10

## 2020-02-28 MED ORDER — ADULT MULTIVITAMIN W/MINERALS CH
1.0000 | ORAL_TABLET | Freq: Every day | ORAL | Status: DC
  Administered 2020-02-29 – 2020-03-07 (×7): 1
  Filled 2020-02-28 (×8): qty 1

## 2020-02-28 NOTE — Progress Notes (Signed)
ABG obtained on 380, 24, peep 8 and 80%  Results for Tina Chase, Tina Chase (MRN [id number]) as of 02/28/2020 04:30  Ref. Range 02/28/2020 03:55  FIO2 Unknown 80.00  pH, Arterial Latest Ref Range: 7.35 - 7.45  7.441  pCO2 arterial Latest Ref Range: 32 - 48 mmHg 45.8  pO2, Arterial Latest Ref Range: 83 - 108 mmHg 87.8  Acid-Base Excess Latest Ref Range: 0.0 - 2.0 mmol/L 6.1 (H)  Bicarbonate Latest Ref Range: 20.0 - 28.0 mmol/L 30.6 (H)  O2 Saturation Latest Units: % 95.0  Patient temperature Unknown 99.1  Allens test (pass/fail) Latest Ref Range: PASS  PASS

## 2020-02-28 NOTE — Progress Notes (Signed)
PHARMACY NOTE -  Unasyn  Pharmacy has been assisting with dosing of Unasyn for aspiration PNA.  Dosage remains stable at 3g IV q6 hr and pharmacy may renally adjust for ALL abx without consult per [facility] policy  Pharmacy will sign off, following peripherally for culture results or dose adjustments. Please reconsult if a change in clinical status warrants re-evaluation of dosage.  Bernadene Person, PharmD, BCPS [phone] 02/28/2020, 11:19 AM

## 2020-02-28 NOTE — Progress Notes (Signed)
Initial Nutrition Assessment  DOCUMENTATION CODES:   Morbid obesity  INTERVENTION:  - if patient unable to be extubated, recommend Vital High Protein @ 40 ml/hr with 90 ml Prosource TF BID, 1 tablet multivitamin with minerals/day, and 100 ml free water every 4 hours. - this regimen will provide 1120 kcal, 128 grams protein, and 1402 ml free water.   NUTRITION DIAGNOSIS:   Inadequate oral intake related to inability to eat as evidenced by NPO status.  GOAL:   Provide needs based on ASPEN/SCCM guidelines  MONITOR:   Vent status, Labs, Weight trends  REASON FOR ASSESSMENT:   Ventilator  ASSESSMENT:   79 year old female with medical hx of PE on Xarelto, HTN, hyperlipidemia, obesity, and sleep apnea. She underwent colonoscopy on 7/26 and since that time was feeling weak and was noted to be confused at home. She was intubated in the ED d/t AMS and hypoxic, hypercarbic respiratory failure.  Patient was intubated in the ED. She remains intubated at this time and OGT in place to LIS. Daughter is at bedside and provides information. Patient has little to no nutrition since 7/21 in preparation for colonoscopy. Prior to that, no changes in appetite. She did not report to family any feelings of cramping or nausea after colonoscopy. Daughter had prepared her some light foods after procedure but patient had declined.   Weight today is 303 lb. This is up from 7/26 when she weighed 296 lb. IBW used in estimating nutrition needs.   Able to talk with Dr. Kendrick Fries briefly about patient.    Patient is currently intubated on ventilator support MV: 8.9 L/min Temp (24hrs), Avg:99 F (37.2 C), Min:97.4 F (36.3 C), Max:99.4 F (37.4 C) Propofol: none  Labs reviewed; creatinine: 1.01 mg/dl. Medications reviewed; 100 mg colace BID, 40 mg IV protonix/day, 17 g miralax/day.  IVF; LR @ 100 ml/hr. Drips; versed @ 0.5 mg/hr, heparin @ 1500 units/hr, fentanyl @ 50 mcg/hr.    NUTRITION - FOCUSED  PHYSICAL EXAM:  completed; no muscle or fat wasting, mild edema to BLE  Diet Order:   Diet Order            Diet NPO time specified  Diet effective now                 EDUCATION NEEDS:   No education needs have been identified at this time  Skin:  Skin Assessment: Reviewed RN Assessment  Last BM:  7/28 (type 5)  Height:   Ht Readings from Last 1 Encounters:  02/27/20 5\' 1"  (1.549 m)    Weight:   Wt Readings from Last 1 Encounters:  02/28/20 (!) 137.6 kg    Estimated Nutritional Needs:  Kcal:  1050-1193 kcal Protein:  >/= 119 grams Fluid:  >/= 1.5 L/day     03/01/20, MS, RD, LDN, CNSC Inpatient Clinical Dietitian RD pager # available in [facility]ION  After hours/weekend pager # available in [facility]

## 2020-02-28 NOTE — Progress Notes (Addendum)
1600 CBG was 63. (Tube feeds had just been started) Hypoglycemia standing orders implemented by this RN; 12.5 G D50 given IVP per protocol. This RN will continue to monitor CBG.

## 2020-02-28 NOTE — Progress Notes (Signed)
NUTRITION NOTE  Consult received for TF initiation and advancement. Will order TF as outlined in full assessment note from earlier today: Vital High Protein @ 40 ml/hr with 90 ml Prosource TF BID, 1 tablet multivitamin with minerals/day, and 100 ml free water every 4 hours.  Will continue to follow per protocol.     Trenton Gammon, MS, RD, LDN, CNSC Inpatient Clinical Dietitian RD pager # available in [facility]  After hours/weekend pager # available in [employer]

## 2020-02-28 NOTE — Progress Notes (Signed)
Pt transported from RESB to room 1233 in the ICU.  Pt remained stable and comfortable throughout the trip.

## 2020-02-28 NOTE — Progress Notes (Signed)
NAME:  Tina Chase, MRN:  [id number], DOB:  Sep 21, 1940, LOS: 1 ADMISSION DATE:  02/27/2020, CONSULTATION DATE: 02/27/2020 REFERRING MD: EDP, CHIEF COMPLAINT: Hypoxic, hypercarbic respiratory failure  Brief History   79 year old with history of PE on Xarelto, hypertension, hyperlipidemia, obesity, sleep apnea Admitted with hypoxic, hypercarbic respiratory failure Underwent colonoscopy yesterday and had been feeling weak with desats.  Today noted to be confused at home Intubated in the ED with altered mental status.   Past Medical History    has a past medical history of Arthritis, Asthma, Chronic kidney disease, Diverticulosis, Hyperlipidemia, Hyperparathyroidism, Hypertension, IDA (iron deficiency anemia), Kidney atrophy, Myocardial infarction (HCC), Obesity, Pica in adults, Pulmonary embolism (HCC), Pulmonary nodule, Sleep apnea, and Small bowel obstruction (HCC).  Significant Hospital Events   7/27 Admit 7/28 Remains on vent  Consults:  PCCM  Procedures:  ETT 7/27 >>   Significant Diagnostic Tests:  CT Head 7/27 >> no acute intracranial abnormality  CTA Chest 7/27 >> no evidence of PE, bilateral lower lobe consolidation consistent with multifocal infiltrates, no associated effusions  Micro Data:  COVID 7/27 >> negative  BCx2 7/27 >>  UC 7/27 >>  MRSA PCR 7/27 >> negative   Antimicrobials:  Unasyn 7/27 >>  Interim history/subjective:  Afebrile, tmax 99.3 / WBC 13 On vent, 40% / PEEP 8  Glucose range 66-97 I/O 900 ml UOP, +1.1L since admit  Objective   Blood pressure 127/74, pulse 60, temperature 98.6 F (37 C), temperature source Core, resp. rate (!) 24, height 5\' 1"  (1.549 m), weight (!) 137.6 kg, last menstrual period 09/23/1986, SpO2 95 %.    Vent Mode: PRVC FiO2 (%):  [30 %-80 %] 30 % Set Rate:  [16 bmp-24 bmp] 24 bmp Vt Set:  [380 mL] 380 mL PEEP:  [8 cmH20] 8 cmH20 Plateau Pressure:  [22 cmH20-27 cmH20] 23 cmH20   Intake/Output Summary (Last 24 hours)  at 02/28/2020 1430 Last data filed at 02/28/2020 1400 Gross per 24 hour  Intake 3552.82 ml  Output 1000 ml  Net 2552.82 ml   Filed Weights   02/28/20 0207  Weight: (!) 137.6 kg    Examination: General: adult female lying in bed on vent in NAD   HEENT: MM pink/moist, ETT, anicteric  Neuro: Awakens to voice, nods appropriately, indicates pain in left knee  CV: s1s2 rrr, no m/r/g PULM: non-labored on vent, lungs bilaterally clear anterior, diminished bases  GI: soft, bsx4 active  Extremities: warm/dry, BLE pitting edema  Skin: no rashes or lesions  Resolved Hospital Problem list     Assessment & Plan:   Acute hypoxic, hypercarbic respiratory failure Bilateral Lower Lobe Consolidation, Possible Aspiration PNA Baseline OSA Enlarged Pulmonary Trunk on CT / Suspected Pulmonary Hypertension  Unclear etiology. May have aspirated during procedure or not used her CPAP at night due to weakness post colonoscopy yesterday. Note baseline CO2 on BMP ~30.   -PRVC 8cc/kg  -wean PEEP / fiO2 for sats >90% -continue empiric abx as above -follow cultures  -intermittent CXR  -daily SBT / WUA, plan for SBT with goal for extubation 7/29 if able to wean  -will need QHS CPAP post extubation   History of pulmonary embolism x2, on Eliquis No PE noted on admit 7/27 CTA imaging.  Life long anticoagulation given recurrent PE.  -transition back to Eliquis 7/28  -difficult stick, unable to monitor heparin gtt levels  -follow CBC   Hypertension, hyperlipidemia -hold home medications -monitor BP   AKI on CKD  -Trend BMP /  urinary output -Replace electrolytes as indicated -Avoid nephrotoxic agents, ensure adequate renal perfusion  At Risk Malnutrition  -begin TF per Nutrition   Best practice:  Diet: NPO Pain/Anxiety/Delirium protocol (if indicated): Fentanyl, versed gtt VAP protocol (if indicated): Ordered DVT prophylaxis: Heparin gtt > to home xarelto  GI prophylaxis: PPI Glucose control:  Monitor Mobility: Bed Code Status: Full Family Communication: Will call for update on plan of care  Disposition: ICU  Labs   CBC: Recent Labs  Lab 02/27/20 1352 02/28/20 0235  WBC 12.7* 13.0*  NEUTROABS 9.2*  --   HGB 14.9 14.0  HCT 48.9* 44.4  MCV 102.9* 97.6  PLT 238 175    Basic Metabolic Panel: Recent Labs  Lab 02/27/20 1455 02/28/20 0235 02/28/20 0401  NA 146* 142 144  K 2.0* 5.3* 4.9  CL 123* 100 99  CO2 19* 26 28  GLUCOSE 66* 93 97  BUN 9 16 16   CREATININE 0.45 1.12* 1.01*  CALCIUM 4.2* 9.5 9.6  MG  --  2.1  --   PHOS  --  4.1  --    GFR: Estimated Creatinine Clearance: 61.6 mL/min (A) (by C-G formula based on SCr of 1.01 mg/dL (H)). Recent Labs  Lab 02/27/20 1352 02/28/20 0235  WBC 12.7* 13.0*    Liver Function Tests: Recent Labs  Lab 02/27/20 1455  AST 28  ALT 15  ALKPHOS 42  BILITOT <0.1*  PROT 3.2*  ALBUMIN 1.5*   No results for input(s): LIPASE, AMYLASE in the last 168 hours. No results for input(s): AMMONIA in the last 168 hours.  ABG    Component Value Date/Time   PHART 7.441 02/28/2020 0355   PCO2ART 45.8 02/28/2020 0355   PO2ART 87.8 02/28/2020 0355   HCO3 30.6 (H) 02/28/2020 0355   TCO2 28 11/06/2015 1315   O2SAT 95.0 02/28/2020 0355     Coagulation Profile: No results for input(s): INR, PROTIME in the last 168 hours.  Cardiac Enzymes: No results for input(s): CKTOTAL, CKMB, CKMBINDEX, TROPONINI in the last 168 hours.  HbA1C: Hemoglobin A1C  Date/Time Value Ref Range Status  04/10/2014 09:32 AM 5.5  Final  09/23/2010 12:00 AM 5.6  Final   Hgb A1c MFr Bld  Date/Time Value Ref Range Status  07/06/2016 09:08 AM 5.4 4.8 - 5.6 % Final    Comment:             Pre-diabetes: 5.7 - 6.4          Diabetes: >6.4          Glycemic control for adults with diabetes: <7.0     CBG: No results for input(s): GLUCAP in the last 168 hours.    Critical care time: 33 minutes    14/11/2015, MSN, NP-C [facility] 02/28/2020, 2:30 PM   Please see [email or web address] for pager details.

## 2020-02-28 NOTE — Progress Notes (Signed)
eLink Physician-Brief Progress Note Patient Name: Tina Chase DOB: 05-Jun-1941 MRN: [id number]   Date of Service  02/28/2020  HPI/Events of Note  10 F morbidly obese (BMI57), OSA on CPAP, PE on xarelto, CKD, HTN dyslipidemia. She  underwent EGD/colonoscopy th day prior for iron def anemia with findings of diverticulosis and erosive gastropathy without bleed. She ws brought back in due to altered sensorium and is now intubated. Head CT negative. Chest CT without PE but with multifocal pneumonia.  eICU Interventions  Likely OSA triggered by sedation and possible aspiration. Sedation holiday and SBT in am     Intervention Category Major Interventions: Respiratory failure - evaluation and management;Change in mental status - evaluation and management;Infection - evaluation and management Evaluation Type: New Patient Evaluation  Darl Pikes 02/28/2020, 2:43 AM

## 2020-02-28 NOTE — TOC Initial Note (Signed)
Transition of Care [facility]) - Initial/Assessment Note    Patient Details  Name: Tina Chase MRN: [id number] Date of Birth: Jul 10, 1941  [facility] CM/SW Contact:    Golda Acre, RN Phone Number: 02/28/2020, 8:54 AM  Clinical Narrative:                 s- intubated in the ed due to resp failure and ams. Lives at home alone, wbc-13.0 p-follow for toc needs and progression.  Expected Discharge Plan: Home/Self Care Barriers to Discharge: Continued Medical Work up   Patient Goals and CMS Choice Patient states their goals for this hospitalization and ongoing recovery are:: unable to state due to vent      Expected Discharge Plan and Services Expected Discharge Plan: Home/Self Care   Discharge Planning Services: CM Consult   Living arrangements for the past 2 months: Apartment                                      Prior Living Arrangements/Services Living arrangements for the past 2 months: Apartment Lives with:: Self Patient language and need for interpreter reviewed:: Yes        Need for Family Participation in Patient Care: Yes (Comment) Care giver support system in place?: Yes (comment) (daughter)   Criminal Activity/Legal Involvement Pertinent to Current Situation/Hospitalization: No - Comment as needed  Activities of Daily Living Home Assistive Devices/Equipment: Eyeglasses ADL Screening (condition at time of admission) Patient's cognitive ability adequate to safely complete daily activities?: Yes (unresponsive currently but normally is ok) Is the patient deaf or have difficulty hearing?: No Does the patient have difficulty seeing, even when wearing glasses/contacts?: No Does the patient have difficulty concentrating, remembering, or making decisions?: No (unresponsive currently) Patient able to express need for assistance with ADLs?: No Does the patient have difficulty dressing or bathing?: Yes Independently performs ADLs?: No (normally  completely independent but unresponsive now) Communication: Dependent Is this a change from baseline?: Change from baseline, expected to last >3 days Dressing (OT): Dependent Is this a change from baseline?: Change from baseline, expected to last >3 days Grooming: Dependent Is this a change from baseline?: Change from baseline, expected to last >3 days Feeding: Dependent Is this a change from baseline?: Change from baseline, expected to last >3 days Bathing: Dependent Is this a change from baseline?: Change from baseline, expected to last >3 days Toileting: Dependent Is this a change from baseline?: Change from baseline, expected to last >3days In/Out Bed: Dependent Is this a change from baseline?: Change from baseline, expected to last >3 days Walks in Home: Dependent Is this a change from baseline?: Change from baseline, expected to last >3 days Does the patient have difficulty walking or climbing stairs?: Yes Weakness of Legs: Both Weakness of Arms/Hands: Both  Permission Sought/Granted                  Emotional Assessment Appearance:: Appears stated age     Orientation: : Fluctuating Orientation (Suspected and/or reported Sundowners) Alcohol / Substance Use: Tobacco Use Psych Involvement: No (comment)  Admission diagnosis:  Acute respiratory failure (HCC) [J96.00] Hypokalemia [E87.6] Elevated troponin [R77.8] Elevated brain natriuretic peptide (BNP) level [R79.89] Acute respiratory failure with hypercapnia (HCC) [J96.02] Altered mental status, unspecified altered mental status type [R41.82] Patient Active Problem List   Diagnosis Date Noted  . Acute respiratory failure (HCC) 02/27/2020  . Iron deficiency anemia   .  Erosive gastritis   . Abdominal hernia 12/11/2019  . Iron deficiency 11/07/2019  . Hypercoagulable state (HCC)   . Sleep apnea 07/08/2015  . Bilateral lower extremity edema 01/09/2014  . 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 Nephrolithiasis 05/25/2013  .  Hypertension 12/22/2011  . Obesity, Class III, BMI 40-49.9 (morbid obesity) (HCC) 12/22/2011  . Asthma 12/04/2011  . Single kidney 09/15/2011  . Routine health maintenance 09/15/2011   PCP:  Tyson Alias, MD Pharmacy:   [facility] [place], [place] - [address] [place] [address] Phone: [phone] Fax: [phone]     Social Determinants of Health (SDOH) Interventions    Readmission Risk Interventions No flowsheet data found.

## 2020-02-29 ENCOUNTER — Inpatient Hospital Stay: Payer: Self-pay

## 2020-02-29 ENCOUNTER — Inpatient Hospital Stay (HOSPITAL_COMMUNITY): Payer: Medicare Other

## 2020-02-29 DIAGNOSIS — J9601 Acute respiratory failure with hypoxia: Secondary | ICD-10-CM | POA: Diagnosis not present

## 2020-02-29 LAB — BASIC METABOLIC PANEL
Anion gap: 9 (ref 5–15)
BUN: 18 mg/dL (ref 8–23)
CO2: 32 mmol/L (ref 22–32)
Calcium: 9 mg/dL (ref 8.9–10.3)
Chloride: 100 mmol/L (ref 98–111)
Creatinine, Ser: 0.83 mg/dL (ref 0.44–1.00)
GFR calc Af Amer: >60 mL/min (ref >60)
GFR calc non Af Amer: >60 mL/min (ref >60)
Glucose, Bld: 91 mg/dL (ref 70–99)
Potassium: 3.7 mmol/L (ref 3.5–5.1)
Sodium: 141 mmol/L (ref 135–145)

## 2020-02-29 LAB — CBC
HCT: 38.8 % (ref 36.0–46.0)
Hemoglobin: 13.1 g/dL (ref 12.0–15.0)
MCH: 32 pg (ref 26.0–34.0)
MCHC: 33.8 g/dL (ref 30.0–36.0)
MCV: 94.9 fL (ref 80.0–100.0)
Platelets: 141 10*3/uL — ABNORMAL LOW (ref 150–400)
RBC: 4.09 MIL/uL (ref 3.87–5.11)
RDW: 16 % — ABNORMAL HIGH (ref 11.5–15.5)
WBC: 14.3 10*3/uL — ABNORMAL HIGH (ref 4.0–10.5)
nRBC: 0.1 % (ref 0.0–0.2)

## 2020-02-29 LAB — GLUCOSE, CAPILLARY
Glucose-Capillary: 69 mg/dL — ABNORMAL LOW (ref 70–99)
Glucose-Capillary: 93 mg/dL (ref 70–99)
Glucose-Capillary: 99 mg/dL (ref 70–99)
Glucose-Capillary: 82 mg/dL (ref 70–99)
Glucose-Capillary: 103 mg/dL — ABNORMAL HIGH (ref 70–99)
Glucose-Capillary: 108 mg/dL — ABNORMAL HIGH (ref 70–99)
Glucose-Capillary: 73 mg/dL (ref 70–99)

## 2020-02-29 LAB — PHOSPHORUS: Phosphorus: 2.2 mg/dL — ABNORMAL LOW (ref 2.5–4.6)

## 2020-02-29 LAB — URINE CULTURE

## 2020-02-29 LAB — MAGNESIUM: Magnesium: 1.8 mg/dL (ref 1.7–2.4)

## 2020-02-29 MED ORDER — DEXTROSE 50 % IV SOLN
12.5000 g | INTRAVENOUS | Status: AC
  Administered 2020-02-29: 12.5 g via INTRAVENOUS
  Filled 2020-02-29: qty 50

## 2020-02-29 MED ORDER — POTASSIUM PHOSPHATES 15 MMOLE/5ML IV SOLN
10.0000 mmol | Freq: Once | INTRAVENOUS | Status: AC
  Administered 2020-02-29: 10 mmol via INTRAVENOUS
  Filled 2020-02-29: qty 3.33

## 2020-02-29 MED ORDER — ORAL CARE MOUTH RINSE
15.0000 mL | Freq: Two times a day (BID) | OROMUCOSAL | Status: DC
  Administered 2020-02-29: 15 mL via OROMUCOSAL

## 2020-02-29 MED ORDER — ALBUTEROL SULFATE (2.5 MG/3ML) 0.083% IN NEBU
2.5000 mg | INHALATION_SOLUTION | RESPIRATORY_TRACT | Status: DC | PRN
  Administered 2020-02-29: 2.5 mg via RESPIRATORY_TRACT
  Filled 2020-02-29 (×2): qty 3

## 2020-02-29 MED ORDER — DEXTROSE 50 % IV SOLN
12.5000 g | Freq: Once | INTRAVENOUS | Status: AC
  Administered 2020-03-01: 12.5 g via INTRAVENOUS
  Filled 2020-02-29: qty 50

## 2020-02-29 MED ORDER — LACTATED RINGERS IV BOLUS
500.0000 mL | Freq: Once | INTRAVENOUS | Status: AC
  Administered 2020-02-29: 500 mL via INTRAVENOUS

## 2020-02-29 MED ORDER — FUROSEMIDE 10 MG/ML IJ SOLN: 40.0000 mg | Freq: Once | INTRAMUSCULAR | Status: DC

## 2020-02-29 MED ORDER — CHLORHEXIDINE GLUCONATE 0.12 % MT SOLN
15.0000 mL | Freq: Two times a day (BID) | OROMUCOSAL | Status: DC
  Administered 2020-02-29 – 2020-03-07 (×14): 15 mL via OROMUCOSAL
  Filled 2020-02-29 (×12): qty 15

## 2020-02-29 MED ORDER — ORAL CARE MOUTH RINSE
15.0000 mL | Freq: Two times a day (BID) | OROMUCOSAL | Status: DC
  Administered 2020-03-01 – 2020-03-07 (×12): 15 mL via OROMUCOSAL

## 2020-02-29 NOTE — Progress Notes (Signed)
NAME:  Tina Chase, MRN:  [id number], DOB:  1940-10-27, LOS: 2 ADMISSION DATE:  02/27/2020, CONSULTATION DATE: 02/27/2020 REFERRING MD: EDP, CHIEF COMPLAINT: Hypoxic, hypercarbic respiratory failure  Brief History   79 year old with history of PE on Xarelto, hypertension, hyperlipidemia, obesity, sleep apnea Admitted with hypoxic, hypercarbic respiratory failure Underwent colonoscopy 7/26 and had been feeling weak with desats. Noted to be confused at home 7/27 Intubated in the ED with altered mental status.   Past Medical History    has a past medical history of Arthritis, Asthma, Chronic kidney disease, Diverticulosis, Hyperlipidemia, Hyperparathyroidism, Hypertension, IDA (iron deficiency anemia), Kidney atrophy, Myocardial infarction (HCC), Obesity, Pica in adults, Pulmonary embolism (HCC), Pulmonary nodule, Sleep apnea, and Small bowel obstruction (HCC).  Significant Hospital Events   7/27 Admit 7/28 Remains on vent  Consults:  PCCM  Procedures:  ETT 7/27 >>   Significant Diagnostic Tests:  CT Head 7/27 >> no acute intracranial abnormality  CTA Chest 7/27 >> no evidence of PE, bilateral lower lobe consolidation consistent with multifocal infiltrates, no associated effusions  Micro Data:  COVID 7/27 >> negative  BCx2 7/27 >>  UC 7/27 >> multiple species, suggest recollect MRSA PCR 7/27 >> negative   Antimicrobials:  Unasyn 7/27 >>  Interim history/subjective:  Afebrile / WBC 14.3  Glucose range 90's, episode of hypoglycemia into 60's I/O 1.8L UOP, +1.6L in last 24 hours RN reports pt weaning on PSV, remains on low dose versed / fentanyl gtts Received 500 ml bolus overnight after lasix  Objective   Blood pressure 106/79, pulse 76, temperature 98.6 F (37 C), temperature source Axillary, resp. rate (!) 24, height 5\' 1"  (1.549 m), weight (!) 144.5 kg, last menstrual period 09/23/1986, SpO2 100 %.    Vent Mode: PSV;CPAP FiO2 (%):  [30 %-40 %] 30 % Set Rate:  [24  bmp] 24 bmp Vt Set:  [380 mL] 380 mL PEEP:  [8 cmH20] 8 cmH20 Plateau Pressure:  [21 cmH20-28 cmH20] 24 cmH20   Intake/Output Summary (Last 24 hours) at 02/29/2020 1034 Last data filed at 02/29/2020 0800 Gross per 24 hour  Intake 4428.48 ml  Output 1850 ml  Net 2578.48 ml   Filed Weights   02/28/20 0207 02/29/20 0500  Weight: (!) 137.6 kg (!) 144.5 kg    Examination: General: elderly adult female lying in bed in NAD on vent  HEENT: MM pink/moist, ETT, anicteric Neuro: awakens to voice, nods / interacts appropriately CV: s1s2 rrr, no m/r/g PULM: non-labored on PSV, lungs bilaterally distant but clear  GI: soft, bsx4 active  Extremities: warm/dry, 1+ BLE  edema  Skin: no rashes or lesions  Resolved Hospital Problem list     Assessment & Plan:   Acute hypoxic, hypercarbic respiratory failure Bilateral Lower Lobe Consolidation, Possible Aspiration PNA Baseline OSA Enlarged Pulmonary Trunk on CT / Suspected Pulmonary Hypertension  Unclear etiology. May have aspirated during procedure or not used her CPAP at night due to weakness post colonoscopy yesterday. Note baseline CO2 on BMP ~30.   -SBT with goal for extubation  -wean PEEP / FiO2 for sats >90% -follow cultures  -intermittent CXR  -will need mandatory CPAP post extubation QHS and PRN daytime sleep   History of pulmonary embolism x2, on Eliquis No PE noted on admit 7/27 CTA imaging.  Life long anticoagulation given recurrent PE.  -continue eliquis  -place PICC with difficult IV stick  -follow CBC  Hypertension, hyperlipidemia -hold home antihypertensives  -follow BP trend    AKI on CKD  -  Trend BMP / urinary output -Replace electrolytes as indicated -Avoid nephrotoxic agents, ensure adequate renal perfusion  At Risk Malnutrition  -hold TF for extubation   Best practice:  Diet: NPO  Pain/Anxiety/Delirium protocol (if indicated): Fentanyl, versed gtt > on hold for potential extubation  VAP protocol (if  indicated): Ordered DVT prophylaxis:  xarelto  GI prophylaxis: PPI Glucose control: Monitor Mobility: Bed Code Status: Full Family Communication: Family (sister and daughter) updated at bedside 7/29  Disposition: ICU  Labs   CBC: Recent Labs  Lab 02/27/20 1352 02/28/20 0235 02/29/20 0524  WBC 12.7* 13.0* 14.3*  NEUTROABS 9.2*  --   --   HGB 14.9 14.0 13.1  HCT 48.9* 44.4 38.8  MCV 102.9* 97.6 94.9  PLT 238 175 141*    Basic Metabolic Panel: Recent Labs  Lab 02/27/20 1455 02/28/20 0235 02/28/20 0401 02/28/20 1636 02/29/20 0524  NA 146* 142 144  --  141  K 2.0* 5.3* 4.9  --  3.7  CL 123* 100 99  --  100  CO2 19* 26 28  --  32  GLUCOSE 66* 93 97  --  91  BUN 9 16 16   --  18  CREATININE 0.45 1.12* 1.01*  --  0.83  CALCIUM 4.2* 9.5 9.6  --  9.0  MG  --  2.1  --  2.0 1.8  PHOS  --  4.1  --  2.1* 2.2*   GFR: Estimated Creatinine Clearance: 77.5 mL/min (by C-G formula based on SCr of 0.83 mg/dL). Recent Labs  Lab 02/27/20 1352 02/28/20 0235 02/29/20 0524  WBC 12.7* 13.0* 14.3*    Liver Function Tests: Recent Labs  Lab 02/27/20 1455  AST 28  ALT 15  ALKPHOS 42  BILITOT <0.1*  PROT 3.2*  ALBUMIN 1.5*   No results for input(s): LIPASE, AMYLASE in the last 168 hours. No results for input(s): AMMONIA in the last 168 hours.  ABG    Component Value Date/Time   PHART 7.441 02/28/2020 0355   PCO2ART 45.8 02/28/2020 0355   PO2ART 87.8 02/28/2020 0355   HCO3 30.6 (H) 02/28/2020 0355   TCO2 28 11/06/2015 1315   O2SAT 95.0 02/28/2020 0355     Coagulation Profile: No results for input(s): INR, PROTIME in the last 168 hours.  Cardiac Enzymes: No results for input(s): CKTOTAL, CKMB, CKMBINDEX, TROPONINI in the last 168 hours.  HbA1C: Hemoglobin A1C  Date/Time Value Ref Range Status  04/10/2014 09:32 AM 5.5  Final  09/23/2010 12:00 AM 5.6  Final   Hgb A1c MFr Bld  Date/Time Value Ref Range Status  07/06/2016 09:08 AM 5.4 4.8 - 5.6 % Final     Comment:             Pre-diabetes: 5.7 - 6.4          Diabetes: >6.4          Glycemic control for adults with diabetes: <7.0     CBG: Recent Labs  Lab 02/28/20 1916 02/28/20 2313 02/29/20 0312 02/29/20 0350 02/29/20 0757  GLUCAP 72 84 69* 93 99      Critical care time: 34 minutes    03/02/20, MSN, NP-C [facility] 02/29/2020, 10:34 AM   Please see [email or web address] for pager details.

## 2020-02-29 NOTE — Progress Notes (Signed)
eLink Physician-Brief Progress Note Patient Name: Tina Chase DOB: 09-25-1940 MRN: [id number]   Date of Service  02/29/2020  HPI/Events of Note  Camera: MAP 55. SBP hovers around 90's. fio2 on Vent at 30%. Obese. Received lasix yesterday once. On LR 100 ml/hr. Cr , Hg normal. No fever. On AC. Got D50 once overnight. Hx of PE. AHHRF. Obese. EF normal.   eICU Interventions  LR 500 ml bolus once, to call back if not better. Does not have a central line.      Intervention Category Intermediate Interventions: Hypotension - evaluation and management  Ranee Gosselin 02/29/2020, 4:34 AM

## 2020-02-29 NOTE — Progress Notes (Signed)
Follow up for PICC insertion. Primary RN checked with patient and she is still reluctant to PICC placement. IV Team to follow up in AM of 03-01-20.

## 2020-02-29 NOTE — Progress Notes (Signed)
Patient has been experiencing some wheezing and tachypnea within the last hour and a half. CCM notified by this RN, and PRN albuterol nebs were ordered. After treatment, pt remained tachypneic and SOB with wheezing. Per CCM, place pt on CPAP. RT made aware by this RN.  Pt also appeared to switch into an irregular heart rhythm tymporarily on the monitor. EKG showed ST. This RN will continue to carefully monitor pt.

## 2020-02-29 NOTE — Evaluation (Signed)
Clinical/Bedside Swallow Evaluation Patient Details  Name: Tina Chase MRN: [id number] Date of Birth: May 14, 1941  Today's Date: 02/29/2020 Time: SLP Start Time (ACUTE ONLY): 1620 SLP Stop Time (ACUTE ONLY): 1635 SLP Time Calculation (min) (ACUTE ONLY): 15 min  Past Medical History:  Past Medical History:  Diagnosis Date  . Arthritis    left knee  and left shoulder   . Asthma   . 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 Diverticulosis   . Hyperlipidemia   . Hyperparathyroidism   . Hypertension   . IDA (iron deficiency anemia)   . Kidney atrophy   . Myocardial infarction (HCC)   . Obesity   . Pica in adults   . Pulmonary embolism (HCC)    Provoked 2013, Unprovoked 2017  . Pulmonary nodule    56mm, stable 2013 to 2017, benign  . Sleep apnea    CPAP  . Small bowel obstruction [facility])    Past Surgical History:  Past Surgical History:  Procedure Laterality Date  . APPLICATION OF WOUND VAC  12/04/2011   Procedure: APPLICATION OF WOUND VAC;  Surgeon: Adolph Pollack, MD;  Location: [facility] ORS;  Service: General;;  x two  . BIOPSY  02/26/2020   Procedure: BIOPSY;  Surgeon: Meryl Dare, MD;  Location: [facility];  Service: Endoscopy;;  . COLONOSCOPY WITH PROPOFOL N/A 02/26/2020   Procedure: COLONOSCOPY WITH PROPOFOL;  Surgeon: Meryl Dare, MD;  Location: [facility] ENDOSCOPY;  Service: Endoscopy;  Laterality: N/A;  . DILATION AND CURETTAGE OF UTERUS     D and C x 3   . ESOPHAGOGASTRODUODENOSCOPY (EGD) WITH PROPOFOL N/A 02/26/2020   Procedure: ESOPHAGOGASTRODUODENOSCOPY (EGD) WITH PROPOFOL;  Surgeon: Meryl Dare, MD;  Location: [facility] ENDOSCOPY;  Service: Endoscopy;  Laterality: N/A;  . INCISIONAL HERNIA REPAIR  12/04/2011   Procedure: HERNIA REPAIR INCISIONAL;  Surgeon: Adolph Pollack, MD;  Location: [facility];  Service: General;  Laterality: N/A;  . KIDNEY CYST REMOVAL    . LAPAROSCOPIC NEPHRECTOMY  08/19/2011   Procedure: LAPAROSCOPIC NEPHRECTOMY;  Surgeon:  Milford Cage, MD;  Location: [facility];  Service: Urology;  Laterality: Right;  . LAPAROTOMY  12/04/2011   Procedure: EXPLORATORY LAPAROTOMY;  Surgeon: Adolph Pollack, MD;  Location: [facility];  Service: General;  Laterality: N/A;   bowel resection   . TUBAL LIGATION     HPI:  79 year old with history of PE on Xarelto, hypertension, hyperlipidemia, obesity, sleep apnea, Admitted with hypoxic, hypercarbic respiratory failure, Underwent colonoscopy 7/26 and had been feeling weak with desats. Noted to be confused at home 7/27, Intubated in the ED with altered mental status. has a past medical history of Arthritis, Asthma, Chronic kidney disease, Diverticulosis, Hyperlipidemia, Hyperparathyroidism, Hypertension, IDA (iron deficiency anemia), Kidney atrophy, Myocardial infarction (HCC), Obesity, Pica in adults, Pulmonary embolism (HCC), Pulmonary nodule, Sleep apnea, and Small bowel obstruction (HCC).  Intubated 7/27-7/29 at 1130 am.  CT chest 7/27 Bilateral lower lobe consolidation with rounded right upper lobeconsolidation consistent with multifocal infiltrate.  Swallow eval ordered.  Pt also with recent endoscopy for anemia.  Endoscopy showed "Erosive gastropathy with no bleeding and no stigmata of recent bleeding. Biopsied. Small hiatal hernia."   Assessment / Plan / Recommendation Clinical Impression  Pt with negative CN exam regarding swallow musculature. However she demonstrates a wheeze (per CCS at upper airway) and her RR is in the mid 20s to mid 30s during session with shallow respirations.  Pt would close her eyes  during evaluation unless consistently stimulated.  Provided pt with tsp of cold water = after which she orally held requiring cues to swallow.  She sounded mildly congested after swallow of tsp of water but did not cough to clear.  Due to mentation and concern for upper airway edema from intubation - recommend npo x oral moisture.  SLP will follow up next date for po readiness. Of  note, pt does report some occasional coughing with solids more than liquids prior to admission.  Note h/o reflux and hiatal hernia.    SLP educated pt to recommendations of npo, oral moisture and SLP follow up in am.  She was agreeable to plan, hopeful for improvement to allow po rapidly as possible laryngeal/upper airway edema resolves and mentation improves.  Spoke to RN re: recommendations. SLP Visit Diagnosis: Dysphagia, unspecified (R13.10)    Aspiration Risk  Moderate aspiration risk;Severe aspiration risk    Diet Recommendation NPO (oral moisture)   Medication Administration: Via alternative means    Other  Recommendations Oral Care Recommendations: Oral care QID   Follow up Recommendations   TBD     Frequency and Duration min 2x/week  1 week       Prognosis Prognosis for Safe Diet Advancement: Good      Swallow Study   General Date of Onset: 02/29/20 HPI: 79 year old with history of PE on Xarelto, hypertension, hyperlipidemia, obesity, sleep apnea, Admitted with hypoxic, hypercarbic respiratory failure, Underwent colonoscopy 7/26 and had been feeling weak with desats. Noted to be confused at home 7/27, Intubated in the ED with altered mental status. has a past medical history of Arthritis, Asthma, Chronic kidney disease, Diverticulosis, Hyperlipidemia, Hyperparathyroidism, Hypertension, IDA (iron deficiency anemia), Kidney atrophy, Myocardial infarction (HCC), Obesity, Pica in adults, Pulmonary embolism (HCC), Pulmonary nodule, Sleep apnea, and Small bowel obstruction (HCC).  Intubated 7/27-7/29 at 1130 am.  CT chest 7/27 Bilateral lower lobe consolidation with rounded right upper lobeconsolidation consistent with multifocal infiltrate.  Swallow eval ordered.  Pt also with recent endoscopy for anemia.  Endoscopy showed "Erosive gastropathy with no bleeding and no stigmata of recent bleeding. Biopsied. Small hiatal hernia." Type of Study: Bedside Swallow Evaluation Previous  Swallow Assessment: endoscopy Diet Prior to this Study: NPO Respiratory Status: Nasal cannula (2 liters) History of Recent Intubation: Yes Length of Intubations (days): 2 days Date extubated: 02/29/20 Behavior/Cognition: Alert;Cooperative Oral Cavity Assessment: Within Functional Limits Oral Care Completed by SLP: Yes Oral Cavity - Dentition: Adequate natural dentition Vision: Functional for self-feeding Self-Feeding Abilities: Able to feed self Patient Positioning: Upright in bed Baseline Vocal Quality: Low vocal intensity Volitional Cough: Weak Volitional Swallow: Able to elicit    Oral/Motor/Sensory Function     Ice Chips Ice chips: Not tested Other Comments: pt reports that ice chips bother her teeth, therefore did not provide her with any   Thin Liquid Thin Liquid: Impaired Presentation: Spoon Oral Phase Impairments: Poor awareness of bolus Oral Phase Functional Implications: Oral holding Pharyngeal  Phase Impairments: Suspected delayed Swallow    Nectar Thick Nectar Thick Liquid: Not tested   Honey Thick Honey Thick Liquid: Not tested   Puree Puree: Not tested   Solid     Solid: Not tested      Chales Abrahams 02/29/2020,5:00 PM   Rolena Infante, MS [facility] SLP Acute Rehab Services Office [phone]

## 2020-02-29 NOTE — Procedures (Signed)
Extubation Procedure Note  Patient Details:   Name: Tina Chase DOB: 02-02-1941 MRN: [id number]   Airway Documentation:    Vent end date: 02/29/20 Vent end time: 1137   Evaluation  O2 sats: stable throughout Complications: No apparent complications Patient did tolerate procedure well. Bilateral Breath Sounds: Diminished   Yes  Suzan Garibaldi 02/29/2020, 11:37 AM

## 2020-02-29 NOTE — Progress Notes (Signed)
Pt extubated by RT at 1137 per MD orders. Pt is on 2L [place], O2 sat 92%. Pt is clearing secretions well. This RN will continue to monitor patient's response to interventions.

## 2020-02-29 NOTE — ED Provider Notes (Signed)
  [facility]  79 year old with history of PE on Xarelto, hypertension, hyperlipidemia, obesity, sleep apnea presented to ER with concern for respiratory distress, altered mental status.  Labs showing hypercapnia.  Started on trial of BiPAP however patient had worsening mental status and proceeded with RSI for airway protection, acute cephalopathy, respiratory failure.  Intubation successful, no immediate complications.  Underlying etiology suspect is multifactorial including OSA, morbid obesity, obesity hypoventilation syndrome.  CTA chest to rule out PE. Admitted to ICU team.    Procedures .Critical Care Performed by: Milagros Loll, MD Authorized by: Milagros Loll, MD   Critical care provider statement:    Critical care time (minutes):  45   Critical care was necessary to treat or prevent imminent or life-threatening deterioration of the following conditions:  Respiratory failure and circulatory failure   Critical care was time spent personally by me on the following activities:  Discussions with consultants, evaluation of patient's response to treatment, examination of patient, ordering and performing treatments and interventions, ordering and review of laboratory studies, ordering and review of radiographic studies, pulse oximetry, re-evaluation of patient's condition, obtaining history from patient or surrogate and review of old charts Comments:           Procedure Name: Intubation Date/Time: 02/29/2020 7:26 AM Performed by: Milagros Loll, MD Pre-anesthesia Checklist: Patient identified and Timeout performed Preoxygenation: preoxygenation with BiPAP. Induction Type: Rapid sequence Laryngoscope Size: Glidescope and 3 Grade View: Grade I Tube size: 7.5 mm Number of attempts: 1 Placement Confirmation: ETT inserted through vocal cords under direct vision and Positive ETCO2 Secured at: 23 cm Tube secured with: ETT holder Comments: Preoxygenated with  100% BiPAP, glidescope, cric pressure, atraumatic, one attempt, no desaturations      (including critical care time)   Final Clinical Impression(s) / ED Diagnoses Final diagnoses:  Acute respiratory failure with hypercapnia (HCC)  Altered mental status, unspecified altered mental status type  Hypokalemia  Elevated troponin  Elevated brain natriuretic peptide (BNP) level    Rx / DC Orders ED Discharge Orders    None       Milagros Loll, MD 02/29/20 [phone]

## 2020-02-29 NOTE — Progress Notes (Signed)
45 mLs of fentanyl and 22 mLs of versed wasted to steri bin with Hazel Sams, RN.

## 2020-02-29 NOTE — Progress Notes (Signed)
eLink Physician-Brief Progress Note Patient Name: Tina Chase DOB: [birth year] MRN: [id number]   Date of Service  02/29/2020  HPI/Events of Note  Patient needs phosphate replacement but is NPO, K+ 3.7.  eICU Interventions  Order entered for 10 mmol of K+ phos.        Migdalia Dk 02/29/2020, 9:47 PM

## 2020-02-29 NOTE — Progress Notes (Signed)
Spoke  With patient and family members regarding PICC insertion.  Explained the benefits and risk of PICC.  Patient unsure if they wish to proceed with insertion and wish to think about it.  Informed floor RN Hope.  Will follow up later today.  Thanks

## 2020-02-29 NOTE — Progress Notes (Signed)
Hypoglycemic Event  CBG: 69 02/29/2020 0312   Treatment: 1/2 amp D50  Follow-up CBG: Time:0350 CBG Result:93  Comments/MD notified: Marlane Mingle MD

## 2020-03-01 LAB — BASIC METABOLIC PANEL
Anion gap: 9 (ref 5–15)
BUN: 14 mg/dL (ref 8–23)
CO2: 33 mmol/L — ABNORMAL HIGH (ref 22–32)
Calcium: 9 mg/dL (ref 8.9–10.3)
Chloride: 100 mmol/L (ref 98–111)
Creatinine, Ser: 0.58 mg/dL (ref 0.44–1.00)
GFR calc Af Amer: >60 mL/min (ref >60)
GFR calc non Af Amer: >60 mL/min (ref >60)
Glucose, Bld: 83 mg/dL (ref 70–99)
Potassium: 4.2 mmol/L (ref 3.5–5.1)
Sodium: 142 mmol/L (ref 135–145)

## 2020-03-01 LAB — CBC
HCT: 40 % (ref 36.0–46.0)
Hemoglobin: 12.8 g/dL (ref 12.0–15.0)
MCH: 30.9 pg (ref 26.0–34.0)
MCHC: 32 g/dL (ref 30.0–36.0)
MCV: 96.6 fL (ref 80.0–100.0)
Platelets: 162 10*3/uL (ref 150–400)
RBC: 4.14 MIL/uL (ref 3.87–5.11)
RDW: 16.2 % — ABNORMAL HIGH (ref 11.5–15.5)
WBC: 12.5 10*3/uL — ABNORMAL HIGH (ref 4.0–10.5)
nRBC: 0.2 % (ref 0.0–0.2)

## 2020-03-01 LAB — GLUCOSE, CAPILLARY
Glucose-Capillary: 70 mg/dL (ref 70–99)
Glucose-Capillary: 83 mg/dL (ref 70–99)
Glucose-Capillary: 83 mg/dL (ref 70–99)
Glucose-Capillary: 107 mg/dL — ABNORMAL HIGH (ref 70–99)

## 2020-03-01 MED ORDER — SODIUM CHLORIDE 0.9% FLUSH
10.0000 mL | Freq: Two times a day (BID) | INTRAVENOUS | Status: DC
  Administered 2020-03-01 – 2020-03-05 (×6): 10 mL

## 2020-03-01 MED ORDER — DEXTROSE-NACL 5-0.45 % IV SOLN: INTRAVENOUS | Status: DC

## 2020-03-01 MED ORDER — DEXTROSE 50 % IV SOLN
12.5000 g | Freq: Once | INTRAVENOUS | Status: AC
  Administered 2020-03-01: 12.5 g via INTRAVENOUS

## 2020-03-01 MED ORDER — SODIUM CHLORIDE 0.9% FLUSH: 10.0000 mL | INTRAVENOUS | Status: DC | PRN

## 2020-03-01 MED ORDER — FUROSEMIDE 10 MG/ML IJ SOLN
40.0000 mg | Freq: Once | INTRAMUSCULAR | Status: AC
  Administered 2020-03-01: 40 mg via INTRAVENOUS
  Filled 2020-03-01: qty 4

## 2020-03-01 NOTE — Progress Notes (Addendum)
NAME:  Tina Chase, MRN:  [id number], DOB:  Apr 09, 1941, LOS: 3 ADMISSION DATE:  02/27/2020, CONSULTATION DATE: 02/27/2020 REFERRING MD: EDP, CHIEF COMPLAINT: Hypoxic, hypercarbic respiratory failure  Brief History   79 year old with history of PE on Xarelto, hypertension, hyperlipidemia, obesity, sleep apnea Admitted with hypoxic, hypercarbic respiratory failure Underwent colonoscopy 7/26 and had been feeling weak with desats. Noted to be confused at home 7/27 Intubated in the ED with altered mental status.   Past Medical History    has a past medical history of Arthritis, Asthma, Chronic kidney disease, Diverticulosis, Hyperlipidemia, Hyperparathyroidism, Hypertension, IDA (iron deficiency anemia), Kidney atrophy, Myocardial infarction (HCC), Obesity, Pica in adults, Pulmonary embolism (HCC), Pulmonary nodule, Sleep apnea, and Small bowel obstruction (HCC).  Significant Hospital Events   7/27 Admit 7/28 Remains on vent  Consults:  PCCM  Procedures:  ETT 7/27 >> 7/29  Significant Diagnostic Tests:  CT Head 7/27 >> no acute intracranial abnormality  CTA Chest 7/27 >> no evidence of PE, bilateral lower lobe consolidation consistent with multifocal infiltrates, no associated effusions  Micro Data:  COVID 7/27 >> negative  BCx2 7/27 >>  UC 7/27 >> multiple species, suggest recollect MRSA PCR 7/27 >> negative   Antimicrobials:  Unasyn 7/27 >>   Interim history/subjective:  Afebrile / WBC 12.5 Glucose range 70-83  I/O 1.9L UOP, +239ml in last 24 hours  Pt reports she wore her CPAP overnight.  Indicates home machine has a broken piece on her mask   Objective   Blood pressure (!) 120/58, pulse 63, temperature 98.4 F (36.9 C), temperature source Axillary, resp. rate (!) 24, height 5\' 1"  (1.549 m), weight (!) 144.5 kg, last menstrual period 09/23/1986, SpO2 97 %.    Vent Mode: PSV;CPAP FiO2 (%):  [30 %] 30 % PEEP:  [5 cmH20] 5 cmH20   Intake/Output Summary (Last 24 hours)  at 03/01/2020 03/03/2020 Last data filed at 03/01/2020 03/03/2020 Gross per 24 hour  Intake 1153.59 ml  Output 1900 ml  Net -746.41 ml   Filed Weights   02/28/20 0207 02/29/20 0500  Weight: (!) 137.6 kg (!) 144.5 kg    Examination: General: elderly obese female lying in bed in NAD on CPAP  HEENT: MM pink/dry, CPAP mask in place, anicteric  Neuro: AAOx4, speech clear, MAE CV: s1s2 RRR, no m/r/g PULM: non-labored on CPAP, lungs bilaterally distant but clear  GI: soft, bsx4 active  Extremities: warm/dry, 1+ BLE pitting edema  Skin: no rashes or lesions  Resolved Hospital Problem list     Assessment & Plan:   Acute hypoxic, hypercarbic respiratory failure Bilateral Lower Lobe Consolidation, Possible Aspiration PNA Baseline Severe OSA (AHI 124.5/h) Enlarged Pulmonary Trunk on CT / Suspected Pulmonary Hypertension  Unclear etiology. May have aspirated during procedure or not used her CPAP at night due to weakness post colonoscopy yesterday. Note baseline CO2 on BMP ~30.   -SBT with goal for extubation  -wean PEEP / FiO2 for sats >90% -follow cultures  -intermittent CXR  -will need mandatory CPAP post extubation QHS and PRN daytime sleep  -lasix 40 mg IV x1  -await SLP repeat evaluation  -consult Care Management to assist with checking home machine / can [employer] go out to assess mask?  She needs to wear her CPAP QHS and PRN daytime sleep  -await PT evaluation   History of pulmonary embolism x2, on Eliquis No PE noted on admit 7/27 CTA imaging.  Life long anticoagulation given recurrent PE.  -continue eliquis  -follow CBC  -  may not need PICC line at this point   Hypertension, hyperlipidemia -hold home antihypertensives  -follow BMP trend  AKI on CKD  -Trend BMP / urinary output -Replace electrolytes as indicated  Best practice:  Diet: NPO  Pain/Anxiety/Delirium protocol (if indicated): n/a VAP protocol (if indicated): n/a DVT prophylaxis:  xarelto  GI prophylaxis: PPI Glucose  control: Monitor Mobility: Bed Code Status: Full Family Communication: Patient updated on plan of care 7/30 Disposition: SDU to [facility] as of 7/31   Labs   CBC: Recent Labs  Lab 02/27/20 1352 02/28/20 0235 02/29/20 0524 03/01/20 0222  WBC 12.7* 13.0* 14.3* 12.5*  NEUTROABS 9.2*  --   --   --   HGB 14.9 14.0 13.1 12.8  HCT 48.9* 44.4 38.8 40.0  MCV 102.9* 97.6 94.9 96.6  PLT 238 175 141* 162    Basic Metabolic Panel: Recent Labs  Lab 02/27/20 1455 02/28/20 0235 02/28/20 0401 02/28/20 1636 02/29/20 0524 03/01/20 0222  NA 146* 142 144  --  141 142  K 2.0* 5.3* 4.9  --  3.7 4.2  CL 123* 100 99  --  100 100  CO2 19* 26 28  --  32 33*  GLUCOSE 66* 93 97  --  91 83  BUN 9 16 16   --  18 14  CREATININE 0.45 1.12* 1.01*  --  0.83 0.58  CALCIUM 4.2* 9.5 9.6  --  9.0 9.0  MG  --  2.1  --  2.0 1.8  --   PHOS  --  4.1  --  2.1* 2.2*  --    GFR: Estimated Creatinine Clearance: 80.4 mL/min (by C-G formula based on SCr of 0.58 mg/dL). Recent Labs  Lab 02/27/20 1352 02/28/20 0235 02/29/20 0524 03/01/20 0222  WBC 12.7* 13.0* 14.3* 12.5*    Liver Function Tests: Recent Labs  Lab 02/27/20 1455  AST 28  ALT 15  ALKPHOS 42  BILITOT <0.1*  PROT 3.2*  ALBUMIN 1.5*   No results for input(s): LIPASE, AMYLASE in the last 168 hours. No results for input(s): AMMONIA in the last 168 hours.  ABG    Component Value Date/Time   PHART 7.441 02/28/2020 0355   PCO2ART 45.8 02/28/2020 0355   PO2ART 87.8 02/28/2020 0355   HCO3 30.6 (H) 02/28/2020 0355   TCO2 28 11/06/2015 1315   O2SAT 95.0 02/28/2020 0355     Coagulation Profile: No results for input(s): INR, PROTIME in the last 168 hours.  Cardiac Enzymes: No results for input(s): CKTOTAL, CKMB, CKMBINDEX, TROPONINI in the last 168 hours.  HbA1C: Hemoglobin A1C  Date/Time Value Ref Range Status  04/10/2014 09:32 AM 5.5  Final  09/23/2010 12:00 AM 5.6  Final   Hgb A1c MFr Bld  Date/Time Value Ref Range Status    07/06/2016 09:08 AM 5.4 4.8 - 5.6 % Final    Comment:             Pre-diabetes: 5.7 - 6.4          Diabetes: >6.4          Glycemic control for adults with diabetes: <7.0     CBG: Recent Labs  Lab 02/29/20 1538 02/29/20 1959 02/29/20 2324 03/01/20 0342 03/01/20 0749  GLUCAP 103* 108* 73 70 83      Critical care time: n/a    03/03/20, MSN, NP-C [facility] 03/01/2020, 9:09 AM   Please see [email or web address] for pager details.

## 2020-03-01 NOTE — Progress Notes (Signed)
  Speech Language Pathology Treatment: Dysphagia  Patient Details Name: Tina Chase MRN: [id number] DOB: 09-Jun-1941 Today's Date: 03/01/2020 Time: 1115-1140 SLP Time Calculation (min) (ACUTE ONLY): 25 min  Assessment / Plan / Recommendation Clinical Impression  Pt seen to determine readiness for dietary advancement.  No wheeze evident today and pt's voice is stronger - pt reports not quite back to baseline.  She is able to self feed cracker, applesauce and water.  Mastication of cracker functional without dentition and swallow clinically appeared timely with laryngeal elevation observed.  She easily passed 3 ounce Yale water test  Pt states her swallow function is at baseline.  She was able to cough and expectorate approximately a quarter sized viscous secretions- white tinged and states that was the first productive cough.  Intermittent cough noted, likely due to mobilization and clearance of secretions.   Given pt's remarkabe improvement, hopefully decreased potential edema of pharynx/larynx- recommend advance diet as tolerated.  Educated pt to general aspiration and esophageal precautions. Pt independently states need to sit fully upright for po.  Pt does reports large pill dysphagia and thus advised she take large pills with puree.    No SLP follow up indicated, Thanks for allowing me to help care for this pt.    HPI HPI: 79 year old with history of PE on Xarelto, hypertension, hyperlipidemia, obesity, sleep apnea, Admitted with hypoxic, hypercarbic respiratory failure, Underwent colonoscopy 7/26 and had been feeling weak with desats. Noted to be confused at home 7/27, Intubated in the ED with altered mental status. has a past medical history of Arthritis, Asthma, Chronic kidney disease, Diverticulosis, Hyperlipidemia, Hyperparathyroidism, Hypertension, IDA (iron deficiency anemia), Kidney atrophy, Myocardial infarction ([place]), Obesity, Pica in adults, Pulmonary embolism ([place]), Pulmonary nodule,  Sleep apnea, and Small bowel obstruction ([place]).  Intubated 7/27-7/29 at 1130 am.  CT chest 7/27 Bilateral lower lobe consolidation with rounded right upper lobeconsolidation consistent with multifocal infiltrate.  Swallow eval ordered.  Pt also with recent endoscopy for anemia.  Endoscopy showed "Erosive gastropathy with no bleeding and no stigmata of recent bleeding. Biopsied. Small hiatal hernia."      SLP Plan  All goals met       Recommendations  Diet recommendations: Regular;Thin liquid Liquids provided via: Cup;Straw Medication Administration: Whole meds with liquid (large pills with puree) Supervision: Patient able to self feed Compensations: Slow rate;Small sips/bites Postural Changes and/or Swallow Maneuvers: Upright 30-60 min after meal;Seated upright 90 degrees                Oral Care Recommendations: Oral care QID Follow up Recommendations: None Plan: All goals met       GO              Kathleen Lime, MS [facility] SLP Acute Rehab Services Office [phone]   Macario Golds 03/01/2020, 11:59 AM

## 2020-03-01 NOTE — Progress Notes (Signed)
eLink Physician-Brief Progress Note Patient Name: Tina Chase DOB: [birth year] MRN: [id number]   Date of Service  03/01/2020  HPI/Events of Note  Blood sugar is low normal, patient is NPO.  eICU Interventions  D 5 I/2 NS ordered at 50 ml / hour.        [name] 03/01/2020, 4:16 AM

## 2020-03-01 NOTE — Evaluation (Signed)
Physical Therapy Evaluation Patient Details Name: Tina Chase MRN: [id number] DOB: [birth year] Today's Date: 03/01/2020   History of Present Illness  79 year old with history of PE on Xarelto, hypertension, hyperlipidemia, obesity, sleep apnea, Admitted with hypoxic, hypercarbic respiratory failure, Underwent colonoscopy 7/26 and had been feeling weak with desats. Noted to be confused at home 7/27, Intubated in the ED with altered mental status. has a past medical history of Arthritis, Asthma, Chronic kidney disease, Diverticulosis, Hyperlipidemia, Hyperparathyroidism, Hypertension, IDA (iron deficiency anemia), Kidney atrophy, Myocardial infarction (HCC), Obesity, Pica in adults, Pulmonary embolism (HCC), Pulmonary nodule, Sleep apnea, and Small bowel obstruction (HCC).  Intubated 7/27-7/29 at 1130 am.  CT chest 7/27 Bilateral lower lobe consolidation with rounded right upper lobeconsolidation consistent with multifocal infiltrate  Clinical Impression  The patient mobilized well and ambulated 25 ' with RW. Patient  On  5 l at 95%, HR 101.Placed on RA briefly with drop to 85%.  Replaced back on 6 L for mobility. HR up to 115. Patient lives alone and prefers to return home. Hopefully will progress to return home. Pt admitted with above diagnosis.  Pt currently with functional limitations due to the deficits listed below (see PT Problem List). Pt will benefit from skilled PT to increase their independence and safety with mobility to allow discharge to the venue listed below.        Follow Up Recommendations Home health PT;Supervision - Intermittent vs SNF-pt. Does not want SNF.    Equipment Recommendations  None recommended by PT    Recommendations for Other Services       Precautions / Restrictions Precautions Precautions: Fall Precaution Comments: monitor VS Restrictions Weight Bearing Restrictions: No      Mobility  Bed Mobility Overal bed mobility: Needs Assistance Bed Mobility:  Supine to Sit;Sit to Supine     Supine to sit: Min assist Sit to supine: Min assist   General bed mobility comments: Min assit for LEs in and out of bed - OOB patient had difficulty getting legs to side of bed due to slick sheet.  Transfers Overall transfer level: Needs assistance Equipment used: Rolling walker (2 wheeled) Transfers: Sit to/from [employer] Sit to Stand: Min assist;+2 safety/equipment;From elevated surface Stand pivot transfers: Min guard       General transfer comment: steady assist to rise from bed and recliner, pivot steps from recliner to bed , 1 hand on RW, min guard.  Ambulation/Gait Ambulation/Gait assistance: Min assist;+2 safety/equipment Gait Distance (Feet): 15 Feet Assistive device: Rolling walker (2 wheeled) Gait Pattern/deviations: Step-through pattern Gait velocity: decr   General Gait Details: slow steady gait using RW  Stairs            Wheelchair Mobility    Modified Rankin (Stroke Patients Only)       Balance Overall balance assessment: Mild deficits observed, not formally tested Sitting-balance support: Bilateral upper extremity supported;Feet supported Sitting balance-Leahy Scale: Fair     Standing balance support: Bilateral upper extremity supported Standing balance-Leahy Scale: Fair                               Pertinent Vitals/Pain Pain Assessment: No/denies pain    Home Living Family/patient expects to be discharged to:: Private residence Living Arrangements: Alone Available Help at Discharge: Family;Available PRN/intermittently Type of Home: Apartment Home Access: Elevator     Home Layout: One level Home Equipment: Walker - 4 wheels;Shower seat  Prior Function Level of Independence: Independent         Comments: does not drive     Hand Dominance   Dominant Hand: Right    Extremity/Trunk Assessment   Upper Extremity Assessment Upper Extremity Assessment:  Overall WFL for tasks assessed    Lower Extremity Assessment Lower Extremity Assessment: Defer to PT evaluation    Cervical / Trunk Assessment Cervical / Trunk Assessment: Normal  Communication   Communication: No difficulties  Cognition Arousal/Alertness: Awake/alert Behavior During Therapy: WFL for tasks assessed/performed Overall Cognitive Status: Within Functional Limits for tasks assessed                                        General Comments      Exercises     Assessment/Plan    PT Assessment Patient needs continued PT services  PT Problem List Decreased strength;Decreased mobility;Decreased safety awareness;Decreased knowledge of precautions;Decreased activity tolerance;Decreased knowledge of use of DME       PT Treatment Interventions DME instruction;Therapeutic activities;Gait training;Therapeutic exercise;Patient/family education;Functional mobility training    PT Goals (Current goals can be found in the Care Plan section)  Acute Rehab PT Goals Patient Stated Goal: go home, not rehab again PT Goal Formulation: With patient/family Time For Goal Achievement: 03/15/20 Potential to Achieve Goals: Good    Frequency Min 3X/week   Barriers to discharge        Co-evaluation   Reason for Co-Treatment: For patient/therapist safety;To address functional/ADL transfers PT goals addressed during session: Mobility/safety with mobility OT goals addressed during session: ADL's and self-care       AM-PAC PT "6 Clicks" Mobility  Outcome Measure Help needed turning from your back to your side while in a flat bed without using bedrails?: A Little Help needed moving from lying on your back to sitting on the side of a flat bed without using bedrails?: A Little Help needed moving to and from a bed to a chair (including a wheelchair)?: A Little Help needed standing up from a chair using your arms (e.g., wheelchair or bedside chair)?: A Little Help needed  to walk in hospital room?: A Lot Help needed climbing 3-5 steps with a railing? : Total 6 Click Score: 15    End of Session Equipment Utilized During Treatment: Gait belt Activity Tolerance: Patient tolerated treatment well Patient left: in bed;with call bell/phone within reach;with nursing/sitter in room Nurse Communication: Mobility status PT Visit Diagnosis: Unsteadiness on feet (R26.81);Difficulty in walking, not elsewhere classified (R26.2)    Time: 5945-8592 PT Time Calculation (min) (ACUTE ONLY): 47 min   Charges:   PT Evaluation $PT Eval Moderate Complexity: 1 Mod          Blanchard Kelch PT Acute Rehabilitation Services Pager [phone] Office [phone]   Rada Hay 03/01/2020, 11:32 AM

## 2020-03-01 NOTE — Progress Notes (Signed)
Called report to [facility] RN and transferred the patient by bed at 20:55 to RM [id number]

## 2020-03-01 NOTE — Evaluation (Signed)
Occupational Therapy Evaluation Patient Details Name: Tina Chase MRN: [id number] DOB: [birth year] Today's Date: 03/01/2020    History of Present Illness 79 year old with history of PE on Xarelto, hypertension, hyperlipidemia, obesity, sleep apnea, Admitted with hypoxic, hypercarbic respiratory failure, Underwent colonoscopy 7/26 and had been feeling weak with desats. Noted to be confused at home 7/27, Intubated in the ED with altered mental status. has a past medical history of Arthritis, Asthma, Chronic kidney disease, Diverticulosis, Hyperlipidemia, Hyperparathyroidism, Hypertension, IDA (iron deficiency anemia), Kidney atrophy, Myocardial infarction (HCC), Obesity, Pica in adults, Pulmonary embolism (HCC), Pulmonary nodule, Sleep apnea, and Small bowel obstruction (HCC).  Intubated 7/27-7/29 at 1130 am.  CT chest 7/27 Bilateral lower lobe consolidation with rounded right upper lobeconsolidation consistent with multifocal infiltrate   Clinical Impression   Tina Chase is a 79 year old woman s/p respiratory failure and extubation who presents with generalized weakness and decreased activity tolerance compared to her baseline resulting in needing assistance for transfers, ambulation and ADLs. Patient min assist for bed mobility, min guard for ambulating in room with walker (typically doesn't use a device) and needing assistance for lower body dressing and toileting. Patient will benefit from skilled OT services in order to improve deficits and learn compensatory strategies as needed in order to return to PLOF.     Follow Up Recommendations  Home health OT    Equipment Recommendations  None recommended by OT    Recommendations for Other Services       Precautions / Restrictions Precautions Precautions: Fall Precaution Comments: monitor VS Restrictions Weight Bearing Restrictions: No      Mobility Bed Mobility Overal bed mobility: Needs Assistance Bed Mobility: Supine to  Sit;Sit to Supine     Supine to sit: Min assist Sit to supine: Min assist   General bed mobility comments: Min assit for LEs in and out of bed - OOB patient had difficulty getting legs to side of bed due to slick sheet.  Transfers Overall transfer level: Needs assistance Equipment used: Rolling walker (2 wheeled) Transfers: Sit to/from [employer] Sit to Stand: Min assist;+2 safety/equipment;From elevated surface Stand pivot transfers: Min guard       General transfer comment: steady assist to rise from bed and recliner, pivot steps from recliner to bed , 1 hand on RW, min guard.    Balance Overall balance assessment: Mild deficits observed, not formally tested Sitting-balance support: Bilateral upper extremity supported;Feet supported Sitting balance-Leahy Scale: Fair     Standing balance support: Bilateral upper extremity supported Standing balance-Leahy Scale: Fair                             ADL either performed or assessed with clinical judgement   ADL Overall ADL's : Needs assistance/impaired Eating/Feeding: Independent   Grooming: Wash/dry hands;Wash/dry face;Standing;Min guard Grooming Details (indicate cue type and reason): washed hands and face at sink. Upper Body Bathing: Sitting;Set up   Lower Body Bathing: Minimal assistance;Sit to/from stand   Upper Body Dressing : Set up;Sitting   Lower Body Dressing: Maximal assistance Lower Body Dressing Details (indicate cue type and reason): max assist to donn socks today at side of bed. Toilet Transfer: Min guard;+2 for safety/equipment;RW;BSC   Toileting- Architect and Hygiene: Moderate assistance Toileting - Clothing Manipulation Details (indicate cue type and reason): Assistance clothing management. Patient able to wipe but not with typical quality. limiting by IV in left hand and sat proble on  right. Therapist provided min assist with hygiene.     Functional mobility  during ADLs: Min guard;+2 for safety/equipment       Vision   Vision Assessment?: No apparent visual deficits     Perception     Praxis      Pertinent Vitals/Pain Pain Assessment: No/denies pain     Hand Dominance Right   Extremity/Trunk Assessment Upper Extremity Assessment Upper Extremity Assessment: Overall WFL for tasks assessed   Lower Extremity Assessment Lower Extremity Assessment: Defer to PT evaluation   Cervical / Trunk Assessment Cervical / Trunk Assessment: Normal   Communication Communication Communication: No difficulties   Cognition Arousal/Alertness: Awake/alert Behavior During Therapy: WFL for tasks assessed/performed Overall Cognitive Status: Within Functional Limits for tasks assessed                                     General Comments       Exercises     Shoulder Instructions      Home Living Family/patient expects to be discharged to:: Private residence Living Arrangements: Alone Available Help at Discharge: Family;Available PRN/intermittently Type of Home: Apartment Home Access: Elevator     Home Layout: One level     Bathroom Shower/Tub: Tub/shower unit;Curtain   Bathroom Toilet: Standard Bathroom Accessibility: Yes   Home Equipment: Environmental consultant - 4 wheels;Shower seat          Prior Functioning/Environment Level of Independence: Independent        Comments: does not drive        OT Problem List: Decreased activity tolerance;Impaired balance (sitting and/or standing);Decreased knowledge of use of DME or AE;Obesity      OT Treatment/Interventions: Self-care/ADL training;DME and/or AE instruction;Therapeutic activities;Balance training;Therapeutic exercise;Patient/family education    OT Goals(Current goals can be found in the care plan section) Acute Rehab OT Goals Patient Stated Goal: go home, not rehab again OT Goal Formulation: With patient Time For Goal Achievement: 03/15/20 Potential to Achieve  Goals: Good  OT Frequency: Min 2X/week   Barriers to D/C: Decreased caregiver support          Co-evaluation PT/OT/SLP Co-Evaluation/Treatment: Yes Reason for Co-Treatment: For patient/therapist safety;To address functional/ADL transfers PT goals addressed during session: Mobility/safety with mobility OT goals addressed during session: ADL's and self-care      AM-PAC OT "6 Clicks" Daily Activity     Outcome Measure Help from another person eating meals?: None Help from another person taking care of personal grooming?: A Little Help from another person toileting, which includes using toliet, bedpan, or urinal?: A Lot Help from another person bathing (including washing, rinsing, drying)?: A Little Help from another person to put on and taking off regular upper body clothing?: A Little Help from another person to put on and taking off regular lower body clothing?: A Lot 6 Click Score: 17   End of Session Equipment Utilized During Treatment: Gait belt;Rolling walker Nurse Communication: Mobility status  Activity Tolerance: Patient tolerated treatment well Patient left: in bed;with call bell/phone within reach;with nursing/sitter in room  OT Visit Diagnosis: Unsteadiness on feet (R26.81);Muscle weakness (generalized) (M62.81)                Time: 7062-3762 OT Time Calculation (min): 45 min Charges:  OT General Charges $OT Visit: 1 Visit OT Evaluation $OT Eval Moderate Complexity: 1 Mod OT Treatments $Self Care/Home Management : 8-22 mins  Kyleen Villatoro, OTR/L Acute Care Rehab Services  Office [phone] Pager: [phone]   Kelli Churn 03/01/2020, 11:25 AM

## 2020-03-01 NOTE — Progress Notes (Signed)
Peripherally Inserted Central Catheter Placement  The IV Nurse has discussed with the patient and/or persons authorized to consent for the patient, the purpose of this procedure and the potential benefits and risks involved with this procedure.  The benefits include less needle sticks, lab draws from the catheter, and the patient may be discharged home with the catheter. Risks include, but not limited to, infection, bleeding, blood clot (thrombus formation), and puncture of an artery; nerve damage and irregular heartbeat and possibility to perform a PICC exchange if needed/ordered by physician.  Alternatives to this procedure were also discussed.  Bard Power PICC patient education guide, fact sheet on infection prevention and patient information card has been provided to patient /or left at bedside.    PICC Placement Documentation  PICC Double Lumen 03/01/20 PICC Right Cephalic 34 cm 0 cm (Active)  Indication for Insertion or Continuance of Line Poor Vasculature-patient has had multiple peripheral attempts or PIVs lasting less than 24 hours 03/01/20 1100  Exposed Catheter (cm) 0 cm 03/01/20 1100  Site Assessment Clean;Dry;Intact 03/01/20 1100  Lumen #1 Status Flushed;Saline locked;Blood return noted 03/01/20 1100  Lumen #2 Status Flushed;Saline locked;Blood return noted 03/01/20 1100  Dressing Type Transparent;Securing device 03/01/20 1100  Dressing Status Clean;Dry;Intact;Antimicrobial disc in place 03/01/20 1100  Dressing Change Due 03/08/20 03/01/20 1100       [name] 03/01/2020, 11:01 AM

## 2020-03-02 ENCOUNTER — Other Ambulatory Visit: Payer: Self-pay

## 2020-03-02 LAB — COMPREHENSIVE METABOLIC PANEL
ALT: 19 U/L (ref 0–44)
AST: 24 U/L (ref 15–41)
Albumin: 2.9 g/dL — ABNORMAL LOW (ref 3.5–5.0)
Alkaline Phosphatase: 71 U/L (ref 38–126)
Anion gap: 8 (ref 5–15)
BUN: 13 mg/dL (ref 8–23)
CO2: 35 mmol/L — ABNORMAL HIGH (ref 22–32)
Calcium: 8.9 mg/dL (ref 8.9–10.3)
Chloride: 99 mmol/L (ref 98–111)
Creatinine, Ser: 0.59 mg/dL (ref 0.44–1.00)
GFR calc Af Amer: >60 mL/min (ref >60)
GFR calc non Af Amer: >60 mL/min (ref >60)
Glucose, Bld: 93 mg/dL (ref 70–99)
Potassium: 3.4 mmol/L — ABNORMAL LOW (ref 3.5–5.1)
Sodium: 142 mmol/L (ref 135–145)
Total Bilirubin: 0.8 mg/dL (ref 0.3–1.2)
Total Protein: 6.3 g/dL — ABNORMAL LOW (ref 6.5–8.1)

## 2020-03-02 LAB — CBC
HCT: 39.9 % (ref 36.0–46.0)
Hemoglobin: 12.4 g/dL (ref 12.0–15.0)
MCH: 31.1 pg (ref 26.0–34.0)
MCHC: 31.1 g/dL (ref 30.0–36.0)
MCV: 100 fL (ref 80.0–100.0)
Platelets: 168 10*3/uL (ref 150–400)
RBC: 3.99 MIL/uL (ref 3.87–5.11)
RDW: 15.9 % — ABNORMAL HIGH (ref 11.5–15.5)
WBC: 9.4 10*3/uL (ref 4.0–10.5)
nRBC: 0 % (ref 0.0–0.2)

## 2020-03-02 MED ORDER — ACETAMINOPHEN 325 MG PO TABS
650.0000 mg | ORAL_TABLET | Freq: Four times a day (QID) | ORAL | Status: DC | PRN
  Filled 2020-03-02: qty 2

## 2020-03-02 MED ORDER — POTASSIUM CHLORIDE 10 MEQ/100ML IV SOLN
10.0000 meq | INTRAVENOUS | Status: AC
  Administered 2020-03-02 (×4): 10 meq via INTRAVENOUS
  Filled 2020-03-02 (×4): qty 100

## 2020-03-02 MED ORDER — ONDANSETRON HCL 4 MG/2ML IJ SOLN: 4.0000 mg | Freq: Four times a day (QID) | INTRAMUSCULAR | Status: DC | PRN

## 2020-03-02 MED ORDER — POTASSIUM CHLORIDE CRYS ER 20 MEQ PO TBCR
40.0000 meq | EXTENDED_RELEASE_TABLET | Freq: Once | ORAL | Status: DC
  Filled 2020-03-02: qty 2

## 2020-03-02 NOTE — Progress Notes (Signed)
   03/02/20 0130  Assess: MEWS Score  ECG Heart Rate 88  Resp (!) 28  Assess: MEWS Score  MEWS Temp 0  MEWS Systolic 0  MEWS Pulse 0  MEWS RR 2  MEWS LOC 0  MEWS Score 2  MEWS Score Color Yellow  Document  Patient Outcome Other (Comment) (RT changed cpap setting)  Progress note created (see row info) Yes  Cpap settings was changed by RT. Pt Continues to have intermittent tachypnea. Pt denies SOB or difficulty breathing. States she is comfortable at this time.

## 2020-03-02 NOTE — Progress Notes (Signed)
[facility] Progress Note  Patient: Tina Chase    ERX:[id number]  DOA: 02/27/2020     Date of Service: the patient was seen and examined on 03/02/2020  Brief hospital course: Past medical history of HTN, HLD, OSA.  Presents with complaints of cough and shortness of breath found to have acute pneumonia with acute hypoxic respiratory failure requiring intubation.  Currently extubated and stable on home oxygen. Currently plan is IV antibiotics.  Assessment and Plan: Acute hypoxic, hypercarbic respiratory failure Bilateral Lower Lobe Consolidation, Possible Aspiration PNA Baseline Severe OSA (AHI 124.5/h) Enlarged Pulmonary Trunk on CT / Suspected Pulmonary Hypertension  Unclear etiology. May have aspirated during procedure or not used her CPAP at night due to weakness post colonoscopy yesterday. Note baseline CO2 on BMP ~30.   -SBT with goal for extubation  -wean PEEP / FiO2 for sats >90% -follow cultures  -intermittent CXR  -will need mandatory CPAP post extubation QHS and PRN daytime sleep  SLP suggest regular diet -consult Care Management to assist with checking home machine / can [employer] go out to assess mask?  She needs to wear her CPAP QHS and PRN daytime sleep  PT recommends home with home health  History of pulmonary embolism x2, on Eliquis No PE noted on admit 7/27 CTA imaging.  Life long anticoagulation given recurrent PE.  -continue eliquis  -follow CBC  -may not need PICC line at this point   Hypertension, hyperlipidemia -hold home antihypertensives  -follow BMP trend  AKI on CKD  -Trend BMP / urinary output -Replace electrolytes as indicated  Morbid obesity Body mass index is 58.19 kg/m.  Nutrition Problem: Inadequate oral intake Etiology: inability to eat Interventions: Interventions: Refer to RD note for recommendations  Diet: Cardiac diet DVT Prophylaxis:   rivaroxaban (XARELTO) tablet 10 mg Start: 02/29/20 2200 SCDs Start: 02/27/20  1916 rivaroxaban (XARELTO) tablet 10 mg    Advance goals of care discussion: Full code  Family Communication: family was present at bedside, at the time of interview.  The pt provided permission to discuss medical plan with the family. Opportunity was given to ask question and all questions were answered satisfactorily.   Disposition:  Status is: Inpatient  Remains inpatient appropriate because:IV treatments appropriate due to intensity of illness or inability to take PO  Dispo: The patient is from: Home              Anticipated d/c is to: Home              Anticipated d/c date is: 2 days              Patient currently is not medically stable to d/c.  Subjective: No nausea no vomiting.  Reports cough and shortness of breath.  No fever no chills.  Physical Exam:  General: Appear in mild distress, no Rash; Oral Mucosa Clear, moist. no Abnormal Neck Mass Or lumps, Conjunctiva normal  Cardiovascular: S1 and S2 Present, no Murmur, Respiratory: good respiratory effort, Bilateral Air entry present and bilateral  Crackles, no wheezes Abdomen: Bowel Sound present, Soft and no tenderness Extremities: no Pedal edema, no calf tenderness Neurology: alert and oriented to time, place, and person affect appropriate. no new focal deficit Gait not checked due to patient safety concerns  Vitals:   03/02/20 0811 03/02/20 0851 03/02/20 1249 03/02/20 1359  BP: (!) 132/79 (!) 139/71 113/74 125/79  Pulse: 80 82 91 83  Resp: 20 19 19 19   Temp: (!) 97.5 F (36.4 C) 98.1  F (36.7 C) 98.6 F (37 C) 97.9 F (36.6 C)  TempSrc: Oral Oral Oral Oral  SpO2: 98% 98% 98% 99%  Weight:      Height:        Intake/Output Summary (Last 24 hours) at 03/02/2020 1834 Last data filed at 03/02/2020 1700 Gross per 24 hour  Intake 2564.2 ml  Output 1 ml  Net 2563.2 ml   Filed Weights   02/28/20 0207 02/29/20 0500 03/02/20 0455  Weight: (!) 137.6 kg (!) 144.5 kg (!) 139.7 kg    Data Reviewed: I have  personally reviewed and interpreted daily labs, tele strips, imagings as discussed above. I reviewed all nursing notes, pharmacy notes, vitals, pertinent old records I have discussed plan of care as described above with RN and patient/family.  CBC: Recent Labs  Lab 02/27/20 1352 02/28/20 0235 02/29/20 0524 03/01/20 0222 03/02/20 0441  WBC 12.7* 13.0* 14.3* 12.5* 9.4  NEUTROABS 9.2*  --   --   --   --   HGB 14.9 14.0 13.1 12.8 12.4  HCT 48.9* 44.4 38.8 40.0 39.9  MCV 102.9* 97.6 94.9 96.6 100.0  PLT 238 175 141* 162 168   Basic Metabolic Panel: Recent Labs  Lab 02/28/20 0235 02/28/20 0401 02/28/20 1636 02/29/20 0524 03/01/20 0222 03/02/20 0441  NA 142 144  --  141 142 142  K 5.3* 4.9  --  3.7 4.2 3.4*  CL 100 99  --  100 100 99  CO2 26 28  --  32 33* 35*  GLUCOSE 93 97  --  91 83 93  BUN 16 16  --  18 14 13   CREATININE 1.12* 1.01*  --  0.83 0.58 0.59  CALCIUM 9.5 9.6  --  9.0 9.0 8.9  MG 2.1  --  2.0 1.8  --   --   PHOS 4.1  --  2.1* 2.2*  --   --     Studies: No results found.  Scheduled Meds: . chlorhexidine  15 mL Mouth Rinse BID  . Chlorhexidine Gluconate Cloth  6 each Topical Daily  . mouth rinse  15 mL Mouth Rinse q12n4p  . multivitamin with minerals  1 tablet Per Tube Daily  . rivaroxaban  10 mg Oral QHS  . sodium chloride flush  10-40 mL Intracatheter Q12H   Continuous Infusions: . ampicillin-sulbactam (UNASYN) IV 3 g (03/02/20 1450)   PRN Meds: acetaminophen, albuterol, docusate, ondansetron (ZOFRAN) IV, polyethylene glycol, sodium chloride flush  Time spent: 35 minutes  Author: 03/04/20, MD [facility] 03/02/2020 6:34 PM  To reach On-call, see care teams to locate the attending and reach out via [email or web address]. Between 7PM-7AM, please contact night-coverage If you still have difficulty reaching the attending provider, please page the [facility] (Director on Call) for [facility] on amion for assistance.

## 2020-03-02 NOTE — Progress Notes (Signed)
   03/02/20 0050  Assess: MEWS Score  ECG Heart Rate 87  Assess: MEWS Score  MEWS Temp 0  MEWS Systolic 0  MEWS Pulse 0  MEWS RR 2  MEWS LOC 0  MEWS Score 2  MEWS Score Color Yellow  Cpap readjusted. Pt repositioned. Assessed for SOB. Charge RN made aware of data, RT called and notified.

## 2020-03-02 NOTE — Progress Notes (Signed)
   03/02/20 0450  Assess: MEWS Score  Temp 98.2 F (36.8 C)  BP (!) 134/76  Pulse Rate 87  Resp 23  Level of Consciousness Alert  SpO2 100 %  O2 Device CPAP  Assess: MEWS Score  MEWS Temp 0  MEWS Systolic 0  MEWS Pulse 0  MEWS RR 1  MEWS LOC 0  MEWS Score 1  MEWS Score Color Green  Assess: if the MEWS score is Yellow or Red  Were vital signs taken at a resting state? Yes  Focused Assessment No change from prior assessment  Early Detection of Sepsis Score *See Row Information* Low  MEWS guidelines implemented *See Row Information* Yes  Treat  MEWS Interventions Other (Comment) (continue to monitor)  Pain Scale 0-10  Pain Score 0  Document  Patient Outcome Other (Comment) (RR decreased. pt denies sob/ difficulty breathing)  Progress note created (see row info) Yes  Pt continues on cpap. Denies sob. Hob remains elevated. RR improved at this time. Will continue to monitor.

## 2020-03-02 NOTE — Progress Notes (Signed)
   03/02/20 0250  Assess: MEWS Score  Temp 98.6 F (37 C)  BP 119/80  Pulse Rate 96  Resp (!) 28  Level of Consciousness Alert  SpO2 98 %  O2 Device CPAP  Assess: MEWS Score  MEWS Temp 0  MEWS Systolic 0  MEWS Pulse 0  MEWS RR 2  MEWS LOC 0  MEWS Score 2  MEWS Score Color Yellow  Assess: if the MEWS score is Yellow or Red  Were vital signs taken at a resting state? Yes  Focused Assessment No change from prior assessment  Early Detection of Sepsis Score *See Row Information* Low  MEWS guidelines implemented *See Row Information* Yes  Treat  MEWS Interventions Consulted Respiratory Therapy;Other (Comment) (RT to assess patient and settings. repositioned)  Pain Scale 0-10  Pain Score 0  Pt denies SOB. States cpap is more comfortable after setting changes previously. No changes in lung sounds. HOB remains elevated. RT to reassess patient and cpap.

## 2020-03-03 LAB — BASIC METABOLIC PANEL
Anion gap: 10 (ref 5–15)
BUN: 10 mg/dL (ref 8–23)
CO2: 36 mmol/L — ABNORMAL HIGH (ref 22–32)
Calcium: 9.5 mg/dL (ref 8.9–10.3)
Chloride: 98 mmol/L (ref 98–111)
Creatinine, Ser: 0.68 mg/dL (ref 0.44–1.00)
GFR calc Af Amer: >60 mL/min (ref >60)
GFR calc non Af Amer: >60 mL/min (ref >60)
Glucose, Bld: 109 mg/dL — ABNORMAL HIGH (ref 70–99)
Potassium: 4.2 mmol/L (ref 3.5–5.1)
Sodium: 144 mmol/L (ref 135–145)

## 2020-03-03 LAB — CULTURE, BLOOD (ROUTINE X 2)
Culture: NO GROWTH
Culture: NO GROWTH
Special Requests: ADEQUATE
Special Requests: ADEQUATE

## 2020-03-03 LAB — MAGNESIUM: Magnesium: 1.7 mg/dL (ref 1.7–2.4)

## 2020-03-03 MED ORDER — FUROSEMIDE 10 MG/ML IJ SOLN
40.0000 mg | Freq: Once | INTRAMUSCULAR | Status: AC
  Administered 2020-03-03: 40 mg via INTRAVENOUS
  Filled 2020-03-03: qty 4

## 2020-03-03 MED ORDER — FUROSEMIDE 10 MG/ML IJ SOLN
40.0000 mg | Freq: Two times a day (BID) | INTRAMUSCULAR | Status: DC
  Administered 2020-03-03 – 2020-03-04 (×3): 40 mg via INTRAVENOUS
  Filled 2020-03-03 (×3): qty 4

## 2020-03-03 MED ORDER — POTASSIUM CHLORIDE 20 MEQ PO PACK
20.0000 meq | PACK | Freq: Every day | ORAL | Status: DC
  Administered 2020-03-03 – 2020-03-07 (×5): 20 meq via ORAL
  Filled 2020-03-03 (×5): qty 1

## 2020-03-03 NOTE — Progress Notes (Signed)
[facility] Progress Note  Patient: Tina Chase    ELM:[id number]  DOA: 02/27/2020     Date of Service: the patient was seen and examined on 03/03/2020  Brief hospital course: Past medical history of HTN, HLD, OSA.  Presents with complaints of cough and shortness of breath found to have acute pneumonia with acute hypoxic respiratory failure requiring intubation.  Currently extubated and stable on home oxygen. Currently plan is antibiotics add IV Lasix  Assessment and Plan: Acute hypoxic, hypercarbic respiratory failure Bilateral Lower Lobe Consolidation, Possible Aspiration PNA Baseline Severe OSA (AHI 124.5/h) Enlarged Pulmonary Trunk on CT / Suspected Pulmonary Hypertension  Unclear etiology. May have aspirated during procedure or not used her CPAP at night due to weakness post colonoscopy yesterday. Note baseline CO2 on BMP ~30.   -SBT with goal for extubation  -wean PEEP / FiO2 for sats >90% -follow cultures  -intermittent CXR  -will need mandatory CPAP post extubation QHS and PRN daytime sleep  SLP suggest regular diet -consult Care Management to assist with checking home machine / can [employer] go out to assess mask?  She needs to wear her CPAP QHS and PRN daytime sleep  PT recommends home with home health  History of pulmonary embolism x2, on Eliquis No PE noted on admit 7/27 CTA imaging.  Life long anticoagulation given recurrent PE.  -continue eliquis  -follow CBC  -may not need PICC line at this point   Hypertension, hyperlipidemia -hold home antihypertensives  -follow BMP trend  AKI on CKD  -Trend BMP / urinary output -Replace electrolytes as indicated  Morbid obesity Body mass index is 59.15 kg/m.  Nutrition Problem: Inadequate oral intake Etiology: inability to eat Interventions: Interventions: Refer to RD note for recommendations   Acute on chronic diastolic CHF. Continue with IV Lasix. Patient is here IV Lasix x1 in ICU. Echocardiogram report on  710 shows preserved EF with diastolic dysfunction.  Diet: Cardiac diet DVT Prophylaxis:   rivaroxaban (XARELTO) tablet 10 mg Start: 02/29/20 2200 SCDs Start: 02/27/20 1916 rivaroxaban (XARELTO) tablet 10 mg    Advance goals of care discussion: Full code  Family Communication: family was present at bedside, at the time of interview.  The pt provided permission to discuss medical plan with the family. Opportunity was given to ask question and all questions were answered satisfactorily.   Disposition:  Status is: Inpatient  Remains inpatient appropriate because:IV treatments appropriate due to intensity of illness or inability to take PO  Dispo: The patient is from: Home              Anticipated d/c is to: Home              Anticipated d/c date is: 2 days              Patient currently is not medically stable to d/c.  Subjective: Continues to have cough and shortness of breath but no nausea no vomiting.  Physical Exam:  General: Appear in mild distress, no Rash; Oral Mucosa Clear, moist. no Abnormal Neck Mass Or lumps, Conjunctiva normal  Cardiovascular: S1 and S2 Present, no Murmur, Respiratory: good respiratory effort, Bilateral Air entry present and bilateral  Crackles, occasional wheezes Abdomen: Bowel Sound present, Soft and no tenderness Extremities: no Pedal edema, no calf tenderness Neurology: alert and oriented to time, place, and person affect appropriate. no new focal deficit Gait not checked due to patient safety concerns  Vitals:   03/02/20 2148 03/03/20 0447 03/03/20 0500 03/03/20 1514  BP:  128/77  (!) 134/74  Pulse:  90  77  Resp: 18 16  18   Temp:  98 F (36.7 C)  98.2 F (36.8 C)  TempSrc:    Oral  SpO2:  97%  91%  Weight:   (!) 142 kg   Height:        Intake/Output Summary (Last 24 hours) at 03/03/2020 1749 Last data filed at 03/03/2020 1408 Gross per 24 hour  Intake 1200 ml  Output 1000 ml  Net 200 ml   Filed Weights   02/29/20 0500 03/02/20 0455  03/03/20 0500  Weight: (!) 144.5 kg (!) 139.7 kg (!) 142 kg    Data Reviewed: I have personally reviewed and interpreted daily labs, tele strips, imagings as discussed above. I reviewed all nursing notes, pharmacy notes, vitals, pertinent old records I have discussed plan of care as described above with RN and patient/family.  CBC: Recent Labs  Lab 02/27/20 1352 02/28/20 0235 02/29/20 0524 03/01/20 0222 03/02/20 0441  WBC 12.7* 13.0* 14.3* 12.5* 9.4  NEUTROABS 9.2*  --   --   --   --   HGB 14.9 14.0 13.1 12.8 12.4  HCT 48.9* 44.4 38.8 40.0 39.9  MCV 102.9* 97.6 94.9 96.6 100.0  PLT 238 175 141* 162 168   Basic Metabolic Panel: Recent Labs  Lab 02/28/20 0235 02/28/20 0401 02/28/20 1636 02/29/20 0524 03/01/20 0222 03/02/20 0441  NA 142 144  --  141 142 142  K 5.3* 4.9  --  3.7 4.2 3.4*  CL 100 99  --  100 100 99  CO2 26 28  --  32 33* 35*  GLUCOSE 93 97  --  91 83 93  BUN 16 16  --  18 14 13   CREATININE 1.12* 1.01*  --  0.83 0.58 0.59  CALCIUM 9.5 9.6  --  9.0 9.0 8.9  MG 2.1  --  2.0 1.8  --   --   PHOS 4.1  --  2.1* 2.2*  --   --     Studies: No results found.  Scheduled Meds: . chlorhexidine  15 mL Mouth Rinse BID  . Chlorhexidine Gluconate Cloth  6 each Topical Daily  . furosemide  40 mg Intravenous BID  . mouth rinse  15 mL Mouth Rinse q12n4p  . multivitamin with minerals  1 tablet Per Tube Daily  . potassium chloride  20 mEq Oral Daily  . rivaroxaban  10 mg Oral QHS  . sodium chloride flush  10-40 mL Intracatheter Q12H   Continuous Infusions: . ampicillin-sulbactam (UNASYN) IV 3 g (03/03/20 1338)   PRN Meds: acetaminophen, albuterol, docusate, ondansetron (ZOFRAN) IV, polyethylene glycol, sodium chloride flush  Time spent: 35 minutes  Author: , MD [facility] 03/03/2020 5:49 PM  To reach On-call, see care teams to locate the attending and reach out via [email or web address]. Between 7PM-7AM, please contact night-coverage If you still  have difficulty reaching the attending provider, please page the [facility] (Director on Call) for [facility] on amion for assistance.

## 2020-03-04 LAB — BLOOD GAS, ARTERIAL
Acid-Base Excess: 12.6 mmol/L — ABNORMAL HIGH (ref 0.0–2.0)
Bicarbonate: 42.2 mmol/L — ABNORMAL HIGH (ref 20.0–28.0)
Drawn by: 25788
O2 Saturation: 94.5 %
Patient temperature: 98.6
pCO2 arterial: 84.1 mmHg (ref 32.0–48.0)
pH, Arterial: 7.321 — ABNORMAL LOW (ref 7.350–7.450)
pO2, Arterial: 89.3 mmHg (ref 83.0–108.0)

## 2020-03-04 LAB — BASIC METABOLIC PANEL
Anion gap: 8 (ref 5–15)
Anion gap: 7 (ref 5–15)
BUN: 11 mg/dL (ref 8–23)
BUN: 10 mg/dL (ref 8–23)
CO2: 37 mmol/L — ABNORMAL HIGH (ref 22–32)
CO2: 37 mmol/L — ABNORMAL HIGH (ref 22–32)
Calcium: 9.3 mg/dL (ref 8.9–10.3)
Calcium: 8.9 mg/dL (ref 8.9–10.3)
Chloride: 99 mmol/L (ref 98–111)
Chloride: 98 mmol/L (ref 98–111)
Creatinine, Ser: 0.6 mg/dL (ref 0.44–1.00)
Creatinine, Ser: 0.6 mg/dL (ref 0.44–1.00)
GFR calc Af Amer: >60 mL/min (ref >60)
GFR calc Af Amer: >60 mL/min (ref >60)
GFR calc non Af Amer: >60 mL/min (ref >60)
GFR calc non Af Amer: >60 mL/min (ref >60)
Glucose, Bld: 90 mg/dL (ref 70–99)
Glucose, Bld: 101 mg/dL — ABNORMAL HIGH (ref 70–99)
Potassium: 3.7 mmol/L (ref 3.5–5.1)
Potassium: 4 mmol/L (ref 3.5–5.1)
Sodium: 144 mmol/L (ref 135–145)
Sodium: 142 mmol/L (ref 135–145)

## 2020-03-04 LAB — MAGNESIUM
Magnesium: 1.7 mg/dL (ref 1.7–2.4)
Magnesium: 1.6 mg/dL — ABNORMAL LOW (ref 1.7–2.4)

## 2020-03-04 MED ORDER — MAGNESIUM SULFATE 2 GM/50ML IV SOLN
2.0000 g | Freq: Once | INTRAVENOUS | Status: AC
  Administered 2020-03-04: 2 g via INTRAVENOUS
  Filled 2020-03-04: qty 50

## 2020-03-04 MED ORDER — SPIRONOLACTONE 25 MG PO TABS: 25.0000 mg | ORAL_TABLET | Freq: Two times a day (BID) | ORAL | Status: DC

## 2020-03-04 MED ORDER — FUROSEMIDE 40 MG PO TABS
80.0000 mg | ORAL_TABLET | Freq: Every day | ORAL | Status: DC
  Administered 2020-03-05 – 2020-03-06 (×2): 80 mg via ORAL
  Filled 2020-03-04 (×4): qty 2

## 2020-03-04 MED ORDER — SPIRONOLACTONE 25 MG PO TABS
25.0000 mg | ORAL_TABLET | Freq: Every day | ORAL | Status: DC
  Administered 2020-03-05 – 2020-03-06 (×2): 25 mg via ORAL
  Filled 2020-03-04 (×3): qty 1

## 2020-03-04 MED ORDER — AMOXICILLIN-POT CLAVULANATE 875-125 MG PO TABS
1.0000 | ORAL_TABLET | Freq: Two times a day (BID) | ORAL | Status: DC
  Administered 2020-03-04 (×2): 1 via ORAL
  Filled 2020-03-04 (×2): qty 1

## 2020-03-04 NOTE — Progress Notes (Signed)
Physical Therapy Treatment Patient Details Name: Tina Chase MRN: [id number] DOB: [birth year] Today's Date: 03/04/2020    History of Present Illness 79 year old with history of PE on Xarelto, hypertension, hyperlipidemia, obesity, sleep apnea, Admitted with hypoxic, hypercarbic respiratory failure, Underwent colonoscopy 7/26 and had been feeling weak with desats. Noted to be confused at home 7/27, Intubated in the ED with altered mental status. has a past medical history of Arthritis, Asthma, Chronic kidney disease, Diverticulosis, Hyperlipidemia, Hyperparathyroidism, Hypertension, IDA (iron deficiency anemia), Kidney atrophy, Myocardial infarction (HCC), Obesity, Pica in adults, Pulmonary embolism (HCC), Pulmonary nodule, Sleep apnea, and Small bowel obstruction (HCC).  Intubated 7/27-7/29 at 1130 am.  CT chest 7/27 Bilateral lower lobe consolidation with rounded right upper lobeconsolidation consistent with multifocal infiltrate    PT Comments    Pt in bed on 3 lts nasal.  Assisted OOB to amb to and from bathroom only due to effort and fatigue.    Follow Up Recommendations  Home health PT;Supervision - Intermittent     Equipment Recommendations  None recommended by PT    Recommendations for Other Services       Precautions / Restrictions Precautions Precautions: Fall    Mobility  Bed Mobility Overal bed mobility: Needs Assistance Bed Mobility: Supine to Sit;Sit to Supine     Supine to sit: Mod assist Sit to supine: Mod assist;Max assist   General bed mobility comments: Assist B LE (L>R) off and onto bed due to "heavyness"  Transfers Overall transfer level: Needs assistance Equipment used: Rolling walker (2 wheeled) Transfers: Sit to/from [employer] Sit to Stand: Min guard Stand pivot transfers: Min guard       General transfer comment: increased time and using forward momentum/forward weight shift to rise  Ambulation/Gait Ambulation/Gait  assistance: Supervision;Min guard Gait Distance (Feet): 22 Feet (11 feet to and from bathroom) Assistive device: Rolling walker (2 wheeled) Gait Pattern/deviations: Step-through pattern;Decreased stance time - left;Wide base of support;Trunk flexed Gait velocity: decr   General Gait Details: was only able to amb to and from bathroom due to fatigue/effort   Remained on 3 lts nasal for 96%   Stairs             Wheelchair Mobility    Modified Rankin (Stroke Patients Only)       Balance                                            Cognition Arousal/Alertness: Awake/alert Behavior During Therapy: WFL for tasks assessed/performed Overall Cognitive Status: Within Functional Limits for tasks assessed                                 General Comments: AxO x 3 pleasant      Exercises      General Comments        Pertinent Vitals/Pain Pain Assessment: Faces Faces Pain Scale: Hurts a little bit Pain Location: B knees and L hip Pain Descriptors / Indicators: Aching Pain Intervention(s): Monitored during session    Home Living                      Prior Function            PT Goals (current goals can now be found in the care plan section) Progress towards  PT goals: Progressing toward goals    Frequency    Min 3X/week      PT Plan Current plan remains appropriate    Co-evaluation              AM-PAC PT "6 Clicks" Mobility   Outcome Measure  Help needed turning from your back to your side while in a flat bed without using bedrails?: A Little Help needed moving from lying on your back to sitting on the side of a flat bed without using bedrails?: A Little Help needed moving to and from a bed to a chair (including a wheelchair)?: A Little Help needed standing up from a chair using your arms (e.g., wheelchair or bedside chair)?: A Little Help needed to walk in hospital room?: A Lot Help needed climbing 3-5 steps  with a railing? : Total 6 Click Score: 15    End of Session Equipment Utilized During Treatment: Gait belt Activity Tolerance: Patient tolerated treatment well Patient left: in bed;with call bell/phone within reach;with nursing/sitter in room Nurse Communication: Mobility status (reported to NT pt had a BM) PT Visit Diagnosis: Unsteadiness on feet (R26.81);Difficulty in walking, not elsewhere classified (R26.2)     Time: 1400-1430 PT Time Calculation (min) (ACUTE ONLY): 30 min  Charges:  $Gait Training: 8-22 mins $Therapeutic Activity: 8-22 mins                     Felecia Shelling  PTA Acute  Rehabilitation Services Pager      [phone] Office      [phone]

## 2020-03-04 NOTE — Progress Notes (Signed)
Patient is in the yellow mews. This LPN presented to patient room to give 1800 IV lasix. Patient was difficult to arouse and when asked if she can tell me her name and where she was at she wasn't able to answer. Called [facility] department director into patient room. Chasity assessed patient and paged Dr. Allena Katz. Dr. Allena Katz sent me a secure chat stating to get blood gas labs on patient. Will continue to monitor.   VS  T 98.8 BP 146/68 MAP 97 RR 30 O2 99% - [place] 3 L

## 2020-03-04 NOTE — Care Management Important Message (Signed)
Important Message  Patient Details IM Letter given to the Patient Name: Tina Chase MRN: [id number] Date of Birth: 08-07-1940   Medicare Important Message Given:  Yes     [name] 03/04/2020, 1:03 PM

## 2020-03-04 NOTE — Progress Notes (Signed)
Nutrition Follow-up  DOCUMENTATION CODES:   Morbid obesity  INTERVENTION:   -Will monitor for nutritional needs  NUTRITION DIAGNOSIS:   Inadequate oral intake related to inability to eat as evidenced by NPO status.  Extubated. Now on heart healthy diet.  GOAL:   Patient will meet greater than or equal to 90% of their needs  Progressing.  MONITOR:   PO intake, Labs, Weight trends, I & O's  ASSESSMENT:   79 year old female with medical hx of PE on Xarelto, HTN, hyperlipidemia, obesity, and sleep apnea. She underwent colonoscopy on 7/26 and since that time was feeling weak and was noted to be confused at home. She was intubated in the ED d/t AMS and hypoxic, hypercarbic respiratory failure.  7/27: intubated 7/29: extubated  Patient now on heart healthy diet. Pt consuming 100% of meals. Supplements not warranted at this time.  Admission weight: 303 lbs. Current weight: 307 lbs.   Labs reviewed. Medications: IV Lasix, MVI, KLOR-CON  Diet Order:   Diet Order            Diet Heart Room service appropriate? Yes; Fluid consistency: Thin  Diet effective now                 EDUCATION NEEDS:   No education needs have been identified at this time  Skin:  Skin Assessment: Reviewed RN Assessment  Last BM:  8/2 -type 5  Height:   Ht Readings from Last 1 Encounters:  02/28/20 5\' 1"  (1.549 m)    Weight:   Wt Readings from Last 1 Encounters:  03/04/20 (!) 139.7 kg   BMI:  Body mass index is 58.19 kg/m.  Estimated Nutritional Needs:   Kcal:  1800-2000  Protein:  95-105g  Fluid:  2L/day  05/04/20, MS, RD, LDN Inpatient Clinical Dietitian Contact information available via Amion

## 2020-03-04 NOTE — Progress Notes (Addendum)
CRITICAL VALUE STICKER  CRITICAL VALUE: PCO2 84.1  RECEIVER (on-site recipient of call): Britta Mccreedy [place]   DATE & TIME NOTIFIED: 03/04/2020 1834  MESSENGER (representative from lab): Vernona Rieger   MD NOTIFIED: Dr. Allena Katz by [facility] page   TIME OF NOTIFICATION: [phone]  RESPONSE: discontinued IV lasix and started Spironolactone / continue to monitor / see new orders

## 2020-03-04 NOTE — Significant Event (Signed)
[facility] PROGRESS NOTE  Patient: Tina Chase QTM:[id number]   PCP: Tyson Alias, MD DOB: [birth year]   DOA: 02/27/2020   DOS: 03/04/2020    Subjective: I was called to evaluate the patient as the patient was more lethargic and tired. At the time of my evaluation patient was back to baseline did not have any acute complaint.  No nausea no vomiting.  No fever no chills.  She was trying to remember what the nurse was trying to ask her to do earlier.  Objective:  Vitals:   03/04/20 1742 03/04/20 1940  BP: (!) 146/68 (!) 144/65  Pulse: 97 95  Resp: (!) 30 19  Temp: 98.8 F (37.1 C) 98.4 F (36.9 C)  SpO2: 99% 97%    No focal deficit.  Equal strength.  No asterixis. S1-S2 present. Clear to auscultation). Bowel sounds present.  Assessment and plan: Acute metabolic encephalopathy OSA. Patient did not use her CPAP throughout the day. Also her metabolic alkalosis is worsening given diuresis. At present discontinue Lasix. Since ABG despite being on critical site shows evidence of chronic compensated hypercarbia we will use her home CPAP. Oxygen saturation 88-92 only. Do not try to overcorrect her oxygen. Low threshold to transfer to stepdown for BiPAP if mentation worsens again. Discussed with RN as well as Press photographer. Also discussed with respiratory therapist.  Author: Lynden Oxford, MD [facility] 03/04/2020 7:45 PM   If 7PM-7AM, please contact night-coverage at [email or web address]

## 2020-03-04 NOTE — Progress Notes (Signed)
[facility] Progress Note  Patient: Tina Chase    QJJ:[id number]  DOA: 02/27/2020     Date of Service: the patient was seen and examined on 03/04/2020  Brief hospital course: Past medical history of HTN, HLD, OSA.  Presents with complaints of cough and shortness of breath found to have acute pneumonia with acute hypoxic respiratory failure requiring intubation.  Currently extubated and stable on home oxygen. Currently plan is antibiotics add IV Lasix  Assessment and Plan: Acute hypoxic, hypercarbic respiratory failure Bilateral Lower Lobe Consolidation, Possible Aspiration PNA Unclear etiology. May have aspirated during procedure or not used her CPAP at night due to weakness post colonoscopy prior to admission. Note baseline CO2 on BMP ~30.   So far cultures are negative. will need mandatory CPAP QHS and PRN daytime sleep  SLP suggest regular diet consult Care Management to assist with checking home machine / can [employer] go out to assess mask?  She needs to wear her CPAP QHS and PRN daytime sleep  PT recommends home with home health Currently on home oxygen.  Acute on chronic diastolic CHF Continue with IV Lasix. Responding well to diuresis. Monitor daily BMP for metabolic alkalosis.  Baseline Severe OSA (AHI 124.5/h) Continue CPAP.  Enlarged Pulmonary Trunk on CT / Suspected Pulmonary Hypertension  Outpatient follow-up with pulmonary.  History of pulmonary embolism x2, on Eliquis No PE noted on admit 7/27 CTA imaging. Life long anticoagulation given recurrent PE.  continue eliquis   Hypertension, hyperlipidemia -hold home antihypertensives  -follow BMP trend  AKI, no CKD. Renal function significantly better. Monitor. Currently responding to IV diuresis.  Morbid obesity Body mass index is 58.19 kg/m.  Nutrition Problem: Inadequate oral intake Etiology: inability to eat Interventions: Interventions: Refer to RD note for recommendations   Diet: Cardiac  diet DVT Prophylaxis:   rivaroxaban (XARELTO) tablet 10 mg Start: 02/29/20 2200 SCDs Start: 02/27/20 1916 rivaroxaban (XARELTO) tablet 10 mg    Advance goals of care discussion: Full code  Family Communication: no family was present at bedside, at the time of interview.   Disposition:  Status is: Inpatient  Remains inpatient appropriate because: Continue IV therapy due to volume overload.  Patient is home alone.  Unsafe discharge.  Dispo: The patient is from: Home              Anticipated d/c is to: Home              Anticipated d/c date is: 03/05/2020              Patient currently is not medically stable to d/c.  Subjective: Reports short of breath.  Fatigue and tiredness. No chest pain abdominal pain.  Physical Exam:  General: alert and oriented to time, place, and person. Appear in mild distress, affect flat in affect Eyes: Conjunctiva normal ENT: Oral Mucosa Clear, moist  Neck: difficult to assess  JVD Cardiovascular: S1 and S2 Present, no Murmur Respiratory: increased respiratory effort, Bilateral Air entry equal and Decreased, no signs of accessory muscle use, bilateral  Crackles, no wheezes Abdomen: Bowel Sound present, Soft and no tenderness Skin: no rashes  Extremities: bilateral Pedal edema Neurologic: without any new focal findings Gait not checked due to patient safety concerns   Vitals:   03/03/20 0500 03/03/20 1514 03/03/20 2107 03/04/20 0520  BP:  (!) 134/74 (!) 144/80 (!) 136/69  Pulse:  77 85 87  Resp:  18 17 20   Temp:  98.2 F (36.8 C) 97.8 F (36.6 C)  98 F (36.7 C)  TempSrc:  Oral Oral Oral  SpO2:  91% 97% 100%  Weight: (!) 142 kg   (!) 139.7 kg  Height:        Intake/Output Summary (Last 24 hours) at 03/04/2020 1319 Last data filed at 03/04/2020 0522 Gross per 24 hour  Intake 480 ml  Output 1450 ml  Net -970 ml   Filed Weights   03/02/20 0455 03/03/20 0500 03/04/20 0520  Weight: (!) 139.7 kg (!) 142 kg (!) 139.7 kg    Data Reviewed: I  have personally reviewed and interpreted daily labs, tele strips, imagings as discussed above. I reviewed all nursing notes, pharmacy notes, vitals, pertinent old records I have discussed plan of care as described above with RN and patient/family.  CBC: Recent Labs  Lab 02/27/20 1352 02/28/20 0235 02/29/20 0524 03/01/20 0222 03/02/20 0441  WBC 12.7* 13.0* 14.3* 12.5* 9.4  NEUTROABS 9.2*  --   --   --   --   HGB 14.9 14.0 13.1 12.8 12.4  HCT 48.9* 44.4 38.8 40.0 39.9  MCV 102.9* 97.6 94.9 96.6 100.0  PLT 238 175 141* 162 168   Basic Metabolic Panel: Recent Labs  Lab 02/28/20 0235 02/28/20 0401 02/28/20 1636 02/29/20 0524 03/01/20 0222 03/02/20 0441 03/03/20 1718 03/04/20 0405  NA 142   < >  --  141 142 142 144 144  K 5.3*   < >  --  3.7 4.2 3.4* 4.2 3.7  CL 100   < >  --  100 100 99 98 99  CO2 26   < >  --  32 33* 35* 36* 37*  GLUCOSE 93   < >  --  91 83 93 109* 90  BUN 16   < >  --  18 14 13 10 11   CREATININE 1.12*   < >  --  0.83 0.58 0.59 0.68 0.60  CALCIUM 9.5   < >  --  9.0 9.0 8.9 9.5 9.3  MG 2.1  --  2.0 1.8  --   --  1.7 1.7  PHOS 4.1  --  2.1* 2.2*  --   --   --   --    < > = values in this interval not displayed.    Studies: No results found.  Scheduled Meds: . amoxicillin-clavulanate  1 tablet Oral Q12H  . chlorhexidine  15 mL Mouth Rinse BID  . Chlorhexidine Gluconate Cloth  6 each Topical Daily  . furosemide  40 mg Intravenous BID  . mouth rinse  15 mL Mouth Rinse q12n4p  . multivitamin with minerals  1 tablet Per Tube Daily  . potassium chloride  20 mEq Oral Daily  . rivaroxaban  10 mg Oral QHS  . sodium chloride flush  10-40 mL Intracatheter Q12H   Continuous Infusions:  PRN Meds: acetaminophen, albuterol, docusate, ondansetron (ZOFRAN) IV, polyethylene glycol, sodium chloride flush  Time spent: 35 minutes  Author: , MD [facility] 03/04/2020 1:19 PM  To reach On-call, see care teams to locate the attending and reach out  via [email or web address]. Between 7PM-7AM, please contact night-coverage If you still have difficulty reaching the attending provider, please page the [facility] (Director on Call) for [facility] on amion for assistance.

## 2020-03-05 LAB — BASIC METABOLIC PANEL
Anion gap: 10 (ref 5–15)
BUN: 11 mg/dL (ref 8–23)
CO2: 39 mmol/L — ABNORMAL HIGH (ref 22–32)
Calcium: 9.5 mg/dL (ref 8.9–10.3)
Chloride: 95 mmol/L — ABNORMAL LOW (ref 98–111)
Creatinine, Ser: 0.62 mg/dL (ref 0.44–1.00)
GFR calc Af Amer: >60 mL/min (ref >60)
GFR calc non Af Amer: >60 mL/min (ref >60)
Glucose, Bld: 90 mg/dL (ref 70–99)
Potassium: 3.8 mmol/L (ref 3.5–5.1)
Sodium: 144 mmol/L (ref 135–145)

## 2020-03-05 LAB — MAGNESIUM: Magnesium: 2.1 mg/dL (ref 1.7–2.4)

## 2020-03-05 NOTE — Progress Notes (Signed)
Occupational Therapy Treatment Patient Details Name: Tina Chase MRN: [id number] DOB: Apr 27, 1941 Today's Date: 03/05/2020    History of present illness 79 year old with history of PE on Xarelto, hypertension, hyperlipidemia, obesity, sleep apnea, Admitted with hypoxic, hypercarbic respiratory failure, Underwent colonoscopy 7/26 and had been feeling weak with desats. Noted to be confused at home 7/27, Intubated in the ED with altered mental status. has a past medical history of Arthritis, Asthma, Chronic kidney disease, Diverticulosis, Hyperlipidemia, Hyperparathyroidism, Hypertension, IDA (iron deficiency anemia), Kidney atrophy, Myocardial infarction (HCC), Obesity, Pica in adults, Pulmonary embolism (HCC), Pulmonary nodule, Sleep apnea, and Small bowel obstruction (HCC).  Intubated 7/27-7/29 at 1130 am.  CT chest 7/27 Bilateral lower lobe consolidation with rounded right upper lobeconsolidation consistent with multifocal infiltrate   OT comments  Patient progressing slowly but steadly and showed improved activity tolerance, maintaining SpO2 at 99% on 2L during and after activity, compared to previous session. Patient limited by debility and limited endurance needed for higher level ADLs, along with deficits noted below. Tina Chase continues to demonstrate good rehab potential and would benefit from continued skilled OT to increase safety and independence with ADLs and functional transfers to allow Tina Chase to return home safely and reduce caregiver burden and fall risk.   Follow Up Recommendations  SNF;Other (comment) (Tina Chase now considering SNF for rehab prior to home. Tina Chase reports plan to speak with family to better determine support available at d/c. If support is lacking, Tina Chase reports that she is leaning towards SNF.  HH OT recommended if Tina Chase does not qualify for SNF.)    Equipment Recommendations  None recommended by OT    Recommendations for Other Services      Precautions / Restrictions  Precautions Precautions: Fall Precaution Comments: monitor VS Restrictions Weight Bearing Restrictions: No       Mobility Bed Mobility  NT-Tina Chase up in chair.                Transfers Overall transfer level: Needs assistance Equipment used: Rolling walker (2 wheeled) Transfers: Sit to/from [employer] Sit to Stand: Min guard (Verbal cues as Tina Chase hesitates where to place hands) Stand pivot transfers: Min guard       General transfer comment: In-room ambulation with RW and contact guard assist.    Balance     Sitting balance-Leahy Scale: Good     Standing balance support: Single extremity supported;Bilateral upper extremity supported Standing balance-Leahy Scale: Fair Standing balance comment: RW used to assess standing balance                           ADL either performed or assessed with clinical judgement   ADL       Grooming: Standing;Min guard;Wash/dry hands Grooming Details (indicate cue type and reason): Tina Chase cued how to safely position RW anterior to sink. Tina Chase Stood at sink. Cues to reach for soap, and Tina Chase used contralateral UE forearm support on sink for balance with ability to reach forward without LOB.                 Toilet Transfer: BSC;Regular Toilet;Cueing for safety;Min Pension scheme manager Details (indicate cue type and reason): Tina Chase descended to [employer] angled and placed on toilet. Tina Chase found BSC too narrow, stood up with verbal/visual cue for hand on wall mounted grab bar and CGA. Descended to standard toilet with CGA and stood with CGA x 2 for hygiene. Toileting- Clothing Manipulation and Hygiene: Moderate assistance Toileting - Clothing  Manipulation Details (indicate cue type and reason): Assistance clothing management. Patient able to wipe but not with typical quality. limiting by IV in left hand and sat proble on right. Therapist provided moderate assist with hygiene as Tina Chase appears to be partially constipated.  Tina Chase's RN  notified. .     Functional mobility during ADLs: Min guard;Cueing for safety;Rolling walker General ADL Comments: Cotnact guard assist of 1 person with RW, grab bars.     Vision   Vision Assessment?: No apparent visual deficits   Perception     Praxis      Cognition Arousal/Alertness: Awake/alert Behavior During Therapy: WFL for tasks assessed/performed Overall Cognitive Status: Within Functional Limits for tasks assessed                                 General Comments: AxO x 3 pleasant, easily distracted by phone.                    General Comments      Pertinent Vitals/ Pain       Pain Assessment: No/denies pain                                                          Frequency  Min 2X/week        Progress Toward Goals  OT Goals(current goals can now be found in the care plan section)  Progress towards OT goals: Progressing toward goals  Acute Rehab OT Goals Patient Stated Goal: Tina Chase undecided on discharge plans. Goal for today to ambulate without falling. OT Goal Formulation: With patient Time For Goal Achievement: 03/15/20 Potential to Achieve Goals: Good  Plan Other (comment);Discharge plan needs to be updated    Co-evaluation                 AM-PAC OT "6 Clicks" Daily Activity     Outcome Measure   Help from another person eating meals?: None Help from another person taking care of personal grooming?: A Little Help from another person toileting, which includes using toliet, bedpan, or urinal?: A Lot Help from another person bathing (including washing, rinsing, drying)?: A Little Help from another person to put on and taking off regular upper body clothing?: A Little Help from another person to put on and taking off regular lower body clothing?: A Lot 6 Click Score: 17    End of Session Equipment Utilized During Treatment: Gait belt;Rolling walker  OT Visit Diagnosis: Unsteadiness on feet  (R26.81);Muscle weakness (generalized) (M62.81)   Activity Tolerance Patient tolerated treatment well   Patient Left in bed;with call bell/phone within reach;with nursing/sitter in room   Nurse Communication Mobility status;Other (comment) (Tina Chase request pure wick be replaced)        Time: 4709-6283 OT Time Calculation (min): 60 min  Charges: OT General Charges $OT Visit: 1 Visit OT Treatments $Self Care/Home Management : 23-37 mins $Therapeutic Activity: 23-37 mins  Victorino Dike, OT Acute Rehab Services Office: [phone] 03/05/2020    Theodoro Clock 03/05/2020, 12:17 PM

## 2020-03-05 NOTE — Progress Notes (Signed)
   03/05/20 0125  Assess: MEWS Score  Temp 98.7 F (37.1 C)  BP 133/67  Pulse Rate 97  Resp (!) 30  Level of Consciousness Alert  SpO2 96 %  Assess: MEWS Score  MEWS Temp 0  MEWS Systolic 0  MEWS Pulse 0  MEWS RR 2  MEWS LOC 0  MEWS Score 2  MEWS Score Color Yellow  Assess: if the MEWS score is Yellow or Red  Were vital signs taken at a resting state? Yes  Focused Assessment No change from prior assessment  Early Detection of Sepsis Score *See Row Information* Low  MEWS guidelines implemented *See Row Information* No, previously yellow, continue vital signs every 4 hours (pt on cpap, no complaints, no interventions needed)  Treat  Pain Scale 0-10  Pain Score 0  Escalate  MEWS: Escalate Yellow: discuss with charge nurse/RN and consider discussing with provider and RRT  Notify: Charge Nurse/RN  Name of Charge Nurse/RN Notified Debbie RN  Date Charge Nurse/RN Notified 03/05/20  Time Charge Nurse/RN Notified 0130  Document  Patient Outcome Other (Comment) (remains stable)  Progress note created (see row info) Yes

## 2020-03-05 NOTE — Progress Notes (Signed)
[facility] Progress Note  Patient: Tina Chase    OVF:[id number]  DOA: 02/27/2020     Date of Service: the patient was seen and examined on 03/05/2020  Brief hospital course: Past medical history of HTN, HLD, OSA.  Presents with complaints of cough and shortness of breath found to have acute pneumonia with acute hypoxic respiratory failure requiring intubation.  Currently extubated and stable on home oxygen. Currently plan is continue to treat respiratory distress and arrange for SNF.  Assessment and Plan: Acute hypoxic, hypercarbic respiratory failure Bilateral Lower Lobe Consolidation, Possible Aspiration PNA Unclear etiology. May have aspirated during procedure or not used her CPAP at night due to weakness post colonoscopy prior to admission. Note baseline CO2 on BMP ~30.   So far cultures are negative. will need mandatory CPAP QHS and PRN daytime sleep  SLP suggest regular diet consult Care Management to assist with checking home machine / can [employer] go out to assess mask?  She needs to wear her CPAP QHS and PRN daytime sleep  PT recommends home with home health Currently on home oxygen.  Acute on chronic diastolic CHF Continue with IV Lasix. Responding well to diuresis. Monitor daily BMP for metabolic alkalosis.  Baseline Severe OSA (AHI 124.5/h) Continue CPAP.  Acute metabolic encephalopathy occurred on 03/04/2020 currently resolved. Patient did not use her CPAP throughout the day on 03/04/2020. Also her metabolic alkalosis is worsening given diuresis. ABG shows evidence of chronic compensated hypercarbia we will use her home CPAP. Oxygen saturation 88-92 only. Do not try to overcorrect her oxygen.  Enlarged Pulmonary Trunk on CT / Suspected Pulmonary Hypertension  Outpatient follow-up with pulmonary.  History of pulmonary embolism x2, on Eliquis No PE noted on admit 7/27 CTA imaging. Life long anticoagulation given recurrent PE.  continue eliquis    Hypertension, hyperlipidemia -hold home antihypertensives  -follow BMP trend  AKI, no CKD. Respiratory and contraction alkalosis Renal function significantly better. Monitor. Continue oral diuresis. Continue Aldactone.  Morbid obesity Body mass index is 58.19 kg/m.  Nutrition Problem: Inadequate oral intake Etiology: inability to eat Interventions: Interventions: Refer to RD note for recommendations   Diet: Cardiac diet DVT Prophylaxis:   rivaroxaban (XARELTO) tablet 10 mg Start: 02/29/20 2200 SCDs Start: 02/27/20 1916 rivaroxaban (XARELTO) tablet 10 mg    Advance goals of care discussion: Full code  Family Communication: no family was present at bedside, at the time of interview.   Disposition:  Status is: Inpatient  Remains inpatient appropriate because: Continue therapy due to volume overload.  Patient is home alone.  Unsafe discharge.  Dispo: The patient is from: Home              Anticipated d/c is to: SNF              Anticipated d/c date is: 03/06/2020              Patient currently is not medically stable to d/c.  Subjective: Continues to have shortness of breath and fatigue and tiredness but no confusion.  No nausea no vomiting.  Slept okay last night.  Compliant with CPAP for now.  Physical Exam:  General: alert and oriented to time, place, and person. Appear in mild distress, affect flat in affect Eyes: Conjunctiva normal ENT: Oral Mucosa Clear, moist  Neck: difficult to assess  JVD Cardiovascular: S1 and S2 Present, no Murmur Respiratory: increased respiratory effort, Bilateral Air entry equal and Decreased, no signs of accessory muscle use, bilateral  Crackles, no  wheezes Abdomen: Bowel Sound present, Soft and no tenderness Skin: no rashes  Extremities: bilateral Pedal edema Neurologic: without any new focal findings Gait not checked due to patient safety concerns   Vitals:   03/05/20 0544 03/05/20 0939 03/05/20 1205 03/05/20 1332  BP: 126/63  (!) 141/63  (!) 147/82  Pulse: 84 83 86 88  Resp: (!) 26 19  19   Temp: 98.1 F (36.7 C) 98.2 F (36.8 C)  98 F (36.7 C)  TempSrc:  Oral  Oral  SpO2: 95% 100% 99% 100%  Weight:      Height:        Intake/Output Summary (Last 24 hours) at 03/05/2020 1739 Last data filed at 03/05/2020 1650 Gross per 24 hour  Intake 0 ml  Output 2250 ml  Net -2250 ml   Filed Weights   03/02/20 0455 03/03/20 0500 03/04/20 0520  Weight: (!) 139.7 kg (!) 142 kg (!) 139.7 kg    Data Reviewed: I have personally reviewed and interpreted daily labs, tele strips, imagings as discussed above. I reviewed all nursing notes, pharmacy notes, vitals, pertinent old records I have discussed plan of care as described above with RN and patient/family.  CBC: Recent Labs  Lab 02/28/20 0235 02/29/20 0524 03/01/20 0222 03/02/20 0441  WBC 13.0* 14.3* 12.5* 9.4  HGB 14.0 13.1 12.8 12.4  HCT 44.4 38.8 40.0 39.9  MCV 97.6 94.9 96.6 100.0  PLT 175 141* 162 168   Basic Metabolic Panel: Recent Labs  Lab 02/28/20 0235 02/28/20 0401 02/28/20 1636 02/29/20 0524 03/01/20 0222 03/02/20 0441 03/03/20 1718 03/04/20 0405 03/04/20 1654 03/05/20 0342  NA 142   < >  --  141   < > 142 144 144 142 144  K 5.3*   < >  --  3.7   < > 3.4* 4.2 3.7 4.0 3.8  CL 100   < >  --  100   < > 99 98 99 98 95*  CO2 26   < >  --  32   < > 35* 36* 37* 37* 39*  GLUCOSE 93   < >  --  91   < > 93 109* 90 101* 90  BUN 16   < >  --  18   < > 13 10 11 10 11   CREATININE 1.12*   < >  --  0.83   < > 0.59 0.68 0.60 0.60 0.62  CALCIUM 9.5   < >  --  9.0   < > 8.9 9.5 9.3 8.9 9.5  MG 2.1   < > 2.0 1.8  --   --  1.7 1.7 1.6* 2.1  PHOS 4.1  --  2.1* 2.2*  --   --   --   --   --   --    < > = values in this interval not displayed.    Studies: No results found.  Scheduled Meds: . chlorhexidine  15 mL Mouth Rinse BID  . Chlorhexidine Gluconate Cloth  6 each Topical Daily  . furosemide  80 mg Oral Daily  . mouth rinse  15 mL Mouth Rinse  q12n4p  . multivitamin with minerals  1 tablet Per Tube Daily  . potassium chloride  20 mEq Oral Daily  . rivaroxaban  10 mg Oral QHS  . sodium chloride flush  10-40 mL Intracatheter Q12H  . spironolactone  25 mg Oral Daily   Continuous Infusions:  PRN Meds: acetaminophen, albuterol, ondansetron (ZOFRAN) IV, polyethylene glycol,  sodium chloride flush  Time spent: 35 minutes  Author: Lynden Oxford, MD [facility] 03/05/2020 5:39 PM  To reach On-call, see care teams to locate the attending and reach out via [email or web address]. Between 7PM-7AM, please contact night-coverage If you still have difficulty reaching the attending provider, please page the [facility] (Director on Call) for [facility] on amion for assistance.

## 2020-03-05 NOTE — Progress Notes (Signed)
Physical Therapy Treatment Patient Details Name: Tina Chase MRN: [id number] DOB: [birth year] Today's Date: 03/05/2020    History of Present Illness 79 year old with history of PE on Xarelto, hypertension, hyperlipidemia, obesity, sleep apnea, Admitted with hypoxic, hypercarbic respiratory failure, Underwent colonoscopy 7/26 and had been feeling weak with desats. Noted to be confused at home 7/27, Intubated in the ED with altered mental status. has a past medical history of Arthritis, Asthma, Chronic kidney disease, Diverticulosis, Hyperlipidemia, Hyperparathyroidism, Hypertension, IDA (iron deficiency anemia), Kidney atrophy, Myocardial infarction (HCC), Obesity, Pica in adults, Pulmonary embolism (HCC), Pulmonary nodule, Sleep apnea, and Small bowel obstruction (HCC).  Intubated 7/27-7/29 at 1130 am.  CT chest 7/27 Bilateral lower lobe consolidation with rounded right upper lobeconsolidation consistent with multifocal infiltrate    PT Comments    Pt was OOB in recliner.  Assisted with amb.  General Gait Details: tolerated an increased distance.  Amb to and from bathroom as well as in hallway on RA sats decreased to 85% during activity but good recover with rest to low 90's.  SATURATION QUALIFICATIONS: (This note is used to comply with regulatory documentation for home oxygen)  Patient Saturations on Room Air at Rest = 92%  Patient Saturations on Room Air while Ambulating 55 feet= 83%  Patient Saturations on 2 Liters of oxygen while Ambulating = 90%  Please briefly explain why patient needs home oxygen:  Pt required supplemental oxygen during activity to achieve therapeutic level  Pt plans to D/C back home with family support   Follow Up Recommendations  Home health PT;Supervision/Assistance - 24 hour     Equipment Recommendations  None recommended by PT    Recommendations for Other Services       Precautions / Restrictions Precautions Precautions: Fall Precaution Comments:  monitor VS Restrictions Weight Bearing Restrictions: No    Mobility  Bed Mobility               General bed mobility comments: OOB in recliner  Transfers Overall transfer level: Needs assistance Equipment used: Rolling walker (2 wheeled) Transfers: Sit to/from [employer] Sit to Stand: Supervision Stand pivot transfers: Supervision       General transfer comment: self able with increased time and forward weight shift.  Also self able off regular height commode  Ambulation/Gait Ambulation/Gait assistance: Supervision;Min guard Gait Distance (Feet): 55 Feet Assistive device: Rolling walker (2 wheeled) Gait Pattern/deviations: Step-through pattern;Decreased stance time - left;Wide base of support;Trunk flexed Gait velocity: decreased   General Gait Details: tolerated an increased distance.  Amb to and from bathroom as well as in hallway on RA sats decreased to 85% during activity but good recover with rest to low 90's.   Stairs             Wheelchair Mobility    Modified Rankin (Stroke Patients Only)       Balance                                            Cognition   Behavior During Therapy: WFL for tasks assessed/performed Overall Cognitive Status: Within Functional Limits for tasks assessed                                 General Comments: AxO x 3 pleasant      Exercises  General Comments        Pertinent Vitals/Pain Pain Assessment: Faces Faces Pain Scale: Hurts a little bit Pain Location: bottom Pain Descriptors / Indicators: Aching;Pressure Pain Intervention(s): Monitored during session    Home Living                      Prior Function            PT Goals (current goals can now be found in the care plan section) Progress towards PT goals: Progressing toward goals    Frequency    Min 3X/week      PT Plan Current plan remains appropriate    Co-evaluation               AM-PAC PT "6 Clicks" Mobility   Outcome Measure  Help needed turning from your back to your side while in a flat bed without using bedrails?: A Little Help needed moving from lying on your back to sitting on the side of a flat bed without using bedrails?: A Little Help needed moving to and from a bed to a chair (including a wheelchair)?: A Little Help needed standing up from a chair using your arms (e.g., wheelchair or bedside chair)?: A Little Help needed to walk in hospital room?: A Little Help needed climbing 3-5 steps with a railing? : A Lot 6 Click Score: 17    End of Session Equipment Utilized During Treatment: Gait belt Activity Tolerance: Patient tolerated treatment well Patient left: in chair;with call bell/phone within reach Nurse Communication: Mobility status PT Visit Diagnosis: Unsteadiness on feet (R26.81);Difficulty in walking, not elsewhere classified (R26.2)     Time: 1340-1405 PT Time Calculation (min) (ACUTE ONLY): 25 min  Charges:  $Gait Training: 8-22 mins $Therapeutic Activity: 8-22 mins                     {Alexander Mcauley  PTA Acute  Rehabilitation Services Pager      [phone] Office      [phone]

## 2020-03-05 NOTE — Progress Notes (Signed)
   03/05/20 0544  Charting Type  Charting Type Reassessment  Orders Chart Check (once per shift) Completed  [place] Work Intensity Score (Update with each assessment and as needed)  Work Intensity Score (Level) 2  Level 2 Intensity S.Meds scheduled or prns 2-4 hours;C.Needs assistance with ADLs and mobility  Neurological  Neuro (WDL) WDL  Glasgow Coma Scale  Eye Opening 4  Best Motor Response 6  Glasgow Coma Scale Score 15  Best Verbal Response (NON-intubated) 5  HEENT  HEENT (WDL) WDL  Respiratory  Respiratory (WDL) X  Respiratory Pattern Regular;Unlabored;Tachypnea  Chest Assessment Chest expansion symmetrical  Bilateral Breath Sounds Clear;Diminished  Cardiac  Cardiac (WDL) WDL  Pulse Regular  Heart Sounds No adventitious heart sounds  Jugular Venous Distention (JVD) No  ECG Monitor Yes  Neurological  Level of Consciousness Alert

## 2020-03-05 NOTE — TOC Initial Note (Addendum)
Transition of Care [facility]) - Initial/Assessment Note    Patient Details  Name: Tina Chase MRN: [id number] Date of Birth: 08/22/1940  Transition of Care [facility]) CM/SW Contact:    Ida Rogue, LCSW Phone Number: 03/05/2020, 10:01 AM  Clinical Narrative:   Patient seen in follow up to PT recommendation of HH PT. Ms Cleland states she lives alone, has a sister in the area and her daughter is in [place].  She has trepidation about going home as she still feels weak and was not able to walk very far yesterday-just to bathroom and back.  Interested in seeing if insurance will authorize her for SNF for rehab-she has a brother who is currently in facility for same thing. Has all needed DME at home. Will send out bed search, insurance auth request. TOC will continue to follow during the course of hospitalization.  Addendum:  Followed up with patient about name of facility as she wants to make sure we send info. To facility her brother is in.  She stated she spoke with her daughter, who is checking to see if she can get off of work for a couple of weeks to help Ms Soyars at home rather than go to rehab.  Will check back in AM.                 Expected Discharge Plan: Skilled Nursing Facility Barriers to Discharge: Insurance Authorization   Patient Goals and CMS Choice Patient states their goals for this hospitalization and ongoing recovery are:: "I think I need to go to rehab for a couple of weeks"   Choice offered to / list presented to : Patient  Expected Discharge Plan and Services Expected Discharge Plan: Skilled Nursing Facility   Discharge Planning Services: CM Consult Post Acute Care Choice: Skilled Nursing Facility Living arrangements for the past 2 months: Apartment                                      Prior Living Arrangements/Services Living arrangements for the past 2 months: Apartment Lives with:: Self Patient language and need for interpreter reviewed:: Yes         Need for Family Participation in Patient Care: Yes (Comment) Care giver support system in place?: Yes (comment) (daughter)   Criminal Activity/Legal Involvement Pertinent to Current Situation/Hospitalization: No - Comment as needed  Activities of Daily Living Home Assistive Devices/Equipment: Eyeglasses ADL Screening (condition at time of admission) Patient's cognitive ability adequate to safely complete daily activities?: Yes (unresponsive currently but normally is ok) Is the patient deaf or have difficulty hearing?: No Does the patient have difficulty seeing, even when wearing glasses/contacts?: No Does the patient have difficulty concentrating, remembering, or making decisions?: No (unresponsive currently) Patient able to express need for assistance with ADLs?: No Does the patient have difficulty dressing or bathing?: Yes Independently performs ADLs?: No (normally completely independent but unresponsive now) Communication: Dependent Is this a change from baseline?: Change from baseline, expected to last >3 days Dressing (OT): Dependent Is this a change from baseline?: Change from baseline, expected to last >3 days Grooming: Dependent Is this a change from baseline?: Change from baseline, expected to last >3 days Feeding: Dependent Is this a change from baseline?: Change from baseline, expected to last >3 days Bathing: Dependent Is this a change from baseline?: Change from baseline, expected to last >3 days Toileting: Dependent Is this a change  from baseline?: Change from baseline, expected to last >3days In/Out Bed: Dependent Is this a change from baseline?: Change from baseline, expected to last >3 days Walks in Home: Dependent Is this a change from baseline?: Change from baseline, expected to last >3 days Does the patient have difficulty walking or climbing stairs?: Yes Weakness of Legs: Both Weakness of Arms/Hands: Both  Permission Sought/Granted                   Emotional Assessment Appearance:: Appears stated age Attitude/Demeanor/Rapport: Engaged Affect (typically observed): Appropriate Orientation: : Oriented to Self, Oriented to Place, Oriented to Situation Alcohol / Substance Use: Tobacco Use Psych Involvement: No (comment)  Admission diagnosis:  Acute respiratory failure (HCC) [J96.00] Hypokalemia [E87.6] Elevated troponin [R77.8] Elevated brain natriuretic peptide (BNP) level [R79.89] Acute respiratory failure with hypercapnia (HCC) [J96.02] Altered mental status, unspecified altered mental status type [R41.82] Patient Active Problem List   Diagnosis Date Noted  . Acute respiratory failure (HCC) 02/27/2020  . Iron deficiency anemia   . Erosive gastritis   . Abdominal hernia 12/11/2019  . Iron deficiency 11/07/2019  . Hypercoagulable state (HCC)   . Sleep apnea 07/08/2015  . Bilateral lower extremity edema 01/09/2014  . 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 Nephrolithiasis 05/25/2013  . Hypertension 12/22/2011  . Obesity, Class III, BMI 40-49.9 (morbid obesity) (HCC) 12/22/2011  . Asthma 12/04/2011  . Single kidney 09/15/2011  . Routine health maintenance 09/15/2011   PCP:  Tyson Alias, MD Pharmacy:   [facility] [place], [place] - [address] [place] [address] Phone: [phone] Fax: [phone]     Social Determinants of Health (SDOH) Interventions    Readmission Risk Interventions No flowsheet data found.

## 2020-03-05 NOTE — NC FL2 (Signed)
[place] MEDICAID FL2 LEVEL OF CARE SCREENING TOOL     IDENTIFICATION  Patient Name: Tina Chase Birthdate: Apr 27, 1941 Sex: female Admission Date (Current Location): 02/27/2020  [facility] and [place] Number:  Producer, television/film/video and Address:  [facility],  [address] [place], [place] [address]      Provider Number: [id number]  Attending Physician Name and Address:  Rolly Salter, MD  Relative Name and Phone Number:  Albertha Ghee, [phone]    Current Level of Care: Hospital Recommended Level of Care: Skilled Nursing Facility Prior Approval Number:    Date Approved/Denied:   PASRR Number: [id number]  Discharge Plan: SNF    Current Diagnoses: Patient Active Problem List   Diagnosis Date Noted  . Acute respiratory failure (HCC) 02/27/2020  . Iron deficiency anemia   . Erosive gastritis   . Abdominal hernia 12/11/2019  . Iron deficiency 11/07/2019  . Hypercoagulable state (HCC)   . Sleep apnea 07/08/2015  . Bilateral lower extremity edema 01/09/2014  . 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 Nephrolithiasis 05/25/2013  . Hypertension 12/22/2011  . Obesity, Class III, BMI 40-49.9 (morbid obesity) (HCC) 12/22/2011  . Asthma 12/04/2011  . Single kidney 09/15/2011  . Routine health maintenance 09/15/2011    Orientation RESPIRATION BLADDER Height & Weight     Self, Situation, Place  O2 (3L) External catheter Weight: (!) 139.7 kg Height:  5\' 1"  (154.9 cm)  BEHAVIORAL SYMPTOMS/MOOD NEUROLOGICAL BOWEL NUTRITION STATUS   (none)  (none) Continent Diet (see d/c summary)  AMBULATORY STATUS COMMUNICATION OF NEEDS Skin   Extensive Assist Verbally Normal                       Personal Care Assistance Level of Assistance  Bathing, Feeding, Dressing Bathing Assistance: Maximum assistance Feeding assistance: Independent Dressing Assistance: Limited assistance     Functional Limitations Info  Sight, Hearing, Speech Sight Info: Adequate Hearing Info:  Adequate Speech Info: Adequate    SPECIAL CARE FACTORS FREQUENCY  PT (By licensed PT)     PT Frequency: 5X/W              Contractures Contractures Info: Not present    Additional Factors Info  Code Status, Allergies Code Status Info: Full Allergies Info: Hydralazine, Lisinopril, Aspirin, Shellfish           Current Medications (03/05/2020):  This is the current hospital active medication list Current Facility-Administered Medications  Medication Dose Route Frequency Provider Last Rate Last Admin  . acetaminophen (TYLENOL) tablet 650 mg  650 mg Oral Q6H PRN 05/05/2020, MD      . albuterol (PROVENTIL) (2.5 MG/3ML) 0.083% nebulizer solution 2.5 mg  2.5 mg Nebulization Q4H PRN Ollis, Brandi L, NP   2.5 mg at 02/29/20 1451  . chlorhexidine (PERIDEX) 0.12 % solution 15 mL  15 mL Mouth Rinse BID 03/02/20 B, MD   15 mL at 03/05/20 0947  . Chlorhexidine Gluconate Cloth 2 % PADS 6 each  6 each Topical Daily Mannam, Praveen, MD   6 each at 03/04/20 1040  . furosemide (LASIX) tablet 80 mg  80 mg Oral Daily 05/04/20, MD   80 mg at 03/05/20 0947  . MEDLINE mouth rinse  15 mL Mouth Rinse q12n4p 05/05/20 B, MD   15 mL at 03/04/20 1611  . multivitamin with minerals tablet 1 tablet  1 tablet Per Tube Daily 05/04/20, MD   1 tablet at 03/05/20 0946  .  ondansetron (ZOFRAN) injection 4 mg  4 mg Intravenous Q6H PRN Rolly Salter, MD      . polyethylene glycol (MIRALAX / GLYCOLAX) packet 17 g  17 g Per Tube Daily PRN Tiffanny, [place], RPH      . potassium chloride (KLOR-CON) packet 20 mEq  20 mEq Oral Daily Rolly Salter, MD   20 mEq at 03/05/20 0947  . rivaroxaban (XARELTO) tablet 10 mg  10 mg Oral QHS Max Fickle B, MD   10 mg at 03/04/20 2000  . sodium chloride flush (NS) 0.9 % injection 10-40 mL  10-40 mL Intracatheter Q12H Oretha Milch, MD   10 mL at 03/05/20 0952  . sodium chloride flush (NS) 0.9 % injection 10-40 mL  10-40 mL Intracatheter PRN  Oretha Milch, MD      . spironolactone (ALDACTONE) tablet 25 mg  25 mg Oral Daily Rolly Salter, MD   25 mg at 03/05/20 5883     Discharge Medications: Please see discharge summary for a list of discharge medications.  Relevant Imaging Results:  Relevant Lab Results:   Additional Information [address] [place], [place]

## 2020-03-06 ENCOUNTER — Inpatient Hospital Stay (HOSPITAL_COMMUNITY): Payer: Medicare Other

## 2020-03-06 DIAGNOSIS — K2960 Other gastritis without bleeding: Secondary | ICD-10-CM

## 2020-03-06 DIAGNOSIS — I10 Essential (primary) hypertension: Secondary | ICD-10-CM

## 2020-03-06 DIAGNOSIS — I5033 Acute on chronic diastolic (congestive) heart failure: Secondary | ICD-10-CM

## 2020-03-06 DIAGNOSIS — G4733 Obstructive sleep apnea (adult) (pediatric): Secondary | ICD-10-CM

## 2020-03-06 DIAGNOSIS — R600 Localized edema: Secondary | ICD-10-CM

## 2020-03-06 LAB — CBC WITH DIFFERENTIAL/PLATELET
Abs Immature Granulocytes: 0.04 10*3/uL (ref 0.00–0.07)
Basophils Absolute: 0 10*3/uL (ref 0.0–0.1)
Basophils Relative: 0 %
Eosinophils Absolute: 0.4 10*3/uL (ref 0.0–0.5)
Eosinophils Relative: 3 %
HCT: 37.2 % (ref 36.0–46.0)
Hemoglobin: 11.3 g/dL — ABNORMAL LOW (ref 12.0–15.0)
Immature Granulocytes: 0 %
Lymphocytes Relative: 16 %
Lymphs Abs: 1.8 10*3/uL (ref 0.7–4.0)
MCH: 30.4 pg (ref 26.0–34.0)
MCHC: 30.4 g/dL (ref 30.0–36.0)
MCV: 100 fL (ref 80.0–100.0)
Monocytes Absolute: 1.3 10*3/uL — ABNORMAL HIGH (ref 0.1–1.0)
Monocytes Relative: 11 %
Neutro Abs: 7.8 10*3/uL — ABNORMAL HIGH (ref 1.7–7.7)
Neutrophils Relative %: 70 %
Platelets: 180 10*3/uL (ref 150–400)
RBC: 3.72 MIL/uL — ABNORMAL LOW (ref 3.87–5.11)
RDW: 14.9 % (ref 11.5–15.5)
WBC: 11.3 10*3/uL — ABNORMAL HIGH (ref 4.0–10.5)
nRBC: 0 % (ref 0.0–0.2)

## 2020-03-06 LAB — COMPREHENSIVE METABOLIC PANEL
ALT: 15 U/L (ref 0–44)
AST: 18 U/L (ref 15–41)
Albumin: 2.8 g/dL — ABNORMAL LOW (ref 3.5–5.0)
Alkaline Phosphatase: 70 U/L (ref 38–126)
Anion gap: 9 (ref 5–15)
BUN: 12 mg/dL (ref 8–23)
CO2: 36 mmol/L — ABNORMAL HIGH (ref 22–32)
Calcium: 9.5 mg/dL (ref 8.9–10.3)
Chloride: 97 mmol/L — ABNORMAL LOW (ref 98–111)
Creatinine, Ser: 0.6 mg/dL (ref 0.44–1.00)
GFR calc Af Amer: >60 mL/min (ref >60)
GFR calc non Af Amer: >60 mL/min (ref >60)
Glucose, Bld: 94 mg/dL (ref 70–99)
Potassium: 3.6 mmol/L (ref 3.5–5.1)
Sodium: 142 mmol/L (ref 135–145)
Total Bilirubin: 0.6 mg/dL (ref 0.3–1.2)
Total Protein: 5.9 g/dL — ABNORMAL LOW (ref 6.5–8.1)

## 2020-03-06 LAB — ECHOCARDIOGRAM COMPLETE
Area-P 1/2: 4.49 cm2
Height: 61 in
S' Lateral: 2.9 cm
Weight: 4857.17 oz

## 2020-03-06 LAB — MAGNESIUM: Magnesium: 2 mg/dL (ref 1.7–2.4)

## 2020-03-06 MED ORDER — RIVAROXABAN 10 MG PO TABS
10.0000 mg | ORAL_TABLET | Freq: Every day | ORAL | Status: DC
  Administered 2020-03-06: 10 mg via ORAL
  Filled 2020-03-06: qty 1

## 2020-03-06 MED ORDER — GUAIFENESIN ER 600 MG PO TB12
1200.0000 mg | ORAL_TABLET | Freq: Every day | ORAL | Status: DC
  Administered 2020-03-06 – 2020-03-07 (×2): 1200 mg via ORAL
  Filled 2020-03-06 (×2): qty 2

## 2020-03-06 MED ORDER — FUROSEMIDE 10 MG/ML IJ SOLN
40.0000 mg | Freq: Once | INTRAMUSCULAR | Status: AC
  Administered 2020-03-06: 40 mg via INTRAVENOUS
  Filled 2020-03-06: qty 4

## 2020-03-06 MED ORDER — PANTOPRAZOLE SODIUM 40 MG PO TBEC
40.0000 mg | DELAYED_RELEASE_TABLET | Freq: Every day | ORAL | Status: DC
  Administered 2020-03-06 – 2020-03-07 (×2): 40 mg via ORAL
  Filled 2020-03-06 (×2): qty 1

## 2020-03-06 MED ORDER — CARVEDILOL 25 MG PO TABS
25.0000 mg | ORAL_TABLET | Freq: Two times a day (BID) | ORAL | Status: DC
  Administered 2020-03-06 – 2020-03-07 (×2): 25 mg via ORAL
  Filled 2020-03-06 (×2): qty 1

## 2020-03-06 MED ORDER — AMLODIPINE BESYLATE 10 MG PO TABS
10.0000 mg | ORAL_TABLET | Freq: Every day | ORAL | Status: DC
  Administered 2020-03-06 – 2020-03-07 (×2): 10 mg via ORAL
  Filled 2020-03-06 (×2): qty 1

## 2020-03-06 NOTE — Progress Notes (Signed)
PROGRESS NOTE    Tina Chase  GNF:[id number] DOB: 03-25-1941 DOA: 02/27/2020 PCP: Tyson AliasVincent, Duncan Thomas, MD   Brief Narrative:  79 yo Past medical history of HTN, HLD, OSA.  Presents with complaints of cough and shortness of breath found to have acute pneumonia with acute hypoxic respiratory failure requiring intubation.  Currently extubated and stable on home oxygen. Currently plan is continue to treat respiratory distress and arrange for SNF.   Assessment & Plan:   Active Problems:   Acute respiratory failure (HCC)  Acute hypoxic, hypercarbic respiratory failure Bilateral Lower Lobe Consolidation, Possible Aspiration PNA -Combination of obesity, obstructive sleep apnea and diastolic CHF. -Cultures remain negative.  Speech and swallow recommending regular diet -She will need to use CPAP bedtime and as needed during sleeping at home -PT/OT recommending home health.  Acute on chronic diastolic CHF, EF 84%55%, grade 1 diastolic dysfunction Pulmonary hypertension, increased arterial pressure -Continue diuretics-Lasix 80 mg daily, Aldactone 25 mg daily -Echocardiogram from 2017 reviewed. -Repeat echocardiogram  BaselineSevereOSA(AHI 124.5/h) Continue CPAP.  Acute metabolic encephalopathy occurred on 03/04/2020 currently resolved. P-this is improved.  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  Already on diuretics Lasix and Aldactone  Morbid obesity Body mass index is 58.19 kg/m.  Nutrition Problem: Inadequate oral intake Etiology: inability to eat Interventions: Interventions: Refer to RD note for recommendations      DVT prophylaxis: Xarelto 10 mg daily Code Status:  Family Communication: None  at bedside  Status is: Inpatient  Remains inpatient appropriate because:Inpatient level of care appropriate due to severity of illness   Dispo: The patient is from: Home              Anticipated d/c is to: Home              Anticipated d/c date is: 2 days              Patient currently is not medically stable to d/c.  Still very dyspneic with minimal movement.  She requires close inpatient monitoring   Body mass index is 57.36 kg/m.    Subjective: Feels little better but still has some exertional shortness of breath.  Review of Systems Otherwise negative except as per HPI, including: General: Denies fever, chills, night sweats or unintended weight loss. Resp: Denies hemoptysis Cardiac: Denies chest pain, palpitations, orthopnea, paroxysmal nocturnal dyspnea. GI: Denies abdominal pain, nausea, vomiting, diarrhea or constipation GU: Denies dysuria, frequency, hesitancy or incontinence MS: Denies muscle aches, joint pain or swelling Neuro: Denies headache, neurologic deficits (focal weakness, numbness, tingling), abnormal gait Psych: Denies anxiety, depression, SI/HI/AVH Skin: Denies new rashes or lesions ID: Denies sick contacts, exotic exposures, travel  Examination:  General exam: Appears calm and comfortable, morbidly obese on 2 L nasal cannula Respiratory system: Bilateral diminished breath sounds midway upper lung field Cardiovascular system: S1 & S2 heard, RRR. No JVD, murmurs, rubs, gallops or clicks.  2+ bilateral lower extremity pitting edema Gastrointestinal system: Abdomen is nondistended, soft and nontender. No organomegaly or masses felt. Normal bowel sounds heard. Central nervous system: Alert and oriented. No focal neurological deficits. Extremities: Symmetric 5 x 5 power. Skin: No rashes, lesions or ulcers Psychiatry: Judgement and insight appear normal. Mood & affect appropriate.     Objective: Vitals:  03/05/20 1929 03/05/20 2129 03/06/20 0317 03/06/20  1315  BP: (!) 163/71  140/63 (!) 146/71  Pulse: 96 90 98 95  Resp: 19 16 18 18   Temp: 97.7 F (36.5 C)  (!) 97.5 F (36.4 C) 98 F (36.7 C)  TempSrc: Oral  Oral   SpO2: 99% 94% 95% 100%  Weight:   (!) 137.7 kg   Height:        Intake/Output Summary (Last 24 hours) at 03/06/2020 1319 Last data filed at 03/06/2020 1224 Gross per 24 hour  Intake --  Output 3000 ml  Net -3000 ml   Filed Weights   03/03/20 0500 03/04/20 0520 03/06/20 0317  Weight: (!) 142 kg (!) 139.7 kg (!) 137.7 kg     Data Reviewed:   CBC: Recent Labs  Lab 02/29/20 0524 03/01/20 0222 03/02/20 0441 03/06/20 0343  WBC 14.3* 12.5* 9.4 11.3*  NEUTROABS  --   --   --  7.8*  HGB 13.1 12.8 12.4 11.3*  HCT 38.8 40.0 39.9 37.2  MCV 94.9 96.6 100.0 100.0  PLT 141* 162 168 180   Basic Metabolic Panel: Recent Labs  Lab 02/28/20 1636 02/28/20 1636 02/29/20 0524 03/01/20 0222 03/03/20 1718 03/04/20 0405 03/04/20 1654 03/05/20 0342 03/06/20 0343  NA  --   --  141   < > 144 144 142 144 142  K  --   --  3.7   < > 4.2 3.7 4.0 3.8 3.6  CL  --   --  100   < > 98 99 98 95* 97*  CO2  --   --  32   < > 36* 37* 37* 39* 36*  GLUCOSE  --   --  91   < > 109* 90 101* 90 94  BUN  --   --  18   < > 10 11 10 11 12   CREATININE  --   --  0.83   < > 0.68 0.60 0.60 0.62 0.60  CALCIUM  --   --  9.0   < > 9.5 9.3 8.9 9.5 9.5  MG 2.0   < > 1.8  --  1.7 1.7 1.6* 2.1 2.0  PHOS 2.1*  --  2.2*  --   --   --   --   --   --    < > = values in this interval not displayed.   GFR: Estimated Creatinine Clearance: 77.9 mL/min (by C-G formula based on SCr of 0.6 mg/dL). Liver Function Tests: Recent Labs  Lab 03/02/20 0441 03/06/20 0343  AST 24 18  ALT 19 15  ALKPHOS 71 70  BILITOT 0.8 0.6  PROT 6.3* 5.9*  ALBUMIN 2.9* 2.8*   No results for input(s): LIPASE, AMYLASE in the last 168 hours. No results for input(s): AMMONIA in the last 168 hours. Coagulation Profile: No results for input(s): INR, PROTIME in the last 168  hours. Cardiac Enzymes: No results for input(s): CKTOTAL, CKMB, CKMBINDEX, TROPONINI in the last 168 hours. BNP (last 3 results) No results for input(s): PROBNP in the last 8760 hours. HbA1C: No results for input(s): HGBA1C in the last 72 hours. CBG: Recent Labs  Lab 02/29/20 2324 03/01/20 0342 03/01/20 0749 03/01/20 1234 03/01/20 1602  GLUCAP 73 70 83 83 107*   Lipid Profile: No results for input(s): CHOL, HDL, LDLCALC, TRIG, CHOLHDL, LDLDIRECT in the last 72 hours. Thyroid Function Tests: No results for input(s): TSH, T4TOTAL, FREET4, T3FREE, THYROIDAB in the last 72 hours. Anemia  Panel: No results for input(s): VITAMINB12, FOLATE, FERRITIN, TIBC, IRON, RETICCTPCT in the last 72 hours. Sepsis Labs: No results for input(s): PROCALCITON, LATICACIDVEN in the last 168 hours.  Recent Results (from the past 240 hour(s))  Urine culture     Status: Abnormal   Collection Time: 02/27/20  1:30 PM   Specimen: Urine, Random  Result Value Ref Range Status   Specimen Description   Final    URINE, RANDOM Performed at [facility], [address] [place], [place] [address]    Special Requests   Final    NONE Performed at [facility], [address], [place], [place] [address]    Culture MULTIPLE SPECIES PRESENT, SUGGEST RECOLLECTION (A)  Final   Report Status 02/29/2020 FINAL  Final  SARS Coronavirus 2 by RT PCR (hospital order, performed in [facility] hospital lab) Nasopharyngeal Nasopharyngeal Swab     Status: None   Collection Time: 02/27/20  2:40 PM   Specimen: Nasopharyngeal Swab  Result Value Ref Range Status   SARS Coronavirus 2 NEGATIVE NEGATIVE Final    Comment: (NOTE) SARS-CoV-2 target nucleic acids are NOT DETECTED.  The SARS-CoV-2 RNA is generally detectable in upper and lower respiratory specimens during the acute phase of infection. The lowest concentration of SARS-CoV-2 viral copies this assay can detect is 250 copies / mL.  A negative result does not preclude SARS-CoV-2 infection and should not be used as the sole basis for treatment or other patient management decisions.  A negative result may occur with improper specimen collection / handling, submission of specimen other than nasopharyngeal swab, presence of viral mutation(s) within the areas targeted by this assay, and inadequate number of viral copies (<250 copies / mL). A negative result must be combined with clinical observations, patient history, and epidemiological information.  Fact Sheet for Patients:   BoilerBrush.com.cy  Fact Sheet for Healthcare Providers: https://pope.com/  This test is not yet approved or  cleared by the [place] FDA and has been authorized for detection and/or diagnosis of SARS-CoV-2 by FDA under an Emergency Use Authorization (EUA).  This EUA will remain in effect (meaning this test can be used) for the duration of the COVID-19 declaration under Section 564(b)(1) of the Act, 21 U.S.C. section 360bbb-3(b)(1), unless the authorization is terminated or revoked sooner.  Performed at [facility], [address] [place], [place] [address]   Culture, blood (routine x 2)     Status: None   Collection Time: 02/27/20  8:41 PM   Specimen: BLOOD  Result Value Ref Range Status   Specimen Description   Final    BLOOD RIGHT ANTECUBITAL Performed at [facility], [address], [place], [place] [address]    Special Requests   Final    BOTTLES DRAWN AEROBIC AND ANAEROBIC Blood Culture adequate volume Performed at [facility], [address] [place], [place] [address]    Culture   Final    NO GROWTH 5 DAYS Performed at [facility], [address] [place], [place] [address]    Report Status 03/03/2020 FINAL  Final  Culture, blood (routine x 2)     Status: None   Collection Time: 02/27/20  8:41 PM    Specimen: BLOOD  Result Value Ref Range Status   Specimen Description   Final    BLOOD LEFT ANTECUBITAL Performed at [facility], [address] [place], [place] [address]    Special Requests  Final    BOTTLES DRAWN AEROBIC AND ANAEROBIC Blood Culture adequate volume Performed at [facility], [address] [place], [place] [address]    Culture   Final    NO GROWTH 5 DAYS Performed at [facility], [address] [place], [place] [address]    Report Status 03/03/2020 FINAL  Final  MRSA PCR Screening     Status: None   Collection Time: 02/28/20  2:02 AM   Specimen: Nasopharyngeal  Result Value Ref Range Status   MRSA by PCR NEGATIVE NEGATIVE Final    Comment:        The GeneXpert MRSA Assay (FDA approved for NASAL specimens only), is one component of a comprehensive MRSA colonization surveillance program. It is not intended to diagnose MRSA infection nor to guide or monitor treatment for MRSA infections. Performed at [facility], [address] [place], [place] [address]          Radiology Studies: No results found.      Scheduled Meds: . chlorhexidine  15 mL Mouth Rinse BID  . Chlorhexidine Gluconate Cloth  6 each Topical Daily  . furosemide  80 mg Oral Daily  . mouth rinse  15 mL Mouth Rinse q12n4p  . multivitamin with minerals  1 tablet Per Tube Daily  . potassium chloride  20 mEq Oral Daily  . rivaroxaban  10 mg Oral QHS  . sodium chloride flush  10-40 mL Intracatheter Q12H  . spironolactone  25 mg Oral Daily   Continuous Infusions:   LOS: 8 days   Time spent= 35 mins    Canyon Lohr Joline Maxcy, MD [facility]  If 7PM-7AM, please contact night-coverage  03/06/2020, 1:19 PM

## 2020-03-06 NOTE — Progress Notes (Signed)
  Echocardiogram 2D Echocardiogram has been performed.  Tina Chase 03/06/2020, 3:56 PM

## 2020-03-06 NOTE — TOC Transition Note (Signed)
Transition of Care [facility]) - CM/SW Discharge Note   Patient Details  Name: JENITA RAYFIELD MRN: [id number] Date of Birth: August 31, 1940  [facility] CM/SW Contact:  Ida Rogue, LCSW Phone Number: 03/06/2020, 9:16 AM   Clinical Narrative:  Checked in with patient this AM as promised following phone calls with family yesterday.  Patient states she has worked it out to return home with family support.  Among her sister and daughters, someone will be with her 24/7 until they are satisfied that she is OK to be by herself.  Daughter from [place] will be returning first to take a shift, and sister will be with her until [place] offspring arrives.  Will arrange for O2 to get her home, and for [facility] PT and OT.  Spoke with Velna Hatchet at [facility], and Cindie at [facility].  Besides O2, also asked for order for CPAP supplies as patient states her unit is broken.  Zack with [facility] will follow up.  Sister will transport home.  TOC sign off.    Final next level of care: Home w Home Health Services Barriers to Discharge: Barriers Resolved   Patient Goals and CMS Choice Patient states their goals for this hospitalization and ongoing recovery are:: "I think I need to go to rehab for a couple of weeks"   Choice offered to / list presented to : Patient  Discharge Placement                       Discharge Plan and Services   Discharge Planning Services: CM Consult Post Acute Care Choice: Skilled Nursing Facility                               Social Determinants of Health (SDOH) Interventions     Readmission Risk Interventions No flowsheet data found.

## 2020-03-06 NOTE — Progress Notes (Signed)
Physical Therapy Treatment Patient Details Name: Tina Chase MRN: [id number] DOB: 21-Dec-1940 Today's Date: 03/06/2020    History of Present Illness 79 year old with history of PE on Xarelto, hypertension, hyperlipidemia, obesity, sleep apnea, Admitted with hypoxic, hypercarbic respiratory failure, Underwent colonoscopy 7/26 and had been feeling weak with desats. Noted to be confused at home 7/27, Intubated in the ED with altered mental status. has a past medical history of Arthritis, Asthma, Chronic kidney disease, Diverticulosis, Hyperlipidemia, Hyperparathyroidism, Hypertension, IDA (iron deficiency anemia), Kidney atrophy, Myocardial infarction (HCC), Obesity, Pica in adults, Pulmonary embolism (HCC), Pulmonary nodule, Sleep apnea, and Small bowel obstruction (HCC).  Intubated 7/27-7/29 at 1130 am.  CT chest 7/27 Bilateral lower lobe consolidation with rounded right upper lobeconsolidation consistent with multifocal infiltrate    PT Comments    Pt OOB in recliner on 2 lts at 96% and 103.  Sister in room visiting.  Assisted with amb to bathroom.  Pt required need for a walker for stability and present with limited activity tolerance.  Remained on 2 lts while toileting.  Pt was able to self rise and lower onto toilet but required assist for peri care.  Also required rest breaks between activity due to dyspnea.  Assisted with amb in hallway trial RA:  SATURATION QUALIFICATIONS: (This note is used to comply with regulatory documentation for home oxygen)  Patient Saturations on Room Air at Rest = 91%  Patient Saturations on Room Air while Ambulating 26 feet = 83% with HR 136  Patient Saturations on 2 Liters of oxygen while Ambulating = 91%  Please briefly explain why patient needs home oxygen:  Pt requires supplemental oxygen during activity   Pt lives home alone and progressing slowly.  Pt will need 24/7 assist at home otherwise rec [facility]F  Follow Up Recommendations  [facility] 24/7  assist If not then SNF     Equipment Recommendations  None recommended by PT    Recommendations for Other Services       Precautions / Restrictions Precautions Precautions: Fall Precaution Comments: monitor VS Restrictions Weight Bearing Restrictions: No    Mobility  Bed Mobility Overal bed mobility: Needs Assistance Bed Mobility: Supine to Sit       Sit to supine: Mod assist;Max assist   General bed mobility comments: assist to support b LE one at a time up onto bed as pt is unable self  Transfers Overall transfer level: Needs assistance Equipment used: Rolling walker (2 wheeled) Transfers: Sit to/from [employer] Sit to Stand: Supervision Stand pivot transfers: Supervision;Min guard       General transfer comment: self able with increased time and forward weight shift.  Also self able off regular height commode  Ambulation/Gait Ambulation/Gait assistance: Supervision;Min guard Gait Distance (Feet): 42 Feet Assistive device: Rolling walker (2 wheeled) Gait Pattern/deviations: Step-through pattern;Decreased stance time - left;Wide base of support;Trunk flexed Gait velocity: decreased   General Gait Details: decreased amb distance this session due to fatigue/dyspnea and HR increasing to 136 with trial RA 83%    Reapplied 2 lts to [employer] sats > 90%   Stairs             Wheelchair Mobility    Modified Rankin (Stroke Patients Only)       Balance  Cognition Arousal/Alertness: Awake/alert Behavior During Therapy: WFL for tasks assessed/performed Overall Cognitive Status: Within Functional Limits for tasks assessed                                 General Comments: AxO x 3 pleasant      Exercises      General Comments        Pertinent Vitals/Pain Pain Assessment: Faces Faces Pain Scale: Hurts a little bit Pain Location: bottom Pain Descriptors /  Indicators: Aching;Pressure Pain Intervention(s): Monitored during session    Home Living                      Prior Function            PT Goals (current goals can now be found in the care plan section) Progress towards PT goals: Progressing toward goals    Frequency    Min 3X/week      PT Plan Current plan remains appropriate    Co-evaluation              AM-PAC PT "6 Clicks" Mobility   Outcome Measure  Help needed turning from your back to your side while in a flat bed without using bedrails?: A Little Help needed moving from lying on your back to sitting on the side of a flat bed without using bedrails?: A Little Help needed moving to and from a bed to a chair (including a wheelchair)?: A Little Help needed standing up from a chair using your arms (e.g., wheelchair or bedside chair)?: A Little Help needed to walk in hospital room?: A Little Help needed climbing 3-5 steps with a railing? : A Lot 6 Click Score: 17    End of Session Equipment Utilized During Treatment: Gait belt Activity Tolerance: Patient limited by fatigue Patient left: in bed;with family/visitor present;with call bell/phone within reach Nurse Communication: Mobility status PT Visit Diagnosis: Unsteadiness on feet (R26.81);Difficulty in walking, not elsewhere classified (R26.2)     Time: 1405-1430 PT Time Calculation (min) (ACUTE ONLY): 25 min  Charges:  $Gait Training: 8-22 mins $Therapeutic Activity: 8-22 mins                     {Geet Hosking  PTA Acute  Rehabilitation Services Pager      [phone] Office      [phone]

## 2020-03-07 ENCOUNTER — Telehealth: Payer: Self-pay

## 2020-03-07 DIAGNOSIS — D6859 Other primary thrombophilia: Secondary | ICD-10-CM

## 2020-03-07 DIAGNOSIS — D509 Iron deficiency anemia, unspecified: Secondary | ICD-10-CM

## 2020-03-07 LAB — CBC
HCT: 37 % (ref 36.0–46.0)
Hemoglobin: 11.4 g/dL — ABNORMAL LOW (ref 12.0–15.0)
MCH: 30.6 pg (ref 26.0–34.0)
MCHC: 30.8 g/dL (ref 30.0–36.0)
MCV: 99.5 fL (ref 80.0–100.0)
Platelets: 208 10*3/uL (ref 150–400)
RBC: 3.72 MIL/uL — ABNORMAL LOW (ref 3.87–5.11)
RDW: 15.1 % (ref 11.5–15.5)
WBC: 10.2 10*3/uL (ref 4.0–10.5)
nRBC: 0 % (ref 0.0–0.2)

## 2020-03-07 LAB — BASIC METABOLIC PANEL
Anion gap: 7 (ref 5–15)
BUN: 11 mg/dL (ref 8–23)
CO2: 37 mmol/L — ABNORMAL HIGH (ref 22–32)
Calcium: 9.6 mg/dL (ref 8.9–10.3)
Chloride: 94 mmol/L — ABNORMAL LOW (ref 98–111)
Creatinine, Ser: 0.62 mg/dL (ref 0.44–1.00)
GFR calc Af Amer: >60 mL/min (ref >60)
GFR calc non Af Amer: >60 mL/min (ref >60)
Glucose, Bld: 103 mg/dL — ABNORMAL HIGH (ref 70–99)
Potassium: 3.6 mmol/L (ref 3.5–5.1)
Sodium: 138 mmol/L (ref 135–145)

## 2020-03-07 LAB — BRAIN NATRIURETIC PEPTIDE: B Natriuretic Peptide: 32.4 pg/mL (ref 0.0–100.0)

## 2020-03-07 LAB — PROCALCITONIN: Procalcitonin: <0.1 ng/mL

## 2020-03-07 LAB — MAGNESIUM: Magnesium: 1.7 mg/dL (ref 1.7–2.4)

## 2020-03-07 MED ORDER — SPIRONOLACTONE 25 MG PO TABS: 25.0000 mg | ORAL_TABLET | Freq: Every day | ORAL | 0 refills | Status: DC

## 2020-03-07 NOTE — Discharge Summary (Signed)
Physician Discharge Summary  Tina Chase QMV:[id number] DOB: [birth year] DOA: 02/27/2020  PCP: Axel Filler, MD  Admit date: 02/27/2020 Discharge date: 03/07/2020  Admitted From: Home Disposition: Home  Recommendations for Outpatient Follow-up:  1. Follow up with PCP in 1-2 weeks 2. Please obtain BMP/CBC in one week your next doctors visit.  3. Continue using home oxygen, she is advised to wear CPAP every time she is sleeping even during the day. 4. Daily Aldactone 25 mg added. Stop potassium supplements 5. Would benefit from outpatient pulmonary referral   Discharge Condition: Stable CODE STATUS: Full code Diet recommendation: Heart healthy  Brief/Interim Summary: 79 yo Past medical history of HTN, HLD, OSA. Presents with complaints of cough and shortness of breath found to have acute pneumonia with acute hypoxic respiratory failure requiring intubation. There was concerns of aspiration pneumonitis with signs of fluid overload complicated by sleep apnea and hypercapnia. With time she was diuresed her mental status improved, she was mandatorily put on CPAP every time she was asleep. Repeated echocardiogram which showed EF of 55% with grade 1 diastolic dysfunction. She was seen by PT who initially recommended SNF but then home with 24 7 help. Patient wanted to go home. All arrangements were made. She would highly benefit from outpatient follow-up with pulmonary Today she has reached maximum benefit from hospital stay and stable for discharge with outpatient follow-up recommendations as stated above.  Assessment & Plan:   Active Problems:   Acute respiratory failure (HCC)  Acute hypoxic, hypercarbic respiratory failure Bilateral Lower Lobe Consolidation, Possible Aspiration PNA -Combination of obesity, obstructive sleep apnea and diastolic CHF. -Cultures remain negative.  Speech and swallow recommending regular diet -She will need to use CPAP bedtime and as needed during  sleeping at home -PT/OT recommending home health. Arrangements made -Follow-up with outpatient pulmonary periodically  Acute on chronic diastolic CHF, EF 28%, grade 1 diastolic dysfunction Pulmonary hypertension, increased arterial pressure -Continue diuretics-Lasix 80 mg daily, Aldactone 25 mg daily, discontinue home potassium supplements. Would benefit from outpatient follow-up with cardiology periodically -Echocardiogram from 2017 reviewed. -Repeat echo shows EF of 55%, grade 1 DD.  BaselineSevereOSA(AHI 124.5/h) Continue CPAP nightly every time whenever she is asleep  Acute metabolic encephalopathyoccurred on 03/04/2020 currently resolved. 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  Already on diuretics Lasix and Aldactone  Morbid obesity Body mass index is 58.19 kg/m. Nutrition Problem: Inadequate oral intake Etiology: inability to eat Interventions: Interventions: Refer to RD note for recommendations   Body mass index is 56.15 kg/m.         Discharge Diagnoses:  Principal Problem:   Acute respiratory failure ([place]) Active Problems:   Hypertension   Obesity, Class III, BMI 40-49.9 (morbid obesity) (HCC)   Bilateral lower extremity edema   Sleep apnea   Hypercoagulable state (HCC)   Iron deficiency anemia   Erosive gastritis      Consultations:  Pulmonary  Subjective: Patient feels great no complaints, no other acute events overnight. Reports she feels back to her baseline.  Discharge Exam: Vitals:   03/06/20 1941 03/07/20 0356  BP: 139/65 137/65  Pulse: 82 86  Resp: 18 (!) 21  Temp: 98.2 F (36.8 C) 98.7 F (37.1 C)  SpO2: 99% (!) 88%   Vitals:   03/06/20 1315 03/06/20 1941 03/07/20 0356 03/07/20 0506  BP: (!) 146/71 139/65 137/65   Pulse:  95 82 86   Resp: 18 18 (!) 21   Temp: 98 F  (36.7 C) 98.2 F (36.8 C) 98.7 F (37.1 C)   TempSrc:  Oral Oral   SpO2: 100% 99% (!) 88%   Weight:    134.8 kg  Height:        General: Pt is alert, awake, not in acute distress, morbid obesity Cardiovascular: RRR, S1/S2 +, no rubs, no gallops Respiratory: Diffuse diminished breath sounds due to her body habitus. Abdominal: Soft, NT, ND, bowel sounds + Extremities: no edema, no cyanosis  Discharge Instructions  Discharge Instructions    Ambulatory referral to Pulmonology   Complete by: As directed    Chronic hypoxia   Reason for referral: Other     Allergies as of 03/07/2020      Reactions   Hydralazine Anaphylaxis, Other (See Comments)   Swelling tongue.   Lisinopril Swelling, Other (See Comments)   REACTION: angioedema Tongue swelling   Aspirin Other (See Comments)   REACTION: hives   Crab [shellfish Allergy] Hives, Other (See Comments)   Can eat shrimp and fish and lobster      Medication List    STOP taking these medications   Potassium Citrate 15 MEQ (1620 MG) Tbcr     TAKE these medications   acetaminophen 325 MG tablet Commonly known as: TYLENOL Take 975 mg by mouth every 6 (six) hours as needed for moderate pain or headache.   albuterol 108 (90 Base) MCG/ACT inhaler Commonly known as: VENTOLIN HFA INHALE 2 PUFFS INTO THE LUNGS EVERY 4 HOURS AS NEEDED FOR WHEEZING OR SHORTNESS OF BREATH What changed: See the new instructions.   allopurinol 300 MG tablet Commonly known as: ZYLOPRIM Take 300 mg by mouth at bedtime.   amLODipine 10 MG tablet Commonly known as: NORVASC Take 1 tablet (10 mg total) by mouth daily. What changed: when to take this   carvedilol 25 MG tablet Commonly known as: COREG Take 1 tablet (25 mg total) by mouth 2 (two) times daily.   Flovent HFA 44 MCG/ACT inhaler Generic drug: fluticasone Inhale 2 puffs into the lungs daily.   furosemide 80 MG tablet Commonly known as: LASIX Take 1 tablet (80 mg total) by mouth daily.    iron polysaccharides 150 MG capsule Commonly known as: Nu-Iron Take 1 capsule (150 mg total) by mouth daily.   loperamide 2 MG capsule Commonly known as: IMODIUM TAKE 1 CAPSULE BY MOUTH  EVERY 6 HOURS AS NEEDED FOR DIARRHEA OR LOOSE STOOLS What changed: See the new instructions.   Mucinex Maximum Strength 1200 MG Tb12 Generic drug: Guaifenesin Take 1 tablet by mouth daily.   pantoprazole 40 MG tablet Commonly known as: PROTONIX Take 1 tablet (40 mg total) by mouth daily.   PRESERVISION AREDS 2 PO Take 2 tablets by mouth daily.   rivaroxaban 10 MG Tabs tablet Commonly known as: Xarelto Take 1 tablet (10 mg total) by mouth daily at 10 pm.   spironolactone 25 MG tablet Commonly known as: ALDACTONE Take 1 tablet (25 mg total) by mouth daily.   Sutab [phone] MG Tabs Generic drug: Sodium Sulfate-Mag Sulfate-KCl Take 1 kit by mouth as directed. BIN: [id number] PCN: CN GROUP: [id number] MEMBER ID: [id number]; DO NOT RUN AS CASH            Durable Medical Equipment  (From admission, onward)         Start     Ordered   03/07/20 1112  For home use  only DME oxygen  Once       Comments: Evaluate for POC  Question Answer Comment  Length of Need Lifetime   Mode or (Route) Nasal cannula   Liters per Minute 3   Frequency Continuous (stationary and portable oxygen unit needed)   Oxygen delivery system Gas      03/07/20 1111   03/07/20 1112  For home use only DME continuous positive airway pressure (CPAP)  Once       Comments: Mask and supplies  Question Answer Comment  Length of Need Lifetime   Patient has OSA or probable OSA Yes   Is the patient currently using CPAP in the home Yes   Settings 11-15   Signs and symptoms of probable OSA  (select all that apply) Witnessed apneas   Signs and symptoms of probable OSA  (select all that apply) Gasping during sleep   Signs and symptoms of probable OSA  (select all that apply) Snoring   CPAP supplies needed Mask, headgear,  cushions, filters, heated tubing and water chamber      03/07/20 1112          Follow-up Information    Axel Filler, MD. Schedule an appointment as soon as possible for a visit on 03/14/2020.   Specialty: Internal Medicine Why: Thursday at 9:45 for your hospital follow up appointment Contact information: [address] [place] [address] [phone]              Allergies  Allergen Reactions  . Hydralazine Anaphylaxis and Other (See Comments)    Swelling tongue.  Marland Kitchen Lisinopril Swelling and Other (See Comments)    REACTION: angioedema Tongue swelling  . Aspirin Other (See Comments)    REACTION: hives  . Crab [Shellfish Allergy] Hives and Other (See Comments)    Can eat shrimp and fish and lobster    You were cared for by a hospitalist during your hospital stay. If you have any questions about your discharge medications or the care you received while you were in the hospital after you are discharged, you can call the unit and asked to speak with the hospitalist on call if the hospitalist that took care of you is not available. Once you are discharged, your primary care physician will handle any further medical issues. Please note that no refills for any discharge medications will be authorized once you are discharged, as it is imperative that you return to your primary care physician (or establish a relationship with a primary care physician if you do not have one) for your aftercare needs so that they can reassess your need for medications and monitor your lab values.   Procedures/Studies: CT Head Wo Contrast  Result Date: 02/27/2020 CLINICAL DATA:  Altered mental status and hypoxia EXAM: CT HEAD WITHOUT CONTRAST TECHNIQUE: Contiguous axial images were obtained from the base of the skull through the vertex without intravenous contrast. COMPARISON:  None. FINDINGS: Brain: No evidence of acute infarction, hemorrhage, hydrocephalus, extra-axial collection or  mass lesion/mass effect. Vascular: No hyperdense vessel or unexpected calcification. Skull: Normal. Negative for fracture or focal lesion. Sinuses/Orbits: No acute finding. Other: None. IMPRESSION: No acute intracranial abnormality noted. Electronically Signed   By: Inez Catalina M.D.   On: 02/27/2020 19:13   CT Angio Chest PE W/Cm &/Or Wo Cm  Result Date: 02/27/2020 CLINICAL DATA:  Altered mental status with hypoxia, initial encounter EXAM: CT ANGIOGRAPHY CHEST WITH CONTRAST TECHNIQUE: Multidetector CT imaging of the chest was performed  using the standard protocol during bolus administration of intravenous contrast. Multiplanar CT image reconstructions and MIPs were obtained to evaluate the vascular anatomy. CONTRAST:  149m OMNIPAQUE IOHEXOL 350 MG/ML SOLN COMPARISON:  11/06/2015 FINDINGS: Cardiovascular: Thoracic aorta demonstrates atherosclerotic calcifications. No aneurysmal dilatation is seen. Opacification is limited and dissection cannot be determined. No significant cardiac enlargement is seen. The pulmonary artery shows no definitive filling defect to suggest pulmonary embolism. Mediastinum/Nodes: Thoracic inlet is within normal limits. Endotracheal tube and gastric catheter are noted. The esophagus as visualized is unremarkable. No sizable hilar or mediastinal adenopathy is noted. Lungs/Pleura: Lungs are well aerated bilaterally. Bilateral lower lobe consolidation is noted left slightly greater than right without significant effusion. A rounded area of increased density is noted in the posterior aspect of the right upper lobe also likely related to focal consolidation. No pneumothorax is noted. No sizable parenchymal nodule is seen. Upper Abdomen: No acute abnormality. Musculoskeletal: Degenerative changes of the thoracic spine are noted. No acute bony abnormality is seen. Review of the MIP images confirms the above findings. IMPRESSION: No evidence of pulmonary emboli. Bilateral lower lobe  consolidation with rounded right upper lobe consolidation consistent with multifocal infiltrate. No associated effusions are seen. Aortic Atherosclerosis (ICD10-I70.0). Electronically Signed   By: MInez CatalinaM.D.   On: 02/27/2020 19:12   DG CHEST PORT 1 VIEW  Result Date: 02/29/2020 CLINICAL DATA:  Acute respiratory failure with hypoxia EXAM: PORTABLE CHEST 1 VIEW COMPARISON:  02/28/2020 FINDINGS: Mild cardiomegaly, vascular congestion. Bibasilar atelectasis, left greater than right, similar to prior study. No effusions or acute bony abnormality. IMPRESSION: Stable vascular congestion and bibasilar atelectasis. Electronically Signed   By: KRolm BaptiseM.D.   On: 02/29/2020 12:37   DG Chest Port 1 View  Result Date: 02/28/2020 CLINICAL DATA:  Acute respiratory failure EXAM: PORTABLE CHEST 1 VIEW COMPARISON:  Yesterday FINDINGS: Endotracheal tube with tip at the clavicular heads. The enteric tube reaches the stomach. Cardiopericardial enlargement and vascular pedicle widening distorted by rotation. Vascular congestion and hazy density at the bases. There is atelectasis by recent CT IMPRESSION: 1. Unremarkable hardware positioning. 2. Cardiomegaly and pulmonary artery enlargement. 3. Atelectasis greatest at the left base. Electronically Signed   By: JMonte FantasiaM.D.   On: 02/28/2020 05:56   DG Chest Portable 1 View  Result Date: 02/27/2020 CLINICAL DATA:  Endotracheal and OG tube placement EXAM: PORTABLE CHEST 1 VIEW COMPARISON:  Earlier same day FINDINGS: New enteric tube passes into the stomach. New endotracheal tube is approximately 4 cm above the carina. Stable lung aeration. Cardiomegaly. IMPRESSION: Lines and tubes in place as detailed above. Electronically Signed   By: PMacy MisM.D.   On: 02/27/2020 17:53   DG Chest Port 1 View  Result Date: 02/27/2020 CLINICAL DATA:  Shortness of breath. EXAM: PORTABLE CHEST 1 VIEW COMPARISON:  November 06, 2015. FINDINGS: Stable cardiomegaly with mild  central pulmonary vascular congestion. No pneumothorax or definite pleural effusion is noted. No consolidative process is noted. Bony thorax is unremarkable. IMPRESSION: Stable cardiomegaly with mild central pulmonary vascular congestion. Aortic Atherosclerosis (ICD10-I70.0). Electronically Signed   By: JMarijo ConceptionM.D.   On: 02/27/2020 14:19   ECHOCARDIOGRAM COMPLETE  Result Date: 03/06/2020    ECHOCARDIOGRAM REPORT   Patient Name:   Tina NEIDERTDate of Exam: 03/06/2020 Medical Rec #:  [id number]     Height:       61.0 in Accession #:    [id number]    Weight:  303.6 lb Date of Birth:  [birth year]      BSA:          2.255 m Patient Age:    73 years       BP:           146/71 mmHg Patient Gender: F              HR:           95 bpm. Exam Location:  Inpatient Procedure: 2D Echo Indications:    Cardiomyopathy Unspecified I42.9  History:        Patient has prior history of Echocardiogram examinations, most                 recent 11/09/2015. Risk Factors:Hypertension and Dyslipidemia.  Sonographer:    Mikki Santee RDCS (AE) Referring Phys: [id number] Steel Kerney CHIRAG Aizley Stenseth IMPRESSIONS  1. Left ventricular ejection fraction, by estimation, is 55 to 60%. The left ventricle has normal function. The left ventricle has no regional wall motion abnormalities. Left ventricular diastolic parameters are consistent with Grade I diastolic dysfunction (impaired relaxation).  2. Right ventricular systolic function is normal. The right ventricular size is normal. Tricuspid regurgitation signal is inadequate for assessing PA pressure.  3. Left atrial size was mildly dilated.  4. The mitral valve is normal in structure. No evidence of mitral valve regurgitation.  5. The aortic valve is tricuspid. Aortic valve regurgitation is not visualized. Mild to moderate aortic valve sclerosis/calcification is present, without any evidence of aortic stenosis.  6. The inferior vena cava is normal in size with greater than 50% respiratory  variability, suggesting right atrial pressure of 3 mmHg. Comparison(s): Prior images unable to be directly viewed, comparison made by report only. The previous signs of acute cor pulmonale/pulmonary embolism are no longer present. The PA pressure could not be calculated on the current study, but there are no indirect signs of pulmonary hypertension. FINDINGS  Left Ventricle: Left ventricular ejection fraction, by estimation, is 55 to 60%. The left ventricle has normal function. The left ventricle has no regional wall motion abnormalities. The left ventricular internal cavity size was normal in size. There is  no left ventricular hypertrophy. Left ventricular diastolic parameters are consistent with Grade I diastolic dysfunction (impaired relaxation). Indeterminate filling pressures. Right Ventricle: The right ventricular size is normal. No increase in right ventricular wall thickness. Right ventricular systolic function is normal. Tricuspid regurgitation signal is inadequate for assessing PA pressure. Left Atrium: Left atrial size was mildly dilated. Right Atrium: Right atrial size was normal in size. Pericardium: There is no evidence of pericardial effusion. Mitral Valve: The mitral valve is normal in structure. Mild mitral annular calcification. No evidence of mitral valve regurgitation. Tricuspid Valve: The tricuspid valve is normal in structure. Tricuspid valve regurgitation is not demonstrated. Aortic Valve: The aortic valve is tricuspid. . There is moderate thickening and moderate calcification of the aortic valve. Aortic valve regurgitation is not visualized. Mild to moderate aortic valve sclerosis/calcification is present, without any evidence of aortic stenosis. There is moderate thickening of the aortic valve. There is moderate calcification of the aortic valve. Pulmonic Valve: The pulmonic valve was grossly normal. Pulmonic valve regurgitation is trivial. Aorta: The aortic root and ascending aorta are  structurally normal, with no evidence of dilitation. Venous: The inferior vena cava is normal in size with greater than 50% respiratory variability, suggesting right atrial pressure of 3 mmHg. IAS/Shunts: No atrial level shunt detected by color flow Doppler.  LEFT VENTRICLE  PLAX 2D LVIDd:         4.19 cm  Diastology LVIDs:         2.90 cm  LV e' lateral:   5.66 cm/s LV PW:         1.11 cm  LV E/e' lateral: 13.5 LV IVS:        0.99 cm  LV e' medial:    6.96 cm/s LVOT diam:     2.40 cm  LV E/e' medial:  11.0 LV SV:         91 LV SV Index:   40 LVOT Area:     4.52 cm  RIGHT VENTRICLE RV S prime:     17.20 cm/s TAPSE (M-mode): 1.8 cm LEFT ATRIUM             Index       RIGHT ATRIUM           Index LA diam:        3.90 cm 1.73 cm/m  RA Area:     16.80 cm LA Vol (A2C):   41.7 ml 18.49 ml/m RA Volume:   41.95 ml  18.60 ml/m LA Vol (A4C):   36.2 ml 16.05 ml/m LA Biplane Vol: 41.6 ml 18.45 ml/m  AORTIC VALVE LVOT Vmax:   117.00 cm/s LVOT Vmean:  86.700 cm/s LVOT VTI:    0.201 m  AORTA Ao Root diam: 3.30 cm MITRAL VALVE MV Area (PHT): 4.49 cm     SHUNTS MV Decel Time: 169 msec     Systemic VTI:  0.20 m MV E velocity: 76.60 cm/s   Systemic Diam: 2.40 cm MV A velocity: 107.00 cm/s MV E/A ratio:  0.72 Mihai Croitoru MD Electronically signed by Sanda Klein MD Signature Date/Time: 03/06/2020/4:21:30 PM    Final    [place] EKG SITE RITE  Result Date: 02/29/2020 If Site Rite image not attached, placement could not be confirmed due to current cardiac rhythm.     The results of significant diagnostics from this hospitalization (including imaging, microbiology, ancillary and laboratory) are listed below for reference.     Microbiology: Recent Results (from the past 240 hour(s))  Urine culture     Status: Abnormal   Collection Time: 02/27/20  1:30 PM   Specimen: Urine, Random  Result Value Ref Range Status   Specimen Description   Final    URINE, RANDOM Performed at [facility]  [address], [place], [place] [address]    Special Requests   Final    NONE Performed at [facility], [place] [address], [place], [place] [address]    Culture MULTIPLE SPECIES PRESENT, SUGGEST RECOLLECTION (A)  Final   Report Status 02/29/2020 FINAL  Final  SARS Coronavirus 2 by RT PCR (hospital order, performed in [facility] hospital lab) Nasopharyngeal Nasopharyngeal Swab     Status: None   Collection Time: 02/27/20  2:40 PM   Specimen: Nasopharyngeal Swab  Result Value Ref Range Status   SARS Coronavirus 2 NEGATIVE NEGATIVE Final    Comment: (NOTE) SARS-CoV-2 target nucleic acids are NOT DETECTED.  The SARS-CoV-2 RNA is generally detectable in upper and lower respiratory specimens during the acute phase of infection. The lowest concentration of SARS-CoV-2 viral copies this assay can detect is 250 copies / mL. A negative result does not preclude SARS-CoV-2 infection and should not be used as the sole basis for treatment or other patient management decisions.  A negative result may occur with improper specimen  collection / handling, submission of specimen other than nasopharyngeal swab, presence of viral mutation(s) within the areas targeted by this assay, and inadequate number of viral copies (<250 copies / mL). A negative result must be combined with clinical observations, patient history, and epidemiological information.  Fact Sheet for Patients:   StrictlyIdeas.no  Fact Sheet for Healthcare Providers: BankingDealers.co.za  This test is not yet approved or  cleared by the [place] FDA and has been authorized for detection and/or diagnosis of SARS-CoV-2 by FDA under an Emergency Use Authorization (EUA).  This EUA will remain in effect (meaning this test can be used) for the duration of the COVID-19 declaration under Section 564(b)(1) of the Act, 21 U.S.C. section 360bbb-3(b)(1), unless the authorization is  terminated or revoked sooner.  Performed at [facility], [place] [address] [place], [place] [address]   Culture, blood (routine x 2)     Status: None   Collection Time: 02/27/20  8:41 PM   Specimen: BLOOD  Result Value Ref Range Status   Specimen Description   Final    BLOOD RIGHT ANTECUBITAL Performed at [facility] [address], [place], [place] [address]    Special Requests   Final    BOTTLES DRAWN AEROBIC AND ANAEROBIC Blood Culture adequate volume Performed at [facility] [address], [place], [place] [address]    Culture   Final    NO GROWTH 5 DAYS Performed at [facility], [place] [address], [place], [place] [address]    Report Status 03/03/2020 FINAL  Final  Culture, blood (routine x 2)     Status: None   Collection Time: 02/27/20  8:41 PM   Specimen: BLOOD  Result Value Ref Range Status   Specimen Description   Final    BLOOD LEFT ANTECUBITAL Performed at [facility] [address], [place], [place] [address]    Special Requests   Final    BOTTLES DRAWN AEROBIC AND ANAEROBIC Blood Culture adequate volume Performed at [facility] [address], [place], [place] [address]    Culture   Final    NO GROWTH 5 DAYS Performed at [facility], [place] [address] [place], [place] [address]    Report Status 03/03/2020 FINAL  Final  MRSA PCR Screening     Status: None   Collection Time: 02/28/20  2:02 AM   Specimen: Nasopharyngeal  Result Value Ref Range Status   MRSA by PCR NEGATIVE NEGATIVE Final    Comment:        The GeneXpert MRSA Assay (FDA approved for NASAL specimens only), is one component of a comprehensive MRSA colonization surveillance program. It is not intended to diagnose MRSA infection nor to guide or monitor treatment for MRSA infections. Performed at [facility], [place] [address], [place],  [address]       Labs: BNP (last 3 results) Recent Labs    02/27/20 1352 03/07/20 0411  BNP 424.3* 00.7   Basic Metabolic Panel: Recent Labs  Lab 03/04/20 0405 03/04/20 1654 03/05/20 0342 03/06/20 0343 03/07/20 0408  NA 144 142 144 142 138  K 3.7 4.0 3.8 3.6 3.6  CL 99 98 95* 97* 94*  CO2 37* 37* 39* 36* 37*  GLUCOSE 90 101* 90 94 103*  BUN _0 CREATININE 0.60 0.60 0.62 0.60 0.62  CALCIUM 9.3 8.9 9.5 9.5 9.6  MG 1.7 1.6* 2.1 2.0 1.7  Liver Function Tests: Recent Labs  Lab 03/02/20 0441 03/06/20 0343  AST 24 18  ALT 19 15  ALKPHOS 71 70  BILITOT 0.8 0.6  PROT 6.3* 5.9*  ALBUMIN 2.9* 2.8*   No results for input(s): LIPASE, AMYLASE in the last 168 hours. No results for input(s): AMMONIA in the last 168 hours. CBC: Recent Labs  Lab 03/01/20 0222 03/02/20 0441 03/06/20 0343 03/07/20 0408  WBC 12.5* 9.4 11.3* 10.2  NEUTROABS  --   --  7.8*  --   HGB 12.8 12.4 11.3* 11.4*  HCT 40.0 39.9 37.2 37.0  MCV 96.6 100.0 100.0 99.5  PLT 162 168 180 208   Cardiac Enzymes: No results for input(s): CKTOTAL, CKMB, CKMBINDEX, TROPONINI in the last 168 hours. BNP: Invalid input(s): POCBNP CBG: Recent Labs  Lab 02/29/20 2324 03/01/20 0342 03/01/20 0749 03/01/20 1234 03/01/20 1602  GLUCAP 73 70 83 83 107*   D-Dimer No results for input(s): DDIMER in the last 72 hours. Hgb A1c No results for input(s): HGBA1C in the last 72 hours. Lipid Profile No results for input(s): CHOL, HDL, LDLCALC, TRIG, CHOLHDL, LDLDIRECT in the last 72 hours. Thyroid function studies No results for input(s): TSH, T4TOTAL, T3FREE, THYROIDAB in the last 72 hours.  Invalid input(s): FREET3 Anemia work up No results for input(s): VITAMINB12, FOLATE, FERRITIN, TIBC, IRON, RETICCTPCT in the last 72 hours. Urinalysis    Component Value Date/Time   COLORURINE AMBER (A) 02/27/2020 1330   APPEARANCEUR HAZY (A) 02/27/2020 1330   APPEARANCEUR Clear 09/30/2016 1200   LABSPEC 1.023 02/27/2020  1330   PHURINE 5.0 02/27/2020 1330   GLUCOSEU NEGATIVE 02/27/2020 1330   GLUCOSEU NEG mg/dL 06/08/2007 2121   HGBUR NEGATIVE 02/27/2020 1330   BILIRUBINUR NEGATIVE 02/27/2020 1330   BILIRUBINUR negative 09/30/2016 1206   BILIRUBINUR Negative 09/30/2016 1200   KETONESUR NEGATIVE 02/27/2020 1330   PROTEINUR 100 (A) 02/27/2020 1330   UROBILINOGEN 0.2 09/30/2016 1206   UROBILINOGEN 0.2 01/20/2015 0950   NITRITE NEGATIVE 02/27/2020 1330   LEUKOCYTESUR NEGATIVE 02/27/2020 1330   Sepsis Labs Invalid input(s): PROCALCITONIN,  WBC,  LACTICIDVEN Microbiology Recent Results (from the past 240 hour(s))  Urine culture     Status: Abnormal   Collection Time: 02/27/20  1:30 PM   Specimen: Urine, Random  Result Value Ref Range Status   Specimen Description   Final    URINE, RANDOM Performed at [facility], [place] [address] [place], [place] [address]    Special Requests   Final    NONE Performed at [facility], [place] [address] [place], [place] [address]    Culture MULTIPLE SPECIES PRESENT, SUGGEST RECOLLECTION (A)  Final   Report Status 02/29/2020 FINAL  Final  SARS Coronavirus 2 by RT PCR (hospital order, performed in [facility] hospital lab) Nasopharyngeal Nasopharyngeal Swab     Status: None   Collection Time: 02/27/20  2:40 PM   Specimen: Nasopharyngeal Swab  Result Value Ref Range Status   SARS Coronavirus 2 NEGATIVE NEGATIVE Final    Comment: (NOTE) SARS-CoV-2 target nucleic acids are NOT DETECTED.  The SARS-CoV-2 RNA is generally detectable in upper and lower respiratory specimens during the acute phase of infection. The lowest concentration of SARS-CoV-2 viral copies this assay can detect is 250 copies / mL. A negative result does not preclude SARS-CoV-2 infection and should not be used as the sole basis for treatment or other patient management decisions.  A negative result may occur with improper specimen collection /  handling,  submission of specimen other than nasopharyngeal swab, presence of viral mutation(s) within the areas targeted by this assay, and inadequate number of viral copies (<250 copies / mL). A negative result must be combined with clinical observations, patient history, and epidemiological information.  Fact Sheet for Patients:   StrictlyIdeas.no  Fact Sheet for Healthcare Providers: BankingDealers.co.za  This test is not yet approved or  cleared by the [place] FDA and has been authorized for detection and/or diagnosis of SARS-CoV-2 by FDA under an Emergency Use Authorization (EUA).  This EUA will remain in effect (meaning this test can be used) for the duration of the COVID-19 declaration under Section 564(b)(1) of the Act, 21 U.S.C. section 360bbb-3(b)(1), unless the authorization is terminated or revoked sooner.  Performed at [facility], [place] [address], [place], [place] [address]   Culture, blood (routine x 2)     Status: None   Collection Time: 02/27/20  8:41 PM   Specimen: BLOOD  Result Value Ref Range Status   Specimen Description   Final    BLOOD RIGHT ANTECUBITAL Performed at [place] [address] [place], [place] [address]    Special Requests   Final    BOTTLES DRAWN AEROBIC AND ANAEROBIC Blood Culture adequate volume Performed at [place] [address], [place], [place] [address]    Culture   Final    NO GROWTH 5 DAYS Performed at [facility], [place] [address] [place], [place] [address]    Report Status 03/03/2020 FINAL  Final  Culture, blood (routine x 2)     Status: None   Collection Time: 02/27/20  8:41 PM   Specimen: BLOOD  Result Value Ref Range Status   Specimen Description   Final    BLOOD LEFT ANTECUBITAL Performed at [place] [address] [place], [place] [address]    Special Requests   Final     BOTTLES DRAWN AEROBIC AND ANAEROBIC Blood Culture adequate volume Performed at [place] [address] [place], [place] [address]    Culture   Final    NO GROWTH 5 DAYS Performed at [facility], [address], [place], [place] [address]    Report Status 03/03/2020 FINAL  Final  MRSA PCR Screening     Status: None   Collection Time: 02/28/20  2:02 AM   Specimen: Nasopharyngeal  Result Value Ref Range Status   MRSA by PCR NEGATIVE NEGATIVE Final    Comment:        The GeneXpert MRSA Assay (FDA approved for NASAL specimens only), is one component of a comprehensive MRSA colonization surveillance program. It is not intended to diagnose MRSA infection nor to guide or monitor treatment for MRSA infections. Performed at [facility], [place] [address] [place],  [address]      Time coordinating discharge:  I have spent 35 minutes face to face with the patient and on the ward discussing the patients care, assessment, plan and disposition with other care givers. >50% of the time was devoted counseling the patient about the risks and benefits of treatment/Discharge disposition and coordinating care.   SIGNED:   Damita Lack, MD  [facility] 03/07/2020, 12:26 PM   If 7PM-7AM, please contact night-coverage

## 2020-03-07 NOTE — Discharge Instructions (Signed)
Acute Respiratory Failure, Adult  Acute respiratory failure occurs when there is not enough oxygen passing from your lungs to your body. When this happens, your lungs have trouble removing carbon dioxide from the blood. This causes your blood oxygen level to drop too low as carbon dioxide builds up. Acute respiratory failure is a medical emergency. It can develop quickly, but it is temporary if treated promptly. Your lung capacity, or how much air your lungs can hold, may improve with time, exercise, and treatment. What are the causes? There are many possible causes of acute respiratory failure, including:  Lung injury.  Chest injury or damage to the ribs or tissues near the lungs.  Lung conditions that affect the flow of air and blood into and out of the lungs, such as pneumonia, acute respiratory distress syndrome, and cystic fibrosis.  Medical conditions, such as strokes or spinal cord injuries, that affect the muscles and nerves that control breathing.  Blood infection (sepsis).  Inflammation of the pancreas (pancreatitis).  A blood clot in the lungs (pulmonary embolism).  A large-volume blood transfusion.  Burns.  Near-drowning.  Seizure.  Smoke inhalation.  Reaction to medicines.  Alcohol or drug overdose. What increases the risk? This condition is more likely to develop in people who have:  A blocked airway.  Asthma.  A condition or disease that damages or weakens the muscles, nerves, bones, or tissues that are involved in breathing.  A serious infection.  A health problem that blocks the unconscious reflex that is involved in breathing, such as hypothyroidism or sleep apnea.  A lung injury or trauma. What are the signs or symptoms? Trouble breathing is the main symptom of acute respiratory failure. Symptoms may also include:  Rapid breathing.  Restlessness or anxiety.  Skin, lips, or fingernails that appear blue (cyanosis).  Rapid heart  rate.  Abnormal heart rhythms (arrhythmias).  Confusion or changes in behavior.  Tiredness or loss of energy.  Feeling sleepy or having a loss of consciousness. How is this diagnosed? Your health care provider can diagnose acute respiratory failure with a medical history and physical exam. During the exam, your health care provider will listen to your heart and check for crackling or wheezing sounds in your lungs. Your may also have tests to confirm the diagnosis and determine what is causing respiratory failure. These tests may include:  Measuring the amount of oxygen in your blood (pulse oximetry). The measurement comes from a small device that is placed on your finger, earlobe, or toe.  Other blood tests to measure blood gases and to look for signs of infection.  Sampling your cerebral spinal fluid or tracheal fluid to check for infections.  Chest X-ray to look for fluid in spaces that should be filled with air.  Electrocardiogram (ECG) to look at the heart's electrical activity. How is this treated? Treatment for this condition usually takes places in a hospital intensive care unit (ICU). Treatment depends on what is causing the condition. It may include one or more treatments until your symptoms improve. Treatment may include:  Supplemental oxygen. Extra oxygen is given through a tube in the nose, a face mask, or a hood.  A device such as a continuous positive airway pressure (CPAP) or bi-level positive airway pressure (BiPAP or BPAP) machine. This treatment uses mild air pressure to keep the airways open. A mask or other device will be placed over your nose or mouth. A tube that is connected to a motor will deliver oxygen through the   mask.  Ventilator. This treatment helps move air into and out of the lungs. This may be done with a bag and mask or a machine. For this treatment, a tube is placed in your windpipe (trachea) so air and oxygen can flow to the lungs.  Extracorporeal  membrane oxygenation (ECMO). This treatment temporarily takes over the function of the heart and lungs, supplying oxygen and removing carbon dioxide. ECMO gives the lungs a chance to recover. It may be used if a ventilator is not effective.  Tracheostomy. This is a procedure that creates a hole in the neck to insert a breathing tube.  Receiving fluids and medicines.  Rocking the bed to help breathing. Follow these instructions at home:  Take over-the-counter and prescription medicines only as told by your health care provider.  Return to normal activities as told by your health care provider. Ask your health care provider what activities are safe for you.  Keep all follow-up visits as told by your health care provider. This is important. How is this prevented? Treating infections and medical conditions that may lead to acute respiratory failure can help prevent the condition from developing. Contact a health care provider if:  You have a fever.  Your symptoms do not improve or they get worse. Get help right away if:  You are having trouble breathing.  You lose consciousness.  Your have cyanosis or turn blue.  You develop a rapid heart rate.  You are confused. These symptoms may represent a serious problem that is an emergency. Do not wait to see if the symptoms will go away. Get medical help right away. Call your local emergency services (911 in the [place] Do not drive yourself to the hospital. This information is not intended to replace advice given to you by your health care provider. Make sure you discuss any questions you have with your health care provider. Document Revised: 07/02/2017 Document Reviewed: 02/05/2016 Elsevier Patient Education  2020 Elsevier Inc.  

## 2020-03-07 NOTE — Telephone Encounter (Signed)
Returned call to patient-no answer, unable to leave message in mailbox.Tina Spittle Cassady8/5/202112:31 PM

## 2020-03-07 NOTE — Plan of Care (Signed)

## 2020-03-07 NOTE — Progress Notes (Signed)
Right arm PICC removed per protocol for pt's discharge. Manual pressure applied for 5 mins. No bleeding or swelling noted. Instructed patient to remain in bed until 1315. Educated patient about S/S of infection and when to call MD; no heavy lifting or pressure on right side for 24 hours; keep dressing dry and intact until 1300 tomorrow. Pt verbalized comprehension.

## 2020-03-07 NOTE — Telephone Encounter (Signed)
Please call pt back about cpap machine.  

## 2020-03-09 DIAGNOSIS — I0981 Rheumatic heart failure: Secondary | ICD-10-CM | POA: Diagnosis not present

## 2020-03-09 DIAGNOSIS — I5033 Acute on chronic diastolic (congestive) heart failure: Secondary | ICD-10-CM | POA: Diagnosis not present

## 2020-03-09 DIAGNOSIS — Z9181 History of falling: Secondary | ICD-10-CM | POA: Diagnosis not present

## 2020-03-09 DIAGNOSIS — Z9981 Dependence on supplemental oxygen: Secondary | ICD-10-CM | POA: Diagnosis not present

## 2020-03-09 DIAGNOSIS — J9811 Atelectasis: Secondary | ICD-10-CM | POA: Diagnosis not present

## 2020-03-09 DIAGNOSIS — I700 Atherosclerosis of aorta: Secondary | ICD-10-CM | POA: Diagnosis not present

## 2020-03-09 DIAGNOSIS — E785 Hyperlipidemia, unspecified: Secondary | ICD-10-CM | POA: Diagnosis not present

## 2020-03-09 DIAGNOSIS — Z7902 Long term (current) use of antithrombotics/antiplatelets: Secondary | ICD-10-CM | POA: Diagnosis not present

## 2020-03-09 DIAGNOSIS — I272 Pulmonary hypertension, unspecified: Secondary | ICD-10-CM | POA: Diagnosis not present

## 2020-03-09 DIAGNOSIS — I088 Other rheumatic multiple valve diseases: Secondary | ICD-10-CM | POA: Diagnosis not present

## 2020-03-09 DIAGNOSIS — G4733 Obstructive sleep apnea (adult) (pediatric): Secondary | ICD-10-CM | POA: Diagnosis not present

## 2020-03-09 DIAGNOSIS — I110 Hypertensive heart disease with heart failure: Secondary | ICD-10-CM | POA: Diagnosis not present

## 2020-03-09 DIAGNOSIS — Z86711 Personal history of pulmonary embolism: Secondary | ICD-10-CM | POA: Diagnosis not present

## 2020-03-09 DIAGNOSIS — M47814 Spondylosis without myelopathy or radiculopathy, thoracic region: Secondary | ICD-10-CM | POA: Diagnosis not present

## 2020-03-09 DIAGNOSIS — J9601 Acute respiratory failure with hypoxia: Secondary | ICD-10-CM | POA: Diagnosis not present

## 2020-03-09 DIAGNOSIS — K2900 Acute gastritis without bleeding: Secondary | ICD-10-CM | POA: Diagnosis not present

## 2020-03-09 DIAGNOSIS — D6859 Other primary thrombophilia: Secondary | ICD-10-CM | POA: Diagnosis not present

## 2020-03-09 DIAGNOSIS — Z7951 Long term (current) use of inhaled steroids: Secondary | ICD-10-CM | POA: Diagnosis not present

## 2020-03-09 DIAGNOSIS — J9602 Acute respiratory failure with hypercapnia: Secondary | ICD-10-CM | POA: Diagnosis not present

## 2020-03-09 DIAGNOSIS — M545 Low back pain: Secondary | ICD-10-CM | POA: Diagnosis not present

## 2020-03-09 DIAGNOSIS — D509 Iron deficiency anemia, unspecified: Secondary | ICD-10-CM | POA: Diagnosis not present

## 2020-03-11 ENCOUNTER — Ambulatory Visit: Payer: Medicare Other | Admitting: *Deleted

## 2020-03-11 DIAGNOSIS — I272 Pulmonary hypertension, unspecified: Secondary | ICD-10-CM | POA: Diagnosis not present

## 2020-03-11 DIAGNOSIS — D509 Iron deficiency anemia, unspecified: Secondary | ICD-10-CM | POA: Diagnosis not present

## 2020-03-11 DIAGNOSIS — I700 Atherosclerosis of aorta: Secondary | ICD-10-CM | POA: Diagnosis not present

## 2020-03-11 DIAGNOSIS — E785 Hyperlipidemia, unspecified: Secondary | ICD-10-CM | POA: Diagnosis not present

## 2020-03-11 DIAGNOSIS — I10 Essential (primary) hypertension: Secondary | ICD-10-CM

## 2020-03-11 DIAGNOSIS — Z9181 History of falling: Secondary | ICD-10-CM | POA: Diagnosis not present

## 2020-03-11 DIAGNOSIS — I110 Hypertensive heart disease with heart failure: Secondary | ICD-10-CM | POA: Diagnosis not present

## 2020-03-11 DIAGNOSIS — J9811 Atelectasis: Secondary | ICD-10-CM | POA: Diagnosis not present

## 2020-03-11 DIAGNOSIS — I0981 Rheumatic heart failure: Secondary | ICD-10-CM | POA: Diagnosis not present

## 2020-03-11 DIAGNOSIS — Z7902 Long term (current) use of antithrombotics/antiplatelets: Secondary | ICD-10-CM | POA: Diagnosis not present

## 2020-03-11 DIAGNOSIS — I5033 Acute on chronic diastolic (congestive) heart failure: Secondary | ICD-10-CM | POA: Diagnosis not present

## 2020-03-11 DIAGNOSIS — M47814 Spondylosis without myelopathy or radiculopathy, thoracic region: Secondary | ICD-10-CM | POA: Diagnosis not present

## 2020-03-11 DIAGNOSIS — G4733 Obstructive sleep apnea (adult) (pediatric): Secondary | ICD-10-CM

## 2020-03-11 DIAGNOSIS — I088 Other rheumatic multiple valve diseases: Secondary | ICD-10-CM | POA: Diagnosis not present

## 2020-03-11 DIAGNOSIS — Z86711 Personal history of pulmonary embolism: Secondary | ICD-10-CM | POA: Diagnosis not present

## 2020-03-11 DIAGNOSIS — Z9981 Dependence on supplemental oxygen: Secondary | ICD-10-CM | POA: Diagnosis not present

## 2020-03-11 DIAGNOSIS — J9601 Acute respiratory failure with hypoxia: Secondary | ICD-10-CM | POA: Diagnosis not present

## 2020-03-11 DIAGNOSIS — M545 Low back pain: Secondary | ICD-10-CM | POA: Diagnosis not present

## 2020-03-11 DIAGNOSIS — D6859 Other primary thrombophilia: Secondary | ICD-10-CM | POA: Diagnosis not present

## 2020-03-11 DIAGNOSIS — J9602 Acute respiratory failure with hypercapnia: Secondary | ICD-10-CM | POA: Diagnosis not present

## 2020-03-11 DIAGNOSIS — K2900 Acute gastritis without bleeding: Secondary | ICD-10-CM | POA: Diagnosis not present

## 2020-03-11 DIAGNOSIS — Z7951 Long term (current) use of inhaled steroids: Secondary | ICD-10-CM | POA: Diagnosis not present

## 2020-03-11 NOTE — Telephone Encounter (Addendum)
CMA was going to attempt another call to pt regarding her cpap-but pt has already been contacted by nurse (see note below copied from today's encounter)   Patient states she needs another CPAP mask as she lost the one she had before. She also says she needs a refill on her allopurinol.   Follow up plan: Verified that Frances Furbish has started physical therapy in the home. Patient states the first session was on 8/6. Messaged [employer] representatives that patient needs a new CPAP mask ASAP and that she is currently homebound due to her prolonged recent hospital stay. (7/27-8/5 at [facility] for acute on chronic respiratory failure.  Messaged Dr Oswaldo Done that patient is requesting refill on allopurinol she uses [name]. Ensured she is aware that she has an appointment in the clinic on 8/12 and that she has transportation as she wears O2. She says she will either call for handicapped transportation or her sister will provide transportation to the appointment.   No further needs identified a this time.  Cranford Mon RN, CCM, CDCES CCM Clinic RN Care Manager [phone]

## 2020-03-11 NOTE — Chronic Care Management (AMB) (Signed)
  Chronic Care Management   Note  03/11/2020 Name: Tina Chase MRN: [id number] DOB: [birth year]  Successful outreach to patient to follow up on Emmi general discharge Red Flag. (see below)     Patient states she needs another CPAP mask as she lost the one she had before. She also says she needs a refill on her allopurinol.   Follow up plan: Verified that Frances Furbish has started physical therapy in the home. Patient states the first session was on 8/6. Messaged [employer] representatives that patient needs a new CPAP mask ASAP and that she is currently homebound due to her prolonged recent hospital stay. (7/27-8/5 at [facility] for acute on chronic respiratory failure.  Messaged Dr Oswaldo Done that patient is requesting refill on allopurinol she uses [name]. Ensured she is aware that she has an appointment in the clinic on 8/12 and that she has transportation as she wears O2. She says she will either call for handicapped transportation or her sister will provide transportation to the appointment.   No further needs identified a this time.  Cranford Mon RN, CCM, CDCES CCM Clinic RN Care Manager [phone]

## 2020-03-11 NOTE — Progress Notes (Signed)
Internal Medicine Clinic Resident  I have personally reviewed this encounter including the documentation in this note and/or discussed this patient with the care management provider. I will address any urgent items identified by the care management provider and will communicate my actions to the patient's PCP. I have reviewed the patient's CCM visit with my supervising attending, Dr Criselda Peaches.  CCM messaged Dr. Oswaldo Done about refill for allopurinol. Patient has clinic appointment on 8/12.  Thalia Bloodgood, MD 03/11/2020

## 2020-03-13 DIAGNOSIS — G4733 Obstructive sleep apnea (adult) (pediatric): Secondary | ICD-10-CM | POA: Diagnosis not present

## 2020-03-14 ENCOUNTER — Encounter: Payer: Self-pay | Admitting: Internal Medicine

## 2020-03-14 ENCOUNTER — Other Ambulatory Visit: Payer: Self-pay

## 2020-03-14 ENCOUNTER — Ambulatory Visit (INDEPENDENT_AMBULATORY_CARE_PROVIDER_SITE_OTHER): Payer: Medicare Other | Admitting: Internal Medicine

## 2020-03-14 VITALS — BP 104/45 | HR 70 | Temp 99.2°F | Ht 63.0 in | Wt 294.8 lb

## 2020-03-14 DIAGNOSIS — J9601 Acute respiratory failure with hypoxia: Secondary | ICD-10-CM | POA: Diagnosis not present

## 2020-03-14 DIAGNOSIS — E790 Hyperuricemia without signs of inflammatory arthritis and tophaceous disease: Secondary | ICD-10-CM

## 2020-03-14 DIAGNOSIS — J9691 Respiratory failure, unspecified with hypoxia: Secondary | ICD-10-CM

## 2020-03-14 DIAGNOSIS — S301XXA Contusion of abdominal wall, initial encounter: Secondary | ICD-10-CM | POA: Diagnosis not present

## 2020-03-14 DIAGNOSIS — J9610 Chronic respiratory failure, unspecified whether with hypoxia or hypercapnia: Secondary | ICD-10-CM | POA: Diagnosis not present

## 2020-03-14 DIAGNOSIS — T8189XA Other complications of procedures, not elsewhere classified, initial encounter: Secondary | ICD-10-CM | POA: Diagnosis not present

## 2020-03-14 DIAGNOSIS — J9602 Acute respiratory failure with hypercapnia: Secondary | ICD-10-CM | POA: Diagnosis not present

## 2020-03-14 MED ORDER — ALLOPURINOL 300 MG PO TABS: 300.0000 mg | ORAL_TABLET | Freq: Every day | ORAL | 3 refills | Status: DC

## 2020-03-14 NOTE — Progress Notes (Addendum)
   CC: Post hospital follow-up  HPI:  Ms.Lyndi Shela [place] Rayon is a 79 y.o. 9 yoPast medical history of HTN, HLD, OSA, Pulmonary hypertension, increased arterial pressure recurrent PE, present for follow-up of recent hospitalization.  She had a long hospitalization for acute hypoxic, hypercapnic chronic respiratory failure due to acute pneumonia and also fluid overload.  Please refer to problem based charting for further details and assessment and plan.   Past Medical History:  Diagnosis Date  . Arthritis    left knee  and left shoulder   . Asthma   . 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 Diverticulosis   . Hyperlipidemia   . Hyperparathyroidism   . Hypertension   . IDA (iron deficiency anemia)   . Kidney atrophy   . Myocardial infarction (HCC)   . Obesity   . Pica in adults   . Pulmonary embolism (HCC)    Provoked 2013, Unprovoked 2017  . Pulmonary nodule    73mm, stable 2013 to 2017, benign  . Sleep apnea    CPAP  . Small bowel obstruction (HCC)    Review of Systems:  Review of Systems  Constitutional: Negative for chills and fever.  Respiratory: Positive for shortness of breath. Negative for cough and sputum production.    Physical Exam:  Vitals:   03/14/20 0935  BP: (!) 104/45  Pulse: 70  Temp: 99.2 F (37.3 C)  TempSrc: Oral  SpO2: 94%  Weight: 294 lb 12.8 oz (133.7 kg)  Height: 5\' 3"  (1.6 m)   Physical Exam Constitutional:      General: She is not in acute distress.    Appearance: Normal appearance. She is obese. She is not ill-appearing.  HENT:     Head: Normocephalic and atraumatic.  Cardiovascular:     Rate and Rhythm: Normal rate and regular rhythm.     Pulses: Normal pulses.     Heart sounds: Normal heart sounds.  Pulmonary:     Effort: Pulmonary effort is normal.     Breath sounds: Normal breath sounds. No wheezing or rales.  Abdominal:     Palpations: Abdomen is soft.     Tenderness: There is no abdominal tenderness.    Musculoskeletal:     Right lower leg: Edema present.     Left lower leg: Edema present.     Comments: Nonpitting edema around both ankles  Skin:    General: Skin is warm and dry.  Neurological:     General: No focal deficit present.     Mental Status: She is alert and oriented to person, place, and time.  Psychiatric:        Mood and Affect: Mood normal.        Behavior: Behavior normal.        Thought Content: Thought content normal.        Judgment: Judgment normal.     Assessment & Plan:   See Encounters Tab for problem based charting.  Patient discussed with Dr. 

## 2020-03-14 NOTE — Patient Instructions (Addendum)
Thank you for allowing [place] to provide your care today. You were here for hospital follow up. We are glad that you feel better.   Please use CPAP at night as soon as you receive it. Use supplemental Oxygen when you ambulate. (Your oxygen level was normal at rest but dropped to 86% at the end of ambulation) I refer you to a lung doctor to reassess you. Someone from their offic will call you for the appointment.  I have ordered some blood work for you. I will call if any are abnormal.    I send a refill for Allopurinol. Today we made no changes to your medications.    Please come back to clinic in 2 weeks to follow up with your primary care, Dr. Oswaldo Done or earlier if your symptoms get worse or not improved. As always, if having severe symptoms, please seek medical attention at emergency room. Should you have any questions or concerns please call the internal medicine clinic at [phone].    Thank you!

## 2020-03-15 ENCOUNTER — Encounter: Payer: Self-pay | Admitting: Internal Medicine

## 2020-03-15 DIAGNOSIS — J9691 Respiratory failure, unspecified with hypoxia: Secondary | ICD-10-CM | POA: Insufficient documentation

## 2020-03-15 LAB — BMP8+ANION GAP
Anion Gap: 12 mmol/L (ref 10.0–18.0)
BUN/Creatinine Ratio: 17 (ref 12–28)
BUN: 12 mg/dL (ref 8–27)
CO2: 34 mmol/L — ABNORMAL HIGH (ref 20–29)
Calcium: 10.2 mg/dL (ref 8.7–10.3)
Chloride: 95 mmol/L — ABNORMAL LOW (ref 96–106)
Creatinine, Ser: 0.7 mg/dL (ref 0.57–1.00)
GFR calc Af Amer: 97 mL/min/{1.73_m2} (ref >59)
GFR calc non Af Amer: 84 mL/min/{1.73_m2} (ref >59)
Glucose: 98 mg/dL (ref 65–99)
Potassium: 4.4 mmol/L (ref 3.5–5.2)
Sodium: 141 mmol/L (ref 134–144)

## 2020-03-15 LAB — CBC
Hematocrit: 39.4 % (ref 34.0–46.6)
Hemoglobin: 12.5 g/dL (ref 11.1–15.9)
MCH: 30.6 pg (ref 26.6–33.0)
MCHC: 31.7 g/dL (ref 31.5–35.7)
MCV: 97 fL (ref 79–97)
Platelets: 303 10*3/uL (ref 150–450)
RBC: 4.08 x10E6/uL (ref 3.77–5.28)
RDW: 13.4 % (ref 11.7–15.4)
WBC: 7.4 10*3/uL (ref 3.4–10.8)

## 2020-03-15 NOTE — Assessment & Plan Note (Addendum)
Patient presented to [facility] today for follow-up of recent hospitalization for acute hypoxic and hypercapnic respiratory failure, (multifactoprial, and 2/2 to pneumonia, volume overload and also in setting of OSA). Required intubation and also had a prolonged hospitalization. After improvement, she was discharged home with supplemental oxygen. Currently doing better. No worsening of shortness of breath. Still wearing 2 L of oxygen as she was told to use at rest and with ambulation.  Lung exam with mild rhonchi, very mild bibasilar fine crackles but no evidence of volume overload. No pitting edema on her legs.  (She has nonpitting edema around her ankles that per chart review has been a chronic multifactorial issue)  Her oxygen saturation remained at 92-93% at room air at rest but it dropped to 86% after we ambulated patient in hallway. -Instructed patient to use supplemental oxygen and as needed at rest -Continue duresis with current dose of p.o. Lasix 80 mg QD -We will place ambulatory referral to pulmonology office -Checking CBC and BMP per admitting team request at discharge -Awaiting to receive CPAP machine.  Instructed to use CPAP at night as soon as she receives it -F/u in clinic in 2 weeks or sooner if needed

## 2020-03-15 NOTE — Assessment & Plan Note (Signed)
Sending refill for allopurinol.

## 2020-03-20 DIAGNOSIS — Z7902 Long term (current) use of antithrombotics/antiplatelets: Secondary | ICD-10-CM | POA: Diagnosis not present

## 2020-03-20 DIAGNOSIS — I0981 Rheumatic heart failure: Secondary | ICD-10-CM | POA: Diagnosis not present

## 2020-03-20 DIAGNOSIS — Z7951 Long term (current) use of inhaled steroids: Secondary | ICD-10-CM | POA: Diagnosis not present

## 2020-03-20 DIAGNOSIS — J9811 Atelectasis: Secondary | ICD-10-CM | POA: Diagnosis not present

## 2020-03-20 DIAGNOSIS — Z9181 History of falling: Secondary | ICD-10-CM | POA: Diagnosis not present

## 2020-03-20 DIAGNOSIS — J9602 Acute respiratory failure with hypercapnia: Secondary | ICD-10-CM | POA: Diagnosis not present

## 2020-03-20 DIAGNOSIS — M47814 Spondylosis without myelopathy or radiculopathy, thoracic region: Secondary | ICD-10-CM | POA: Diagnosis not present

## 2020-03-20 DIAGNOSIS — E785 Hyperlipidemia, unspecified: Secondary | ICD-10-CM | POA: Diagnosis not present

## 2020-03-20 DIAGNOSIS — D509 Iron deficiency anemia, unspecified: Secondary | ICD-10-CM | POA: Diagnosis not present

## 2020-03-20 DIAGNOSIS — J9601 Acute respiratory failure with hypoxia: Secondary | ICD-10-CM | POA: Diagnosis not present

## 2020-03-20 DIAGNOSIS — K2900 Acute gastritis without bleeding: Secondary | ICD-10-CM | POA: Diagnosis not present

## 2020-03-20 DIAGNOSIS — I110 Hypertensive heart disease with heart failure: Secondary | ICD-10-CM | POA: Diagnosis not present

## 2020-03-20 DIAGNOSIS — D6859 Other primary thrombophilia: Secondary | ICD-10-CM | POA: Diagnosis not present

## 2020-03-20 DIAGNOSIS — G4733 Obstructive sleep apnea (adult) (pediatric): Secondary | ICD-10-CM | POA: Diagnosis not present

## 2020-03-20 DIAGNOSIS — Z9981 Dependence on supplemental oxygen: Secondary | ICD-10-CM | POA: Diagnosis not present

## 2020-03-20 DIAGNOSIS — I088 Other rheumatic multiple valve diseases: Secondary | ICD-10-CM | POA: Diagnosis not present

## 2020-03-20 DIAGNOSIS — I5033 Acute on chronic diastolic (congestive) heart failure: Secondary | ICD-10-CM | POA: Diagnosis not present

## 2020-03-20 DIAGNOSIS — I700 Atherosclerosis of aorta: Secondary | ICD-10-CM | POA: Diagnosis not present

## 2020-03-20 DIAGNOSIS — I272 Pulmonary hypertension, unspecified: Secondary | ICD-10-CM | POA: Diagnosis not present

## 2020-03-20 DIAGNOSIS — Z86711 Personal history of pulmonary embolism: Secondary | ICD-10-CM | POA: Diagnosis not present

## 2020-03-20 DIAGNOSIS — M545 Low back pain: Secondary | ICD-10-CM | POA: Diagnosis not present

## 2020-03-20 NOTE — Progress Notes (Signed)
Internal Medicine Clinic Attending  Case discussed with Dr. Masoudi at the time of the visit.  We reviewed the resident's history and exam and pertinent patient test results.  I agree with the assessment, diagnosis, and plan of care documented in the resident's note.  Trulee Hamstra, M.D., Ph.D.  

## 2020-03-25 ENCOUNTER — Other Ambulatory Visit: Payer: Self-pay | Admitting: Student in an Organized Health Care Education/Training Program

## 2020-03-26 ENCOUNTER — Other Ambulatory Visit: Payer: Self-pay | Admitting: Student in an Organized Health Care Education/Training Program

## 2020-03-27 DIAGNOSIS — Z7951 Long term (current) use of inhaled steroids: Secondary | ICD-10-CM | POA: Diagnosis not present

## 2020-03-27 DIAGNOSIS — I5033 Acute on chronic diastolic (congestive) heart failure: Secondary | ICD-10-CM | POA: Diagnosis not present

## 2020-03-27 DIAGNOSIS — K2900 Acute gastritis without bleeding: Secondary | ICD-10-CM | POA: Diagnosis not present

## 2020-03-27 DIAGNOSIS — J9602 Acute respiratory failure with hypercapnia: Secondary | ICD-10-CM | POA: Diagnosis not present

## 2020-03-27 DIAGNOSIS — I272 Pulmonary hypertension, unspecified: Secondary | ICD-10-CM | POA: Diagnosis not present

## 2020-03-27 DIAGNOSIS — Z86711 Personal history of pulmonary embolism: Secondary | ICD-10-CM | POA: Diagnosis not present

## 2020-03-27 DIAGNOSIS — J9811 Atelectasis: Secondary | ICD-10-CM | POA: Diagnosis not present

## 2020-03-27 DIAGNOSIS — I0981 Rheumatic heart failure: Secondary | ICD-10-CM | POA: Diagnosis not present

## 2020-03-27 DIAGNOSIS — Z7902 Long term (current) use of antithrombotics/antiplatelets: Secondary | ICD-10-CM | POA: Diagnosis not present

## 2020-03-27 DIAGNOSIS — D6859 Other primary thrombophilia: Secondary | ICD-10-CM | POA: Diagnosis not present

## 2020-03-27 DIAGNOSIS — Z9981 Dependence on supplemental oxygen: Secondary | ICD-10-CM | POA: Diagnosis not present

## 2020-03-27 DIAGNOSIS — I088 Other rheumatic multiple valve diseases: Secondary | ICD-10-CM | POA: Diagnosis not present

## 2020-03-27 DIAGNOSIS — M545 Low back pain: Secondary | ICD-10-CM | POA: Diagnosis not present

## 2020-03-27 DIAGNOSIS — D509 Iron deficiency anemia, unspecified: Secondary | ICD-10-CM | POA: Diagnosis not present

## 2020-03-27 DIAGNOSIS — Z9181 History of falling: Secondary | ICD-10-CM | POA: Diagnosis not present

## 2020-03-27 DIAGNOSIS — I700 Atherosclerosis of aorta: Secondary | ICD-10-CM | POA: Diagnosis not present

## 2020-03-27 DIAGNOSIS — J9601 Acute respiratory failure with hypoxia: Secondary | ICD-10-CM | POA: Diagnosis not present

## 2020-03-27 DIAGNOSIS — G4733 Obstructive sleep apnea (adult) (pediatric): Secondary | ICD-10-CM | POA: Diagnosis not present

## 2020-03-27 DIAGNOSIS — M47814 Spondylosis without myelopathy or radiculopathy, thoracic region: Secondary | ICD-10-CM | POA: Diagnosis not present

## 2020-03-27 DIAGNOSIS — I110 Hypertensive heart disease with heart failure: Secondary | ICD-10-CM | POA: Diagnosis not present

## 2020-03-27 DIAGNOSIS — E785 Hyperlipidemia, unspecified: Secondary | ICD-10-CM | POA: Diagnosis not present

## 2020-03-28 ENCOUNTER — Encounter: Payer: Self-pay | Admitting: Internal Medicine

## 2020-03-28 ENCOUNTER — Other Ambulatory Visit: Payer: Self-pay

## 2020-03-28 ENCOUNTER — Ambulatory Visit (INDEPENDENT_AMBULATORY_CARE_PROVIDER_SITE_OTHER): Payer: Medicare Other | Admitting: Internal Medicine

## 2020-03-28 DIAGNOSIS — I10 Essential (primary) hypertension: Secondary | ICD-10-CM | POA: Diagnosis not present

## 2020-03-28 DIAGNOSIS — D6859 Other primary thrombophilia: Secondary | ICD-10-CM

## 2020-03-28 DIAGNOSIS — J9691 Respiratory failure, unspecified with hypoxia: Secondary | ICD-10-CM

## 2020-03-28 NOTE — Progress Notes (Signed)
   CC: Follow-up of acute respiratory failure for hypoxia  HPI:  [name] is a 79 y.o. female with PMHx as documented below, presented for follow up of PNA and respiratory failure. Please refer to problem based charting for further details and assessment and plan of current problem and chronic medical conditions.  Medical history: HTN, asthma, OSA, erosive gastritis, single kidney, iron deficiency, hypercoagulable state, hyper uricemia, obesity  Medications: Albuterol, Flovent 2 puffs daily, allopurinol 300 mg daily, amlodipine 10, Coreg 25 mg twice daily, Lasix 80 mg daily, spironolactone 25 mg daily, Protonix 40, Xarelto 10  Past Medical History:  Diagnosis Date  . Arthritis    left knee  and left shoulder   . Asthma   . 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 Diverticulosis   . Hyperlipidemia   . Hyperparathyroidism   . Hypertension   . IDA (iron deficiency anemia)   . Kidney atrophy   . Myocardial infarction (HCC)   . Obesity   . Pica in adults   . Pulmonary embolism (HCC)    Provoked 2013, Unprovoked 2017  . Pulmonary nodule    74mm, stable 2013 to 2017, benign  . Sleep apnea    CPAP  . Small bowel obstruction (HCC)    ROS: Constitutional: Negative for chills and fever.  Respiratory: Positive for mild shortness of breath.  (improved) Cardiovascular: Negative for chest pain and leg swelling.  Gastrointestinal: Negative for abdominal pain, nausea and vomiting.  Neurological: Negative for dizziness and headaches.   .Physical Exam:  Vitals:   03/28/20 0921  BP: 128/63  Pulse: 69  Temp: 98 F (36.7 C)  TempSrc: Oral  SpO2: 97%  Weight: 291 lb 9.6 oz (132.3 kg)  Height: 5\' 1"  (1.549 m)     Constitutional: Obese lady. No acute distress.  Head: Normocephalic and atraumatic.  Cardiovascular:  RRR, nl S1S2, no murmur, Nonpitting edema around both ankles  Respiratory: Effort normal and breath sounds normal. No respiratory distress. No  wheezes.  GI: Soft. Bowel sounds are normal. No distension. There is no tenderness.  Neurological: Is alert and oriented x 3  Skin: Not diaphoretic. No erythema.  Psychiatric: Normal mood and affect. Behavior is normal. Judgment and thought content normal.    Assessment & Plan:   See Encounters Tab for problem based charting.  Patient seen with Dr. 

## 2020-03-28 NOTE — Patient Instructions (Signed)
Thank you for allowing [place] to provide your care today.  We are glad that your shortness of breath is much better.  Please use oxygen only as needed and to keep your oxygen level more than 92%.  Use C PAP at night. (As we discussed, you do not have to use nasal oxygen at the same time with CPAP at night).  Today we did not change your medications. Please take them as before and call us if you have any question or concern.   Please follow up with Dr. Oswaldo Done as scheduled for you in October.  As always, if having severe symptoms, please seek medical attention at emergency room. Should you have any questions or concerns please call the internal medicine clinic at [phone].    Thank you!

## 2020-03-31 ENCOUNTER — Encounter: Payer: Self-pay | Admitting: Internal Medicine

## 2020-03-31 NOTE — Assessment & Plan Note (Addendum)
This is the second follow up visit after she dicharged from hospital. She was hospitalized for acute hypoxia in setting of PNA and also volume overload. We see significant improvement today. She is doing much better. Supplemental O2 requirement improved. She finaly received CPAP machine and uses that at night regularly.   She is saturating well at RA now and tolerates activity well. Lung exam is unremarkable w/o crackles. Volume status is OK. She continues to improve. Will use nasal O2 only as needed.  She has a pulse oxymetry at home. -As needed supplemental nasal O2 to keep O2 sat>92% at home -Continue current dose of Lasix (80 mg QD) -Continue CPAP at night

## 2020-03-31 NOTE — Progress Notes (Signed)
Internal Medicine Clinic Attending  I saw and evaluated the patient.  I personally confirmed the key portions of the history and exam documented by Dr. Masoudi and I reviewed pertinent patient test results.  The assessment, diagnosis, and plan were formulated together and I agree with the documentation in the resident's note. 

## 2020-03-31 NOTE — Assessment & Plan Note (Signed)
Patient BP is 128/63. Mildly higher than goal but we will continue current antihypertensive medications. Of note, there has been limitation for antihypertensive choices given single kidney, Hx of nephrolithiasis and hyperuricemia.

## 2020-03-31 NOTE — Assessment & Plan Note (Signed)
On Xarelto 10 mg QD.  Patient denies any bleeding.

## 2020-04-03 DIAGNOSIS — E785 Hyperlipidemia, unspecified: Secondary | ICD-10-CM | POA: Diagnosis not present

## 2020-04-03 DIAGNOSIS — I5033 Acute on chronic diastolic (congestive) heart failure: Secondary | ICD-10-CM | POA: Diagnosis not present

## 2020-04-03 DIAGNOSIS — J9601 Acute respiratory failure with hypoxia: Secondary | ICD-10-CM | POA: Diagnosis not present

## 2020-04-03 DIAGNOSIS — M545 Low back pain: Secondary | ICD-10-CM | POA: Diagnosis not present

## 2020-04-03 DIAGNOSIS — Z86711 Personal history of pulmonary embolism: Secondary | ICD-10-CM | POA: Diagnosis not present

## 2020-04-03 DIAGNOSIS — Z9981 Dependence on supplemental oxygen: Secondary | ICD-10-CM | POA: Diagnosis not present

## 2020-04-03 DIAGNOSIS — I272 Pulmonary hypertension, unspecified: Secondary | ICD-10-CM | POA: Diagnosis not present

## 2020-04-03 DIAGNOSIS — I0981 Rheumatic heart failure: Secondary | ICD-10-CM | POA: Diagnosis not present

## 2020-04-03 DIAGNOSIS — I110 Hypertensive heart disease with heart failure: Secondary | ICD-10-CM | POA: Diagnosis not present

## 2020-04-03 DIAGNOSIS — I088 Other rheumatic multiple valve diseases: Secondary | ICD-10-CM | POA: Diagnosis not present

## 2020-04-03 DIAGNOSIS — G4733 Obstructive sleep apnea (adult) (pediatric): Secondary | ICD-10-CM | POA: Diagnosis not present

## 2020-04-03 DIAGNOSIS — I700 Atherosclerosis of aorta: Secondary | ICD-10-CM | POA: Diagnosis not present

## 2020-04-03 DIAGNOSIS — J9811 Atelectasis: Secondary | ICD-10-CM | POA: Diagnosis not present

## 2020-04-03 DIAGNOSIS — Z7951 Long term (current) use of inhaled steroids: Secondary | ICD-10-CM | POA: Diagnosis not present

## 2020-04-03 DIAGNOSIS — J9602 Acute respiratory failure with hypercapnia: Secondary | ICD-10-CM | POA: Diagnosis not present

## 2020-04-03 DIAGNOSIS — Z7902 Long term (current) use of antithrombotics/antiplatelets: Secondary | ICD-10-CM | POA: Diagnosis not present

## 2020-04-03 DIAGNOSIS — D6859 Other primary thrombophilia: Secondary | ICD-10-CM | POA: Diagnosis not present

## 2020-04-03 DIAGNOSIS — D509 Iron deficiency anemia, unspecified: Secondary | ICD-10-CM | POA: Diagnosis not present

## 2020-04-03 DIAGNOSIS — M47814 Spondylosis without myelopathy or radiculopathy, thoracic region: Secondary | ICD-10-CM | POA: Diagnosis not present

## 2020-04-03 DIAGNOSIS — K2900 Acute gastritis without bleeding: Secondary | ICD-10-CM | POA: Diagnosis not present

## 2020-04-03 DIAGNOSIS — Z9181 History of falling: Secondary | ICD-10-CM | POA: Diagnosis not present

## 2020-04-09 ENCOUNTER — Other Ambulatory Visit: Payer: Self-pay | Admitting: *Deleted

## 2020-04-09 DIAGNOSIS — I10 Essential (primary) hypertension: Secondary | ICD-10-CM

## 2020-04-09 NOTE — Telephone Encounter (Signed)
Patient requesting refill of Spironolactone w5 mg called into [facility].

## 2020-04-12 MED ORDER — SPIRONOLACTONE 25 MG PO TABS: 25.0000 mg | ORAL_TABLET | Freq: Every day | ORAL | 0 refills | Status: DC

## 2020-04-14 DIAGNOSIS — S301XXA Contusion of abdominal wall, initial encounter: Secondary | ICD-10-CM | POA: Diagnosis not present

## 2020-04-14 DIAGNOSIS — T8189XA Other complications of procedures, not elsewhere classified, initial encounter: Secondary | ICD-10-CM | POA: Diagnosis not present

## 2020-04-14 DIAGNOSIS — J9610 Chronic respiratory failure, unspecified whether with hypoxia or hypercapnia: Secondary | ICD-10-CM | POA: Diagnosis not present

## 2020-04-14 DIAGNOSIS — J9601 Acute respiratory failure with hypoxia: Secondary | ICD-10-CM | POA: Diagnosis not present

## 2020-04-29 ENCOUNTER — Ambulatory Visit (INDEPENDENT_AMBULATORY_CARE_PROVIDER_SITE_OTHER): Payer: Medicare Other | Admitting: Pulmonary Disease

## 2020-04-29 ENCOUNTER — Other Ambulatory Visit: Payer: Self-pay

## 2020-04-29 ENCOUNTER — Encounter: Payer: Self-pay | Admitting: Pulmonary Disease

## 2020-04-29 VITALS — BP 122/62 | HR 78 | Temp 97.3°F | Ht 62.25 in | Wt 289.4 lb

## 2020-04-29 DIAGNOSIS — J9691 Respiratory failure, unspecified with hypoxia: Secondary | ICD-10-CM | POA: Diagnosis not present

## 2020-04-29 DIAGNOSIS — J4520 Mild intermittent asthma, uncomplicated: Secondary | ICD-10-CM

## 2020-04-29 DIAGNOSIS — G4733 Obstructive sleep apnea (adult) (pediatric): Secondary | ICD-10-CM

## 2020-04-29 MED ORDER — ALBUTEROL SULFATE (2.5 MG/3ML) 0.083% IN NEBU: 2.5000 mg | INHALATION_SOLUTION | Freq: Four times a day (QID) | RESPIRATORY_TRACT | 12 refills | Status: DC | PRN

## 2020-04-29 NOTE — Progress Notes (Signed)
Patient ID: Tina Chase, female    DOB: July 29, 1941, 79 y.o.   MRN: [id number]  Chief Complaint  Patient presents with  . Consult    respiratory failure 93-95% when you wake up no symptoms    Referring provider: Oda Kilts, MD  HPI:  Ms. Tina Chase is a 79 year old woman with past medical history of PE x2 (most recent 2017) on anticoagulation, asthma, severe OSA on CPAP who we are seeing in consultation at the request of Karenann Cai MD for evaluation of hypoxia respiratory failure.  Notes from referring provider and recent hospitalization reviewed.  Patient was sent to the hospital in July.  Supposedly she was confused, not herself.  Difficulty waking up.  In the ED she was lethargic with menstrual blood gas with respiratory acidosis with PCO2 112.  She was intubated for airway protection.  CTA revealed no PE but did demonstrate on my interpretation bilateral lower lobe consolidation as well as posterior right upper lobe consolidations consistent with pneumonia, likely aspiration event in setting of CO2 narcosis.  She was treated with antibiotics.  In addition, she was given Lasix for volume overload.  BNP was elevated and subsequently within normal limits after diuresis.  Her respiratory status improved.  She was extubated.  She was discharged on 2 L nasal cannula continuously.  On follow-up with PCP her oxygen saturation was noted to be improved.  She follow-up again with PCP was noted to have oxygen saturation greater 90%.  Instructed to use oxygen as needed.  She is only checked her oxygen at home at rest but usually above 90%.  Has noted 1 instance of 89%.  Today, she says she feels much improved since hospitalization.  She denies any significant respiratory symptoms.  No cough.  She has baseline dyspnea on exertion that is stable to improved.  She notes that her dyspnea gets better the more she walks or exercises.  She feels her lower extremity swelling is much better than  before.  She has asthma diagnosed in the late 30s early 47s when living in [place].  Well-controlled on low-dose Flovent and albuterol as needed.  She is never smoker.   PMH: Asthma, OHS, PE, diastolic dysfunction Surgical history: Hernia repair, kidney cyst removal, nephrectomy, tubal ligation Family history: CAD in father, CAD in brother Social history: Lives in [place], never smoker, family lives in area   Questionaires / Pulmonary Flowsheets:   ACT:  No flowsheet data found.  MMRC: No flowsheet data found.  Epworth:  No flowsheet data found.  Tests:   FENO:  No results found for: NITRICOXIDE  PFT: No flowsheet data found.  WALK:  No flowsheet data found.  Imaging: Reviewed and as per EMR and discussion of this note  Lab Results: Reviewed and as per EMR CBC    Component Value Date/Time   WBC 7.4 03/14/2020 1130   WBC 10.2 03/07/2020 0408   RBC 4.08 03/14/2020 1130   RBC 3.72 (L) 03/07/2020 0408   HGB 12.5 03/14/2020 1130   HCT 39.4 03/14/2020 1130   PLT 303 03/14/2020 1130   MCV 97 03/14/2020 1130   MCH 30.6 03/14/2020 1130   MCH 30.6 03/07/2020 0408   MCHC 31.7 03/14/2020 1130   MCHC 30.8 03/07/2020 0408   RDW 13.4 03/14/2020 1130   LYMPHSABS 1.8 03/06/2020 0343   MONOABS 1.3 (H) 03/06/2020 0343   EOSABS 0.4 03/06/2020 0343   BASOSABS 0.0 03/06/2020 0343    BMET    Component  Value Date/Time   NA 141 03/14/2020 1130   K 4.4 03/14/2020 1130   CL 95 (L) 03/14/2020 1130   CO2 34 (H) 03/14/2020 1130   GLUCOSE 98 03/14/2020 1130   GLUCOSE 103 (H) 03/07/2020 0408   BUN 12 03/14/2020 1130   CREATININE 0.70 03/14/2020 1130   CREATININE 0.93 02/14/2015 0953   CALCIUM 10.2 03/14/2020 1130   CALCIUM 10.5 06/10/2009 1342   GFRNONAA 84 03/14/2020 1130   GFRNONAA 62 02/14/2015 0953   GFRAA 97 03/14/2020 1130   GFRAA 71 02/14/2015 0953    BNP    Component Value Date/Time   BNP 32.4 03/07/2020 0411    ProBNP    Component Value  Date/Time   PROBNP 1,441.0 (H) 12/16/2011 0456    Specialty Problems      Pulmonary Problems   Asthma   Sleep apnea   Respiratory failure with hypoxia (HCC)      Allergies  Allergen Reactions  . Hydralazine Anaphylaxis and Other (See Comments)    Swelling tongue.  Marland Kitchen Lisinopril Swelling and Other (See Comments)    REACTION: angioedema Tongue swelling  . Aspirin Other (See Comments)    REACTION: hives  . Crab [Shellfish Allergy] Hives and Other (See Comments)    Can eat shrimp and fish and lobster    Immunization History  Administered Date(s) Administered  . Influenza Split 08/21/2011  . Influenza Whole 06/02/2010  . Influenza, Seasonal, Injecte, Preservative Fre 07/21/2012  . Influenza,inj,Quad PF,6+ Mos 04/10/2014, 07/08/2015, 03/30/2016, 05/10/2017, 05/23/2018, 05/15/2019  . PFIZER SARS-COV-2 Vaccination 11/10/2019, 12/01/2019  . Pneumococcal Conjugate-13 07/08/2015  . Pneumococcal Polysaccharide-23 07/01/2009  . Td 09/23/2009    Past Medical History:  Diagnosis Date  . Arthritis    left knee  and left shoulder   . Asthma   . 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 Diverticulosis   . Hyperlipidemia   . Hyperparathyroidism   . Hypertension   . IDA (iron deficiency anemia)   . Kidney atrophy   . Myocardial infarction [facility])    patient denies  . Obesity   . Pica in adults   . Pulmonary embolism ([facility])    Provoked 2013, Unprovoked 2017  . Pulmonary nodule    19m, stable 2013 to 2017, benign  . Sleep apnea    CPAP  . Small bowel obstruction (HCC)     Tobacco History: Social History   Tobacco Use  Smoking Status Former Smoker  . Packs/day: 0.25  . Years: 3.00  . Pack years: 0.75  . Types: Cigarettes  . Quit date: 08/03/1968  . Years since quitting: 51.7  Smokeless Tobacco Never Used   Counseling given: Not Answered   Continue to not smoke  Outpatient Encounter Medications as of 04/29/2020  Medication Sig  . acetaminophen  (TYLENOL) 325 MG tablet Take 975 mg by mouth every 6 (six) hours as needed for moderate pain or headache.  . albuterol (VENTOLIN HFA) 108 (90 Base) MCG/ACT inhaler INHALE 2 PUFFS INTO THE LUNGS EVERY 4 HOURS AS NEEDED FOR WHEEZING OR SHORTNESS OF BREATH  . allopurinol (ZYLOPRIM) 300 MG tablet Take 1 tablet (300 mg total) by mouth at bedtime.  .Marland KitchenamLODipine (NORVASC) 10 MG tablet Take 1 tablet (10 mg total) by mouth daily. (Patient taking differently: Take 10 mg by mouth at bedtime. )  . carvedilol (COREG) 25 MG tablet Take 1 tablet (25 mg total) by mouth 2 (two) times daily.  .Marland KitchenFLOVENT HFA 44 MCG/ACT  inhaler Inhale 2 puffs into the lungs daily.  . furosemide (LASIX) 80 MG tablet Take 1 tablet (80 mg total) by mouth daily.  . Guaifenesin (MUCINEX MAXIMUM STRENGTH) 1200 MG TB12 Take 1 tablet by mouth daily.  . iron polysaccharides (NU-IRON) 150 MG capsule Take 1 capsule (150 mg total) by mouth daily.  Marland Kitchen loperamide (IMODIUM) 2 MG capsule TAKE 1 CAPSULE BY MOUTH  EVERY 6 HOURS AS NEEDED FOR DIARRHEA OR LOOSE STOOLS (Patient taking differently: Take 2 mg by mouth every 6 (six) hours as needed for diarrhea or loose stools. )  . rivaroxaban (XARELTO) 10 MG TABS tablet Take 1 tablet (10 mg total) by mouth daily at 10 pm.  . spironolactone (ALDACTONE) 25 MG tablet Take 1 tablet (25 mg total) by mouth daily.  . pantoprazole (PROTONIX) 40 MG tablet Take 1 tablet (40 mg total) by mouth daily. (Patient not taking: Reported on 04/29/2020)  . [DISCONTINUED] Multiple Vitamins-Minerals (PRESERVISION AREDS 2 PO) Take 2 tablets by mouth daily. (Patient not taking: Reported on 04/29/2020)  . [DISCONTINUED] Sodium Sulfate-Mag Sulfate-KCl (SUTAB) [phone] MG TABS Take 1 kit by mouth as directed. BIN: [id number] PCN: CN GROUP: [id number] MEMBER ID: [id number]; DO NOT RUN AS CASH   No facility-administered encounter medications on file as of 04/29/2020.     Review of Systems  Review of Systems  The chest pain on  exertion.  Sleeping better while using CPAP.  Comprehensive review of systems otherwise negative.  Physical Exam  BP 122/62 (BP Location: Right Wrist, Cuff Size: Normal)   Pulse 78   Temp (!) 97.3 F (36.3 C) (Temporal)   Ht 5' 2.25" (1.581 m)   Wt 289 lb 6.4 oz (131.3 kg)   LMP 09/23/1986   SpO2 94%   BMI 52.51 kg/m   Wt Readings from Last 5 Encounters:  04/29/20 289 lb 6.4 oz (131.3 kg)  03/28/20 291 lb 9.6 oz (132.3 kg)  03/14/20 294 lb 12.8 oz (133.7 kg)  03/07/20 297 lb 2.9 oz (134.8 kg)  02/26/20 (!) 296 lb 15.4 oz (134.7 kg)    BMI Readings from Last 5 Encounters:  04/29/20 52.51 kg/m  03/28/20 55.10 kg/m  03/14/20 52.22 kg/m  03/07/20 56.15 kg/m  02/26/20 56.11 kg/m     Physical Exam General: Sitting up in exam chair, no acute distress Eyes: EOMI, no icterus Neck: JVP difficult to assess given habitus, supple Respiratory: Clear to auscultate bilaterally, no wheeze Cardiovascular: Regular rhythm, no murmurs Abdomen: Obese, bowel sounds present Extremities: Significant nonpitting edema bilaterally stable to improved per report, warm MSK: No joint effusion, no synovitis Neuro: Normal gait, no weakness Psych: Normal mood, full affect   Assessment & Plan:   Acute hypoxic respiratory failure: Resolved.  Saturations maintained in the low 90s with walk in office today.  Saturation acceptable when resting.  Combination of pneumonia and volume overload improved with Lasix and treatment of pneumonia/aspiration pneumonia.  Suspect slow accumulation of fluid in the setting of CPAP nonadherence prior to admission.  She certainly has evidence of chronic CO2 retention based on elevated bicarb is on BMPs as well as compensated respiratory acidosis on blood gases.  Likely OHS physiology and at risk for developing chronic hypoxemic respiratory failure in the future without weight loss.  She is congratulated on 5 pound weight loss for the last 6 weeks.    Asthma: Childhood  symptoms diagnosed later in life when lived in [place].  Symptoms seem well controlled on low-dose Flovent and albuterol as needed.  This is to continue.  At her request, nebulizer machine and nebulized albuterol provided for use at home as needed. Will obtain PFTs next visit.  Severe sleep apnea.  AHI reportedly greater than 120.  CPAP not adherent for months prior to hospitalization.  Stressed importance of CPAP adherence for keeping her heart and oxygen levels safe.  She has been adherent since discharge.  She plans to continue to use.  Strongly recommend she use this every night as expect she accumulated fluid in the setting of CPAP nonadherence leading to CO2 narcosis and likely aspiration pneumonia for which she was hospitalized.  Return in about 3 months (around 07/29/2020).   Lanier Clam, MD 04/29/2020

## 2020-04-29 NOTE — Patient Instructions (Addendum)
Nice to meet you!  Based on walking in the office today, you do not need any oxygen at rest or when walking.   Continue the flovent and albuterol for asthma.  Come back in 3 months for full PFTs (breathing tests) and to follow up with Dr. Judeth Horn.

## 2020-04-30 ENCOUNTER — Other Ambulatory Visit: Payer: Self-pay

## 2020-04-30 DIAGNOSIS — J9601 Acute respiratory failure with hypoxia: Secondary | ICD-10-CM | POA: Diagnosis not present

## 2020-04-30 DIAGNOSIS — J4520 Mild intermittent asthma, uncomplicated: Secondary | ICD-10-CM | POA: Diagnosis not present

## 2020-04-30 MED ORDER — GUAIFENESIN ER 1200 MG PO TB12: 1.0000 | ORAL_TABLET | Freq: Every day | ORAL | 0 refills | Status: DC

## 2020-04-30 MED ORDER — POLYSACCHARIDE IRON COMPLEX 150 MG PO CAPS: 150.0000 mg | ORAL_CAPSULE | Freq: Every day | ORAL | 1 refills | Status: DC

## 2020-04-30 NOTE — Telephone Encounter (Signed)
iron polysaccharides (NU-IRON) 150 MG capsule  Guaifenesin (MUCINEX MAXIMUM STRENGTH) 1200 MG TB12, REFILL REQUEST @  [name] - [place], [place] - [address] Phone:  [phone]  Fax:  [phone]

## 2020-05-06 ENCOUNTER — Other Ambulatory Visit: Payer: Self-pay

## 2020-05-06 ENCOUNTER — Ambulatory Visit (INDEPENDENT_AMBULATORY_CARE_PROVIDER_SITE_OTHER): Payer: Medicare Other | Admitting: Student in an Organized Health Care Education/Training Program

## 2020-05-06 ENCOUNTER — Encounter: Payer: Self-pay | Admitting: Student in an Organized Health Care Education/Training Program

## 2020-05-06 VITALS — BP 117/63 | HR 74 | Temp 98.3°F | Ht 61.0 in | Wt 289.0 lb

## 2020-05-06 DIAGNOSIS — G4733 Obstructive sleep apnea (adult) (pediatric): Secondary | ICD-10-CM | POA: Diagnosis not present

## 2020-05-06 DIAGNOSIS — D509 Iron deficiency anemia, unspecified: Secondary | ICD-10-CM

## 2020-05-06 DIAGNOSIS — I10 Essential (primary) hypertension: Secondary | ICD-10-CM

## 2020-05-06 DIAGNOSIS — E790 Hyperuricemia without signs of inflammatory arthritis and tophaceous disease: Secondary | ICD-10-CM

## 2020-05-06 DIAGNOSIS — D6859 Other primary thrombophilia: Secondary | ICD-10-CM

## 2020-05-06 DIAGNOSIS — Z23 Encounter for immunization: Secondary | ICD-10-CM

## 2020-05-06 MED ORDER — SPIRONOLACTONE 25 MG PO TABS: 25.0000 mg | ORAL_TABLET | Freq: Every day | ORAL | 3 refills | Status: DC

## 2020-05-06 NOTE — Patient Instructions (Signed)
Great seeing you today in clinic.  Please continue all your medications as usual.  Final check your iron levels today, this will help [place] determine if you need to continue taking iron supplements.  We talked about increasing exercise, watching your nutrition, and trying to focus on a little more weight loss.

## 2020-05-06 NOTE — Assessment & Plan Note (Signed)
Tolerating CPAP fairly well since hospitalization.  Followed up with Dr. Judeth Horn in the pulmonology clinic.  Still having some difficulty with the fit and some sensitivity of her skin.  At risk for not adhering in the future, but I think she is making her best effort.

## 2020-05-06 NOTE — Assessment & Plan Note (Signed)
Historically difficult to control hypertension due to contraindications for several antihypertensive medications.  Historically we have used amlodipine 10 mg and carvedilol 25 mg twice daily.  She was started on spironolactone 25 mg daily at the last hospitalization due to volume overload contributing to acute hypoxic respiratory failure.  Historically has been reluctant to use that medication because she was also on potassium citrate in order to prevent further nephrolithiasis.  However she seems to have done really well with this medication, her blood pressure is perfect today.  Her potassium levels have been in good range.  Is been many many years since she has had her severe nephrolithiasis event.  Considering this issue on balance, we are going to continue with the spironolactone because it is offering her significant benefit in terms of blood pressure control, which is renal protective as well.

## 2020-05-06 NOTE — Assessment & Plan Note (Signed)
Iron deficiency anemia since at least April of unclear cause.  Received a Feraheme infusion in May.  In July she went for upper and lower endoscopies, unfortunately had a complication afterwards of acute hypoxic respiratory failure likely related to may be aspiration events, hospitalized requiring ICU level care and ventilation for several days.  No source of bleeding was found on those endoscopies.  Symptomatically doing well today.  Continues to take iron supplements, but having some difficulty affording the polysaccharide formulation.  We will plan to check a CBC, ferritin, and iron panel today.  We will not subject her to further endoscopies, or other invasive procedures because she had such a bad reaction.  We will continue with iron supplementation, either oral or potentially another Feraheme infusion.

## 2020-05-06 NOTE — Assessment & Plan Note (Signed)
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  Has done well on allopurinol 300 mg daily without side effects, planning to check uric acid level today and keep level below 7.

## 2020-05-06 NOTE — Progress Notes (Signed)
   Assessment and Plan:  See Encounters tab for problem-based medical decision making.   __________________________________________________________  HPI:   79 year old person here for follow-up of hypertension.  Doing well with no specific complaints or problems today.  We reviewed her last hospitalization which required ICU level care and ventilation.  She is recovering okay, still having some weakness and soreness of her upper chest.  Independent in her activities of daily living.  Doing well at home by herself.  Reports good adherence with her medications without side effects.  No recent illnesses, no fevers or chills.  No recent falls.  Trying to get some exercise and get out of the house more often.  Doing good with her nutrition.  Has maintained a few pounds of weight loss.  __________________________________________________________  Problem List: Patient Active Problem List   Diagnosis Date Noted  . Hypercoagulable state (HCC)     Priority: High  . Sleep apnea 07/08/2015    Priority: High  . Bilateral lower extremity edema 01/09/2014    Priority: Medium  . Hyperuricemia 05/25/2013    Priority: Medium  . Nephrolithiasis 05/25/2013    Priority: Medium  . Hypertension 12/22/2011    Priority: Medium  . Obesity, Class III, BMI 40-49.9 (morbid obesity) (HCC) 12/22/2011    Priority: Medium  . Asthma 12/04/2011    Priority: Low  . 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 Routine health maintenance 09/15/2011    Priority: Low  . Iron deficiency anemia     Medications: Reconciled today in Epic __________________________________________________________  Physical Exam:  Vital Signs: Vitals:   05/06/20 0847  BP: 117/63  Pulse: 74  Temp: 98.3 F (36.8 C)  TempSrc: Oral  SpO2: 96%  Weight: 289 lb (131.1 kg)  Height: 5\' 1"  (1.549 m)    Gen: Well appearing, NAD  Neck: No cervical LAD, No thyromegaly or nodules, No JVD. CV: RRR, no murmurs Pulm: Normal effort, CTA  throughout, no wheezing  Ext: Warm, chronic lower extremity lymphedema with no pitting Skin: Chronic stasis changes of bilateral legs

## 2020-05-07 ENCOUNTER — Encounter: Payer: Self-pay | Admitting: Student in an Organized Health Care Education/Training Program

## 2020-05-07 LAB — CBC
Hematocrit: 38.7 % (ref 34.0–46.6)
Hemoglobin: 12.1 g/dL (ref 11.1–15.9)
MCH: 30.7 pg (ref 26.6–33.0)
MCHC: 31.3 g/dL — ABNORMAL LOW (ref 31.5–35.7)
MCV: 98 fL — ABNORMAL HIGH (ref 79–97)
Platelets: 247 10*3/uL (ref 150–450)
RBC: 3.94 x10E6/uL (ref 3.77–5.28)
RDW: 13.8 % (ref 11.7–15.4)
WBC: 7.6 10*3/uL (ref 3.4–10.8)

## 2020-05-07 LAB — IRON AND TIBC
Iron Saturation: 17 % (ref 15–55)
Iron: 58 ug/dL (ref 27–139)
Total Iron Binding Capacity: 348 ug/dL (ref 250–450)
UIBC: 290 ug/dL (ref 118–369)

## 2020-05-07 LAB — URIC ACID: Uric Acid: 3 mg/dL — ABNORMAL LOW (ref 3.1–7.9)

## 2020-05-07 LAB — FERRITIN: Ferritin: 140 ng/mL (ref 15–150)

## 2020-05-14 DIAGNOSIS — T8189XA Other complications of procedures, not elsewhere classified, initial encounter: Secondary | ICD-10-CM | POA: Diagnosis not present

## 2020-05-14 DIAGNOSIS — J9601 Acute respiratory failure with hypoxia: Secondary | ICD-10-CM | POA: Diagnosis not present

## 2020-05-14 DIAGNOSIS — J9610 Chronic respiratory failure, unspecified whether with hypoxia or hypercapnia: Secondary | ICD-10-CM | POA: Diagnosis not present

## 2020-05-14 DIAGNOSIS — S301XXA Contusion of abdominal wall, initial encounter: Secondary | ICD-10-CM | POA: Diagnosis not present

## 2020-05-22 ENCOUNTER — Other Ambulatory Visit: Payer: Self-pay | Admitting: Student in an Organized Health Care Education/Training Program

## 2020-06-03 DIAGNOSIS — J4520 Mild intermittent asthma, uncomplicated: Secondary | ICD-10-CM | POA: Diagnosis not present

## 2020-06-03 DIAGNOSIS — J9601 Acute respiratory failure with hypoxia: Secondary | ICD-10-CM | POA: Diagnosis not present

## 2020-06-11 DIAGNOSIS — J9610 Chronic respiratory failure, unspecified whether with hypoxia or hypercapnia: Secondary | ICD-10-CM | POA: Diagnosis not present

## 2020-06-11 DIAGNOSIS — G4733 Obstructive sleep apnea (adult) (pediatric): Secondary | ICD-10-CM | POA: Diagnosis not present

## 2020-06-14 DIAGNOSIS — S301XXA Contusion of abdominal wall, initial encounter: Secondary | ICD-10-CM | POA: Diagnosis not present

## 2020-06-14 DIAGNOSIS — J9601 Acute respiratory failure with hypoxia: Secondary | ICD-10-CM | POA: Diagnosis not present

## 2020-06-14 DIAGNOSIS — T8189XA Other complications of procedures, not elsewhere classified, initial encounter: Secondary | ICD-10-CM | POA: Diagnosis not present

## 2020-06-14 DIAGNOSIS — J9610 Chronic respiratory failure, unspecified whether with hypoxia or hypercapnia: Secondary | ICD-10-CM | POA: Diagnosis not present

## 2020-07-14 DIAGNOSIS — J9610 Chronic respiratory failure, unspecified whether with hypoxia or hypercapnia: Secondary | ICD-10-CM | POA: Diagnosis not present

## 2020-07-14 DIAGNOSIS — T8189XA Other complications of procedures, not elsewhere classified, initial encounter: Secondary | ICD-10-CM | POA: Diagnosis not present

## 2020-07-14 DIAGNOSIS — J9601 Acute respiratory failure with hypoxia: Secondary | ICD-10-CM | POA: Diagnosis not present

## 2020-07-14 DIAGNOSIS — S301XXA Contusion of abdominal wall, initial encounter: Secondary | ICD-10-CM | POA: Diagnosis not present

## 2020-07-19 ENCOUNTER — Other Ambulatory Visit: Payer: Self-pay | Admitting: Student in an Organized Health Care Education/Training Program

## 2020-07-19 DIAGNOSIS — R600 Localized edema: Secondary | ICD-10-CM

## 2020-07-29 ENCOUNTER — Ambulatory Visit (INDEPENDENT_AMBULATORY_CARE_PROVIDER_SITE_OTHER): Payer: Medicare Other | Admitting: Pulmonary Disease

## 2020-07-29 ENCOUNTER — Other Ambulatory Visit: Payer: Self-pay

## 2020-07-29 DIAGNOSIS — J9691 Respiratory failure, unspecified with hypoxia: Secondary | ICD-10-CM | POA: Diagnosis not present

## 2020-07-29 LAB — PULMONARY FUNCTION TEST
DL/VA % pred: 148 %
DL/VA: 6.19 ml/min/mmHg/L
DLCO cor % pred: 108 %
DLCO cor: 19.31 ml/min/mmHg
DLCO unc % pred: 108 %
DLCO unc: 19.31 ml/min/mmHg
FEF 25-75 Post: 1.14 L/sec
FEF 25-75 Pre: 1.17 L/sec
FEF2575-%Change-Post: -2 %
FEF2575-%Pred-Post: 88 %
FEF2575-%Pred-Pre: 91 %
FEV1-%Change-Post: -1 %
FEV1-%Pred-Post: 85 %
FEV1-%Pred-Pre: 86 %
FEV1-Post: 1.25 L
FEV1-Pre: 1.27 L
FEV1FVC-%Change-Post: 0 %
FEV1FVC-%Pred-Pre: 106 %
FEV6-%Change-Post: -1 %
FEV6-%Pred-Post: 84 %
FEV6-%Pred-Pre: 85 %
FEV6-Post: 1.54 L
FEV6-Pre: 1.55 L
FEV6FVC-%Change-Post: 0 %
FEV6FVC-%Pred-Post: 104 %
FEV6FVC-%Pred-Pre: 103 %
FVC-%Change-Post: -2 %
FVC-%Pred-Post: 80 %
FVC-%Pred-Pre: 82 %
FVC-Post: 1.54 L
FVC-Pre: 1.57 L
Post FEV1/FVC ratio: 81 %
Post FEV6/FVC ratio: 100 %
Pre FEV1/FVC ratio: 81 %
Pre FEV6/FVC Ratio: 99 %
RV % pred: 75 %
RV: 1.67 L
TLC % pred: 70 %
TLC: 3.35 L

## 2020-07-29 NOTE — Progress Notes (Signed)
PFT done today. 

## 2020-07-30 DIAGNOSIS — J4520 Mild intermittent asthma, uncomplicated: Secondary | ICD-10-CM | POA: Diagnosis not present

## 2020-07-30 DIAGNOSIS — J9601 Acute respiratory failure with hypoxia: Secondary | ICD-10-CM | POA: Diagnosis not present

## 2020-08-06 ENCOUNTER — Other Ambulatory Visit: Payer: Self-pay | Admitting: Student in an Organized Health Care Education/Training Program

## 2020-08-06 DIAGNOSIS — Z1231 Encounter for screening mammogram for malignant neoplasm of breast: Secondary | ICD-10-CM

## 2020-08-14 ENCOUNTER — Telehealth: Payer: Self-pay

## 2020-08-14 DIAGNOSIS — J9601 Acute respiratory failure with hypoxia: Secondary | ICD-10-CM | POA: Diagnosis not present

## 2020-08-14 DIAGNOSIS — J9610 Chronic respiratory failure, unspecified whether with hypoxia or hypercapnia: Secondary | ICD-10-CM | POA: Diagnosis not present

## 2020-08-14 DIAGNOSIS — S301XXA Contusion of abdominal wall, initial encounter: Secondary | ICD-10-CM | POA: Diagnosis not present

## 2020-08-14 DIAGNOSIS — T8189XA Other complications of procedures, not elsewhere classified, initial encounter: Secondary | ICD-10-CM | POA: Diagnosis not present

## 2020-08-14 NOTE — Telephone Encounter (Signed)
Pt states she do not need the oxygen. Please call back.

## 2020-08-14 NOTE — Telephone Encounter (Signed)
Called pt - stated Dr Judeth Horn told her she does not need oxygen anymore. Stated they did the "walking test". She wanted to know who needs to pick up the oxygen- I asked her to call their office since the doctor told her it was no longer needed. But to call back if she has any problems.

## 2020-08-16 NOTE — Telephone Encounter (Signed)
Sounds good to me. I guess the DME company will take back the oxygen supplies?

## 2020-08-21 ENCOUNTER — Other Ambulatory Visit: Payer: Self-pay | Admitting: Student in an Organized Health Care Education/Training Program

## 2020-08-30 DIAGNOSIS — J4520 Mild intermittent asthma, uncomplicated: Secondary | ICD-10-CM | POA: Diagnosis not present

## 2020-08-30 DIAGNOSIS — J9601 Acute respiratory failure with hypoxia: Secondary | ICD-10-CM | POA: Diagnosis not present

## 2020-09-02 ENCOUNTER — Encounter: Payer: Self-pay | Admitting: Student in an Organized Health Care Education/Training Program

## 2020-09-02 ENCOUNTER — Ambulatory Visit (INDEPENDENT_AMBULATORY_CARE_PROVIDER_SITE_OTHER): Payer: Medicare Other | Admitting: Student in an Organized Health Care Education/Training Program

## 2020-09-02 ENCOUNTER — Other Ambulatory Visit: Payer: Self-pay

## 2020-09-02 VITALS — BP 138/66 | HR 79 | Temp 98.1°F | Ht 61.0 in | Wt 286.7 lb

## 2020-09-02 DIAGNOSIS — I10 Essential (primary) hypertension: Secondary | ICD-10-CM | POA: Diagnosis not present

## 2020-09-02 DIAGNOSIS — D509 Iron deficiency anemia, unspecified: Secondary | ICD-10-CM | POA: Diagnosis not present

## 2020-09-02 DIAGNOSIS — G4733 Obstructive sleep apnea (adult) (pediatric): Secondary | ICD-10-CM

## 2020-09-02 NOTE — Assessment & Plan Note (Signed)
Chronic obstructive sleep apnea, combined with also probable obesity hypoventilation syndrome.  Doing better at adhering to CPAP on a nightly basis.  Some discomfort, but overall doing well.  We talked about the importance of adhering to this.  She is also on chronic diuresis with furosemide and spironolactone as it seems that some amount of pulmonary edema played a role in her hospitalization last year.

## 2020-09-02 NOTE — Assessment & Plan Note (Signed)
Blood pressure well controlled today.  Plan to continue with amlodipine 10 and spironolactone 25 mg daily.

## 2020-09-02 NOTE — Progress Notes (Signed)
   Assessment and Plan:  See Encounters tab for problem-based medical decision making.   __________________________________________________________  HPI:   80 year old person living with obesity here for follow-up of hypertension.  Doing okay at home.  Mood is down, has been grieving the loss of her brother since early December.  Feeling isolated because of Covid precautions.  Got her booster in January, I updated her medical record.  Doing well with current medications, good adherence, delivered to her home by the pharmacy.  Reports good adherence with CPAP at night.  Reports that her breathing has been stable, stable functional status.  Still struggles with walking long distances outside the house.  No recent illnesses, no fevers or chills.  No chest pain or shortness of breath at rest.  No cough or congestion.  __________________________________________________________  Problem List: Patient Active Problem List   Diagnosis Date Noted  . Hypercoagulable state (HCC)     Priority: High  . Sleep apnea 07/08/2015    Priority: High  . Bilateral lower extremity edema 01/09/2014    Priority: Medium  . Hyperuricemia 05/25/2013    Priority: Medium  . Nephrolithiasis 05/25/2013    Priority: Medium  . Hypertension 12/22/2011    Priority: Medium  . Obesity, Class III, BMI 40-49.9 (morbid obesity) (HCC) 12/22/2011    Priority: Medium  . Asthma 12/04/2011    Priority: Low  . 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 Routine health maintenance 09/15/2011    Priority: Low  . Iron deficiency anemia     Medications: Reconciled today in Epic __________________________________________________________  Physical Exam:  Vital Signs: Vitals:   09/02/20 0903  BP: 138/66  Pulse: 79  Temp: 98.1 F (36.7 C)  TempSrc: Oral  SpO2: 97%  Weight: 286 lb 11.2 oz (130 kg)  Height: 5\' 1"  (1.549 m)    Gen: Well appearing, NAD CV: RRR, no murmurs Pulm: Normal effort, CTA throughout, no  wheezing  Ext: Warm, chronic 1+ nonpitting edema in bilateral lower extremities

## 2020-09-02 NOTE — Assessment & Plan Note (Signed)
Iron deficiency anemia of unclear etiology last year.  Has done well with oral iron replacement and a Feraheme infusion.  Rechecked iron levels at last visit and they were normal.  Plan to discontinue oral iron supplementation at this point.  Continues to have no obvious signs of bleeding.  Endoscopies showed no colon cancer.  Okay to continue with rivaroxaban 10 mg daily for hypercoagulable state.  Plan to recheck iron studies in 6 months to see if the iron deficiency returns off supplementation.

## 2020-09-02 NOTE — Patient Instructions (Signed)
It was great seeing you today in clinic.  We talked about several issues.  #1, please discontinue taking the iron supplements.  Your iron levels are normal.  We do not want to give you too much iron as it can injure the liver.  I will recheck your iron levels in 6 months.  #2, you have obstructive sleep apnea which causes you to have trouble breathing if it is untreated.  Please continue to use your CPAP on a regular basis.  I have placed an order to discontinue the supplemental oxygen tanks which we will send to your oxygen company.  #3, your blood pressure is well controlled, please continue your current medicines.

## 2020-09-10 DIAGNOSIS — J9610 Chronic respiratory failure, unspecified whether with hypoxia or hypercapnia: Secondary | ICD-10-CM | POA: Diagnosis not present

## 2020-09-10 DIAGNOSIS — G4733 Obstructive sleep apnea (adult) (pediatric): Secondary | ICD-10-CM | POA: Diagnosis not present

## 2020-09-11 ENCOUNTER — Other Ambulatory Visit: Payer: Self-pay | Admitting: Student in an Organized Health Care Education/Training Program

## 2020-09-23 ENCOUNTER — Ambulatory Visit
Admission: RE | Admit: 2020-09-23 | Discharge: 2020-09-23 | Disposition: A | Discharge status: Home / Self Care | Payer: Medicare Other | Source: Ambulatory Visit | Attending: Student in an Organized Health Care Education/Training Program | Admitting: Student in an Organized Health Care Education/Training Program

## 2020-09-23 ENCOUNTER — Other Ambulatory Visit: Payer: Self-pay

## 2020-09-23 DIAGNOSIS — Z1231 Encounter for screening mammogram for malignant neoplasm of breast: Secondary | ICD-10-CM

## 2020-09-30 DIAGNOSIS — J9601 Acute respiratory failure with hypoxia: Secondary | ICD-10-CM | POA: Diagnosis not present

## 2020-09-30 DIAGNOSIS — J4520 Mild intermittent asthma, uncomplicated: Secondary | ICD-10-CM | POA: Diagnosis not present

## 2020-10-09 ENCOUNTER — Other Ambulatory Visit: Payer: Self-pay | Admitting: Student in an Organized Health Care Education/Training Program

## 2020-10-09 IMAGING — MG DIGITAL SCREENING BILATERAL MAMMOGRAM WITH TOMO AND CAD
8 of 17 series · 8 of 40 positions shown · non-contrast
Comparison: Previous exam(s).

CLINICAL DATA: Screening.

EXAM:
DIGITAL SCREENING BILATERAL MAMMOGRAM WITH TOMO AND CAD

[R MLO synth-2D (1 of 2)]
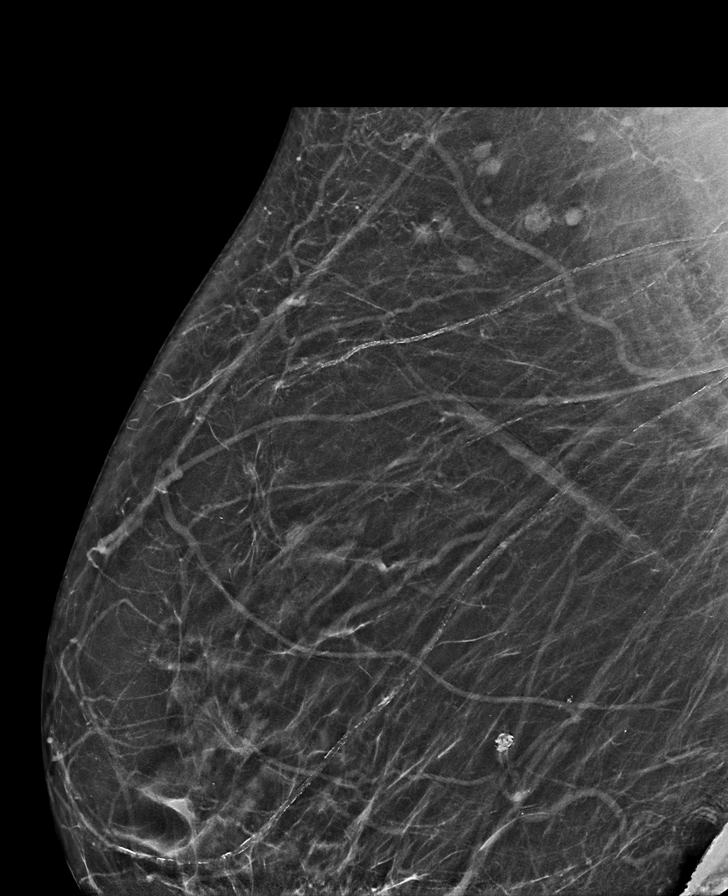

[R MLO synth-2D (2 of 2)]
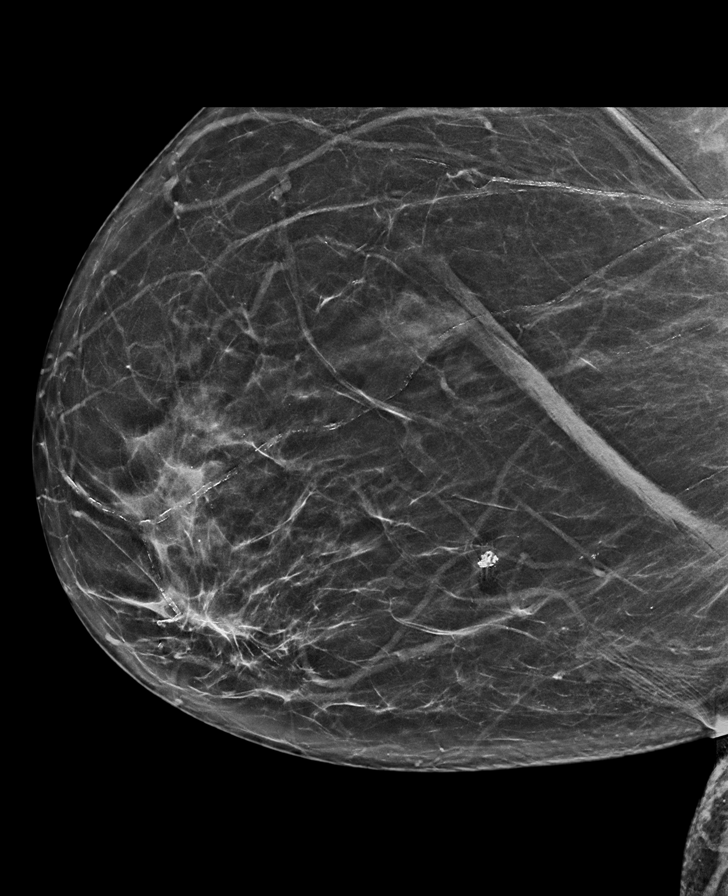

[R CC synth-2D (1 of 2)]
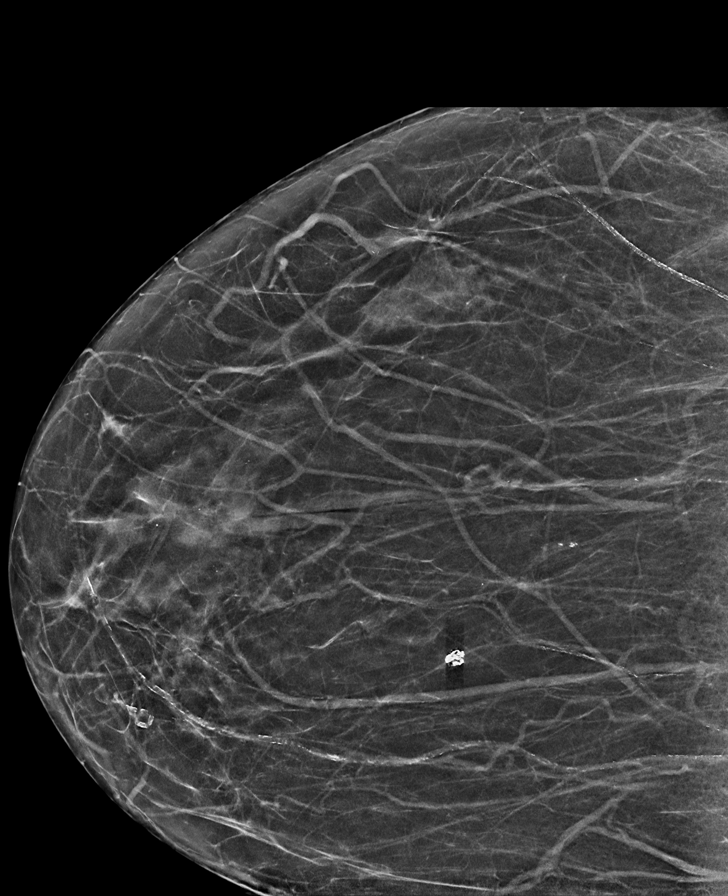

[R CC synth-2D (2 of 2)]
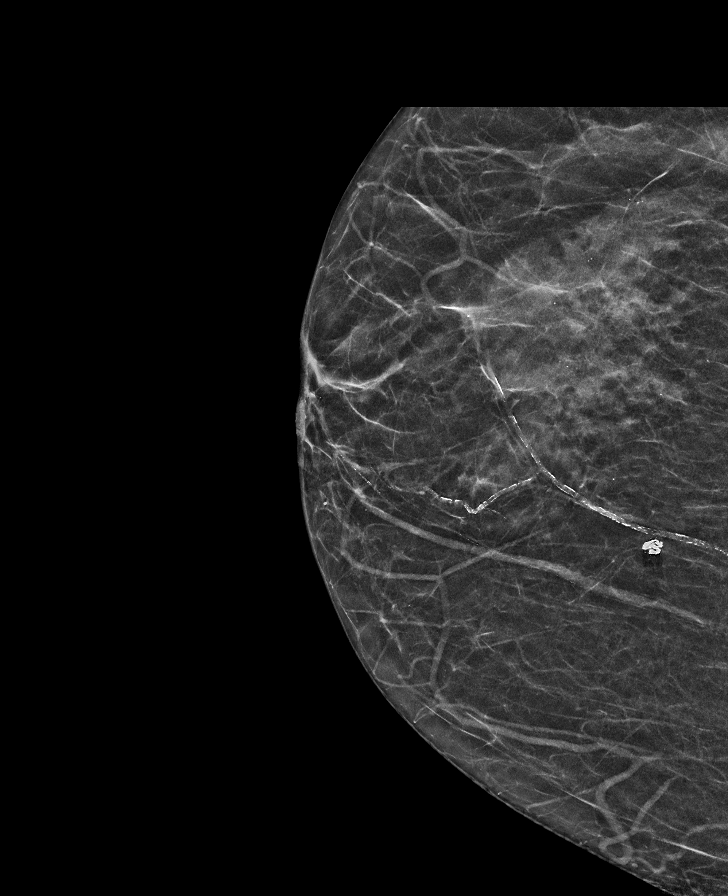

[L MLO synth-2D]
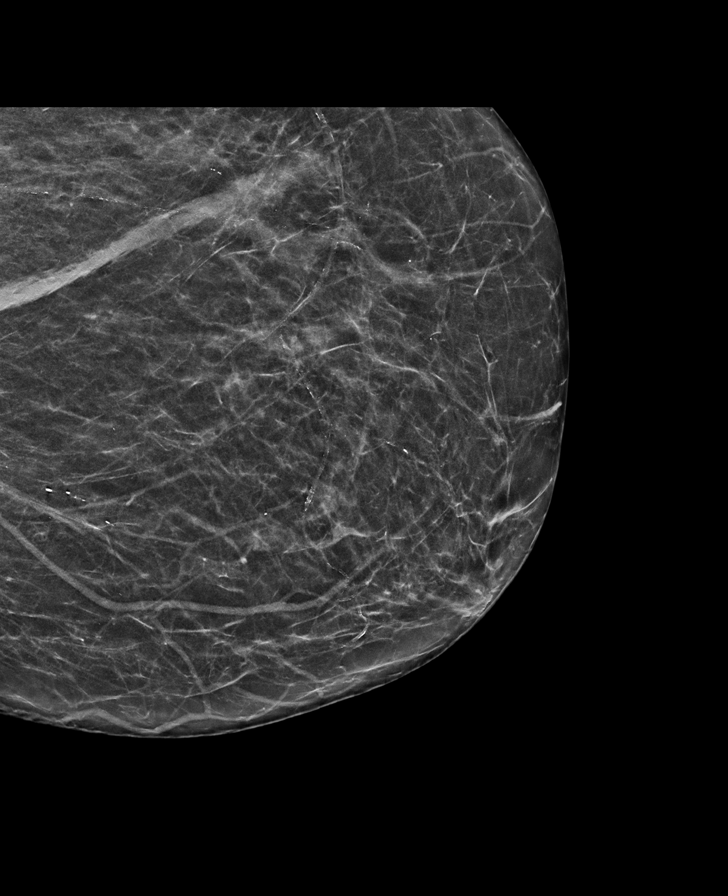

[R CV synth-2D]
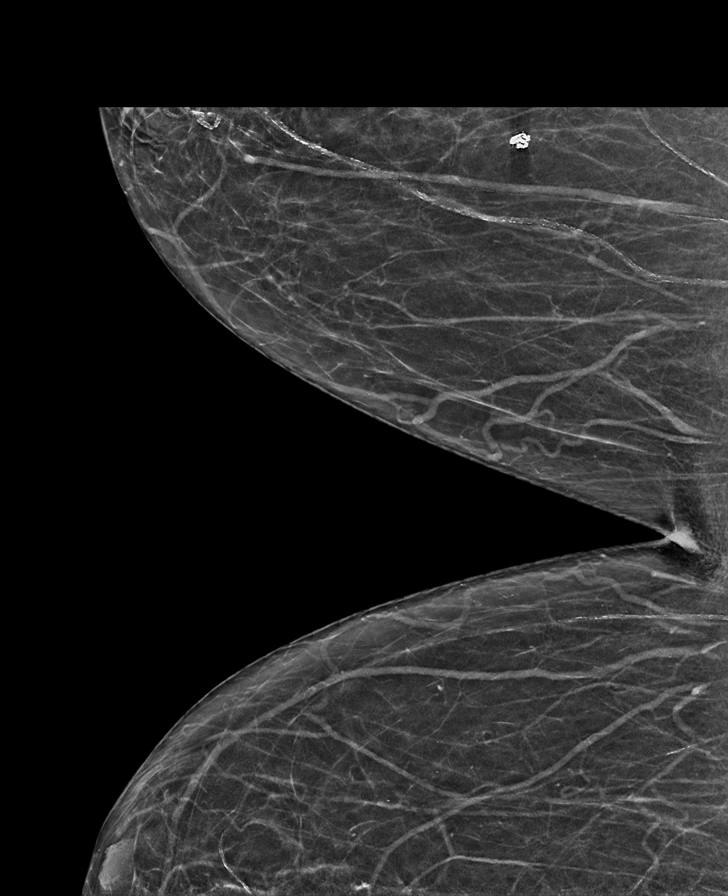

[L CC synth-2D]
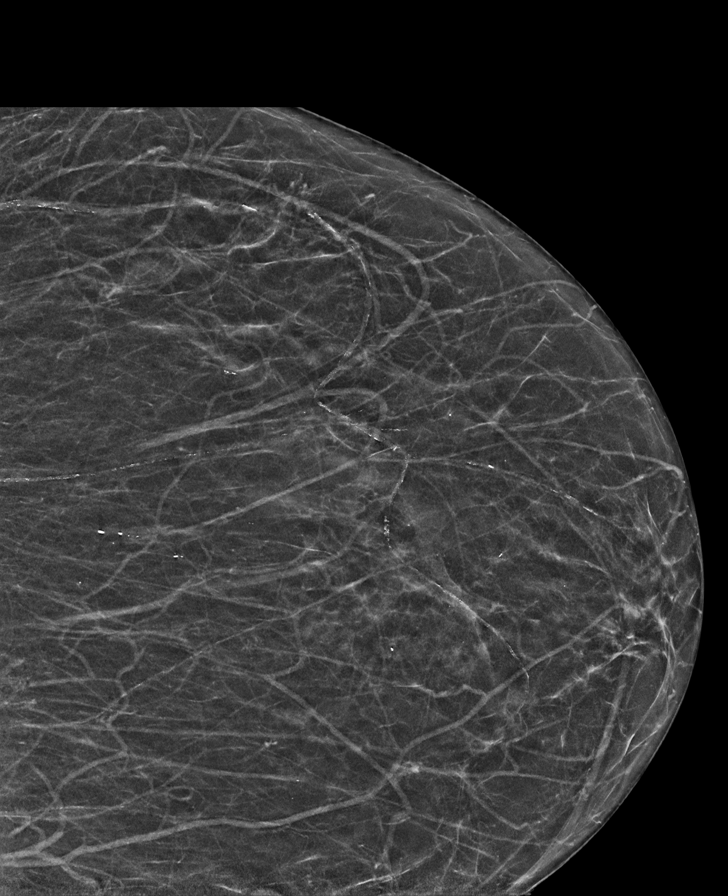

[L MLO tomo · tomo slice 35/68.0]
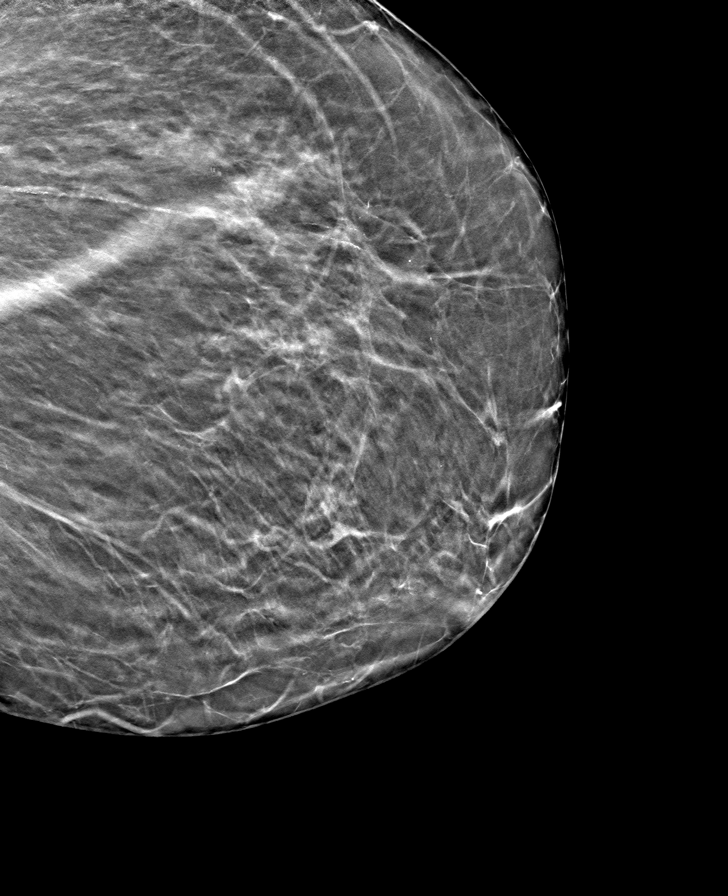

[8 of 40 positions shown; findings below may reference images not displayed]

ACR Breast Density Category b: There are scattered areas of
fibroglandular density.
FINDINGS: There are no findings suspicious for malignancy. Images were
processed with CAD.
IMPRESSION: No mammographic evidence of malignancy. A result letter of this
screening mammogram will be mailed directly to the patient.

RECOMMENDATION:
Screening mammogram in one year. (Code:[id number])

BI-RADS CATEGORY  1: Negative.

## 2020-10-14 DIAGNOSIS — H1045 Other chronic allergic conjunctivitis: Secondary | ICD-10-CM | POA: Diagnosis not present

## 2020-10-14 DIAGNOSIS — H25813 Combined forms of age-related cataract, bilateral: Secondary | ICD-10-CM | POA: Diagnosis not present

## 2020-10-14 DIAGNOSIS — H353131 Nonexudative age-related macular degeneration, bilateral, early dry stage: Secondary | ICD-10-CM | POA: Diagnosis not present

## 2020-10-14 DIAGNOSIS — H04123 Dry eye syndrome of bilateral lacrimal glands: Secondary | ICD-10-CM | POA: Diagnosis not present

## 2020-10-28 DIAGNOSIS — J4520 Mild intermittent asthma, uncomplicated: Secondary | ICD-10-CM | POA: Diagnosis not present

## 2020-10-28 DIAGNOSIS — J9601 Acute respiratory failure with hypoxia: Secondary | ICD-10-CM | POA: Diagnosis not present

## 2020-10-29 ENCOUNTER — Other Ambulatory Visit: Payer: Self-pay | Admitting: Student in an Organized Health Care Education/Training Program

## 2020-11-28 DIAGNOSIS — J4520 Mild intermittent asthma, uncomplicated: Secondary | ICD-10-CM | POA: Diagnosis not present

## 2020-11-28 DIAGNOSIS — J9601 Acute respiratory failure with hypoxia: Secondary | ICD-10-CM | POA: Diagnosis not present

## 2020-12-11 ENCOUNTER — Other Ambulatory Visit: Payer: Self-pay | Admitting: Student in an Organized Health Care Education/Training Program

## 2020-12-27 DIAGNOSIS — G4733 Obstructive sleep apnea (adult) (pediatric): Secondary | ICD-10-CM | POA: Diagnosis not present

## 2020-12-27 DIAGNOSIS — J9610 Chronic respiratory failure, unspecified whether with hypoxia or hypercapnia: Secondary | ICD-10-CM | POA: Diagnosis not present

## 2020-12-28 DIAGNOSIS — J9601 Acute respiratory failure with hypoxia: Secondary | ICD-10-CM | POA: Diagnosis not present

## 2020-12-28 DIAGNOSIS — J4520 Mild intermittent asthma, uncomplicated: Secondary | ICD-10-CM | POA: Diagnosis not present

## 2021-01-04 ENCOUNTER — Other Ambulatory Visit: Payer: Self-pay | Admitting: Student in an Organized Health Care Education/Training Program

## 2021-01-04 DIAGNOSIS — R600 Localized edema: Secondary | ICD-10-CM

## 2021-01-18 ENCOUNTER — Other Ambulatory Visit: Payer: Self-pay | Admitting: Gastroenterology

## 2021-01-24 ENCOUNTER — Other Ambulatory Visit: Payer: Self-pay | Admitting: Student in an Organized Health Care Education/Training Program

## 2021-01-24 DIAGNOSIS — I10 Essential (primary) hypertension: Secondary | ICD-10-CM

## 2021-01-28 ENCOUNTER — Other Ambulatory Visit: Payer: Self-pay | Admitting: Student in an Organized Health Care Education/Training Program

## 2021-01-28 DIAGNOSIS — J9601 Acute respiratory failure with hypoxia: Secondary | ICD-10-CM | POA: Diagnosis not present

## 2021-01-28 DIAGNOSIS — J4520 Mild intermittent asthma, uncomplicated: Secondary | ICD-10-CM | POA: Diagnosis not present

## 2021-02-14 ENCOUNTER — Encounter: Payer: Self-pay | Admitting: *Deleted

## 2021-02-14 NOTE — Progress Notes (Unsigned)

## 2021-02-17 ENCOUNTER — Other Ambulatory Visit: Payer: Self-pay | Admitting: Student in an Organized Health Care Education/Training Program

## 2021-02-17 NOTE — Progress Notes (Unsigned)
Things That May Be Affecting Your Health:  Alcohol  Hearing loss  Pain    Depression  Home Safety  Sexual Health   Diabetes  Lack of physical activity x Stress   Difficulty with daily activities  Loneliness  Tiredness   Drug use  Medicines  Tobacco use   Falls  Motor Vehicle Safety x Weight   Food choices  Oral Health  Other    YOUR PERSONALIZED HEALTH PLAN : 1. Schedule your next subsequent Medicare Wellness visit in one year 2. Attend all of your regular appointments to address your medical issues 3. Complete the preventative screenings and services   Annual Wellness Visit   Medicare Covered Preventative Screenings and Services  Services & Screenings Men and Women Who How Often Need? Date of Last Service Action  Abdominal Aortic Aneurysm Adults with AAA risk factors Once      Alcohol Misuse and Counseling All Adults Screening once a year if no alcohol misuse. Counseling up to 4 face to face sessions.     Bone Density Measurement  Adults at risk for osteoporosis Once every 2 yrs      Lipid Panel Z13.6 All adults without CV disease Once every 5 yrs       Colorectal Cancer  Stool sample or Colonoscopy All adults 50 and older  Once every year Every 10 years        Depression All Adults Once a year  Today   Diabetes Screening Blood glucose, post glucose load, or GTT Z13.1 All adults at risk Pre-diabetics Once per year Twice per year      Diabetes  Self-Management Training All adults Diabetics 10 hrs first year; 2 hours subsequent years. Requires Copay     Glaucoma Diabetics Family history of glaucoma African Americans 50 yrs + Hispanic Americans 65 yrs + Annually - requires coppay      Hepatitis C Z72.89 or F19.20 High Risk for HCV Born between 1945 and 1965 Annually Once Y     HIV Z11.4 All adults based on risk Annually btw ages 16 & 72 regardless of risk Annually > 65 yrs if at increased risk      Lung Cancer Screening Asymptomatic adults aged 70-77 with 30  pack yr history and current smoker OR quit within the last 15 yrs Annually Must have counseling and shared decision making documentation before first screen      Medical Nutrition Therapy Adults with  Diabetes Renal disease Kidney transplant within past 3 yrs 3 hours first year; 2 hours subsequent years     Obesity and Counseling All adults Screening once a year Counseling if BMI 30 or higher  Today   Tobacco Use Counseling Adults who use tobacco  Up to 8 visits in one year     Vaccines Z23 Hepatitis B Influenza  Pneumonia  Adults  Once Once every flu season Two different vaccines separated by one year     Next Annual Wellness Visit People with Medicare Every year  Today     Services & Screenings Women Who How Often Need  Date of Last Service Action  Mammogram  Z12.31 Women over 40 One baseline ages 17-39. Annually ager 40 yrs+      Pap tests All women Annually if high risk. Every 2 yrs for normal risk women      Screening for cervical cancer with  Pap (Z01.419 nl or Z01.411abnl) & HPV Z11.51 Women aged 54 to 48 Once every 5 yrs  Screening pelvic and breast exams All women Annually if high risk. Every 2 yrs for normal risk women     Sexually Transmitted Diseases Chlamydia Gonorrhea Syphilis All at risk adults Annually for non pregnant females at increased risk         Services & Screenings Men Who How Ofter Need  Date of Last Service Action  Prostate Cancer - DRE & PSA Men over 50 Annually.  DRE might require a copay.        Sexually Transmitted Diseases Syphilis All at risk adults Annually for men at increased risk      Health Maintenance List Health Maintenance  Topic Date Due   Hepatitis C Screening  Never done   Zoster Vaccines- Shingrix (1 of 2) Never done   TETANUS/TDAP  09/24/2019   COVID-19 Vaccine (4 - Booster for Pfizer series) 12/08/2020   INFLUENZA VACCINE  03/03/2021   DEXA SCAN  Completed   PNA vac Low Risk Adult  Completed   HPV VACCINES   Aged Out

## 2021-02-27 DIAGNOSIS — J4520 Mild intermittent asthma, uncomplicated: Secondary | ICD-10-CM | POA: Diagnosis not present

## 2021-02-27 DIAGNOSIS — J9601 Acute respiratory failure with hypoxia: Secondary | ICD-10-CM | POA: Diagnosis not present

## 2021-03-18 ENCOUNTER — Other Ambulatory Visit: Payer: Self-pay | Admitting: Gastroenterology

## 2021-03-28 ENCOUNTER — Other Ambulatory Visit: Payer: Self-pay | Admitting: Gastroenterology

## 2021-03-31 ENCOUNTER — Encounter: Payer: Medicare Other | Admitting: Student in an Organized Health Care Education/Training Program

## 2021-04-09 ENCOUNTER — Telehealth: Payer: Self-pay

## 2021-04-09 NOTE — Telephone Encounter (Signed)
Attempted to call patient- no answer or voicemail. Patient is due for AWV.

## 2021-04-11 ENCOUNTER — Other Ambulatory Visit: Payer: Self-pay

## 2021-04-11 DIAGNOSIS — E790 Hyperuricemia without signs of inflammatory arthritis and tophaceous disease: Secondary | ICD-10-CM

## 2021-04-11 MED ORDER — ALLOPURINOL 300 MG PO TABS: 300.0000 mg | ORAL_TABLET | Freq: Every day | ORAL | 3 refills | Status: DC

## 2021-04-11 NOTE — Telephone Encounter (Signed)
allopurinol (ZYLOPRIM) 300 MG tablet, refill request @[facility] - [place], [place] - [place]  [address]

## 2021-04-11 NOTE — Telephone Encounter (Signed)
Next appt scheduled 05/12/21 with PCP.

## 2021-04-24 ENCOUNTER — Other Ambulatory Visit: Payer: Self-pay | Admitting: Gastroenterology

## 2021-04-29 ENCOUNTER — Other Ambulatory Visit: Payer: Self-pay | Admitting: Student in an Organized Health Care Education/Training Program

## 2021-05-09 ENCOUNTER — Other Ambulatory Visit: Payer: Self-pay | Admitting: Gastroenterology

## 2021-05-12 ENCOUNTER — Ambulatory Visit (INDEPENDENT_AMBULATORY_CARE_PROVIDER_SITE_OTHER): Payer: Medicare Other | Admitting: Student in an Organized Health Care Education/Training Program

## 2021-05-12 ENCOUNTER — Encounter: Payer: Self-pay | Admitting: Student in an Organized Health Care Education/Training Program

## 2021-05-12 VITALS — BP 130/75 | HR 70 | Temp 98.2°F | Ht 61.0 in | Wt 267.6 lb

## 2021-05-12 DIAGNOSIS — M26622 Arthralgia of left temporomandibular joint: Secondary | ICD-10-CM

## 2021-05-12 DIAGNOSIS — Z0000 Encounter for general adult medical examination without abnormal findings: Secondary | ICD-10-CM

## 2021-05-12 DIAGNOSIS — G4733 Obstructive sleep apnea (adult) (pediatric): Secondary | ICD-10-CM

## 2021-05-12 DIAGNOSIS — I10 Essential (primary) hypertension: Secondary | ICD-10-CM | POA: Diagnosis not present

## 2021-05-12 DIAGNOSIS — D509 Iron deficiency anemia, unspecified: Secondary | ICD-10-CM

## 2021-05-12 DIAGNOSIS — N200 Calculus of kidney: Secondary | ICD-10-CM | POA: Diagnosis not present

## 2021-05-12 DIAGNOSIS — Z23 Encounter for immunization: Secondary | ICD-10-CM

## 2021-05-12 DIAGNOSIS — D6859 Other primary thrombophilia: Secondary | ICD-10-CM | POA: Diagnosis not present

## 2021-05-12 DIAGNOSIS — M26629 Arthralgia of temporomandibular joint, unspecified side: Secondary | ICD-10-CM | POA: Insufficient documentation

## 2021-05-12 LAB — POCT GLYCOSYLATED HEMOGLOBIN (HGB A1C): Hemoglobin A1C: 4.8 % (ref 4.0–5.6)

## 2021-05-12 LAB — GLUCOSE, CAPILLARY: Glucose-Capillary: 105 mg/dL — ABNORMAL HIGH (ref 70–99)

## 2021-05-12 NOTE — Assessment & Plan Note (Signed)
Symptoms consistent with TMJ arthralgia.  Patient will follow up with her dentist soon, looks to have a cavity at #20, multiple fillings throughout her teeth.  We are going to try to adjust her CPAP mask to see if that helps.  We will continue with Tylenol as needed for pain.  Symptoms not consistent with GCA at this time.

## 2021-05-12 NOTE — Assessment & Plan Note (Signed)
Blood pressure well controlled today.  Likely improved based on recent 19 pound weight loss.  Limited on antihypertensive medications due to solitary kidney and history of nephrolithiasis.  Plan to continue with amlodipine 10 mg daily, spironolactone 25 mg, and carvedilol 25 mg twice daily.  Will check BMP today.

## 2021-05-12 NOTE — Assessment & Plan Note (Signed)
DiscussedPatient with difficulty tolerating the mask at this time, limiting its use.  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  She wants to try the nasal prongs instead of the full mask, which I think is reasonable.  We will contact [facility] to see if they need an order for this change and mask type.

## 2021-05-12 NOTE — Assessment & Plan Note (Signed)
History of endoscopy negative iron deficiency anemia.  Probably some slow oozing from low-dose anticoagulation which she uses for chronic hypercoagulable state.  We discontinued iron supplementation in January.  She is having some symptoms of feeling drained, but not restless legs or fatigue.  Will check CBC and iron levels today.  If her iron stores are low again will resume with oral iron polysaccharide.

## 2021-05-12 NOTE — Progress Notes (Signed)
   Assessment and Plan:  See Encounters tab for problem-based medical decision making.   __________________________________________________________  HPI:   80 year old person here for follow-up of obesity, hypertension, and chronic heart failure with preserved ejection fraction.  Doing okay at home, lives independently, no recent hospitalizations or emergency department visits.  A couple small concerns today, feels like she has a sensation of pain and stiffness on the left side of her face, left ear, radiating down to the left neck.  Bothers her a couple times a week, seems to be worsened with chewing and with wearing the CPAP mask.  Denies any headache, no fever or night sweats, no changes in her vision.  Has some discomfort at the lower left neck as well, and around her cervical spine with some radiation into the shoulder.  No chest pain, no shortness of breath with activity, stable functional status.   __________________________________________________________  Problem List: Patient Active Problem List   Diagnosis Date Noted   Hypercoagulable state [facility])     Priority: 1.   Sleep apnea 07/08/2015    Priority: 1.   Bilateral lower extremity edema 01/09/2014    Priority: 2.   Hyperuricemia 05/25/2013    Priority: 2.   Nephrolithiasis 05/25/2013    Priority: 2.   Hypertension 12/22/2011    Priority: 2.   Obesity, Class III, BMI 40-49.9 (morbid obesity) (HCC) 12/22/2011    Priority: 2.   Asthma 12/04/2011    Priority: 3.   Single kidney 09/15/2011    Priority: 3.   Routine health maintenance 09/15/2011    Priority: 3.   Iron deficiency anemia     Medications: Reconciled today in Epic __________________________________________________________  Physical Exam:  Vital Signs: Vitals:   05/12/21 0952  BP: 130/75  Pulse: 70  Temp: 98.2 F (36.8 C)  TempSrc: Oral  SpO2: 99%  Weight: 267 lb 9.6 oz (121.4 kg)  Height: 5\' 1"  (1.549 m)    Gen: Well appearing, NAD ENT: OP  clear without erythema or exudate.  Multiple missing teeth, multiple fillings, looks to have a cavity at #20, no tenderness at the gumline or abscess.  No clicking at the bilateral TMJs or tenderness. Neck: No cervical LAD, No thyromegaly or nodules, No JVD.  Cannot locate the area of tenderness. CV: RRR, no murmurs Pulm: Normal effort, CTA throughout, no wheezing Ext: Warm, 1+ chronic nonpitting lower extremity edema bilaterally

## 2021-05-12 NOTE — Assessment & Plan Note (Signed)
Weight 267 pounds, BMI 50.  Down 19 pounds since I last saw her in January.  She reports making good changes with her nutrition.  I congratulated her.  Hopefully this will help with her mobility, sleep apnea, diastolic heart failure, and hypertension.

## 2021-05-12 NOTE — Assessment & Plan Note (Signed)
History of nephrolithiasis resulting in solitary kidney.  We are doing lower uric acid lowering with allopurinol, will check uric acid level today.  She was previously on potassium citrate, but this was discontinued when she started spironolactone for her heart failure.

## 2021-05-12 NOTE — Assessment & Plan Note (Signed)
No thromboembolic events since 2017.  Tolerating low-dose anticoagulation well without bleeding events.  Having some issues with iron deficiency about a year ago, order to recheck iron stores today.  Plan to continue with Xarelto 10 mg daily.  Check renal function today as well.

## 2021-05-15 ENCOUNTER — Encounter: Payer: Self-pay | Admitting: Student in an Organized Health Care Education/Training Program

## 2021-05-15 LAB — LIPID PANEL
Chol/HDL Ratio: 3.3 ratio (ref 0.0–4.4)
Cholesterol, Total: 189 mg/dL (ref 100–199)
HDL: 58 mg/dL (ref >39)
LDL Chol Calc (NIH): 114 mg/dL — ABNORMAL HIGH (ref 0–99)
Triglycerides: 92 mg/dL (ref 0–149)
VLDL Cholesterol Cal: 17 mg/dL (ref 5–40)

## 2021-05-15 LAB — IRON AND TIBC
Iron Saturation: 13 % — ABNORMAL LOW (ref 15–55)
Iron: 51 ug/dL (ref 27–139)
Total Iron Binding Capacity: 385 ug/dL (ref 250–450)
UIBC: 334 ug/dL (ref 118–369)

## 2021-05-15 LAB — CBC
Hematocrit: 39.6 % (ref 34.0–46.6)
Hemoglobin: 12.5 g/dL (ref 11.1–15.9)
MCH: 30.5 pg (ref 26.6–33.0)
MCHC: 31.6 g/dL (ref 31.5–35.7)
MCV: 97 fL (ref 79–97)
Platelets: 252 10*3/uL (ref 150–450)
RBC: 4.1 x10E6/uL (ref 3.77–5.28)
RDW: 13.3 % (ref 11.7–15.4)
WBC: 7.5 10*3/uL (ref 3.4–10.8)

## 2021-05-15 LAB — FERRITIN: Ferritin: 86 ng/mL (ref 15–150)

## 2021-05-15 LAB — URIC ACID: Uric Acid: 2.6 mg/dL — ABNORMAL LOW (ref 3.1–7.9)

## 2021-05-15 MED ORDER — POLYSACCHARIDE IRON COMPLEX 150 MG PO CAPS: 150.0000 mg | ORAL_CAPSULE | Freq: Every day | ORAL | 1 refills | Status: DC

## 2021-05-15 NOTE — Addendum Note (Signed)
Addended by: Erlinda Hong T on: 05/15/2021 01:34 PM   Modules accepted: Orders

## 2021-05-20 ENCOUNTER — Telehealth: Payer: Self-pay

## 2021-05-20 NOTE — Telephone Encounter (Signed)
Received TC from patient who states the Niferex was too expensive and she did not get it.  However, she had ordered 2 bottles of Ferrous sulfate from her insurance company and started taking two 65mg  tabs daily since last week.  States "I already have more energy". Wants to know if she should continue 2 (65mg ) tabs daily. Will sent to PCP to advise. SChaplin, RN,BSN

## 2021-05-20 NOTE — Telephone Encounter (Signed)
TC to patient, VM obtained and message left to call triage nurse back. SChaplin, RN,BSN

## 2021-05-20 NOTE — Telephone Encounter (Signed)
Ok. That is fine. I would recommend taking just one tablet daily to help with absorption and reduce side effects. Glad she is feeling better.

## 2021-06-25 DIAGNOSIS — J9610 Chronic respiratory failure, unspecified whether with hypoxia or hypercapnia: Secondary | ICD-10-CM | POA: Diagnosis not present

## 2021-06-25 DIAGNOSIS — G4733 Obstructive sleep apnea (adult) (pediatric): Secondary | ICD-10-CM | POA: Diagnosis not present

## 2021-07-02 ENCOUNTER — Other Ambulatory Visit: Payer: Self-pay | Admitting: Student in an Organized Health Care Education/Training Program

## 2021-07-02 DIAGNOSIS — R600 Localized edema: Secondary | ICD-10-CM

## 2021-08-15 ENCOUNTER — Other Ambulatory Visit: Payer: Self-pay | Admitting: Gastroenterology

## 2021-08-15 ENCOUNTER — Other Ambulatory Visit: Payer: Self-pay | Admitting: Student in an Organized Health Care Education/Training Program

## 2021-08-15 NOTE — Telephone Encounter (Signed)
Next appt scheduled 12/01/21 with PCP.

## 2021-08-20 ENCOUNTER — Other Ambulatory Visit: Payer: Self-pay | Admitting: Student in an Organized Health Care Education/Training Program

## 2021-08-20 DIAGNOSIS — Z1231 Encounter for screening mammogram for malignant neoplasm of breast: Secondary | ICD-10-CM

## 2021-09-16 ENCOUNTER — Other Ambulatory Visit: Payer: Self-pay | Admitting: Student in an Organized Health Care Education/Training Program

## 2021-09-24 ENCOUNTER — Ambulatory Visit
Admission: RE | Admit: 2021-09-24 | Discharge: 2021-09-24 | Disposition: A | Discharge status: Home / Self Care | Payer: Medicare Other | Source: Ambulatory Visit | Attending: Student in an Organized Health Care Education/Training Program | Admitting: Student in an Organized Health Care Education/Training Program

## 2021-09-24 ENCOUNTER — Other Ambulatory Visit: Payer: Self-pay

## 2021-09-24 DIAGNOSIS — Z1231 Encounter for screening mammogram for malignant neoplasm of breast: Secondary | ICD-10-CM

## 2021-10-01 ENCOUNTER — Other Ambulatory Visit: Payer: Self-pay | Admitting: Student in an Organized Health Care Education/Training Program

## 2021-10-08 DIAGNOSIS — J9610 Chronic respiratory failure, unspecified whether with hypoxia or hypercapnia: Secondary | ICD-10-CM | POA: Diagnosis not present

## 2021-10-08 DIAGNOSIS — G4733 Obstructive sleep apnea (adult) (pediatric): Secondary | ICD-10-CM | POA: Diagnosis not present

## 2021-10-14 DIAGNOSIS — H25813 Combined forms of age-related cataract, bilateral: Secondary | ICD-10-CM | POA: Diagnosis not present

## 2021-10-14 DIAGNOSIS — H353131 Nonexudative age-related macular degeneration, bilateral, early dry stage: Secondary | ICD-10-CM | POA: Diagnosis not present

## 2021-10-14 DIAGNOSIS — H04123 Dry eye syndrome of bilateral lacrimal glands: Secondary | ICD-10-CM | POA: Diagnosis not present

## 2021-10-14 DIAGNOSIS — H1045 Other chronic allergic conjunctivitis: Secondary | ICD-10-CM | POA: Diagnosis not present

## 2021-12-01 ENCOUNTER — Encounter: Payer: Self-pay | Admitting: Student in an Organized Health Care Education/Training Program

## 2021-12-01 ENCOUNTER — Ambulatory Visit (INDEPENDENT_AMBULATORY_CARE_PROVIDER_SITE_OTHER): Payer: Medicare Other | Admitting: Student in an Organized Health Care Education/Training Program

## 2021-12-01 VITALS — BP 129/75 | HR 68 | Ht 64.0 in | Wt 253.2 lb

## 2021-12-01 DIAGNOSIS — I10 Essential (primary) hypertension: Secondary | ICD-10-CM

## 2021-12-01 DIAGNOSIS — N200 Calculus of kidney: Secondary | ICD-10-CM | POA: Diagnosis not present

## 2021-12-01 DIAGNOSIS — D6859 Other primary thrombophilia: Secondary | ICD-10-CM

## 2021-12-01 DIAGNOSIS — Z6841 Body Mass Index (BMI) 40.0 and over, adult: Secondary | ICD-10-CM

## 2021-12-01 DIAGNOSIS — E790 Hyperuricemia without signs of inflammatory arthritis and tophaceous disease: Secondary | ICD-10-CM

## 2021-12-01 DIAGNOSIS — D509 Iron deficiency anemia, unspecified: Secondary | ICD-10-CM | POA: Diagnosis not present

## 2021-12-01 MED ORDER — POTASSIUM CITRATE ER 15 MEQ (1620 MG) PO TBCR: 1.0000 | EXTENDED_RELEASE_TABLET | Freq: Every day | ORAL | 3 refills | Status: DC

## 2021-12-01 NOTE — Assessment & Plan Note (Signed)
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  On treatment with allopurinol with good response in her uric acid.  Stop potassium citrate last year because she required spironolactone for control of severe hypertension.  Hypertension is now much better controlled after recent weight loss.  We will stop spironolactone and restart potassium citrate 15 mill equivalents daily.  She has a solitary kidney so prevention of nephrolithiasis is very important in her case. ?

## 2021-12-01 NOTE — Assessment & Plan Note (Signed)
History of endoscopy negative iron deficiency anemia, required iron infusion last year.  She has not been able to tolerate oral iron for many months.  No pica or other clear symptoms, does have some nonspecific fatigue.  Will recheck iron panel today and may need to arrange for another iron infusion. ?

## 2021-12-01 NOTE — Assessment & Plan Note (Signed)
History of uric acid nephrolithiasis.  We will check uric acid today and continue with allopurinol 300 mg daily. ?

## 2021-12-01 NOTE — Assessment & Plan Note (Signed)
Blood pressure much improved after 40 pound weight loss over the last 18 months.  Plan to continue with amlodipine 10 mg daily.  At this point I am going to discontinue spironolactone as the patient has had some low blood pressures at home.  This will also allow [place] to better treat nephrolithiasis with potassium citrate.  Unable to use HCTZ because of history of nephrolithiasis. ?

## 2021-12-01 NOTE — Progress Notes (Signed)
? ?Established Patient Office Visit ? ?Subjective   ?Patient ID: Tina Chase, female    DOB: 1941-04-19  Age: 81 y.o. MRN: [id number] ? ? ?HPI ? ?81 year old person here for follow-up of hypertension.  Doing very well over the last 1 year.  Made a lot of changes in her nutrition and has enjoyed a nice weight loss.  Down 14 pounds since I last saw her 6 months ago.  Down about 40 pounds over the last 18 months.  She cut out added sugar and has overall been eating healthier.  She wants to start exercising more regularly.  No chest pain or shortness of breath.  No recent falls.  No recent illness, ED visit, or admission.  Reports good adherence with her medications without side effects.  No recent changes in her medications.  Lives independently at home.  ? ?  ?Objective:  ?  ? ?BP 129/75 (BP Location: Right Wrist, Patient Position: Sitting, Cuff Size: Normal)   Pulse 68   Ht 5\' 4"  (1.626 m)   Wt 253 lb 3.2 oz (114.9 kg)   LMP 09/23/1986   SpO2 100%   BMI 43.46 kg/m?  ? ? ?Physical Exam ? ?Gen: Well-appearing woman, no distress ?Neck: No lymphadenopathy, no thyromegaly ?Lungs: Clear to auscultation bilaterally, no wheezing ?Heart: Regular rate and rhythm with no murmurs ?Abd: Soft and nontender, nondistended ?Ext: Nonpitting lipedema in both lower extremities, left is worse than the right. ? ?  ?Assessment & Plan:  ? ?Problem List Items Addressed This Visit   ? ?  ? High  ? Hypercoagulable state (HCC) (Chronic)  ?  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  Tolerating Xarelto 10 mg daily, will continue and check renal function today. ? ?  ?  ?  ? Medium   ? Hypertension (Chronic)  ?  Blood pressure much improved after 40 pound weight loss over the last 18 months.  Plan to continue with amlodipine 10 mg daily.  At this point I am going to discontinue spironolactone as the patient has had some low blood pressures at home.  This will also allow 2018 to better treat  nephrolithiasis with potassium citrate.  Unable to use HCTZ because of history of nephrolithiasis. ? ?  ?  ? Relevant Orders  ? BMP8+Anion Gap  ? Obesity, Class III, BMI 40-49.9 (morbid obesity) (HCC) (Chronic)  ?  Weight of 253 pounds with a BMI of 43.  Excellent weight loss over the last 18 months, down about 40 pounds.  She is made great nutrition changes which are driving this weight loss.  We talked about increasing exercise.  I offered the patient a nutritionist but she has declined for now.  No history of diabetes, last A1c was normal. ? ?  ?  ? Hyperuricemia (Chronic)  ?  History of uric acid nephrolithiasis.  We will check uric acid today and continue with allopurinol 300 mg daily. ? ?  ?  ? Relevant Orders  ? Uric acid  ? Nephrolithiasis - Primary (Chronic)  ?  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  On treatment with allopurinol with good response in her uric acid.  Stop potassium citrate last year because she required spironolactone for control of severe hypertension.  Hypertension is now much better controlled after recent weight loss.  We will stop spironolactone and restart potassium citrate 15 mill equivalents daily.  She has a solitary kidney so prevention of nephrolithiasis is  very important in her case. ? ?  ?  ? Relevant Medications  ? Potassium Citrate 15 MEQ (1620 MG) TBCR  ?  ? Unprioritized  ? Iron deficiency anemia  ?  History of endoscopy negative iron deficiency anemia, required iron infusion last year.  She has not been able to tolerate oral iron for many months.  No pica or other clear symptoms, does have some nonspecific fatigue.  Will recheck iron panel today and may need to arrange for another iron infusion. ? ?  ?  ? Relevant Orders  ? Iron and IBC ([id number])  ? Ferritin  ? ? ?Return in about 6 months (around 06/03/2022).  ? ? ?Tyson Alias, MD ? ?

## 2021-12-01 NOTE — Assessment & Plan Note (Signed)
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  Tolerating Xarelto 10 mg daily, will continue and check renal function today. ?

## 2021-12-01 NOTE — Assessment & Plan Note (Addendum)
Weight of 253 pounds with a BMI of 43.  Excellent weight loss over the last 18 months, down about 40 pounds.  She is made great nutrition changes which are driving this weight loss.  We talked about increasing exercise.  I offered the patient a nutritionist but she has declined for now.  No history of diabetes, last A1c was normal. ?

## 2021-12-02 ENCOUNTER — Telehealth: Payer: Self-pay | Admitting: Student in an Organized Health Care Education/Training Program

## 2021-12-02 ENCOUNTER — Telehealth: Payer: Self-pay

## 2021-12-02 DIAGNOSIS — N179 Acute kidney failure, unspecified: Secondary | ICD-10-CM

## 2021-12-02 LAB — BMP8+ANION GAP
Anion Gap: 9 mmol/L — ABNORMAL LOW (ref 10.0–18.0)
BUN/Creatinine Ratio: 25 (ref 12–28)
BUN: 33 mg/dL — ABNORMAL HIGH (ref 8–27)
CO2: 28 mmol/L (ref 20–29)
Calcium: 10.6 mg/dL — ABNORMAL HIGH (ref 8.7–10.3)
Chloride: 100 mmol/L (ref 96–106)
Creatinine, Ser: 1.31 mg/dL — ABNORMAL HIGH (ref 0.57–1.00)
Glucose: 101 mg/dL — ABNORMAL HIGH (ref 70–99)
Potassium: 4.5 mmol/L (ref 3.5–5.2)
Sodium: 137 mmol/L (ref 134–144)
eGFR: 41 mL/min/{1.73_m2} — ABNORMAL LOW (ref >59)

## 2021-12-02 LAB — IRON AND TIBC
Iron Saturation: 19 % (ref 15–55)
Iron: 67 ug/dL (ref 27–139)
Total Iron Binding Capacity: 350 ug/dL (ref 250–450)
UIBC: 283 ug/dL (ref 118–369)

## 2021-12-02 LAB — FERRITIN: Ferritin: 187 ng/mL — ABNORMAL HIGH (ref 15–150)

## 2021-12-02 LAB — URIC ACID: Uric Acid: 3 mg/dL — ABNORMAL LOW (ref 3.1–7.9)

## 2021-12-02 NOTE — Telephone Encounter (Signed)
I left VM for patient to return call about yesterady's labwork which showed rise in creatinine. She was asymptomatic, so not sure of the cause. I asked her to stop taking spironolactone for now. I would like to ask her to come back in 2 weeks for repeat labs in a lab-only visit. I have placed future orders.  ?

## 2021-12-02 NOTE — Telephone Encounter (Signed)
Requesting lab results, please call back.  

## 2021-12-02 NOTE — Telephone Encounter (Signed)
Thank you Hme. I called her back and gave lab results. Can someone please call her to schedule a lab only visit for two weeks? I left the future lab orders in the telephone encounter from earlier today.  ?

## 2021-12-16 ENCOUNTER — Other Ambulatory Visit (INDEPENDENT_AMBULATORY_CARE_PROVIDER_SITE_OTHER): Payer: Medicare Other

## 2021-12-16 DIAGNOSIS — N179 Acute kidney failure, unspecified: Secondary | ICD-10-CM

## 2021-12-17 ENCOUNTER — Encounter: Payer: Self-pay | Admitting: Student in an Organized Health Care Education/Training Program

## 2021-12-17 ENCOUNTER — Telehealth: Payer: Self-pay | Admitting: Student in an Organized Health Care Education/Training Program

## 2021-12-17 ENCOUNTER — Telehealth: Payer: Self-pay | Admitting: *Deleted

## 2021-12-17 LAB — BMP8+ANION GAP
Anion Gap: 14 mmol/L (ref 10.0–18.0)
BUN/Creatinine Ratio: 22 (ref 12–28)
BUN: 27 mg/dL (ref 8–27)
CO2: 25 mmol/L (ref 20–29)
Calcium: 9.9 mg/dL (ref 8.7–10.3)
Chloride: 101 mmol/L (ref 96–106)
Creatinine, Ser: 1.21 mg/dL — ABNORMAL HIGH (ref 0.57–1.00)
Glucose: 94 mg/dL (ref 70–99)
Potassium: 4.3 mmol/L (ref 3.5–5.2)
Sodium: 140 mmol/L (ref 134–144)
eGFR: 46 mL/min/{1.73_m2} — ABNORMAL LOW (ref >59)

## 2021-12-17 NOTE — Telephone Encounter (Signed)
I left a VM for this person. She had follow up labs yesterday after a small AKI earlier in the month. Renal function looks better. She can continue current medications including potassium. Don't take the spironolactone. I would like to follow up with her in 3-4 months in clinic. ?

## 2021-12-17 NOTE — Telephone Encounter (Signed)
Call from patient .  Stating that she got the message from Dr. Evette Doffing.  Has further questions and would like another call. ?

## 2021-12-17 NOTE — Telephone Encounter (Signed)
Thank you. I spoke with her and gave updates on her blood work. Can you call her back to schedule a follow up visit with me in 3 months please? ?

## 2021-12-25 ENCOUNTER — Other Ambulatory Visit: Payer: Self-pay | Admitting: Student in an Organized Health Care Education/Training Program

## 2021-12-25 DIAGNOSIS — R600 Localized edema: Secondary | ICD-10-CM

## 2022-01-02 DIAGNOSIS — H25812 Combined forms of age-related cataract, left eye: Secondary | ICD-10-CM | POA: Diagnosis not present

## 2022-01-03 ENCOUNTER — Other Ambulatory Visit: Payer: Self-pay | Admitting: Student in an Organized Health Care Education/Training Program

## 2022-01-03 DIAGNOSIS — E790 Hyperuricemia without signs of inflammatory arthritis and tophaceous disease: Secondary | ICD-10-CM

## 2022-01-12 ENCOUNTER — Other Ambulatory Visit: Payer: Self-pay | Admitting: Student in an Organized Health Care Education/Training Program

## 2022-01-14 DIAGNOSIS — H2511 Age-related nuclear cataract, right eye: Secondary | ICD-10-CM | POA: Diagnosis not present

## 2022-01-16 DIAGNOSIS — H25811 Combined forms of age-related cataract, right eye: Secondary | ICD-10-CM | POA: Diagnosis not present

## 2022-01-21 DIAGNOSIS — J9610 Chronic respiratory failure, unspecified whether with hypoxia or hypercapnia: Secondary | ICD-10-CM | POA: Diagnosis not present

## 2022-01-21 DIAGNOSIS — G4733 Obstructive sleep apnea (adult) (pediatric): Secondary | ICD-10-CM | POA: Diagnosis not present

## 2022-02-09 ENCOUNTER — Other Ambulatory Visit: Payer: Self-pay | Admitting: Student in an Organized Health Care Education/Training Program

## 2022-03-09 ENCOUNTER — Ambulatory Visit (INDEPENDENT_AMBULATORY_CARE_PROVIDER_SITE_OTHER): Payer: Medicare Other | Admitting: Student in an Organized Health Care Education/Training Program

## 2022-03-09 ENCOUNTER — Encounter: Payer: Self-pay | Admitting: Student in an Organized Health Care Education/Training Program

## 2022-03-09 DIAGNOSIS — N1832 Chronic kidney disease, stage 3b: Secondary | ICD-10-CM | POA: Diagnosis not present

## 2022-03-09 DIAGNOSIS — R600 Localized edema: Secondary | ICD-10-CM | POA: Diagnosis not present

## 2022-03-09 DIAGNOSIS — Z6841 Body Mass Index (BMI) 40.0 and over, adult: Secondary | ICD-10-CM

## 2022-03-09 DIAGNOSIS — N1831 Chronic kidney disease, stage 3a: Secondary | ICD-10-CM | POA: Insufficient documentation

## 2022-03-09 NOTE — Assessment & Plan Note (Signed)
History of right nephrectomy for nephrolithiasis many years ago, now with worsening renal function on routine labs in May. GFR estimated at 41 for unclear reason. She does have chronic hypertension that has been difficult to control. We stopped spironolactone which resulted in a slight improvement. No symptoms today and exam is reassuring, she is euvolemic. Will stop lasix which she uses for chronic LE edema related to obesity. Will check a renal ultrasound to rule out recurrent hydronephrosis. Will check BMP, CBC, SPEP, IFE, light chains, UA and urine microalbumin. For treatment of the CKD, no ACE/ARB given history of angioedema. May be able to use SGLT2 inhibitor if she has elevated urine microalbumin. If GFR still in the 3b range today, will refer to nephrology given her risk of progression with a solitary kidney.

## 2022-03-09 NOTE — Assessment & Plan Note (Signed)
Weight today is 262 lbs with BMI of 45, up about 9 lbs since I last saw her in May. She reports having a lot of travel this summer which changed her diet choices, has not been as regimented as she was in the spring time. She is planning on returning to the diet changes that were successful for her before.

## 2022-03-09 NOTE — Assessment & Plan Note (Signed)
Chronic bilateral lymphedema is stable, has been difficult to treat in the past. Will have to discontinue lasix at this point given worsening renal function. She cannot tolerate compression stockings. We talked about ambulation and elevation as treatments. Weightloss would likely have the most impact.

## 2022-03-09 NOTE — Progress Notes (Signed)
Established Patient Office Visit  Subjective   Patient ID: Tina Chase, female    DOB: 04/16/1941  Age: 81 y.o. MRN: [id number]  Chief Complaint  Patient presents with   Medical Management of Chronic Issues    HPI  81 year old person here for follow up of hypertension and kidney injury. She is feeling well, only acute concern is for a left sided leg cramp that happens a few times a week, only lasts a few minutes at a time. She uses a table spook of apple cider vinegar a day and lemon juice to help with cramping sensation chronically. She has been traveling this summer which has gone well. She reports good adherence with medications, no side effects. No recent illness. No changes in urine, no hematuria. No fevers or chills. No rash or arthralgias.     Objective:     BP (!) 135/52 (BP Location: Left Arm, Patient Position: Sitting, Cuff Size: Large)   Pulse 69   Temp 97.9 F (36.6 C) (Oral)   Ht 5\' 4"  (1.626 m)   Wt 262 lb 6.4 oz (119 kg)   LMP 09/23/1986   SpO2 99%   BMI 45.04 kg/m    Physical Exam  Gen: well appearing woman, no distress CV: RRR, no murmurs Lungs: unlabored, clear throughout Abd: soft, non tender Ext: chronic 2+ non pitting edema in both legs is unchanged Psych: normal mood, not depressed or anxious appearing.    Assessment & Plan:   Problem List Items Addressed This Visit       Medium    Obesity, Class III, BMI 40-49.9 (morbid obesity) (HCC) (Chronic)    Weight today is 262 lbs with BMI of 45, up about 9 lbs since I last saw her in May. She reports having a lot of travel this summer which changed her diet choices, has not been as regimented as she was in the spring time. She is planning on returning to the diet changes that were successful for her before.      Bilateral lower extremity edema (Chronic)    Chronic bilateral lymphedema is stable, has been difficult to treat in the past. Will have to discontinue lasix at this point given worsening  renal function. She cannot tolerate compression stockings. We talked about ambulation and elevation as treatments. Weightloss would likely have the most impact.         Unprioritized   Stage 3b chronic kidney disease (CKD) (HCC)    History of right nephrectomy for nephrolithiasis many years ago, now with worsening renal function on routine labs in May. GFR estimated at 41 for unclear reason. She does have chronic hypertension that has been difficult to control. We stopped spironolactone which resulted in a slight improvement. No symptoms today and exam is reassuring, she is euvolemic. Will stop lasix which she uses for chronic LE edema related to obesity. Will check a renal ultrasound to rule out recurrent hydronephrosis. Will check BMP, CBC, SPEP, IFE, light chains, UA and urine microalbumin. For treatment of the CKD, no ACE/ARB given history of angioedema. May be able to use SGLT2 inhibitor if she has elevated urine microalbumin. If GFR still in the 3b range today, will refer to nephrology given her risk of progression with a solitary kidney.      Relevant Orders   BMP8+Anion Gap   Microalbumin / Creatinine Urine Ratio   Urinalysis, Reflex Microscopic   IFE, PE and FLC, Serum   June Renal   CBC no  Diff    Return in about 3 months (around 06/09/2022).    Tyson Alias, MD

## 2022-03-09 NOTE — Assessment & Plan Note (Signed)
Blood pressure well controlled today on amlodipine and carvedilol. We had to stop spiro due to worsening renal function, which has not impacted her hypertension control.

## 2022-03-10 LAB — URINALYSIS, ROUTINE W REFLEX MICROSCOPIC
Bilirubin, UA: NEGATIVE
Glucose, UA: NEGATIVE
Ketones, UA: NEGATIVE
Nitrite, UA: NEGATIVE
RBC, UA: NEGATIVE
Specific Gravity, UA: 1.019 (ref 1.005–1.030)
Urobilinogen, Ur: 0.2 mg/dL (ref 0.2–1.0)
pH, UA: 6.5 (ref 5.0–7.5)

## 2022-03-10 LAB — MICROALBUMIN / CREATININE URINE RATIO
Creatinine, Urine: 89 mg/dL
Microalb/Creat Ratio: 83 mg/g creat — ABNORMAL HIGH (ref 0–29)
Microalbumin, Urine: 73.9 ug/mL

## 2022-03-10 LAB — MICROSCOPIC EXAMINATION
Bacteria, UA: NONE SEEN
Casts: NONE SEEN /lpf
Epithelial Cells (non renal): >10 /hpf — AB (ref 0–10)

## 2022-03-11 NOTE — Addendum Note (Signed)
Addended by: Erlinda Hong T on: 03/11/2022 11:27 AM   Modules accepted: Orders

## 2022-03-17 LAB — IFE, PE AND FLC, SERUM
Albumin SerPl Elph-Mcnc: 3.7 g/dL (ref 2.9–4.4)
Albumin/Glob SerPl: 1.2 (ref 0.7–1.7)
Alpha 1: 0.2 g/dL (ref 0.0–0.4)
Alpha2 Glob SerPl Elph-Mcnc: 0.6 g/dL (ref 0.4–1.0)
B-Globulin SerPl Elph-Mcnc: 1.2 g/dL (ref 0.7–1.3)
Gamma Glob SerPl Elph-Mcnc: 1.2 g/dL (ref 0.4–1.8)
Globulin, Total: 3.1 g/dL (ref 2.2–3.9)
Ig Kappa Free Light Chain: 31.9 mg/L — ABNORMAL HIGH (ref 3.3–19.4)
Ig Lambda Free Light Chain: 29.2 mg/L — ABNORMAL HIGH (ref 5.7–26.3)
IgA/Immunoglobulin A, Serum: 402 mg/dL (ref 64–422)
IgG (Immunoglobin G), Serum: 1123 mg/dL (ref 586–1602)
IgM (Immunoglobulin M), Srm: 30 mg/dL (ref 26–217)
KAPPA/LAMBDA RATIO: 1.09 (ref 0.26–1.65)
Total Protein: 6.8 g/dL (ref 6.0–8.5)

## 2022-03-17 LAB — CBC

## 2022-03-17 LAB — BMP8+ANION GAP
Anion Gap: 16 mmol/L (ref 10.0–18.0)
BUN/Creatinine Ratio: 27 (ref 12–28)
BUN: 27 mg/dL (ref 8–27)
CO2: 24 mmol/L (ref 20–29)
Calcium: 10.1 mg/dL (ref 8.7–10.3)
Chloride: 101 mmol/L (ref 96–106)
Creatinine, Ser: 1.01 mg/dL — ABNORMAL HIGH (ref 0.57–1.00)
Glucose: 89 mg/dL (ref 70–99)
Potassium: 4.1 mmol/L (ref 3.5–5.2)
Sodium: 141 mmol/L (ref 134–144)
eGFR: 57 mL/min/{1.73_m2} — ABNORMAL LOW (ref >59)

## 2022-04-21 ENCOUNTER — Ambulatory Visit: Payer: Self-pay | Admitting: Licensed Clinical Social Worker

## 2022-04-21 NOTE — Patient Outreach (Signed)
  Care Coordination   04/21/2022 Name: Tina Chase MRN: [id number] DOB: 01/17/1941   Care Coordination Outreach Attempts:  An unsuccessful telephone outreach was attempted today to offer the patient information about available care coordination services as a benefit of their health plan.   Follow Up Plan:  Additional outreach attempts will be made to offer the patient care coordination information and services.   Encounter Outcome:  No Answer  Care Coordination Interventions Activated:  No   Care Coordination Interventions:  No, not indicated    Lenor Derrick , MSW Social Worker [facility]  [phone]

## 2022-06-15 ENCOUNTER — Ambulatory Visit (INDEPENDENT_AMBULATORY_CARE_PROVIDER_SITE_OTHER): Payer: Medicare Other | Admitting: Student in an Organized Health Care Education/Training Program

## 2022-06-15 ENCOUNTER — Encounter: Payer: Self-pay | Admitting: Student in an Organized Health Care Education/Training Program

## 2022-06-15 VITALS — BP 155/60 | HR 73 | Temp 97.6°F | Ht 64.0 in | Wt 264.8 lb

## 2022-06-15 DIAGNOSIS — I129 Hypertensive chronic kidney disease with stage 1 through stage 4 chronic kidney disease, or unspecified chronic kidney disease: Secondary | ICD-10-CM

## 2022-06-15 DIAGNOSIS — D6859 Other primary thrombophilia: Secondary | ICD-10-CM | POA: Diagnosis not present

## 2022-06-15 DIAGNOSIS — H6123 Impacted cerumen, bilateral: Secondary | ICD-10-CM | POA: Diagnosis not present

## 2022-06-15 DIAGNOSIS — I10 Essential (primary) hypertension: Secondary | ICD-10-CM

## 2022-06-15 DIAGNOSIS — Z87891 Personal history of nicotine dependence: Secondary | ICD-10-CM | POA: Diagnosis not present

## 2022-06-15 DIAGNOSIS — N1831 Chronic kidney disease, stage 3a: Secondary | ICD-10-CM

## 2022-06-15 DIAGNOSIS — H6120 Impacted cerumen, unspecified ear: Secondary | ICD-10-CM | POA: Insufficient documentation

## 2022-06-15 MED ORDER — CARBAMIDE PEROXIDE 6.5 % OT SOLN: 5.0000 [drp] | Freq: Two times a day (BID) | OTIC | 2 refills | Status: AC

## 2022-06-15 NOTE — Assessment & Plan Note (Signed)
Blood pressure elevated in the office, but she has normal home readings, this morning was 127/65.  We will plan to continue with amlodipine 10 mg daily and carvedilol 25 mg twice daily.  Unable to use Dyazide due to history of nephrolithiasis and single kidney status.  We used spironolactone for period but she had some hypotension at home.  No ACE or ARB due to history of angioedema.

## 2022-06-15 NOTE — Assessment & Plan Note (Signed)
Bilateral ears were impacted with cerumen which was causing hearing deficits.  We were able to clean out both ears adequately, left eardrum was healthy and intact.  There is still some residual wax in the right ear, could not visualize the eardrum deeply.  Will prescribe Debrox drops to use in the right ear twice daily and can be used in both ears as needed.  Counseled against the use of Q-tips.

## 2022-06-15 NOTE — Assessment & Plan Note (Signed)
Stable chronic kidney disease stable with estimated GFR 57 at last visit.  Renal function seems to have improved after discontinuing Lasix which was being used for bilateral chronic lower extremity edema.  Urine microalbumin only a little elevated around 80.  Not able to tolerate ACEi or ARB due to history of angioedema.  Not quite eligible for SGLT2 inhibitor either given low proteinuria.  We will continue risk factor modification, control blood pressure.  Patient would like to hold off with referral to nephrology which I think is reasonable at this point.  Given advanced age would not be a candidate for renal replacement therapy anyways.

## 2022-06-15 NOTE — Assessment & Plan Note (Signed)
Stable on indefinite anticoagulation with Xarelto 10 mg daily.  No issues with bleeding events.  We will continue.

## 2022-06-15 NOTE — Progress Notes (Signed)
Established Patient Office Visit  Subjective   Patient ID: Tina Chase, female    DOB: 12-12-1940  Age: 81 y.o. MRN: [id number]  Chief Complaint  Patient presents with   Follow-up   Knee Pain   Hip Pain    HPI  81 year old person here for 60-month follow-up of chronic kidney disease.  After our last visit we discontinued Lasix after seeing worsening renal function earlier this year.  She reports doing well since that time, no issues with increased swelling of her lower extremities.  Is feeling some weakness in her bilateral legs, but not function limiting.  Denies any dyspnea with exertion, no chest pain or pressure.  Denies any general sensation of fatigue.  Just says that overall she feels more deconditioned, legs feel little heavier.  She can still walk long distances, but does not exercise on a regular basis.  Good adherence with her medications, no adverse side effects.  Is reporting decreased hearing in both ears, has been trying to clean the with Q-tips.    Objective:     BP (!) 155/60 (BP Location: Left Arm, Patient Position: Sitting, Cuff Size: Normal)   Pulse 73   Temp 97.6 F (36.4 C) (Oral)   Ht 5\' 4"  (1.626 m)   Wt 264 lb 12.8 oz (120.1 kg)   LMP 09/23/1986   SpO2 100%   BMI 45.45 kg/m    Physical Exam  Gen: Well-appearing woman, no distress Ears: Bilateral ear canals are impacted with cerumen, after irrigation of the left tympanic membrane is normal-appearing, right ear canal still had some deep cerumen unable to be removed CV: Regular rate and rhythm, distant heart sounds, no murmurs Lungs: Unlabored, clear to auscultation throughout Ext: Lymphedema in both lower extremities with 2+ nonpitting edema    Assessment & Plan:   Problem List Items Addressed This Visit       High   Hypercoagulable state (HCC) (Chronic)    Stable on indefinite anticoagulation with Xarelto 10 mg daily.  No issues with bleeding events.  We will continue.      Chronic kidney  disease, stage 3a (HCC) (Chronic)    Stable chronic kidney disease stable with estimated GFR 57 at last visit.  Renal function seems to have improved after discontinuing Lasix which was being used for bilateral chronic lower extremity edema.  Urine microalbumin only a little elevated around 80.  Not able to tolerate ACEi or ARB due to history of angioedema.  Not quite eligible for SGLT2 inhibitor either given low proteinuria.  We will continue risk factor modification, control blood pressure.  Patient would like to hold off with referral to nephrology which I think is reasonable at this point.  Given advanced age would not be a candidate for renal replacement therapy anyways.        Medium    Hypertension (Chronic)    Blood pressure elevated in the office, but she has normal home readings, this morning was 127/65.  We will plan to continue with amlodipine 10 mg daily and carvedilol 25 mg twice daily.  Unable to use Dyazide due to history of nephrolithiasis and single kidney status.  We used spironolactone for period but she had some hypotension at home.  No ACE or ARB due to history of angioedema.        Unprioritized   Cerumen impaction - Primary    Bilateral ears were impacted with cerumen which was causing hearing deficits.  We were able to clean out both  ears adequately, left eardrum was healthy and intact.  There is still some residual wax in the right ear, could not visualize the eardrum deeply.  Will prescribe Debrox drops to use in the right ear twice daily and can be used in both ears as needed.  Counseled against the use of Q-tips.       Return in about 3 months (around 09/15/2022).    Tyson Alias, MD

## 2022-06-23 ENCOUNTER — Other Ambulatory Visit: Payer: Self-pay | Admitting: Student in an Organized Health Care Education/Training Program

## 2022-07-23 ENCOUNTER — Telehealth: Payer: Self-pay

## 2022-07-23 NOTE — Patient Outreach (Signed)
  Care Coordination   07/23/2022 Name: Tina Chase MRN: [id number] DOB: 03/07/1941   Care Coordination Outreach Attempts:  A second unsuccessful outreach was attempted today to offer the patient with information about available care coordination services as a benefit of their health plan.     Follow Up Plan:  Additional outreach attempts will be made to offer the patient care coordination information and services.   Encounter Outcome:  No Answer   Care Coordination Interventions:  No, not indicated    Bary Leriche, RN, MSN [facility] Care Management Care Management Coordinator Direct Line [phone]

## 2022-08-07 ENCOUNTER — Other Ambulatory Visit: Payer: Self-pay | Admitting: Student in an Organized Health Care Education/Training Program

## 2022-08-10 ENCOUNTER — Telehealth: Payer: Self-pay

## 2022-08-10 NOTE — Patient Outreach (Signed)
  Care Coordination   08/10/2022 Name: Tina Chase MRN: [id number] DOB: Jul 06, 1941   Care Coordination Outreach Attempts:  A third unsuccessful outreach was attempted today to offer the patient with information about available care coordination services as a benefit of their health plan.   Follow Up Plan:  No further outreach attempts will be made at this time. We have been unable to contact the patient to offer or enroll patient in care coordination services  Encounter Outcome:  No Answer   Care Coordination Interventions:  No, not indicated     Jone Baseman, RN, MSN [place] [facility] Care Management Coordinator Direct Line [phone]

## 2022-08-18 ENCOUNTER — Other Ambulatory Visit: Payer: Self-pay | Admitting: Student in an Organized Health Care Education/Training Program

## 2022-08-18 DIAGNOSIS — Z1231 Encounter for screening mammogram for malignant neoplasm of breast: Secondary | ICD-10-CM

## 2022-09-21 ENCOUNTER — Encounter: Payer: Self-pay | Admitting: Student in an Organized Health Care Education/Training Program

## 2022-09-21 ENCOUNTER — Ambulatory Visit (INDEPENDENT_AMBULATORY_CARE_PROVIDER_SITE_OTHER): Payer: 59 | Admitting: Student in an Organized Health Care Education/Training Program

## 2022-09-21 ENCOUNTER — Ambulatory Visit (INDEPENDENT_AMBULATORY_CARE_PROVIDER_SITE_OTHER): Payer: 59

## 2022-09-21 VITALS — BP 137/61 | HR 62 | Temp 98.0°F | Ht 64.0 in | Wt 260.1 lb

## 2022-09-21 VITALS — BP 155/73 | HR 67 | Temp 98.0°F | Ht 64.0 in | Wt 260.1 lb

## 2022-09-21 DIAGNOSIS — Z905 Acquired absence of kidney: Secondary | ICD-10-CM

## 2022-09-21 DIAGNOSIS — D509 Iron deficiency anemia, unspecified: Secondary | ICD-10-CM | POA: Diagnosis not present

## 2022-09-21 DIAGNOSIS — Z87891 Personal history of nicotine dependence: Secondary | ICD-10-CM | POA: Diagnosis not present

## 2022-09-21 DIAGNOSIS — D6859 Other primary thrombophilia: Secondary | ICD-10-CM | POA: Diagnosis not present

## 2022-09-21 DIAGNOSIS — I10 Essential (primary) hypertension: Secondary | ICD-10-CM

## 2022-09-21 DIAGNOSIS — E790 Hyperuricemia without signs of inflammatory arthritis and tophaceous disease: Secondary | ICD-10-CM

## 2022-09-21 DIAGNOSIS — Z0000 Encounter for general adult medical examination without abnormal findings: Secondary | ICD-10-CM | POA: Diagnosis not present

## 2022-09-21 LAB — POCT GLYCOSYLATED HEMOGLOBIN (HGB A1C): Hemoglobin A1C: 5.1 % (ref 4.0–5.6)

## 2022-09-21 LAB — GLUCOSE, CAPILLARY: Glucose-Capillary: 93 mg/dL (ref 70–99)

## 2022-09-21 NOTE — Assessment & Plan Note (Signed)
History of large unprovoked pulmonary embolism in 2017 on indefinite anticoagulation with Xarelto.  No issues with bleeding.  Will check renal function today.  Plan to continue with Xarelto 10 mg daily.

## 2022-09-21 NOTE — Assessment & Plan Note (Signed)
Patient weighs 260 pounds, BMI of 44.  She is lost 4 pounds since I last saw her.  Very functional and doing well.  Has chronic lymphedema in both legs as a complication of obesity.  She is managing this well with compression stockings and ambulation.

## 2022-09-21 NOTE — Patient Instructions (Signed)

## 2022-09-21 NOTE — Assessment & Plan Note (Signed)
History of iron deficiency anemia around 2019 without clear cause on endoscopic evaluation.  She has had no recent bleeding.  Had 1 iron infusion in the past.  She stopped taking oral iron supplements last year and her iron levels were okay in 2023.  Will check again on the iron levels today and annually to monitor for recurrent iron deficiency.

## 2022-09-21 NOTE — Assessment & Plan Note (Signed)
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  She uses allopurinol for uric acid lowering and potassium citrate to lower risk of future nephrolithiasis.  Will check uric acid and CBC today.  Continue with allopurinol 3 mg daily.  Goal uric acid less than 5.

## 2022-09-21 NOTE — Progress Notes (Signed)
Subjective:   Tina Chase is a 82 y.o. female who presents for an Initial Medicare Annual Wellness Visit.  Review of Systems    DEFER TO PCP.        Objective:    Today's Vitals   09/21/22 1333 09/21/22 1338  BP: (!) 155/73   Pulse: 67   Temp: 98 F (36.7 C)   TempSrc: Oral   SpO2: 96%   Weight: 260 lb 1.6 oz (118 kg)   Height: 5' 4"$  (1.626 m)   PainSc:  0-No pain   Body mass index is 44.65 kg/m.     09/21/2022    1:42 PM 09/21/2022   11:33 AM 06/15/2022   10:09 AM 03/09/2022   10:14 AM 12/01/2021   10:47 AM 05/12/2021   11:32 AM 09/02/2020    9:05 AM  Advanced Directives  Does Patient Have a Medical Advance Directive? No No No No No No No  Would patient like information on creating a medical advance directive? No - Patient declined No - Patient declined No - Patient declined No - Patient declined No - Patient declined No - Patient declined No - Patient declined    Current Medications (verified) Outpatient Encounter Medications as of 09/21/2022  Medication Sig   amLODipine (NORVASC) 10 MG tablet TAKE ONE TABLET BY MOUTH EVERY DAY   acetaminophen (TYLENOL) 325 MG tablet Take 975 mg by mouth every 6 (six) hours as needed for moderate pain or headache.   albuterol (PROVENTIL) (2.5 MG/3ML) 0.083% nebulizer solution Take 3 mLs (2.5 mg total) by nebulization every 6 (six) hours as needed for wheezing or shortness of breath.   albuterol (VENTOLIN HFA) 108 (90 Base) MCG/ACT inhaler INHALE 2 PUFFS INTO THE LUNGS EVERY 4 HOURS AS NEEDED FOR WHEEZING OR SHORTNESS OF BREATH   allopurinol (ZYLOPRIM) 300 MG tablet Take 1 tablet (300 mg total) by mouth at bedtime.   carbamide peroxide (DEBROX) 6.5 % OTIC solution Place 5 drops into the right ear 2 (two) times daily.   carvedilol (COREG) 25 MG tablet TAKE ONE TABLET BY MOUTH TWICE DAILY   FLOVENT HFA 44 MCG/ACT inhaler Inhale 2 puffs into the lungs daily.   Potassium Citrate 15 MEQ (1620 MG) TBCR Take 1 tablet by mouth daily.    XARELTO 10 MG TABS tablet Take 1 tablet (10 mg total) by mouth daily at 10 pm.   No facility-administered encounter medications on file as of 09/21/2022.    Allergies (verified) Hydralazine, Lisinopril, Aspirin, and Crab [shellfish allergy]   History: Past Medical History:  Diagnosis Date   Arthritis    left knee  and left shoulder    Asthma    Chronic kidney disease    right non functioning kidney    Diverticulosis    Hyperlipidemia    Hyperparathyroidism    Hypertension    IDA (iron deficiency anemia)    Kidney atrophy    Myocardial infarction [facility])    patient denies   Obesity    Pica in adults    Pulmonary embolism ([place])    Provoked 2013, Unprovoked 2017   Pulmonary nodule    14m, stable 2013 to 2017, benign   Sleep apnea    CPAP   Small bowel obstruction ([facility]    Past Surgical History:  Procedure Laterality Date   APPLICATION OF WOUND VAC  12/04/2011   Procedure: APPLICATION OF WOUND VAC;  Surgeon: TOdis Hollingshead MD;  Location: [facility];  Service: General;;  x two  BIOPSY  02/26/2020   Procedure: BIOPSY;  Surgeon: Ladene Artist, MD;  Location: [facility];  Service: Endoscopy;;   COLONOSCOPY WITH PROPOFOL N/A 02/26/2020   Procedure: COLONOSCOPY WITH PROPOFOL;  Surgeon: Ladene Artist, MD;  Location: [facility];  Service: Endoscopy;  Laterality: N/A;   DILATION AND CURETTAGE OF UTERUS     D and C x 3    ESOPHAGOGASTRODUODENOSCOPY (EGD) WITH PROPOFOL N/A 02/26/2020   Procedure: ESOPHAGOGASTRODUODENOSCOPY (EGD) WITH PROPOFOL;  Surgeon: Ladene Artist, MD;  Location: [facility];  Service: Endoscopy;  Laterality: N/A;   INCISIONAL HERNIA REPAIR  12/04/2011   Procedure: HERNIA REPAIR INCISIONAL;  Surgeon: Odis Hollingshead, MD;  Location: [facility];  Service: General;  Laterality: N/A;   KIDNEY CYST REMOVAL     LAPAROSCOPIC NEPHRECTOMY  08/19/2011   Procedure: LAPAROSCOPIC NEPHRECTOMY;  Surgeon: Molli Hazard, MD;  Location: [facility];  Service: Urology;   Laterality: Right;   LAPAROTOMY  12/04/2011   Procedure: EXPLORATORY LAPAROTOMY;  Surgeon: Odis Hollingshead, MD;  Location: [facility];  Service: General;  Laterality: N/A;   bowel resection    TUBAL LIGATION     Family History  Problem Relation Age of Onset   Hyperlipidemia Mother    Hypertension Mother    Heart disease Father        had a heart attack at age 60   Heart attack Father 71   Heart disease Brother        at age 74   Heart attack Brother 43   Diabetes Brother    Kidney disease Brother    Hypertension Brother    Hypertension Sister    Diabetes Sister    Hypertension Maternal Grandmother    Arthritis Maternal Grandmother    Heart disease Paternal Grandmother    Hypertension Paternal Grandmother    Kidney disease Brother    HIV/AIDS Brother    Hypertension Brother    Lung cancer Brother    Hypertension Sister    Hypertension Sister    Emphysema Sister    Hypertension Sister    Asthma Son    Cataracts Daughter    Hypertension Daughter    Stroke Neg Hx    Cancer Neg Hx    Nephrolithiasis Neg Hx    Breast cancer Neg Hx    Social History   Socioeconomic History   Marital status: Single    Spouse name: Not on file   Number of children: 5   Years of education: Not on file   Highest education level: Not on file  Occupational History   Occupation: retired    Comment: CNA  Tobacco Use   Smoking status: Former    Packs/day: 0.25    Years: 3.00    Total pack years: 0.75    Types: Cigarettes    Quit date: 08/03/1968    Years since quitting: 54.1   Smokeless tobacco: Never  Vaping Use   Vaping Use: Never used  Substance and Sexual Activity   Alcohol use: No    Alcohol/week: 0.0 standard drinks of alcohol   Drug use: No   Sexual activity: Not Currently  Other Topics Concern   Not on file  Social History Narrative   Not on file   Social Determinants of Health   Financial Resource Strain: Low Risk  (09/21/2022)   Overall Financial Resource Strain (CARDIA)     Difficulty of Paying Living Expenses: Not hard at all  Food Insecurity: No Food Insecurity (09/21/2022)  Hunger Vital Sign    Worried About Running Out of Food in the Last Year: Never true    Ran Out of Food in the Last Year: Never true  Transportation Needs: No Transportation Needs (09/21/2022)   PRAPARE - Hydrologist (Medical): No    Lack of Transportation (Non-Medical): No  Physical Activity: Inactive (09/21/2022)   Exercise Vital Sign    Days of Exercise per Week: 0 days    Minutes of Exercise per Session: 0 min  Stress: No Stress Concern Present (09/21/2022)   [place]    Feeling of Stress : Not at all  Social Connections: Socially Isolated (09/21/2022)   Social Connection and Isolation Panel [NHANES]    Frequency of Communication with Friends and Family: More than three times a week    Frequency of Social Gatherings with Friends and Family: More than three times a week    Attends Religious Services: Never    Marine scientist or Organizations: No    Attends Music therapist: Never    Marital Status: Divorced    Tobacco Counseling Counseling given: Not Answered   Clinical Intake:  Pre-visit preparation completed: Yes  Pain : No/denies pain Pain Score: 0-No pain     BMI - recorded: 44.65 Nutritional Status: BMI > 30  Obese Nutritional Risks: None Diabetes: No  How often do you need to have someone help you when you read instructions, pamphlets, or other written materials from your doctor or pharmacy?: 1 - Never  Diabetic?NO  Interpreter Needed?: No  Information entered by :: Marne Meline, [place] 09/21/2022   Activities of Daily Living    09/21/2022    1:42 PM 09/21/2022   11:32 AM  In your present state of health, do you have any difficulty performing the following activities:  Hearing? 0 0  Vision? 0 0  Difficulty concentrating or making decisions?  0 0  Walking or climbing stairs? 0 0  Dressing or bathing? 0 0  Doing errands, shopping? 0 0    Patient Care Team: Axel Filler, MD as PCP - General (Internal Medicine) Jackolyn Confer, MD as Consulting Physician (General Surgery)  Indicate any recent Medical Services you may have received from other than [facility] providers in the past year (date may be approximate).     Assessment:   This is a routine wellness examination for Mckaylah.  Hearing/Vision screen No results found.  Dietary issues and exercise activities discussed:     Goals Addressed   None   Depression Screen    09/21/2022    1:42 PM 09/21/2022    1:41 PM 09/21/2022   11:38 AM 09/21/2022   11:33 AM 06/15/2022   10:10 AM 12/01/2021   11:14 AM 05/12/2021   11:31 AM  PHQ 2/9 Scores  PHQ - 2 Score 0 0 0 0 0 0 0    Fall Risk    09/21/2022    1:42 PM 09/21/2022   11:32 AM 06/15/2022    9:37 AM 03/09/2022   10:14 AM 12/01/2021   10:45 AM  Fall Risk   Falls in the past year? 0 0 0 0 0  Number falls in past yr: 0 0 0 0 0  Injury with Fall? 0 0 0 0 0  Risk for fall due to : No Fall Risks   No Fall Risks No Fall Risks  Follow up Falls evaluation completed Falls evaluation completed Falls  evaluation completed Falls evaluation completed Falls evaluation completed    FALL RISK PREVENTION PERTAINING TO THE HOME:  Any stairs in or around the home? Yes  If so, are there any without handrails? No  Home free of loose throw rugs in walkways, pet beds, electrical cords, etc? Yes  Adequate lighting in your home to reduce risk of falls? Yes   ASSISTIVE DEVICES UTILIZED TO PREVENT FALLS:  Life alert? No  Use of a cane, walker or w/c? No  Grab bars in the bathroom? No  Shower chair or bench in shower? Yes  Elevated toilet seat or a handicapped toilet? No   TIMED UP AND GO:  Was the test performed? No .  Length of time to ambulate 10 feet: N/A sec.     Cognitive Function:        09/21/2022    1:42 PM   6CIT Screen  What Year? 0 points  What month? 0 points  What time? 0 points  Count back from 20 0 points  Months in reverse 0 points  Repeat phrase 0 points  Total Score 0 points    Immunizations Immunization History  Administered Date(s) Administered   COVID-19, mRNA, vaccine(Comirnaty)12 years and older 05/12/2022   Fluad Quad(high Dose 65+) 05/12/2021   Influenza Split 08/21/2011   Influenza Whole 06/02/2010   Influenza, Seasonal, Injecte, Preservative Fre 07/21/2012   Influenza,inj,Quad PF,6+ Mos 04/10/2014, 07/08/2015, 03/30/2016, 05/10/2017, 05/23/2018, 05/15/2019, 05/06/2020   Influenza-Unspecified 05/12/2022   PFIZER(Purple Top)SARS-COV-2 Vaccination 11/10/2019, 12/01/2019, 08/10/2020   Pneumococcal Conjugate-13 07/08/2015   Pneumococcal Polysaccharide-23 07/01/2009   Td 09/23/2009    TDAP status: Due, Education has been provided regarding the importance of this vaccine. Advised may receive this vaccine at local pharmacy or Health Dept. Aware to provide a copy of the vaccination record if obtained from local pharmacy or Health Dept. Verbalized acceptance and understanding.  Flu Vaccine status: Up to date  Pneumococcal vaccine status: Up to date  Covid-19 vaccine status: Completed vaccines  Qualifies for Shingles Vaccine? Yes   Zostavax completed No   Shingrix Completed?: No.    Education has been provided regarding the importance of this vaccine. Patient has been advised to call insurance company to determine out of pocket expense if they have not yet received this vaccine. Advised may also receive vaccine at local pharmacy or Health Dept. Verbalized acceptance and understanding.  Screening Tests Health Maintenance  Topic Date Due   Hepatitis C Screening  Never done   Zoster Vaccines- Shingrix (1 of 2) Never done   DTaP/Tdap/Td (2 - Tdap) 09/24/2019   Medicare Annual Wellness (AWV)  09/22/2023   Pneumonia Vaccine 53+ Years old  Completed   INFLUENZA VACCINE   Completed   DEXA SCAN  Completed   COVID-19 Vaccine  Completed   HPV VACCINES  Aged Out    Health Maintenance  Health Maintenance Due  Topic Date Due   Hepatitis C Screening  Never done   Zoster Vaccines- Shingrix (1 of 2) Never done   DTaP/Tdap/Td (2 - Tdap) 09/24/2019    Bone Density status: Completed 05/17/2009.  Lung Cancer Screening: (Low Dose CT Chest recommended if Age 36-80 years, 30 pack-year currently smoking OR have quit w/in 15years.) does not qualify.   Lung Cancer Screening Referral: Defer to PCP.   Additional Screening:  Hepatitis C Screening: Defer to PCP.  Vision Screening: Recommended annual ophthalmology exams for early detection of glaucoma and other disorders of the eye. Is the patient up to date  with their annual eye exam?  Yes  Who is the provider or what is the name of the office in which the patient attends annual eye exams? [facility]  If pt is not established with a provider, would they like to be referred to a provider to establish care? No .   Dental Screening: Recommended annual dental exams for proper oral hygiene  Community Resource Referral / Chronic Care Management: CRR required this visit?  No   CCM required this visit?  No      Plan:     I have personally reviewed and noted the following in the patient's chart:   Medical and social history Use of alcohol, tobacco or illicit drugs  Current medications and supplements including opioid prescriptions. Patient is not currently taking opioid prescriptions. Functional ability and status Nutritional status Physical activity Advanced directives List of other physicians Hospitalizations, surgeries, and ER visits in previous 12 months Vitals Screenings to include cognitive, depression, and falls Referrals and appointments  In addition, I have reviewed and discussed with patient certain preventive protocols, quality metrics, and best practice recommendations. A written personalized  care plan for preventive services as well as general preventive health recommendations were provided to patient.     Derika Eckles, CMA   09/21/2022   Nurse Notes: Face to Face.  Ms. Magyar , Thank you for taking time to come for your Medicare Wellness Visit. I appreciate your ongoing commitment to your health goals. Please review the following plan we discussed and let me know if I can assist you in the future.   These are the goals we discussed:  Goals      Blood Pressure < 140/90     LDL CALC < 100        This is a list of the screening recommended for you and due dates:  Health Maintenance  Topic Date Due   Hepatitis C Screening: USPSTF Recommendation to screen - Ages 60-79 yo.  Never done   Zoster (Shingles) Vaccine (1 of 2) Never done   DTaP/Tdap/Td vaccine (2 - Tdap) 09/24/2019   Medicare Annual Wellness Visit  09/22/2023   Pneumonia Vaccine  Completed   Flu Shot  Completed   DEXA scan (bone density measurement)  Completed   COVID-19 Vaccine  Completed   HPV Vaccine  Aged Out

## 2022-09-21 NOTE — Assessment & Plan Note (Signed)
Blood pressure well-controlled today.  Plan to continue with amlodipine 10 mg daily and carvedilol 25 mg twice daily.  Will check BMP today.

## 2022-09-21 NOTE — Progress Notes (Signed)
Established Patient Office Visit  Subjective   Patient ID: Tina Chase, female    DOB: [birth year]  Age: 82 y.o. MRN: [id number]   HPI  82 year old person living with hypercoagulable state here for follow-up of hypertension.  Doing well since I last saw her about 4 months ago.  Last summer we discontinued Lasix after she had issue with acute kidney injury.  She done very well off Lasix and has had no worsening in her lower extremity chronic edema.  Patient has lymphedema as a complication of severe obesity.  She has been using compression stockings with good benefit.  Able to ambulate well.  She is lost about 4 pounds since I last saw her.  She lives by herself, independent in all her activities of daily living.  No longer drives, uses medical transportation to get to her office visits.  Good adherence with her medications, no adverse side effects.  No falls at home.  No recent fevers or chills or hospitalizations.  Denies any chest pain, pressure, or dyspnea on exertion.  Overall she says she feels really well, is happy with her functional level.  Denies isolation or depressed mood.    Objective:     BP 137/61 (BP Location: Right Arm, Patient Position: Sitting, Cuff Size: Small)   Pulse 62   Temp 98 F (36.7 C) (Oral)   Ht 5' 4"$  (1.626 m)   Wt 260 lb 1.6 oz (118 kg)   LMP 09/23/1986   SpO2 96%   BMI 44.65 kg/m    Physical Exam  General: Well-appearing woman, no distress Neck: No lymphadenopathy, no thyromegaly CV: 2 out of 6 early systolic murmur at the right upper sternal border Lungs: Clear throughout, no wheezing or crackles Extremities: 2+ chronic nonpitting edema in both lower extremities, otherwise normal joints Neuro: Alert, conversational, full strength in the upper and lower extremities, mildly slow get up and go, mildly hunched over gait with a little bit of a shuffle, but overall pretty good mobility.    Assessment & Plan:   Problem List Items Addressed This  Visit       High   Hypercoagulable state ([place]) (Chronic)    History of large unprovoked pulmonary embolism in 2017 on indefinite anticoagulation with Xarelto.  No issues with bleeding.  Will check renal function today.  Plan to continue with Xarelto 10 mg daily.        Medium    Hypertension (Chronic)    Blood pressure well-controlled today.  Plan to continue with amlodipine 10 mg daily and carvedilol 25 mg twice daily.  Will check BMP today.      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  She is lost 4 pounds since I last saw her.  Very functional and doing well.  Has chronic lymphedema in both legs as a complication of obesity.  She is managing this well with compression stockings and ambulation.      Relevant Orders   POC Hbg A1C   Hyperuricemia (Chronic)    History of significant nephrolithiasis resulting in single kidney state.  She uses allopurinol for uric acid lowering and potassium citrate to lower risk of future nephrolithiasis.  Will check uric acid and CBC today.  Continue with allopurinol 3 mg daily.  Goal uric acid less than 5.      Relevant Orders   Uric acid  Low   Single kidney (Chronic)     Unprioritized   Iron deficiency anemia - Primary    History of iron deficiency anemia around 2019 without clear cause on endoscopic evaluation.  She has had no recent bleeding.  Had 1 iron infusion in the past.  She stopped taking oral iron supplements last year and her iron levels were okay in 2023.  Will check again on the iron levels today and annually to monitor for recurrent iron deficiency.      Relevant Orders   CBC no Diff   Iron and IBC [id number])   Ferritin    Return in about 6 months (around 03/22/2023).    Axel Filler, MD

## 2022-09-22 LAB — CBC
Hematocrit: 37.1 % (ref 34.0–46.6)
Hemoglobin: 11.7 g/dL (ref 11.1–15.9)
MCH: 30.9 pg (ref 26.6–33.0)
MCHC: 31.5 g/dL (ref 31.5–35.7)
MCV: 98 fL — ABNORMAL HIGH (ref 79–97)
Platelets: 207 10*3/uL (ref 150–450)
RBC: 3.79 x10E6/uL (ref 3.77–5.28)
RDW: 13.4 % (ref 11.7–15.4)
WBC: 5.8 10*3/uL (ref 3.4–10.8)

## 2022-09-22 LAB — BMP8+ANION GAP
Anion Gap: 16 mmol/L (ref 10.0–18.0)
BUN/Creatinine Ratio: 28 (ref 12–28)
BUN: 26 mg/dL (ref 8–27)
CO2: 21 mmol/L (ref 20–29)
Calcium: 10.1 mg/dL (ref 8.7–10.3)
Chloride: 101 mmol/L (ref 96–106)
Creatinine, Ser: 0.94 mg/dL (ref 0.57–1.00)
Glucose: 88 mg/dL (ref 70–99)
Potassium: 4.2 mmol/L (ref 3.5–5.2)
Sodium: 138 mmol/L (ref 134–144)
eGFR: 62 mL/min/{1.73_m2} (ref >59)

## 2022-09-22 LAB — IRON AND TIBC
Iron Saturation: 13 % — ABNORMAL LOW (ref 15–55)
Iron: 44 ug/dL (ref 27–139)
Total Iron Binding Capacity: 342 ug/dL (ref 250–450)
UIBC: 298 ug/dL (ref 118–369)

## 2022-09-22 LAB — URIC ACID: Uric Acid: 2.6 mg/dL — ABNORMAL LOW (ref 3.1–7.9)

## 2022-09-22 LAB — FERRITIN: Ferritin: 39 ng/mL (ref 15–150)

## 2022-09-23 ENCOUNTER — Encounter: Payer: Self-pay | Admitting: Student in an Organized Health Care Education/Training Program

## 2022-09-23 ENCOUNTER — Other Ambulatory Visit: Payer: Self-pay | Admitting: Student in an Organized Health Care Education/Training Program

## 2022-09-24 DIAGNOSIS — Z961 Presence of intraocular lens: Secondary | ICD-10-CM | POA: Diagnosis not present

## 2022-09-24 DIAGNOSIS — H1045 Other chronic allergic conjunctivitis: Secondary | ICD-10-CM | POA: Diagnosis not present

## 2022-09-24 DIAGNOSIS — H04123 Dry eye syndrome of bilateral lacrimal glands: Secondary | ICD-10-CM | POA: Diagnosis not present

## 2022-09-24 DIAGNOSIS — H353131 Nonexudative age-related macular degeneration, bilateral, early dry stage: Secondary | ICD-10-CM | POA: Diagnosis not present

## 2022-10-07 ENCOUNTER — Ambulatory Visit
Admission: RE | Admit: 2022-10-07 | Discharge: 2022-10-07 | Disposition: A | Discharge status: Home / Self Care | Payer: 59 | Source: Ambulatory Visit | Attending: Student in an Organized Health Care Education/Training Program | Admitting: Student in an Organized Health Care Education/Training Program

## 2022-10-07 DIAGNOSIS — Z1231 Encounter for screening mammogram for malignant neoplasm of breast: Secondary | ICD-10-CM | POA: Diagnosis not present

## 2022-12-22 ENCOUNTER — Other Ambulatory Visit: Payer: Self-pay | Admitting: Student in an Organized Health Care Education/Training Program

## 2022-12-22 DIAGNOSIS — E790 Hyperuricemia without signs of inflammatory arthritis and tophaceous disease: Secondary | ICD-10-CM

## 2023-01-11 ENCOUNTER — Other Ambulatory Visit: Payer: Self-pay | Admitting: Student in an Organized Health Care Education/Training Program

## 2023-02-03 ENCOUNTER — Other Ambulatory Visit: Payer: Self-pay | Admitting: Student in an Organized Health Care Education/Training Program

## 2023-03-22 ENCOUNTER — Encounter: Payer: Self-pay | Admitting: Student in an Organized Health Care Education/Training Program

## 2023-03-22 ENCOUNTER — Ambulatory Visit (INDEPENDENT_AMBULATORY_CARE_PROVIDER_SITE_OTHER): Payer: 59 | Admitting: Student in an Organized Health Care Education/Training Program

## 2023-03-22 ENCOUNTER — Telehealth: Payer: Self-pay

## 2023-03-22 VITALS — BP 132/75 | HR 64 | Temp 98.0°F | Ht 64.0 in | Wt 266.5 lb

## 2023-03-22 DIAGNOSIS — Z87891 Personal history of nicotine dependence: Secondary | ICD-10-CM | POA: Diagnosis not present

## 2023-03-22 DIAGNOSIS — I10 Essential (primary) hypertension: Secondary | ICD-10-CM | POA: Diagnosis not present

## 2023-03-22 DIAGNOSIS — R600 Localized edema: Secondary | ICD-10-CM | POA: Diagnosis not present

## 2023-03-22 DIAGNOSIS — H6123 Impacted cerumen, bilateral: Secondary | ICD-10-CM

## 2023-03-22 DIAGNOSIS — N200 Calculus of kidney: Secondary | ICD-10-CM

## 2023-03-22 MED ORDER — POTASSIUM CITRATE-CITRIC ACID 1100-334 MG/5ML PO SOLN: 15.0000 mL | Freq: Every day | ORAL | 2 refills | Status: DC

## 2023-03-22 MED ORDER — FUROSEMIDE 40 MG PO TABS: 40.0000 mg | ORAL_TABLET | Freq: Every day | ORAL | 3 refills | Status: DC

## 2023-03-22 NOTE — Assessment & Plan Note (Signed)
Bilateral ear canals are again impacted with cerumen.  We did irrigation in the office today.  I counseled against using Q-tips and offer Debrox drops to use as maintenance at home.  She also uses mineral oil.

## 2023-03-22 NOTE — Assessment & Plan Note (Signed)
Long history of bilateral lower extremity lymphedema due to obesity.  Symptomatically doing worse over the last few months after we discontinued high-dose Lasix due to some mild renal impairment.  We talked about supportive care and she is using compression and ambulation but still having some discomfort due to the swelling.  We decided to try using Lasix again, but I am going to decrease the dose to 40 mg once daily.  She will come back in about 2 weeks for a BMP for close monitoring.

## 2023-03-22 NOTE — Assessment & Plan Note (Signed)
Blood pressure well-controlled today at 132/75.  Plan to continue with amlodipine and carvedilol.
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
## 2023-03-22 NOTE — Telephone Encounter (Signed)
Pa for pt (  POTASSIUK CITRATE ) came through on cover my meds was submitted with office notes .Marland Kitchen Awaiting approval or denial

## 2023-03-22 NOTE — Assessment & Plan Note (Signed)
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  We are using potassium citrate for urine alkalinization to lower the risk of future uric acid stones.  She is having trouble absorbing the potassium citrate tablets.  I am going to switch to the citric acid liquid.  Will continue with allopurinol 3 mg daily and uric acid is at goal.

## 2023-03-23 NOTE — Telephone Encounter (Signed)
DECISION :    K CITRATE SOL CITR ACD is not covered. The requested medication with the submitted Generic Product Identifier (GPI)/National Drug Code [facility]) is not properly listed with the Food and Drug Administration (FDA) and does not meet regulatory requirements that would constitute a Part D-eligible drug. Therefore, the medication is not covered under your Part D prescription drug plan. Reviewed by: Jearl Klinefelter, R.Ph              COPY PLACED TO SCAN TO CHART  AND PCP NOTIFIED )

## 2023-04-06 ENCOUNTER — Other Ambulatory Visit (INDEPENDENT_AMBULATORY_CARE_PROVIDER_SITE_OTHER): Payer: 59

## 2023-04-06 DIAGNOSIS — R600 Localized edema: Secondary | ICD-10-CM | POA: Diagnosis not present

## 2023-04-07 LAB — BMP8+ANION GAP
Anion Gap: 13 mmol/L (ref 10.0–18.0)
BUN/Creatinine Ratio: 24 (ref 12–28)
BUN: 27 mg/dL (ref 8–27)
CO2: 29 mmol/L (ref 20–29)
Calcium: 10.3 mg/dL (ref 8.7–10.3)
Chloride: 100 mmol/L (ref 96–106)
Creatinine, Ser: 1.14 mg/dL — ABNORMAL HIGH (ref 0.57–1.00)
Glucose: 96 mg/dL (ref 70–99)
Potassium: 4.3 mmol/L (ref 3.5–5.2)
Sodium: 142 mmol/L (ref 134–144)
eGFR: 49 mL/min/{1.73_m2} — ABNORMAL LOW (ref >59)

## 2023-04-07 NOTE — Addendum Note (Signed)
Addended by: Erlinda Hong T on: 04/07/2023 03:26 PM   Modules accepted: Orders

## 2023-06-18 ENCOUNTER — Other Ambulatory Visit: Payer: Self-pay | Admitting: Student in an Organized Health Care Education/Training Program

## 2023-07-19 ENCOUNTER — Encounter: Payer: Self-pay | Admitting: Student in an Organized Health Care Education/Training Program

## 2023-07-19 ENCOUNTER — Ambulatory Visit (INDEPENDENT_AMBULATORY_CARE_PROVIDER_SITE_OTHER): Payer: 59 | Admitting: Student in an Organized Health Care Education/Training Program

## 2023-07-19 VITALS — BP 144/71 | HR 71 | Temp 98.2°F | Ht 64.0 in | Wt 271.0 lb

## 2023-07-19 DIAGNOSIS — N1831 Chronic kidney disease, stage 3a: Secondary | ICD-10-CM

## 2023-07-19 DIAGNOSIS — I10 Essential (primary) hypertension: Secondary | ICD-10-CM

## 2023-07-19 DIAGNOSIS — D6859 Other primary thrombophilia: Secondary | ICD-10-CM

## 2023-07-19 DIAGNOSIS — I890 Lymphedema, not elsewhere classified: Secondary | ICD-10-CM | POA: Diagnosis not present

## 2023-07-19 DIAGNOSIS — D509 Iron deficiency anemia, unspecified: Secondary | ICD-10-CM | POA: Diagnosis not present

## 2023-07-19 DIAGNOSIS — I129 Hypertensive chronic kidney disease with stage 1 through stage 4 chronic kidney disease, or unspecified chronic kidney disease: Secondary | ICD-10-CM | POA: Diagnosis not present

## 2023-07-19 DIAGNOSIS — E669 Obesity, unspecified: Secondary | ICD-10-CM

## 2023-07-19 NOTE — Assessment & Plan Note (Signed)
Major concern today of worsening lymphedema.  On exam there are no wounds or skin breakdown.  There is some stasis dermatitis of the right leg.  She is struggling to manage this on her own at home with compression.  We held diuresis at last visit because of slightly worsening renal function.  Will going to recheck renal function today and see if we can add back Lasix carefully.  Going to refer the patient to lymphedema management services with occupational therapy for better compression wrapping and to teach her about self-management.  At this point the lymphedema is impacting her quality of life.  We talked about having reasonable expectations.

## 2023-07-19 NOTE — Assessment & Plan Note (Signed)
Chronic and stable.  Doing well on Xarelto.  Continues to have increased risk of future VTE.  Will continue with Xarelto 10 mg daily.

## 2023-07-19 NOTE — Addendum Note (Signed)
Addended by: Maura Crandall on: 07/19/2023 11:39 AM   Modules accepted: Orders

## 2023-07-19 NOTE — Assessment & Plan Note (Signed)
History of iron deficiency anemia, unclear etiology.  Will check iron levels and CBC today.  May need to consider iron infusion.

## 2023-07-19 NOTE — Assessment & Plan Note (Signed)
Chronic and stable.  Blood pressure is okay today.  Will continue with amlodipine 10 mg and carvedilol 25 mg twice daily.  Unable to escalate treatment at this time because of bad reactions to ACE inhibitor, ARB, MRA, and thiazide in the past.

## 2023-07-19 NOTE — Addendum Note (Signed)
Addended by: Erlinda Hong T on: 07/19/2023 12:11 PM   Modules accepted: Orders

## 2023-07-19 NOTE — Assessment & Plan Note (Signed)
Chronic and stable.  Plan to check labs today.

## 2023-07-19 NOTE — Progress Notes (Signed)
Established Patient Office Visit  Subjective   Patient ID: Tina Chase, female    DOB: February 19, 1941  Age: 82 y.o. MRN: [id number]  Chief Complaint  Patient presents with   Follow-up   Shoulder Pain    HPI  82 year old person here for management of hypertension and lymphedema in both of her legs.  Doing okay since I last saw her about 3 months ago.  No falls or acute illness.  No hospitalizations or ED visits.  Her major concern today is increased swelling in both of her lower extremities.  Says they feel more tight and uncomfortable.  Preventing her from walking as much as usual.  Unable to tolerate home compression stockings, says they are uncomfortable and ineffective.  Interested in using Lasix again which has been helpful in the past, but we were monitoring her renal function very carefully.  No other acute illness.  Has not noticed any skin breakdown.    Objective:     BP (!) 144/71 (BP Location: Left Arm, Patient Position: Sitting, Cuff Size: Small)   Pulse 71   Temp 98.2 F (36.8 C) (Oral)   Ht 5\' 4"  (1.626 m)   Wt 271 lb (122.9 kg)   LMP 09/23/1986   BMI 46.52 kg/m    Physical Exam  Gen: Well-appearing woman, no distress Heart: Regular, 3 out of 6 early systolic murmur best heard at the right upper sternal border Extremities: Bilateral large lymphedema with mixed lipedema.  This is nonpitting in both legs.  On the right leg there is erythema and warmth in the distal leg.  No skin breakdown, no ulcers or wounds. Psych: Appropriate mood and affect, not depressed or anxious.    Assessment & Plan:   Problem List Items Addressed This Visit       High   Hypercoagulable state (HCC) (Chronic)   Chronic and stable.  Doing well on Xarelto.  Continues to have increased risk of future VTE.  Will continue with Xarelto 10 mg daily.      Chronic kidney disease, stage 3a (HCC) - Primary (Chronic)   Chronic and stable.  Plan to check labs today.      Relevant Orders    BMP8+Anion Gap     Medium    Hypertension (Chronic)   Chronic and stable.  Blood pressure is okay today.  Will continue with amlodipine 10 mg and carvedilol 25 mg twice daily.  Unable to escalate treatment at this time because of bad reactions to ACE inhibitor, ARB, MRA, and thiazide in the past.      Relevant Medications   furosemide (LASIX) 40 MG tablet   Lymphedema associated with obesity (Chronic)   Major concern today of worsening lymphedema.  On exam there are no wounds or skin breakdown.  There is some stasis dermatitis of the right leg.  She is struggling to manage this on her own at home with compression.  We held diuresis at last visit because of slightly worsening renal function.  Will going to recheck renal function today and see if we can add back Lasix carefully.  Going to refer the patient to lymphedema management services with occupational therapy for better compression wrapping and to teach her about self-management.  At this point the lymphedema is impacting her quality of life.  We talked about having reasonable expectations.        Unprioritized   Iron deficiency anemia   History of iron deficiency anemia, unclear etiology.  Will check iron levels and  CBC today.  May need to consider iron infusion.      Relevant Orders   CBC no Diff   Iron and IBC ([id number])   Ferritin    Return in about 3 months (around 10/17/2023).    Tyson Alias, MD

## 2023-07-19 NOTE — Patient Instructions (Addendum)
For the swelling in your legs, we will call you tomorrow about the results of the blood work and if you can take the Lasix.  We have placed the referral to the Lymphedema clinic to get you evaluated for stockings and other support they can provide.   For the shoulder, make sure to keep moving that arm within a painless range of motion. If it gets worse to the point it is interfering with things you'd like to do and you are interested in steroid injections to reduce the inflammation, please let us know.

## 2023-07-20 LAB — IRON AND TIBC
Iron Saturation: 23 % (ref 15–55)
Iron: 85 ug/dL (ref 27–139)
Total Iron Binding Capacity: 365 ug/dL (ref 250–450)
UIBC: 280 ug/dL (ref 118–369)

## 2023-07-20 LAB — CBC
Hematocrit: 38.6 % (ref 34.0–46.6)
Hemoglobin: 12.3 g/dL (ref 11.1–15.9)
MCH: 31.4 pg (ref 26.6–33.0)
MCHC: 31.9 g/dL (ref 31.5–35.7)
MCV: 99 fL — ABNORMAL HIGH (ref 79–97)
Platelets: 226 10*3/uL (ref 150–450)
RBC: 3.92 x10E6/uL (ref 3.77–5.28)
RDW: 13.1 % (ref 11.7–15.4)
WBC: 6.3 10*3/uL (ref 3.4–10.8)

## 2023-07-20 LAB — BMP8+ANION GAP
Anion Gap: 12 mmol/L (ref 10.0–18.0)
BUN/Creatinine Ratio: 22 (ref 12–28)
BUN: 21 mg/dL (ref 8–27)
CO2: 26 mmol/L (ref 20–29)
Calcium: 10.2 mg/dL (ref 8.7–10.3)
Chloride: 102 mmol/L (ref 96–106)
Creatinine, Ser: 0.96 mg/dL (ref 0.57–1.00)
Glucose: 95 mg/dL (ref 70–99)
Potassium: 4.4 mmol/L (ref 3.5–5.2)
Sodium: 140 mmol/L (ref 134–144)
eGFR: 60 mL/min/{1.73_m2} (ref >59)

## 2023-07-20 LAB — FERRITIN: Ferritin: 54 ng/mL (ref 15–150)

## 2023-08-03 ENCOUNTER — Encounter: Payer: Self-pay | Admitting: *Deleted

## 2023-08-10 ENCOUNTER — Other Ambulatory Visit: Payer: Self-pay | Admitting: Student in an Organized Health Care Education/Training Program

## 2023-08-24 ENCOUNTER — Other Ambulatory Visit: Payer: Self-pay

## 2023-08-24 DIAGNOSIS — J4520 Mild intermittent asthma, uncomplicated: Secondary | ICD-10-CM

## 2023-08-24 MED ORDER — ALBUTEROL SULFATE (2.5 MG/3ML) 0.083% IN NEBU: 2.5000 mg | INHALATION_SOLUTION | Freq: Four times a day (QID) | RESPIRATORY_TRACT | 12 refills | Status: AC | PRN

## 2023-08-24 MED ORDER — FLUTICASONE PROPIONATE HFA 44 MCG/ACT IN AERO: 2.0000 | INHALATION_SPRAY | Freq: Every day | RESPIRATORY_TRACT | 1 refills | Status: DC

## 2023-08-24 MED ORDER — ALBUTEROL SULFATE HFA 108 (90 BASE) MCG/ACT IN AERS: INHALATION_SPRAY | RESPIRATORY_TRACT | 1 refills | Status: DC

## 2023-08-25 ENCOUNTER — Telehealth: Payer: Self-pay

## 2023-08-25 NOTE — Telephone Encounter (Signed)
Decision:Approved  Roslynn Kegley (Key: [id number]) PA Case ID #: [id number] Rx #: [id number] Need Help? Call us at [phone] Outcome Approved today by [facility] [place] [employer] 2017 NCPDP Request Reference Number: [id number]. FLUTICAS HFA AER is approved through 08/02/2024. Your patient may now fill this prescription and it will be covered. Authorization Expiration Date: 08/02/2024 Drug Fluticasone Propionate HFA 44MCG/ACT aerosol ePA cloud logo Form [employer] Medicare Part D Electronic Prior Authorization Form (2017 NCPDP) Original Claim Info [id number] Provide Exception Process Printed NoticeNon-Form, Dr. Augustine Radar ePA at [email or web address];QVAR REDIHALER PREF'D

## 2023-08-25 NOTE — Telephone Encounter (Signed)
Prior Authorization for patient (Fluticasone Propionate HFA 44MCG/ACT aerosol) came through on cover my meds was submitted with last office notes awaiting approval or denial.  ZOX:[id number]

## 2023-09-09 ENCOUNTER — Other Ambulatory Visit: Payer: Self-pay | Admitting: Student in an Organized Health Care Education/Training Program

## 2023-09-09 DIAGNOSIS — Z1231 Encounter for screening mammogram for malignant neoplasm of breast: Secondary | ICD-10-CM

## 2023-09-13 ENCOUNTER — Other Ambulatory Visit: Payer: Self-pay | Admitting: Student in an Organized Health Care Education/Training Program

## 2023-09-24 ENCOUNTER — Other Ambulatory Visit: Payer: Self-pay | Admitting: Student in an Organized Health Care Education/Training Program

## 2023-10-07 DIAGNOSIS — H1045 Other chronic allergic conjunctivitis: Secondary | ICD-10-CM | POA: Diagnosis not present

## 2023-10-07 DIAGNOSIS — Z961 Presence of intraocular lens: Secondary | ICD-10-CM | POA: Diagnosis not present

## 2023-10-07 DIAGNOSIS — H353131 Nonexudative age-related macular degeneration, bilateral, early dry stage: Secondary | ICD-10-CM | POA: Diagnosis not present

## 2023-10-12 ENCOUNTER — Ambulatory Visit
Admission: RE | Admit: 2023-10-12 | Discharge: 2023-10-12 | Disposition: A | Discharge status: Home / Self Care | Payer: 59 | Source: Ambulatory Visit | Attending: Student in an Organized Health Care Education/Training Program | Admitting: Student in an Organized Health Care Education/Training Program

## 2023-10-12 DIAGNOSIS — Z1231 Encounter for screening mammogram for malignant neoplasm of breast: Secondary | ICD-10-CM | POA: Diagnosis not present

## 2023-11-01 ENCOUNTER — Other Ambulatory Visit: Payer: Self-pay | Admitting: Student in an Organized Health Care Education/Training Program

## 2023-11-09 ENCOUNTER — Other Ambulatory Visit: Payer: Self-pay | Admitting: Student in an Organized Health Care Education/Training Program

## 2023-11-17 ENCOUNTER — Encounter: Admitting: Internal Medicine

## 2023-11-24 ENCOUNTER — Ambulatory Visit

## 2023-11-24 VITALS — Wt 261.0 lb

## 2023-11-24 DIAGNOSIS — Z0000 Encounter for general adult medical examination without abnormal findings: Secondary | ICD-10-CM | POA: Diagnosis not present

## 2023-11-24 NOTE — Progress Notes (Signed)
 I reviewed the AWV findings of the licensed medical professional who conducted the visit. I was present in the office suite and immediately available to provide assistance and direction throughout the time the service was provided.

## 2023-11-24 NOTE — Progress Notes (Signed)
 Because this visit was a virtual/telehealth visit,  certain criteria was not obtained, such a blood pressure, CBG if applicable, and timed get up and go. Any medications not marked as "taking" were not mentioned during the medication reconciliation part of the visit. Any vitals not documented were not able to be obtained due to this being a telehealth visit or patient was unable to self-report a recent blood pressure reading due to a lack of equipment at home via telehealth. Vitals that have been documented are verbally provided by the patient.   Subjective:   DAILYNN Chase is a 83 y.o. who presents for a Medicare Wellness preventive visit.  Visit Complete: Virtual I connected with  Tina Chase on 11/24/23 by a audio enabled telemedicine application and verified that I am speaking with the correct person using two identifiers.  Patient Location: Home  Provider Location: Office/Clinic  I discussed the limitations of evaluation and management by telemedicine. The patient expressed understanding and agreed to proceed.  Vital Signs: Because this visit was a virtual/telehealth visit, some criteria may be missing or patient reported. Any vitals not documented were not able to be obtained and vitals that have been documented are patient reported.  VideoDeclined- This patient declined Librarian, academic. Therefore the visit was completed with audio only.  Persons Participating in Visit: Patient.  AWV Questionnaire: No: Patient Medicare AWV questionnaire was not completed prior to this visit.  Cardiac Risk Factors include: advanced age (>107men, >66 women);sedentary lifestyle;dyslipidemia;hypertension;obesity (BMI >30kg/m2);family history of premature cardiovascular disease     Objective:    Today's Vitals   11/24/23 1214  Weight: 261 lb (118.4 kg)  PainSc: 6   PainLoc: Knee   Body mass index is 44.8 kg/m.     11/24/2023   12:17 PM 03/22/2023   10:53 AM  09/21/2022    1:42 PM 09/21/2022   11:33 AM 06/15/2022   10:09 AM 03/09/2022   10:14 AM 12/01/2021   10:47 AM  Advanced Directives  Does Patient Have a Medical Advance Directive? No No No No No No No  Would patient like information on creating a medical advance directive? No - Patient declined No - Patient declined No - Patient declined No - Patient declined No - Patient declined No - Patient declined No - Patient declined    Current Medications (verified) Outpatient Encounter Medications as of 11/24/2023  Medication Sig   acetaminophen  (TYLENOL ) 325 MG tablet Take 975 mg by mouth every 6 (six) hours as needed for moderate pain or headache.   albuterol  (PROVENTIL ) (2.5 MG/3ML) 0.083% nebulizer solution Take 3 mLs (2.5 mg total) by nebulization every 6 (six) hours as needed for wheezing or shortness of breath.   albuterol  (VENTOLIN  HFA) 108 (90 Base) MCG/ACT inhaler INHALE 2 PUFFS INTO THE LUNGS EVERY 4 HOURS AS NEEDED FOR WHEEZING OR SHORTNESS OF BREATH   allopurinol  (ZYLOPRIM ) 300 MG tablet TAKE ONE TABLET BY MOUTH AT BEDTIME   amLODipine  (NORVASC ) 10 MG tablet TAKE ONE TABLET BY MOUTH EVERY DAY   carvedilol  (COREG ) 25 MG tablet TAKE ONE TABLET BY MOUTH TWICE DAILY   citric acid -potassium citrate  (POLYCITRA) 1100-334 MG/5ML solution Take 15 mLs (30 mEq total) by mouth daily.   fluticasone  (FLOVENT  HFA) 44 MCG/ACT inhaler Inhale 2 puffs into the lungs daily.   furosemide  (LASIX ) 40 MG tablet Take 40 mg by mouth daily.   Potassium Citrate  15 MEQ (1620 MG) TBCR TAKE ONE TABLET BY MOUTH EVERY DAY   XARELTO   10 MG TABS tablet TAKE ONE TABLET BY MOUTH EVERY DAY AT 10PM   No facility-administered encounter medications on file as of 11/24/2023.    Allergies (verified) Hydralazine, Lisinopril, Aspirin, and Crab [shellfish allergy]   History: Past Medical History:  Diagnosis Date   Arthritis    left knee  and left shoulder    Asthma    Chronic kidney disease    right non functioning kidney     Diverticulosis    Hyperlipidemia    Hyperparathyroidism    Hypertension    IDA (iron  deficiency anemia)    Kidney atrophy    Myocardial infarction [facility])    patient denies   Obesity    Pica in adults    Pulmonary embolism (HCC)    Provoked 2013, Unprovoked 2017   Pulmonary nodule    12mm, stable 2013 to 2017, benign   Sleep apnea    CPAP   Small bowel obstruction [facility])    Past Surgical History:  Procedure Laterality Date   APPLICATION OF WOUND VAC  12/04/2011   Procedure: APPLICATION OF WOUND VAC;  Surgeon: Harlee Lichtenstein, MD;  Location: [facility];  Service: General;;  x two   BIOPSY  02/26/2020   Procedure: BIOPSY;  Surgeon: Asencion Blacksmith, MD;  Location: [facility];  Service: Endoscopy;;   COLONOSCOPY WITH PROPOFOL  N/A 02/26/2020   Procedure: COLONOSCOPY WITH PROPOFOL ;  Surgeon: Asencion Blacksmith, MD;  Location: [facility];  Service: Endoscopy;  Laterality: N/A;   DILATION AND CURETTAGE OF UTERUS     D and C x 3    ESOPHAGOGASTRODUODENOSCOPY (EGD) WITH PROPOFOL  N/A 02/26/2020   Procedure: ESOPHAGOGASTRODUODENOSCOPY (EGD) WITH PROPOFOL ;  Surgeon: Asencion Blacksmith, MD;  Location: [facility];  Service: Endoscopy;  Laterality: N/A;   INCISIONAL HERNIA REPAIR  12/04/2011   Procedure: HERNIA REPAIR INCISIONAL;  Surgeon: Harlee Lichtenstein, MD;  Location: [facility];  Service: General;  Laterality: N/A;   KIDNEY CYST REMOVAL     LAPAROSCOPIC NEPHRECTOMY  08/19/2011   Procedure: LAPAROSCOPIC NEPHRECTOMY;  Surgeon: Soledad Dupes, MD;  Location: [facility];  Service: Urology;  Laterality: Right;   LAPAROTOMY  12/04/2011   Procedure: EXPLORATORY LAPAROTOMY;  Surgeon: Harlee Lichtenstein, MD;  Location: [facility];  Service: General;  Laterality: N/A;   bowel resection    TUBAL LIGATION     Family History  Problem Relation Age of Onset   Hyperlipidemia Mother    Hypertension Mother    Heart disease Father        had a heart attack at age 78   Heart attack Father 70   Heart disease Brother         at age 41   Heart attack Brother 17   Diabetes Brother    Kidney disease Brother    Hypertension Brother    Hypertension Sister    Diabetes Sister    Hypertension Maternal Grandmother    Arthritis Maternal Grandmother    Heart disease Paternal Grandmother    Hypertension Paternal Grandmother    Kidney disease Brother    HIV/AIDS Brother    Hypertension Brother    Lung cancer Brother    Hypertension Sister    Hypertension Sister    Emphysema Sister    Hypertension Sister    Asthma Son    Cataracts Tina Chase    Hypertension Tina Chase    Stroke Neg Hx    Cancer Neg Hx    Nephrolithiasis Neg Hx    Breast cancer  Neg Hx    Social History   Socioeconomic History   Marital status: Single    Spouse name: Not on file   Number of children: 5   Years of education: Not on file   Highest education level: Not on file  Occupational History   Occupation: retired    Comment: CNA  Tobacco Use   Smoking status: Former    Current packs/day: 0.00    Average packs/day: 0.3 packs/day for 3.0 years (0.8 ttl pk-yrs)    Types: Cigarettes    Start date: 08/03/1965    Quit date: 08/03/1968    Years since quitting: 55.3   Smokeless tobacco: Never  Vaping Use   Vaping status: Never Used  Substance and Sexual Activity   Alcohol use: No    Alcohol/week: 0.0 standard drinks of alcohol   Drug use: No   Sexual activity: Not Currently  Other Topics Concern   Not on file  Social History Narrative   Not on file   Social Drivers of Health   Financial Resource Strain: Low Risk  (11/24/2023)   Overall Financial Resource Strain (CARDIA)    Difficulty of Paying Living Expenses: Not hard at all  Food Insecurity: No Food Insecurity (11/24/2023)   Hunger Vital Sign    Worried About Running Out of Food in the Last Year: Never true    Ran Out of Food in the Last Year: Never true  Transportation Needs: No Transportation Needs (11/24/2023)   PRAPARE - Administrator, Civil Service  (Medical): No    Lack of Transportation (Non-Medical): No  Physical Activity: Inactive (11/24/2023)   Exercise Vital Sign    Days of Exercise per Week: 0 days    Minutes of Exercise per Session: 0 min  Stress: No Stress Concern Present (11/24/2023)   [employer] of Occupational Health - Occupational Stress Questionnaire    Feeling of Stress : Not at all  Social Connections: Socially Isolated (11/24/2023)   Social Connection and Isolation Panel [NHANES]    Frequency of Communication with Friends and Family: More than three times a week    Frequency of Social Gatherings with Friends and Family: More than three times a week    Attends Religious Services: Never    Database administrator or Organizations: No    Attends Engineer, structural: Never    Marital Status: Divorced    Tobacco Counseling Counseling given: Not Answered    Clinical Intake:  Pre-visit preparation completed: Yes  Pain : 0-10 Pain Score: 6  (standing) Pain Type: Chronic pain Pain Location: Knee Pain Orientation: Left, Right Pain Descriptors / Indicators: Constant     BMI - recorded: 44.8 Nutritional Status: BMI > 30  Obese Nutritional Risks: None Diabetes: No  Lab Results  Component Value Date   HGBA1C 5.1 09/21/2022   HGBA1C 4.8 05/12/2021   HGBA1C 5.4 07/06/2016     How often do you need to have someone help you when you read instructions, pamphlets, or other written materials from your doctor or pharmacy?: 1 - Never  Interpreter Needed?: No  Information entered by :: Druscilla Gerhard, LPN.   Activities of Daily Living     11/24/2023   12:17 PM 03/22/2023   10:53 AM  In your present state of health, do you have any difficulty performing the following activities:  Hearing? 0 0  Vision? 0 0  Difficulty concentrating or making decisions? 0 0  Comment BSE: reading, cross word puzzles, memory  games on You-Tube   Walking or climbing stairs? 0 0  Dressing or bathing? 0 0  Doing  errands, shopping? 0 0  Preparing Food and eating ? N   Using the Toilet? N   In the past six months, have you accidently leaked urine? N   Do you have problems with loss of bowel control? N   Managing your Medications? N   Managing your Finances? N   Housekeeping or managing your Housekeeping? N     Patient Care Team: Driscilla George, MD as PCP - General (Internal Medicine) Adalberto Hollow, MD as Consulting Physician (General Surgery) [facility] as Consulting Physician (Ophthalmology)  Indicate any recent Medical Services you may have received from other than [facility] providers in the past year (date may be approximate).     Assessment:   This is a routine wellness examination for Tina Chase.  Hearing/Vision screen Hearing Screening - Comments:: Denies hearing difficulties.  Vision Screening - Comments:: Wears reading glasses - up to date with routine eye exams with [facility]    Goals Addressed             This Visit's Progress    Patient Stated       11/24/2023: To be able to walk again without having pain in my knees.       Depression Screen     11/24/2023   12:18 PM 09/21/2022    1:42 PM 09/21/2022    1:41 PM 09/21/2022   11:38 AM 09/21/2022   11:33 AM 06/15/2022   10:10 AM 12/01/2021   11:14 AM  PHQ 2/9 Scores  PHQ - 2 Score 0 0 0 0 0 0 0  PHQ- 9 Score 0          Fall Risk     11/24/2023   12:17 PM 03/22/2023   10:51 AM 09/21/2022    1:42 PM 09/21/2022   11:32 AM 06/15/2022    9:37 AM  Fall Risk   Falls in the past year? 0 0 0 0 0  Number falls in past yr: 0 0 0 0 0  Injury with Fall? 0 0 0 0 0  Risk for fall due to : No Fall Risks  No Fall Risks    Follow up Falls prevention discussed;Falls evaluation completed Falls evaluation completed Falls evaluation completed Falls evaluation completed Falls evaluation completed    MEDICARE RISK AT HOME:  Medicare Risk at Home Any stairs in or around the home?: Yes (lives in senior center with  elevators) If so, are there any without handrails?: No Home free of loose throw rugs in walkways, pet beds, electrical cords, etc?: Yes Adequate lighting in your home to reduce risk of falls?: Yes Life alert?: Yes Use of a cane, walker or w/c?: No Grab bars in the bathroom?: Yes Shower chair or bench in shower?: Yes Elevated toilet seat or a handicapped toilet?: No  TIMED UP AND GO:  Was the test performed?  No  Cognitive Function: 6CIT completed    11/24/2023   12:19 PM  MMSE - Mini Mental State Exam  Not completed: Unable to complete        11/24/2023   12:15 PM 09/21/2022    1:42 PM  6CIT Screen  What Year? 0 points 0 points  What month? 0 points 0 points  What time? 0 points 0 points  Count back from 20 0 points 0 points  Months in reverse 0 points 0 points  Repeat  phrase 0 points 0 points  Total Score 0 points 0 points    Immunizations Immunization History  Administered Date(s) Administered   Fluad Quad(high Dose 65+) 05/12/2021   Influenza Split 08/21/2011   Influenza Whole 06/02/2010   Influenza, Seasonal, Injecte, Preservative Fre 07/21/2012   Influenza,inj,Quad PF,6+ Mos 04/10/2014, 07/08/2015, 03/30/2016, 05/10/2017, 05/23/2018, 05/15/2019, 05/06/2020   Influenza-Unspecified 05/12/2022   PFIZER(Purple Top)SARS-COV-2 Vaccination 11/10/2019, 12/01/2019, 08/10/2020   Pfizer(Comirnaty)Fall Seasonal Vaccine 12 years and older 05/12/2022   Pneumococcal Conjugate-13 07/08/2015   Pneumococcal Polysaccharide-23 07/01/2009   Td 09/23/2009    Screening Tests Health Maintenance  Topic Date Due   Zoster Vaccines- Shingrix (1 of 2) Never done   DTaP/Tdap/Td (2 - Tdap) 09/24/2019   COVID-19 Vaccine (5 - 2024-25 season) 04/04/2023   INFLUENZA VACCINE  03/03/2024   Medicare Annual Wellness (AWV)  11/23/2024   Pneumonia Vaccine 20+ Years old  Completed   DEXA SCAN  Completed   HPV VACCINES  Aged Out   Meningococcal B Vaccine  Aged Out    Health  Maintenance  Health Maintenance Due  Topic Date Due   Zoster Vaccines- Shingrix (1 of 2) Never done   DTaP/Tdap/Td (2 - Tdap) 09/24/2019   COVID-19 Vaccine (5 - 2024-25 season) 04/04/2023   Health Maintenance Items Addressed: Yes, Patient is due for Covid-19, Tdap and Shingrix vaccines.  Additional Screening:  Vision Screening: Recommended annual ophthalmology exams for early detection of glaucoma and other disorders of the eye.  Dental Screening: Recommended annual dental exams for proper oral hygiene  Community Resource Referral / Chronic Care Management: CRR required this visit?  No   CCM required this visit?  No     Plan:     I have personally reviewed and noted the following in the patient's chart:   Medical and social history Use of alcohol, tobacco or illicit drugs  Current medications and supplements including opioid prescriptions. Patient is not currently taking opioid prescriptions. Functional ability and status Nutritional status Physical activity Advanced directives List of other physicians Hospitalizations, surgeries, and ER visits in previous 12 months Vitals Screenings to include cognitive, depression, and falls Referrals and appointments  In addition, I have reviewed and discussed with patient certain preventive protocols, quality metrics, and best practice recommendations. A written personalized care plan for preventive services as well as general preventive health recommendations were provided to patient.     Margette Sheldon, LPN   5/36/6440   After Visit Summary: (MyChart) Due to this being a telephonic visit, the after visit summary with patients personalized plan was offered to patient via MyChart   Notes: Nothing significant to report at this time.

## 2023-11-24 NOTE — Patient Instructions (Addendum)
 Ms. Molitor , Thank you for taking time to come for your Medicare Wellness Visit. I appreciate your ongoing commitment to your health goals. Please review the following plan we discussed and let me know if I can assist you in the future.   Referrals/Orders/Follow-Ups/Clinician Recommendations: Yes, keep maintaining your health by keeping your appointments with Dr.  Driscilla George and any specialists that you may see.  Call us  if you need anything.  Have a great year!!!!  This is a list of the screening recommended for you and due dates:  Health Maintenance  Topic Date Due   Zoster (Shingles) Vaccine (1 of 2) Never done   DTaP/Tdap/Td vaccine (2 - Tdap) 09/24/2019   COVID-19 Vaccine (5 - 2024-25 season) 04/04/2023   Flu Shot  03/03/2024   Medicare Annual Wellness Visit  11/23/2024   Pneumonia Vaccine  Completed   DEXA scan (bone density measurement)  Completed   HPV Vaccine  Aged Out   Meningitis B Vaccine  Aged Out    Advanced directives: (Declined) Advance directive discussed with you today. Even though you declined this today, please call our office should you change your mind, and we can give you the proper paperwork for you to fill out.  Next Medicare Annual Wellness Visit scheduled for next year: Yes It was nice speaking with you today! Your next Annual Wellness Visit is scheduled for 11/29/2024 at 11:50 a.m. via PHONE VISIT. If you need to reschedule or cancel, please call [phone]. (IMP)

## 2023-12-07 NOTE — Progress Notes (Unsigned)
 [facility]: Clinic Note  Subjective:  History of Present Illness: Tina Chase is a 83 y.o. year old female who presents for routine follow up for her chronic medical conditions. She was Dr. Tana Falls patient, this is our first time meeting.  She is doing well overall. Her main concern today is worsening lymphedema of her bilateral legs. This is a chronic problem, but it has been bothering her more over the past 6 months. She has been taking lasix  and trying to elevate her legs when possible, but has not noticed much improvement. She's very interested in seeing an OT specialist to help manage this.  We reviewed her problem list and medication list together. She is taking everything as prescribed including Xarelto . She has not noticed any bleeding.  She observes that her potassium citrate  pills are undigested in her stool, so is wondering if there is an alternative she can take for this medicine.  She also observes increased fatigue over the past few months. Difficult to quantify, but it feels similar to when iron  was low in the past. No bleeding, chest pain, or dyspnea. She is in good spirits.    Please refer to Assessment and Plan below for full details in Problem-Based Charting.   Past Medical History:  Patient Active Problem List   Diagnosis Date Noted   Morbid (severe) obesity due to excess calories (HCC) 12/08/2023   Stage 3b chronic kidney disease (CKD) (HCC) 12/08/2023   Chronic kidney disease, stage 3a (HCC) 03/09/2022   Iron  deficiency anemia    Hypercoagulable state (HCC)    Sleep apnea 07/08/2015   Lymphedema associated with obesity 01/09/2014   Hyperuricemia 05/25/2013   Nephrolithiasis 05/25/2013   Hypertension 12/22/2011   Obesity, Class III, BMI 40-49.9 (morbid obesity) 12/22/2011   Asthma 12/04/2011   Single kidney 09/15/2011   Routine health maintenance 09/15/2011      Medications:  Current Outpatient Medications:    acetaminophen   (TYLENOL ) 325 MG tablet, Take 975 mg by mouth every 6 (six) hours as needed for moderate pain or headache., Disp: , Rfl:    albuterol  (PROVENTIL ) (2.5 MG/3ML) 0.083% nebulizer solution, Take 3 mLs (2.5 mg total) by nebulization every 6 (six) hours as needed for wheezing or shortness of breath., Disp: 75 mL, Rfl: 12   albuterol  (VENTOLIN  HFA) 108 (90 Base) MCG/ACT inhaler, INHALE 2 PUFFS INTO THE LUNGS EVERY 4 HOURS AS NEEDED FOR WHEEZING OR SHORTNESS OF BREATH, Disp: 6.7 g, Rfl: 1   allopurinol  (ZYLOPRIM ) 300 MG tablet, TAKE ONE TABLET BY MOUTH AT BEDTIME, Disp: 90 tablet, Rfl: 3   amLODipine  (NORVASC ) 10 MG tablet, TAKE ONE TABLET BY MOUTH EVERY DAY, Disp: 60 tablet, Rfl: 0   carvedilol  (COREG ) 25 MG tablet, TAKE ONE TABLET BY MOUTH TWICE DAILY, Disp: 180 tablet, Rfl: 1   fluticasone  (FLOVENT  HFA) 44 MCG/ACT inhaler, Inhale 2 puffs into the lungs daily., Disp: 21.2 g, Rfl: 1   furosemide  (LASIX ) 40 MG tablet, Take 40 mg by mouth daily., Disp: , Rfl:    Potassium Citrate  15 MEQ (1620 MG) TBCR, TAKE ONE TABLET BY MOUTH EVERY DAY, Disp: 90 tablet, Rfl: 0   XARELTO  10 MG TABS tablet, TAKE ONE TABLET BY MOUTH EVERY DAY AT 10PM, Disp: 90 tablet, Rfl: 3   Allergies: Allergies  Allergen Reactions   Hydralazine Anaphylaxis and Other (See Comments)    Swelling tongue.   Lisinopril Swelling and Other (See Comments)    REACTION: angioedema Tongue swelling   Aspirin  Other (See Comments)    REACTION: hives   Crab [Shellfish Allergy] Hives and Other (See Comments)    Can eat shrimp and fish and lobster       Objective:   Vitals: Vitals:   12/08/23 1026  BP: 138/63  Pulse: 69  Temp: 98 F (36.7 C)  SpO2: 99%     Physical Exam: Physical Exam Constitutional:      Appearance: Normal appearance.  Cardiovascular:     Rate and Rhythm: Normal rate and regular rhythm.  Pulmonary:     Effort: Pulmonary effort is normal.     Breath sounds: Normal breath sounds.  Musculoskeletal:     Right  lower leg: Edema present.     Left lower leg: Edema present.  Skin:    General: Skin is warm and dry.  Neurological:     Mental Status: She is alert.      Data: Labs, imaging, and micro were reviewed in Epic. Refer to Assessment and Plan below for full details in Problem-Based Charting.  Assessment & Plan:  Hypertension - chronic and stable - Blood pressure of 138/63 is acceptable for me - I would like to keep her under SBP 140 if her DBP allows for this - Continue amlodipine  10mg  daily & carvedilol  25mg  BID today  Hyperuricemia History of significant nephrolithiasis resulting in single kidney state. She uses allopurinol  for uric acid lowering and potassium citrate  to lower risk of future nephrolithiasis. Will check uric acid and CBC today. Continue with allopurinol  3 mg daily. Goal uric acid less than 5.   Lymphedema associated with obesity - This is her major concern today - Continue lasix  40mg  daily with BMP today to ensure kidney function is stable - I repeated referral to OT lymphedema management services today   Hypercoagulable state (HCC) - chronic and stable - Given her unprovoked large PE in 11/2015, as long as she is doing well on xarelto , plan to continue indefinitely - continue xarelto  10mg  daily   Iron  deficiency anemia - check iron  studies & CBC today      Patient will follow up in 6 months   Driscilla George, MD

## 2023-12-08 ENCOUNTER — Ambulatory Visit (INDEPENDENT_AMBULATORY_CARE_PROVIDER_SITE_OTHER): Admitting: Internal Medicine

## 2023-12-08 ENCOUNTER — Encounter: Payer: Self-pay | Admitting: Internal Medicine

## 2023-12-08 VITALS — BP 138/63 | HR 69 | Temp 98.0°F | Ht 64.0 in | Wt 264.0 lb

## 2023-12-08 DIAGNOSIS — D509 Iron deficiency anemia, unspecified: Secondary | ICD-10-CM | POA: Diagnosis not present

## 2023-12-08 DIAGNOSIS — I890 Lymphedema, not elsewhere classified: Secondary | ICD-10-CM

## 2023-12-08 DIAGNOSIS — E790 Hyperuricemia without signs of inflammatory arthritis and tophaceous disease: Secondary | ICD-10-CM

## 2023-12-08 DIAGNOSIS — I10 Essential (primary) hypertension: Secondary | ICD-10-CM

## 2023-12-08 DIAGNOSIS — D6859 Other primary thrombophilia: Secondary | ICD-10-CM | POA: Diagnosis not present

## 2023-12-08 DIAGNOSIS — Z905 Acquired absence of kidney: Secondary | ICD-10-CM

## 2023-12-08 DIAGNOSIS — N200 Calculus of kidney: Secondary | ICD-10-CM | POA: Diagnosis not present

## 2023-12-08 DIAGNOSIS — Z131 Encounter for screening for diabetes mellitus: Secondary | ICD-10-CM | POA: Diagnosis not present

## 2023-12-08 DIAGNOSIS — N1832 Chronic kidney disease, stage 3b: Secondary | ICD-10-CM | POA: Insufficient documentation

## 2023-12-08 DIAGNOSIS — N1831 Chronic kidney disease, stage 3a: Secondary | ICD-10-CM

## 2023-12-08 DIAGNOSIS — E669 Obesity, unspecified: Secondary | ICD-10-CM | POA: Diagnosis not present

## 2023-12-08 NOTE — Assessment & Plan Note (Signed)
-   check iron  studies & CBC today

## 2023-12-08 NOTE — Assessment & Plan Note (Signed)
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  She uses allopurinol for uric acid lowering and potassium citrate to lower risk of future nephrolithiasis.  Will check uric acid and CBC today.  Continue with allopurinol 3 mg daily.  Goal uric acid less than 5.

## 2023-12-08 NOTE — Patient Instructions (Signed)
 Thank you, Tina Chase for allowing us  to provide your care today. Today we discussed your lymphedema, your blood pressure, your kidney, and your anemia.    I have ordered the following labs for you:   Lab Orders         BMP8+Anion Gap         Iron , TIBC and Ferritin Panel         CBC no Diff         Hemoglobin A1c         Uric acid        Referrals ordered today:    Referral Orders         Ambulatory referral to Occupational Therapy     This is the lymphedema clinic  I have ordered the following medication/changed the following medications:    Start the following medications: No orders of the defined types were placed in this encounter.    Return in about 6 months (around 06/09/2024) for Chronic medical conditions.    Remember:  - If you haven't heard from the lymphedema clinic in the next 2 weeks, please call our clinic to check on the status of the referral - I will work on finding an alternative medicine for your potassium citrate . In the meantime, keep taking the pills   Should you have any questions or concerns please call the internal medicine clinic at [phone].     Diera Wirkkala, MD Faculty, Internal Medicine Teaching [facility]

## 2023-12-08 NOTE — Assessment & Plan Note (Signed)
-   chronic and stable - Blood pressure of 138/63 is acceptable for me - I would like to keep her under SBP 140 if her DBP allows for this - Continue amlodipine  10mg  daily & carvedilol  25mg  BID today

## 2023-12-08 NOTE — Assessment & Plan Note (Signed)
-   This is her major concern today - Continue lasix  40mg  daily with BMP today to ensure kidney function is stable - I repeated referral to OT lymphedema management services today

## 2023-12-08 NOTE — Assessment & Plan Note (Signed)
-   chronic and stable - Given her unprovoked large PE in 11/2015, as long as she is doing well on xarelto , plan to continue indefinitely - continue xarelto  10mg  daily

## 2023-12-09 ENCOUNTER — Other Ambulatory Visit: Payer: Self-pay | Admitting: Internal Medicine

## 2023-12-09 DIAGNOSIS — D509 Iron deficiency anemia, unspecified: Secondary | ICD-10-CM

## 2023-12-09 LAB — CBC
Hematocrit: 37.6 % (ref 34.0–46.6)
Hemoglobin: 12 g/dL (ref 11.1–15.9)
MCH: 31.1 pg (ref 26.6–33.0)
MCHC: 31.9 g/dL (ref 31.5–35.7)
MCV: 97 fL (ref 79–97)
Platelets: 228 10*3/uL (ref 150–450)
RBC: 3.86 x10E6/uL (ref 3.77–5.28)
RDW: 13.1 % (ref 11.7–15.4)
WBC: 6.9 10*3/uL (ref 3.4–10.8)

## 2023-12-09 LAB — BMP8+ANION GAP
Anion Gap: 11 mmol/L (ref 10.0–18.0)
BUN/Creatinine Ratio: 22 (ref 12–28)
BUN: 23 mg/dL (ref 8–27)
CO2: 27 mmol/L (ref 20–29)
Calcium: 10.4 mg/dL — ABNORMAL HIGH (ref 8.7–10.3)
Chloride: 101 mmol/L (ref 96–106)
Creatinine, Ser: 1.03 mg/dL — ABNORMAL HIGH (ref 0.57–1.00)
Glucose: 91 mg/dL (ref 70–99)
Potassium: 4.4 mmol/L (ref 3.5–5.2)
Sodium: 139 mmol/L (ref 134–144)
eGFR: 55 mL/min/{1.73_m2} — ABNORMAL LOW (ref >59)

## 2023-12-09 LAB — IRON,TIBC AND FERRITIN PANEL
Ferritin: 38 ng/mL (ref 15–150)
Iron Saturation: 17 % (ref 15–55)
Iron: 57 ug/dL (ref 27–139)
Total Iron Binding Capacity: 341 ug/dL (ref 250–450)
UIBC: 284 ug/dL (ref 118–369)

## 2023-12-09 LAB — HEMOGLOBIN A1C
Est. average glucose Bld gHb Est-mCnc: 100 mg/dL
Hgb A1c MFr Bld: 5.1 % (ref 4.8–5.6)

## 2023-12-09 LAB — URIC ACID: Uric Acid: 3.2 mg/dL (ref 3.1–7.9)

## 2023-12-09 MED ORDER — FERROUS SULFATE 325 (65 FE) MG PO TBEC: 325.0000 mg | DELAYED_RELEASE_TABLET | Freq: Every day | ORAL | Status: AC

## 2023-12-09 NOTE — Progress Notes (Signed)
 See result note.

## 2023-12-21 ENCOUNTER — Other Ambulatory Visit: Payer: Self-pay | Admitting: Student in an Organized Health Care Education/Training Program

## 2023-12-21 DIAGNOSIS — E790 Hyperuricemia without signs of inflammatory arthritis and tophaceous disease: Secondary | ICD-10-CM

## 2024-01-05 ENCOUNTER — Telehealth: Payer: Self-pay | Admitting: *Deleted

## 2024-01-05 NOTE — Telephone Encounter (Signed)
 Copied from CRM [id number]. Topic: General - Other >> Jan 05, 2024 12:12 PM Adrianna P wrote: Reason for CRM: Patient needs Dr. Jarvis Mesa to schedule her at [place] not , for lymphedema

## 2024-01-11 ENCOUNTER — Other Ambulatory Visit (INDEPENDENT_AMBULATORY_CARE_PROVIDER_SITE_OTHER)

## 2024-01-11 DIAGNOSIS — D509 Iron deficiency anemia, unspecified: Secondary | ICD-10-CM

## 2024-01-12 LAB — IRON,TIBC AND FERRITIN PANEL
Ferritin: 77 ng/mL (ref 15–150)
Iron Saturation: 24 % (ref 15–55)
Iron: 79 ug/dL (ref 27–139)
Total Iron Binding Capacity: 327 ug/dL (ref 250–450)
UIBC: 248 ug/dL (ref 118–369)

## 2024-02-14 ENCOUNTER — Encounter: Payer: Self-pay | Admitting: *Deleted

## 2024-02-15 ENCOUNTER — Other Ambulatory Visit: Payer: Self-pay | Admitting: Student in an Organized Health Care Education/Training Program

## 2024-02-25 ENCOUNTER — Other Ambulatory Visit: Payer: Self-pay | Admitting: Student in an Organized Health Care Education/Training Program

## 2024-04-10 NOTE — Progress Notes (Incomplete)
 Subjective:   Patient ID: ENNIFER HARSTON female   DOB: 06/27/1941 83 y.o.   MRN: [id number]  HPI: Ms.Elesia CRISTALLE ROHM is a 83 y.o.     Past Medical History:  Diagnosis Date   Arthritis    left knee  and left shoulder    Asthma    Chronic kidney disease    right non functioning kidney    Diverticulosis    Hyperlipidemia    Hyperparathyroidism    Hypertension    IDA (iron  deficiency anemia)    Kidney atrophy    Myocardial infarction [facility])    patient denies   Obesity    Pica in adults    Pulmonary embolism (HCC)    Provoked 2013, Unprovoked 2017   Pulmonary nodule    12mm, stable 2013 to 2017, benign   Sleep apnea    CPAP   Small bowel obstruction (HCC)    Current Outpatient Medications  Medication Sig Dispense Refill   acetaminophen  (TYLENOL ) 325 MG tablet Take 975 mg by mouth every 6 (six) hours as needed for moderate pain or headache.     albuterol  (PROVENTIL ) (2.5 MG/3ML) 0.083% nebulizer solution Take 3 mLs (2.5 mg total) by nebulization every 6 (six) hours as needed for wheezing or shortness of breath. 75 mL 12   albuterol  (VENTOLIN  HFA) 108 (90 Base) MCG/ACT inhaler INHALE 2 PUFFS INTO THE LUNGS EVERY 4 HOURS AS NEEDED FOR WHEEZING OR SHORTNESS OF BREATH 6.7 g 1   allopurinol  (ZYLOPRIM ) 300 MG tablet TAKE ONE TABLET BY MOUTH AT BEDTIME 90 tablet 3   amLODipine  (NORVASC ) 10 MG tablet TAKE ONE TABLET BY MOUTH EVERY DAY 60 tablet 0   carvedilol  (COREG ) 25 MG tablet TAKE ONE TABLET BY MOUTH TWICE DAILY 180 tablet 1   ferrous sulfate  325 (65 FE) MG EC tablet Take 1 tablet (325 mg total) by mouth daily with breakfast.     fluticasone  (FLOVENT  HFA) 44 MCG/ACT inhaler Inhale 2 puffs into the lungs daily. 21.2 g 1   furosemide  (LASIX ) 40 MG tablet Take 1 tablet (40 mg total) by mouth daily. 90 tablet 3   Potassium Citrate  15 MEQ (1620 MG) TBCR TAKE ONE TABLET BY MOUTH EVERY DAY 90 tablet 0   XARELTO  10 MG TABS tablet TAKE ONE TABLET BY MOUTH EVERY DAY AT 10PM 90 tablet 3   No  current facility-administered medications for this visit.   Family History  Problem Relation Age of Onset   Hyperlipidemia Mother    Hypertension Mother    Heart disease Father        had a heart attack at age 64   Heart attack Father 48   Heart disease Brother        at age 61   Heart attack Brother 10   Diabetes Brother    Kidney disease Brother    Hypertension Brother    Hypertension Sister    Diabetes Sister    Hypertension Maternal Grandmother    Arthritis Maternal Grandmother    Heart disease Paternal Grandmother    Hypertension Paternal Grandmother    Kidney disease Brother    HIV/AIDS Brother    Hypertension Brother    Lung cancer Brother    Hypertension Sister    Hypertension Sister    Emphysema Sister    Hypertension Sister    Asthma Son    Cataracts Daughter    Hypertension Daughter    Stroke Neg Hx    Cancer Neg Hx  Nephrolithiasis Neg Hx    Breast cancer Neg Hx    Social History   Socioeconomic History   Marital status: Single    Spouse name: Not on file   Number of children: 5   Years of education: Not on file   Highest education level: Not on file  Occupational History   Occupation: retired    Comment: CNA  Tobacco Use   Smoking status: Former    Current packs/day: 0.00    Average packs/day: 0.3 packs/day for 3.0 years (0.8 ttl pk-yrs)    Types: Cigarettes    Start date: 08/03/1965    Quit date: 08/03/1968    Years since quitting: 55.7   Smokeless tobacco: Never  Vaping Use   Vaping status: Never Used  Substance and Sexual Activity   Alcohol use: No    Alcohol/week: 0.0 standard drinks of alcohol   Drug use: No   Sexual activity: Not Currently  Other Topics Concern   Not on file  Social History Narrative   Not on file   Social Drivers of Health   Financial Resource Strain: Low Risk  (11/24/2023)   Overall Financial Resource Strain (CARDIA)    Difficulty of Paying Living Expenses: Not hard at all  Food Insecurity: No Food Insecurity  (11/24/2023)   Hunger Vital Sign    Worried About Running Out of Food in the Last Year: Never true    Ran Out of Food in the Last Year: Never true  Transportation Needs: No Transportation Needs (11/24/2023)   PRAPARE - Administrator, Civil Service (Medical): No    Lack of Transportation (Non-Medical): No  Physical Activity: Inactive (11/24/2023)   Exercise Vital Sign    Days of Exercise per Week: 0 days    Minutes of Exercise per Session: 0 min  Stress: No Stress Concern Present (11/24/2023)   [employer] of Occupational Health - Occupational Stress Questionnaire    Feeling of Stress : Not at all  Social Connections: Socially Isolated (11/24/2023)   Social Connection and Isolation Panel    Frequency of Communication with Friends and Family: More than three times a week    Frequency of Social Gatherings with Friends and Family: More than three times a week    Attends Religious Services: Never    Database administrator or Organizations: No    Attends Banker Meetings: Never    Marital Status: Divorced   Review of Systems: {Review Of Systems:30496} Objective:   There were no vitals filed for this visit.  Physical Exam: GEN: Well appearing, NAD. Oriented x 3, normal mood and affect  HEENT: Normocephalic, atraumatic, Conjunctiva clear, sclera non-icteric, EOM intact, no cervical LAD CV: RRR, no M/R/G PULM: CTAB GI: Bowel sounds normal, no TTP in all 4 quadrants MSK: b/l LE wnl NEURO: CN II-XII grossly intact, moving all extremities spontaneously in upper and lower extremities. PSYCH: Oriented X3, intact recent and remote memory, judgment and insight, normal mood and affect.   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

## 2024-04-12 ENCOUNTER — Ambulatory Visit: Payer: Self-pay

## 2024-04-26 ENCOUNTER — Ambulatory Visit: Payer: Self-pay

## 2024-05-01 ENCOUNTER — Other Ambulatory Visit: Payer: Self-pay

## 2024-05-01 MED ORDER — AMLODIPINE BESYLATE 10 MG PO TABS: 10.0000 mg | ORAL_TABLET | Freq: Every day | ORAL | 0 refills | Status: DC

## 2024-05-10 ENCOUNTER — Other Ambulatory Visit: Payer: Self-pay

## 2024-05-10 ENCOUNTER — Other Ambulatory Visit (HOSPITAL_COMMUNITY): Payer: Self-pay

## 2024-05-10 ENCOUNTER — Ambulatory Visit

## 2024-05-10 VITALS — BP 130/55 | HR 65 | Temp 98.2°F | Ht 63.0 in | Wt 253.6 lb

## 2024-05-10 DIAGNOSIS — I5032 Chronic diastolic (congestive) heart failure: Secondary | ICD-10-CM | POA: Diagnosis not present

## 2024-05-10 DIAGNOSIS — Z23 Encounter for immunization: Secondary | ICD-10-CM | POA: Diagnosis not present

## 2024-05-10 DIAGNOSIS — R61 Generalized hyperhidrosis: Secondary | ICD-10-CM | POA: Diagnosis not present

## 2024-05-10 DIAGNOSIS — E66813 Obesity, class 3: Secondary | ICD-10-CM

## 2024-05-10 DIAGNOSIS — Z87891 Personal history of nicotine dependence: Secondary | ICD-10-CM

## 2024-05-10 DIAGNOSIS — Z8419 Family history of other disorders of kidney and ureter: Secondary | ICD-10-CM

## 2024-05-10 DIAGNOSIS — Z6841 Body Mass Index (BMI) 40.0 and over, adult: Secondary | ICD-10-CM

## 2024-05-10 DIAGNOSIS — I5030 Unspecified diastolic (congestive) heart failure: Secondary | ICD-10-CM | POA: Insufficient documentation

## 2024-05-10 DIAGNOSIS — M7989 Other specified soft tissue disorders: Secondary | ICD-10-CM

## 2024-05-10 DIAGNOSIS — I10 Essential (primary) hypertension: Secondary | ICD-10-CM

## 2024-05-10 DIAGNOSIS — I129 Hypertensive chronic kidney disease with stage 1 through stage 4 chronic kidney disease, or unspecified chronic kidney disease: Secondary | ICD-10-CM

## 2024-05-10 DIAGNOSIS — N1831 Chronic kidney disease, stage 3a: Secondary | ICD-10-CM | POA: Diagnosis not present

## 2024-05-10 DIAGNOSIS — Z79899 Other long term (current) drug therapy: Secondary | ICD-10-CM

## 2024-05-10 DIAGNOSIS — Z8249 Family history of ischemic heart disease and other diseases of the circulatory system: Secondary | ICD-10-CM

## 2024-05-10 DIAGNOSIS — Z833 Family history of diabetes mellitus: Secondary | ICD-10-CM

## 2024-05-10 MED ORDER — ZOSTER VAC RECOMB ADJUVANTED 50 MCG/0.5ML IM SUSR
0.5000 mL | Freq: Once | INTRAMUSCULAR | 0 refills | Status: AC
  Filled 2024-05-10: qty 0.5, 1d supply, fill #0

## 2024-05-10 MED ORDER — IRBESARTAN 75 MG PO TABS
75.0000 mg | ORAL_TABLET | Freq: Every day | ORAL | 11 refills | Status: DC
  Filled 2024-05-10 (×2): qty 30, 30d supply, fill #0

## 2024-05-10 MED ORDER — EMPAGLIFLOZIN 10 MG PO TABS
10.0000 mg | ORAL_TABLET | Freq: Every day | ORAL | 3 refills | Status: DC
  Filled 2024-05-10: qty 30, 30d supply, fill #0

## 2024-05-10 MED ORDER — FAMOTIDINE 20 MG PO TABS
20.0000 mg | ORAL_TABLET | Freq: Two times a day (BID) | ORAL | 1 refills | Status: DC
  Filled 2024-05-10: qty 60, 30d supply, fill #0

## 2024-05-10 NOTE — Assessment & Plan Note (Signed)
 Has had some weight loss with diet and exercise. Unfortunately her functional/exercise capacity is hindered by her LE swelling. Will aim to get this under control. I do think she would benefit from an OSA evaluation for possible GLP-1 agonist as she does not have T2DM. Will discuss at next visit.

## 2024-05-10 NOTE — Patient Instructions (Signed)
 Thank you for coming in today. If you have any questions or concerns, please feel free to contact me via MyChart or call the office.   If you have gotten labs today, I will be in touch via MyChart or telephone.   Have a great day.

## 2024-05-10 NOTE — Progress Notes (Signed)
 Subjective:   Patient ID: Tina Chase female   DOB: 21-Oct-1940 83 y.o.   MRN: [id number]  HPI: [name] is a 83 y.o. 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  Night sweats - pt notes increasing frequency of night sweats which is new as of ~ 2-3 years ago. Previously happened only rarely, but more frequently now (~3x/week).  Did not have night sweats during the onset of menopause. Wakes up sweating, changes clothes and is able to go back to sleep. She reports a ~10# intentional weight loss with diet since her last visit in May. She notes she has to clear her throat often because of mucus but denies any significant cough. She also notes that she feels cold often. No other symptoms including fevers/chills, abnormal lymph nodes or lumps that she has noticed, dark/bloody stools, changes in bowel habits.  LE edema - she notes significant LE edema despite adherence to Lasix . This leg swelling has been very bothersome to her and significantly affects her functional capacity & her ability to get around/perform ADLs. She heard from a friend that amlodipine  can cause leg swelling and she would like for this medication to be changed.    Past Medical History:  Diagnosis Date   Arthritis    left knee  and left shoulder    Asthma    Chronic kidney disease    right non functioning kidney    Diverticulosis    Hyperlipidemia    Hyperparathyroidism    Hypertension    IDA (iron  deficiency anemia)    Kidney atrophy    Myocardial infarction [facility])    patient denies   Obesity    Pica in adults    Pulmonary embolism (HCC)    Provoked 2013, Unprovoked 2017   Pulmonary nodule    12mm, stable 2013 to 2017, benign   Sleep apnea    CPAP   Small bowel obstruction (HCC)    Current Outpatient Medications  Medication Sig Dispense Refill   acetaminophen  (TYLENOL ) 325 MG tablet Take 975 mg by mouth every 6 (six) hours as needed for  moderate pain or headache.     albuterol  (PROVENTIL ) (2.5 MG/3ML) 0.083% nebulizer solution Take 3 mLs (2.5 mg total) by nebulization every 6 (six) hours as needed for wheezing or shortness of breath. 75 mL 12   albuterol  (VENTOLIN  HFA) 108 (90 Base) MCG/ACT inhaler INHALE 2 PUFFS INTO THE LUNGS EVERY 4 HOURS AS NEEDED FOR WHEEZING OR SHORTNESS OF BREATH 6.7 g 1   allopurinol  (ZYLOPRIM ) 300 MG tablet TAKE ONE TABLET BY MOUTH AT BEDTIME 90 tablet 3   amLODipine  (NORVASC ) 10 MG tablet Take 1 tablet (10 mg total) by mouth daily. 60 tablet 0   carvedilol  (COREG ) 25 MG tablet TAKE ONE TABLET BY MOUTH TWICE DAILY 180 tablet 1   ferrous sulfate  325 (65 FE) MG EC tablet Take 1 tablet (325 mg total) by mouth daily with breakfast.     fluticasone  (FLOVENT  HFA) 44 MCG/ACT inhaler Inhale 2 puffs into the lungs daily. 21.2 g 1   furosemide  (LASIX ) 40 MG tablet Take 1 tablet (40 mg total) by mouth daily. 90 tablet 3   Potassium Citrate  15 MEQ (1620 MG) TBCR TAKE ONE TABLET BY MOUTH EVERY DAY 90 tablet 0   XARELTO  10 MG TABS tablet TAKE ONE TABLET BY MOUTH EVERY DAY AT 10PM 90 tablet 3   No current facility-administered medications for this visit.  Family History  Problem Relation Age of Onset   Hyperlipidemia Mother    Hypertension Mother    Heart disease Father        had a heart attack at age 70   Heart attack Father 25   Heart disease Brother        at age 70   Heart attack Brother 90   Diabetes Brother    Kidney disease Brother    Hypertension Brother    Hypertension Sister    Diabetes Sister    Hypertension Maternal Grandmother    Arthritis Maternal Grandmother    Heart disease Paternal Grandmother    Hypertension Paternal Grandmother    Kidney disease Brother    HIV/AIDS Brother    Hypertension Brother    Lung cancer Brother    Hypertension Sister    Hypertension Sister    Emphysema Sister    Hypertension Sister    Asthma Son    Cataracts Daughter    Hypertension Daughter     Stroke Neg Hx    Cancer Neg Hx    Nephrolithiasis Neg Hx    Breast cancer Neg Hx    Social History   Socioeconomic History   Marital status: Single    Spouse name: Not on file   Number of children: 5   Years of education: Not on file   Highest education level: Not on file  Occupational History   Occupation: retired    Comment: CNA  Tobacco Use   Smoking status: Former    Current packs/day: 0.00    Average packs/day: 0.3 packs/day for 3.0 years (0.8 ttl pk-yrs)    Types: Cigarettes    Start date: 08/03/1965    Quit date: 08/03/1968    Years since quitting: 55.8   Smokeless tobacco: Never  Vaping Use   Vaping status: Never Used  Substance and Sexual Activity   Alcohol use: No    Alcohol/week: 0.0 standard drinks of alcohol   Drug use: No   Sexual activity: Not Currently  Other Topics Concern   Not on file  Social History Narrative   Not on file   Social Drivers of Health   Financial Resource Strain: Low Risk  (11/24/2023)   Overall Financial Resource Strain (CARDIA)    Difficulty of Paying Living Expenses: Not hard at all  Food Insecurity: No Food Insecurity (11/24/2023)   Hunger Vital Sign    Worried About Running Out of Food in the Last Year: Never true    Ran Out of Food in the Last Year: Never true  Transportation Needs: No Transportation Needs (11/24/2023)   PRAPARE - Administrator, Civil Service (Medical): No    Lack of Transportation (Non-Medical): No  Physical Activity: Inactive (11/24/2023)   Exercise Vital Sign    Days of Exercise per Week: 0 days    Minutes of Exercise per Session: 0 min  Stress: No Stress Concern Present (11/24/2023)   [employer] of Occupational Health - Occupational Stress Questionnaire    Feeling of Stress : Not at all  Social Connections: Socially Isolated (11/24/2023)   Social Connection and Isolation Panel    Frequency of Communication with Friends and Family: More than three times a week    Frequency of Social  Gatherings with Friends and Family: More than three times a week    Attends Religious Services: Never    Database administrator or Organizations: No    Attends Banker Meetings: Never  Marital Status: Divorced   Review of Systems: Pertinent items are noted in HPI. Objective:   Vitals:   05/10/24 0843  BP: (!) 130/55  Pulse: 65  Temp: 98.2 F (36.8 C)  TempSrc: Oral  SpO2: 100%  Weight: 253 lb 9.6 oz (115 kg)  Height: 5' 3 (1.6 m)    Physical Exam: GEN: Well appearing, NAD. Oriented x 3, normal mood and affect  HEENT: Normocephalic, atraumatic, Conjunctiva clear, sclera non-icteric, no cervical LAD CV: RRR, no M/R/G PULM: CTAB GI: Bowel sounds normal, no TTP in all 4 quadrants MSK: enlarged LE with 2+ pitting edema b/l NEURO: moving all extremities spontaneously in upper and lower extremities. PSYCH: Oriented X3, intact recent and remote memory, judgment and insight, normal mood and affect.   Assessment & Plan:   Assessment & Plan Night sweats Unclear what is causing this. She is overall doing quite well and this may be in the setting of hormonal fluctuations, weather changes. However, she also does note some modest weight loss (albeit intentional) that is corroborated by our records and some fatigue/cold. Will check a CBC with diff and monitor these symptoms for worsening. Chronic heart failure with preserved ejection fraction (HFpEF) (HCC) TTE 03/2020 w/ LVEF 55-60%, Grade 1 diastolic dysfunction. Add Jardiance today.  Leg swelling Secondary to lymphedema and likely chronic diastolic heart failure. Continue Lasix  40 mg, add Jardiance, compression stockings, supportive care.  Obesity, Class III, BMI 40-49.9 (morbid obesity) (HCC) Has had some weight loss with diet and exercise. Unfortunately her functional/exercise capacity is hindered by her LE swelling. Will aim to get this under control. I do think she would benefit from an OSA evaluation for possible GLP-1  agonist as she does not have T2DM. Will discuss at next visit. Primary hypertension Switch amlodipine  to irbesartan. Chronic kidney disease, stage 3a (HCC) Check CMP today. Switch to amlodipine  to ARB, add Jardiance Encounter for immunization Flu shot today Need for shingles vaccine Shingrix ordered to pharmacy  Jone Dauphin MD

## 2024-05-10 NOTE — Assessment & Plan Note (Signed)
 Switch amlodipine  to irbesartan.

## 2024-05-10 NOTE — Assessment & Plan Note (Signed)
 Check CMP today. Switch to amlodipine  to ARB, add Jardiance

## 2024-05-10 NOTE — Assessment & Plan Note (Signed)
 TTE 03/2020 w/ LVEF 55-60%, Grade 1 diastolic dysfunction. Add Jardiance today.

## 2024-05-11 ENCOUNTER — Ambulatory Visit: Payer: Self-pay

## 2024-05-11 LAB — CBC WITH DIFFERENTIAL/PLATELET
Basophils Absolute: 0.1 x10E3/uL (ref 0.0–0.2)
Basos: 1 %
EOS (ABSOLUTE): 0.3 x10E3/uL (ref 0.0–0.4)
Eos: 5 %
Hematocrit: 39.8 % (ref 34.0–46.6)
Hemoglobin: 12.3 g/dL (ref 11.1–15.9)
Immature Grans (Abs): 0 x10E3/uL (ref 0.0–0.1)
Immature Granulocytes: 0 %
Lymphocytes Absolute: 1.8 x10E3/uL (ref 0.7–3.1)
Lymphs: 31 %
MCH: 31.5 pg (ref 26.6–33.0)
MCHC: 30.9 g/dL — ABNORMAL LOW (ref 31.5–35.7)
MCV: 102 fL — ABNORMAL HIGH (ref 79–97)
Monocytes Absolute: 0.5 x10E3/uL (ref 0.1–0.9)
Monocytes: 8 %
Neutrophils Absolute: 3.1 x10E3/uL (ref 1.4–7.0)
Neutrophils: 55 %
Platelets: 212 x10E3/uL (ref 150–450)
RBC: 3.9 x10E6/uL (ref 3.77–5.28)
RDW: 13 % (ref 11.7–15.4)
WBC: 5.7 x10E3/uL (ref 3.4–10.8)

## 2024-05-11 LAB — COMPREHENSIVE METABOLIC PANEL WITH GFR
ALT: 9 IU/L (ref 0–32)
AST: 20 IU/L (ref 0–40)
Albumin: 3.8 g/dL (ref 3.7–4.7)
Alkaline Phosphatase: 104 IU/L (ref 48–129)
BUN/Creatinine Ratio: 22 (ref 12–28)
BUN: 24 mg/dL (ref 8–27)
Bilirubin Total: 0.6 mg/dL (ref 0.0–1.2)
CO2: 25 mmol/L (ref 20–29)
Calcium: 10.5 mg/dL — ABNORMAL HIGH (ref 8.7–10.3)
Chloride: 101 mmol/L (ref 96–106)
Creatinine, Ser: 1.08 mg/dL — ABNORMAL HIGH (ref 0.57–1.00)
Globulin, Total: 2.7 g/dL (ref 1.5–4.5)
Glucose: 103 mg/dL — ABNORMAL HIGH (ref 70–99)
Potassium: 3.9 mmol/L (ref 3.5–5.2)
Sodium: 140 mmol/L (ref 134–144)
Total Protein: 6.5 g/dL (ref 6.0–8.5)
eGFR: 52 mL/min/1.73 — ABNORMAL LOW (ref >59)

## 2024-05-17 ENCOUNTER — Other Ambulatory Visit: Payer: Self-pay

## 2024-05-18 ENCOUNTER — Other Ambulatory Visit: Payer: Self-pay

## 2024-05-18 MED ORDER — AMLODIPINE BESYLATE 10 MG PO TABS: 10.0000 mg | ORAL_TABLET | Freq: Every day | ORAL | 0 refills | Status: DC

## 2024-05-18 NOTE — Telephone Encounter (Signed)
 Medication sent to pharmacy

## 2024-05-25 ENCOUNTER — Telehealth: Payer: Self-pay | Admitting: *Deleted

## 2024-05-25 ENCOUNTER — Other Ambulatory Visit: Payer: Self-pay

## 2024-05-25 MED ORDER — EMPAGLIFLOZIN 10 MG PO TABS: 10.0000 mg | ORAL_TABLET | Freq: Every day | ORAL | 3 refills | Status: DC

## 2024-05-25 MED ORDER — FAMOTIDINE 20 MG PO TABS: 20.0000 mg | ORAL_TABLET | Freq: Two times a day (BID) | ORAL | 1 refills | Status: DC

## 2024-05-25 MED ORDER — IRBESARTAN 75 MG PO TABS: 75.0000 mg | ORAL_TABLET | Freq: Every day | ORAL | 11 refills | Status: AC

## 2024-05-25 NOTE — Telephone Encounter (Signed)
 Will forward to PCP. Copied from CRM #[id number]. Topic: Clinical - Prescription Issue >> May 25, 2024  1:09 PM Cherylann RAMAN wrote: Reason for CRM: Patient is requesting irbesartan (AVAPRO) 75 MG table, empagliflozin (JARDIANCE) 10 MG TABS tablet, and famotidine (PEPCID) 20 MG tablet  to be sent to [facility]. As they were sent to the incorrect pharmacy. Please send medication to [facility] [place] [place] - [address] [place] [address] Phone: [phone] Fax: [phone] Hours: Not open 24 hours

## 2024-06-12 ENCOUNTER — Other Ambulatory Visit: Payer: Self-pay

## 2024-06-12 NOTE — Telephone Encounter (Signed)
 Medication sent to pharmacy

## 2024-06-26 ENCOUNTER — Other Ambulatory Visit: Payer: Self-pay

## 2024-06-26 ENCOUNTER — Encounter (HOSPITAL_COMMUNITY): Payer: Self-pay

## 2024-06-26 ENCOUNTER — Emergency Department (HOSPITAL_COMMUNITY)

## 2024-06-26 ENCOUNTER — Emergency Department (HOSPITAL_COMMUNITY)
Admission: EM | Admit: 2024-06-26 | Discharge: 2024-06-26 | Disposition: A | Discharge status: Home / Self Care | Attending: Emergency Medicine | Admitting: Emergency Medicine

## 2024-06-26 DIAGNOSIS — R600 Localized edema: Secondary | ICD-10-CM | POA: Insufficient documentation

## 2024-06-26 DIAGNOSIS — M7989 Other specified soft tissue disorders: Secondary | ICD-10-CM | POA: Diagnosis present

## 2024-06-26 LAB — COMPREHENSIVE METABOLIC PANEL WITH GFR
ALT: 10 U/L (ref 0–44)
AST: 18 U/L (ref 15–41)
Albumin: 3.2 g/dL — ABNORMAL LOW (ref 3.5–5.0)
Alkaline Phosphatase: 73 U/L (ref 38–126)
Anion gap: 10 (ref 5–15)
BUN: 31 mg/dL — ABNORMAL HIGH (ref 8–23)
CO2: 25 mmol/L (ref 22–32)
Calcium: 9.6 mg/dL (ref 8.9–10.3)
Chloride: 102 mmol/L (ref 98–111)
Creatinine, Ser: 1.25 mg/dL — ABNORMAL HIGH (ref 0.44–1.00)
GFR, Estimated: 43 mL/min — ABNORMAL LOW (ref >60)
Glucose, Bld: 96 mg/dL (ref 70–99)
Potassium: 3.6 mmol/L (ref 3.5–5.1)
Sodium: 137 mmol/L (ref 135–145)
Total Bilirubin: 0.7 mg/dL (ref 0.0–1.2)
Total Protein: 6.4 g/dL — ABNORMAL LOW (ref 6.5–8.1)

## 2024-06-26 LAB — CBC WITH DIFFERENTIAL/PLATELET
Abs Immature Granulocytes: 0.02 K/uL (ref 0.00–0.07)
Basophils Absolute: 0.1 K/uL (ref 0.0–0.1)
Basophils Relative: 1 %
Eosinophils Absolute: 0.3 K/uL (ref 0.0–0.5)
Eosinophils Relative: 5 %
HCT: 34.1 % — ABNORMAL LOW (ref 36.0–46.0)
Hemoglobin: 10.9 g/dL — ABNORMAL LOW (ref 12.0–15.0)
Immature Granulocytes: 0 %
Lymphocytes Relative: 38 %
Lymphs Abs: 2.3 K/uL (ref 0.7–4.0)
MCH: 31.1 pg (ref 26.0–34.0)
MCHC: 32 g/dL (ref 30.0–36.0)
MCV: 97.4 fL (ref 80.0–100.0)
Monocytes Absolute: 0.6 K/uL (ref 0.1–1.0)
Monocytes Relative: 10 %
Neutro Abs: 2.8 K/uL (ref 1.7–7.7)
Neutrophils Relative %: 46 %
Platelets: 242 K/uL (ref 150–400)
RBC: 3.5 MIL/uL — ABNORMAL LOW (ref 3.87–5.11)
RDW: 14.6 % (ref 11.5–15.5)
WBC: 6.1 K/uL (ref 4.0–10.5)
nRBC: 0 % (ref 0.0–0.2)

## 2024-06-26 LAB — BRAIN NATRIURETIC PEPTIDE: B Natriuretic Peptide: 33.3 pg/mL (ref 0.0–100.0)

## 2024-06-26 NOTE — ED Provider Notes (Signed)
 [facility] Provider Note   CSN: [id number] Arrival date & time: 06/26/24  9942     History Chief Complaint  Patient presents with   Leg Swelling    HPI Tina Chase is a 83 y.o. female presenting for chief complaint of bilateral lower extremity swelling acute on chronic.  States the pain has been present over 2 weeks but acutely worsening over the last 72 hours.  Has a history of DVT and is on Xarelto  at baseline.  Has been compliant with no missed doses. States her pain is completely absent when she is at rest when she gets up she starts getting severe cramping in her lower calves.  She is been compliant on her Lasix  but does endorse some decreased urine output this week..   Patient's recorded medical, surgical, social, medication list and allergies were reviewed in the Snapshot window as part of the initial history.   Review of Systems   Review of Systems  Constitutional:  Negative for chills and fever.  HENT:  Negative for ear pain and sore throat.   Eyes:  Negative for pain and visual disturbance.  Respiratory:  Negative for cough and shortness of breath.   Cardiovascular:  Positive for leg swelling. Negative for chest pain and palpitations.  Gastrointestinal:  Negative for abdominal pain and vomiting.  Genitourinary:  Negative for dysuria and hematuria.  Musculoskeletal:  Positive for gait problem. Negative for arthralgias and back pain.  Skin:  Negative for color change and rash.  Neurological:  Negative for seizures and syncope.  All other systems reviewed and are negative.   Physical Exam Updated Vital Signs BP 129/71   Pulse 81   Temp 98 F (36.7 C) (Oral)   Resp (!) 23   Ht 5' 3 (1.6 m)   Wt 112.5 kg   LMP 09/23/1986   SpO2 100%   BMI 43.93 kg/m  Physical Exam Vitals and nursing note reviewed.  Constitutional:      General: She is not in acute distress.    Appearance: She is well-developed. She is obese.   HENT:     Head: Normocephalic and atraumatic.  Eyes:     Conjunctiva/sclera: Conjunctivae normal.  Cardiovascular:     Rate and Rhythm: Normal rate and regular rhythm.     Heart sounds: No murmur heard. Pulmonary:     Effort: Pulmonary effort is normal. No respiratory distress.     Breath sounds: Normal breath sounds.  Abdominal:     General: There is no distension.     Palpations: Abdomen is soft.     Tenderness: There is no abdominal tenderness. There is no right CVA tenderness or left CVA tenderness.  Musculoskeletal:        General: No swelling or tenderness. Normal range of motion.     Cervical back: Neck supple.     Right lower leg: Edema present.     Left lower leg: Edema present.  Skin:    General: Skin is warm and dry.  Neurological:     General: No focal deficit present.     Mental Status: She is alert and oriented to person, place, and time. Mental status is at baseline.     Cranial Nerves: No cranial nerve deficit.      ED Course/ Medical Decision Making/ A&P    Procedures Procedures   Medications Ordered in ED Medications - No data to display  Medical Decision Making:   83 year old female presenting  with bilateral lower extremity swelling. Her history of present illness and physical exam findings are most consistent with either developing heart failure exacerbation or exacerbation of her chronic lymphedema.  No evidence of cellulitis on exam.  DVT would be on the differential but less likely given patient is already on treatment dose Xarelto . The bilateral nature seems less consistent with deep venous thrombosis as well. Will check lab work for underlying metabolic abnormality.  She may be overly diuresed and suffering from electrolyte disturbances. Alternatively, she may be under diuresed and need more aggressive management of her fluid status to help control her leg pain. Will follow-up lab work and reassess.  Reassessment: Lab work shows no focal  pathology.  Mild pulmonary edema is likely patient's baseline.  Lab work shows that she is relatively euvolemic.  I believe the pain is chronic in nature.  Discussed the possibility of a new DVT.  This is considered less likely given the timeline and lack of focal findings on my exam. Offered to have patient get an outpatient DVT study in the morning.  These are not available  overnight at this facility. Patient stated she would rather just follow-up with her PCP and be discharged. Given overall well appearance of believe this is reasonable.  Observed for 3-1/2 hours in the emergency room with no new focal symptoms or findings.  Ambulatory tolerating p.o. intake at time of discharge.    Clinical Impression:  1. Peripheral edema      Discharge   Final Clinical Impression(s) / ED Diagnoses Final diagnoses:  Peripheral edema    Rx / DC Orders ED Discharge Orders     None         Jerral Meth, MD 06/26/24 [phone]

## 2024-06-26 NOTE — ED Notes (Signed)
 Secretary notified of need for [facility] transport and RN provided all essential information.

## 2024-06-26 NOTE — ED Triage Notes (Signed)
 Pt from home for bilateral leg swelling.  Pt reports 0/10 pain when lying but 10/10 when standing.  Pt denies CP, SOB, /N /V /D.  Pt reports Hx of CHF and lymphedema, reports compliance with Lasix .

## 2024-07-07 ENCOUNTER — Other Ambulatory Visit: Payer: Self-pay

## 2024-07-10 ENCOUNTER — Inpatient Hospital Stay: Payer: Self-pay

## 2024-08-28 ENCOUNTER — Other Ambulatory Visit: Payer: Self-pay

## 2024-08-29 NOTE — Telephone Encounter (Signed)
 Medication sent to pharmacy

## 2024-08-30 ENCOUNTER — Inpatient Hospital Stay: Payer: Self-pay

## 2024-09-07 ENCOUNTER — Other Ambulatory Visit: Payer: Self-pay

## 2024-09-13 ENCOUNTER — Inpatient Hospital Stay

## 2024-11-29 ENCOUNTER — Ambulatory Visit
